# Patient Record
Sex: Male | Born: 1960 | Race: White | Hispanic: No | Marital: Single | Smoking: Former smoker
Health system: Southern US, Community
[De-identification: ages and names within clinical notes are randomized; demographics above are authoritative.]

## PROBLEM LIST (undated history)

## (undated) DIAGNOSIS — G629 Polyneuropathy, unspecified: Secondary | ICD-10-CM

## (undated) DIAGNOSIS — L219 Seborrheic dermatitis, unspecified: Secondary | ICD-10-CM

## (undated) DIAGNOSIS — L409 Psoriasis, unspecified: Secondary | ICD-10-CM

## (undated) DIAGNOSIS — I639 Cerebral infarction, unspecified: Secondary | ICD-10-CM

## (undated) DIAGNOSIS — F29 Unspecified psychosis not due to a substance or known physiological condition: Secondary | ICD-10-CM

## (undated) DIAGNOSIS — F329 Major depressive disorder, single episode, unspecified: Secondary | ICD-10-CM

## (undated) DIAGNOSIS — J45909 Unspecified asthma, uncomplicated: Secondary | ICD-10-CM

## (undated) DIAGNOSIS — F32A Depression, unspecified: Secondary | ICD-10-CM

## (undated) DIAGNOSIS — E11610 Type 2 diabetes mellitus with diabetic neuropathic arthropathy: Secondary | ICD-10-CM

## (undated) DIAGNOSIS — E785 Hyperlipidemia, unspecified: Secondary | ICD-10-CM

## (undated) DIAGNOSIS — E119 Type 2 diabetes mellitus without complications: Secondary | ICD-10-CM

## (undated) DIAGNOSIS — L309 Dermatitis, unspecified: Secondary | ICD-10-CM

## (undated) DIAGNOSIS — D649 Anemia, unspecified: Secondary | ICD-10-CM

## (undated) DIAGNOSIS — F319 Bipolar disorder, unspecified: Secondary | ICD-10-CM

## (undated) DIAGNOSIS — J9690 Respiratory failure, unspecified, unspecified whether with hypoxia or hypercapnia: Secondary | ICD-10-CM

## (undated) DIAGNOSIS — E039 Hypothyroidism, unspecified: Secondary | ICD-10-CM

## (undated) DIAGNOSIS — I10 Essential (primary) hypertension: Secondary | ICD-10-CM

## (undated) DIAGNOSIS — G4733 Obstructive sleep apnea (adult) (pediatric): Secondary | ICD-10-CM

## (undated) HISTORY — PX: TONSILLECTOMY: SUR1361

## (undated) HISTORY — PX: OTHER SURGICAL HISTORY: SHX169

---

## 2003-11-04 ENCOUNTER — Other Ambulatory Visit: Payer: Self-pay

## 2004-08-18 ENCOUNTER — Ambulatory Visit: Payer: Self-pay

## 2004-10-26 ENCOUNTER — Ambulatory Visit: Payer: Self-pay

## 2004-10-26 ENCOUNTER — Ambulatory Visit: Payer: Self-pay | Admitting: Psychiatry

## 2004-11-01 ENCOUNTER — Emergency Department: Payer: Self-pay | Admitting: Emergency Medicine

## 2004-11-12 ENCOUNTER — Ambulatory Visit: Payer: Self-pay

## 2004-11-16 ENCOUNTER — Emergency Department: Payer: Self-pay | Admitting: Emergency Medicine

## 2004-12-07 ENCOUNTER — Emergency Department: Payer: Self-pay | Admitting: General Practice

## 2005-01-26 ENCOUNTER — Ambulatory Visit: Payer: Self-pay | Admitting: Psychiatry

## 2005-02-03 ENCOUNTER — Emergency Department: Payer: Self-pay | Admitting: Unknown Physician Specialty

## 2005-09-13 ENCOUNTER — Ambulatory Visit: Payer: Self-pay | Admitting: Psychiatry

## 2006-02-21 ENCOUNTER — Ambulatory Visit: Payer: Self-pay | Admitting: Family Medicine

## 2006-03-07 ENCOUNTER — Ambulatory Visit: Payer: Self-pay | Admitting: Family Medicine

## 2006-03-10 ENCOUNTER — Other Ambulatory Visit: Payer: Self-pay

## 2006-03-10 ENCOUNTER — Inpatient Hospital Stay: Payer: Self-pay | Admitting: Internal Medicine

## 2006-04-18 ENCOUNTER — Emergency Department: Payer: Self-pay | Admitting: Emergency Medicine

## 2006-09-11 ENCOUNTER — Ambulatory Visit: Payer: Self-pay | Admitting: Internal Medicine

## 2006-09-16 ENCOUNTER — Ambulatory Visit: Payer: Self-pay | Admitting: Internal Medicine

## 2006-12-12 ENCOUNTER — Ambulatory Visit: Payer: Self-pay | Admitting: Internal Medicine

## 2006-12-16 ENCOUNTER — Ambulatory Visit: Payer: Self-pay | Admitting: Internal Medicine

## 2007-02-06 ENCOUNTER — Ambulatory Visit: Payer: Self-pay | Admitting: Internal Medicine

## 2007-02-15 ENCOUNTER — Ambulatory Visit: Payer: Self-pay | Admitting: Internal Medicine

## 2007-04-17 ENCOUNTER — Ambulatory Visit: Payer: Self-pay | Admitting: Internal Medicine

## 2007-04-24 ENCOUNTER — Ambulatory Visit: Payer: Self-pay | Admitting: Internal Medicine

## 2007-05-18 ENCOUNTER — Ambulatory Visit: Payer: Self-pay | Admitting: Internal Medicine

## 2007-06-08 ENCOUNTER — Ambulatory Visit: Payer: Self-pay | Admitting: Family Medicine

## 2007-06-18 ENCOUNTER — Ambulatory Visit: Payer: Self-pay | Admitting: Internal Medicine

## 2007-06-20 ENCOUNTER — Ambulatory Visit: Payer: Self-pay | Admitting: Internal Medicine

## 2007-07-18 ENCOUNTER — Ambulatory Visit: Payer: Self-pay | Admitting: Internal Medicine

## 2007-08-18 ENCOUNTER — Ambulatory Visit: Payer: Self-pay | Admitting: Internal Medicine

## 2007-09-17 ENCOUNTER — Ambulatory Visit: Payer: Self-pay | Admitting: Internal Medicine

## 2007-09-26 ENCOUNTER — Ambulatory Visit: Payer: Self-pay | Admitting: Internal Medicine

## 2007-10-18 ENCOUNTER — Ambulatory Visit: Payer: Self-pay | Admitting: Internal Medicine

## 2007-12-16 ENCOUNTER — Ambulatory Visit: Payer: Self-pay | Admitting: Internal Medicine

## 2007-12-24 ENCOUNTER — Ambulatory Visit: Payer: Self-pay | Admitting: Internal Medicine

## 2008-01-16 ENCOUNTER — Ambulatory Visit: Payer: Self-pay | Admitting: Internal Medicine

## 2008-02-15 ENCOUNTER — Ambulatory Visit: Payer: Self-pay | Admitting: Internal Medicine

## 2008-02-29 ENCOUNTER — Inpatient Hospital Stay: Payer: Self-pay | Admitting: Psychiatry

## 2008-05-08 ENCOUNTER — Ambulatory Visit: Payer: Self-pay | Admitting: Psychiatry

## 2008-05-17 ENCOUNTER — Ambulatory Visit: Payer: Self-pay | Admitting: Internal Medicine

## 2008-05-29 ENCOUNTER — Ambulatory Visit: Payer: Self-pay | Admitting: Psychiatry

## 2008-09-03 ENCOUNTER — Ambulatory Visit: Payer: Self-pay | Admitting: Psychiatry

## 2008-12-17 ENCOUNTER — Ambulatory Visit: Payer: Self-pay | Admitting: Family Medicine

## 2009-01-15 ENCOUNTER — Ambulatory Visit: Payer: Self-pay | Admitting: Family Medicine

## 2009-02-14 ENCOUNTER — Ambulatory Visit: Payer: Self-pay | Admitting: Family Medicine

## 2009-03-17 ENCOUNTER — Ambulatory Visit: Payer: Self-pay | Admitting: Family Medicine

## 2009-09-14 ENCOUNTER — Ambulatory Visit: Payer: Self-pay | Admitting: Internal Medicine

## 2010-09-07 ENCOUNTER — Ambulatory Visit: Payer: Self-pay | Admitting: Internal Medicine

## 2011-02-07 ENCOUNTER — Inpatient Hospital Stay: Payer: Self-pay | Admitting: Internal Medicine

## 2011-10-13 ENCOUNTER — Encounter (HOSPITAL_BASED_OUTPATIENT_CLINIC_OR_DEPARTMENT_OTHER): Payer: Medicare Other | Attending: Internal Medicine

## 2011-10-13 DIAGNOSIS — Z79899 Other long term (current) drug therapy: Secondary | ICD-10-CM | POA: Insufficient documentation

## 2011-10-13 DIAGNOSIS — L408 Other psoriasis: Secondary | ICD-10-CM | POA: Insufficient documentation

## 2011-10-13 DIAGNOSIS — I10 Essential (primary) hypertension: Secondary | ICD-10-CM | POA: Insufficient documentation

## 2011-10-13 DIAGNOSIS — J45909 Unspecified asthma, uncomplicated: Secondary | ICD-10-CM | POA: Insufficient documentation

## 2011-10-13 DIAGNOSIS — E039 Hypothyroidism, unspecified: Secondary | ICD-10-CM | POA: Insufficient documentation

## 2011-10-13 DIAGNOSIS — E1169 Type 2 diabetes mellitus with other specified complication: Secondary | ICD-10-CM | POA: Insufficient documentation

## 2011-10-13 DIAGNOSIS — I872 Venous insufficiency (chronic) (peripheral): Secondary | ICD-10-CM | POA: Insufficient documentation

## 2011-10-13 DIAGNOSIS — L97509 Non-pressure chronic ulcer of other part of unspecified foot with unspecified severity: Secondary | ICD-10-CM | POA: Insufficient documentation

## 2011-10-14 NOTE — Progress Notes (Signed)
[facility]  NAME:  Adrian [name], Adrian [name] NO.:  [id number]  MEDICAL RECORD NO.:  [id number]      DATE OF BIRTH:  [birth year]  PHYSICIAN:  Maxwell Caul, M.D.      VISIT DATE:                                  OFFICE VISIT   CHIEF COMPLAINT:  Review of wound on the left lateral foot.  This is a 49 year old man who is seen for a wound on the left lateral foot.  He states that this came about 3 weeks ago.  In fact, he lives with his mother who happened to notice the area in question.  He apparently was seen by his podiatrist and was put on Keflex, and he is just finishing this up although I do not have a culture result.  He has no prior history of wound-related issues.  He is a type 2 diabetic with a history of diabetic neuropathy.  PAST MEDICAL HISTORY: 1. Type 2 diabetes diagnosed in 2005. 2. Morbid obesity. 3. Hypertension. 4. History of venous insufficiency with venous insufficiency     ulcerations. 5. Seizure disorder. 6. Hypothyroidism. 7. Asthma.  SURGICAL HISTORY:  Tonsillectomy and adenoid removal.  MEDICATION LIST:  Reviewed.  He is currently taking: 1. Keflex 500 three times a day, which he has been on for 4 days. 2. He takes metformin a 1000 b.i.d. 3. K-Dur 20 twice a day. 4. Divalproex 500 b.i.d. 5. Micardis/hydrochlorothiazide 80/25 a day. 6. Geodon 80 mg 2 times a day. 7. Zetia 10 mg daily. 8. Prilosec 20 daily. 9. Torsemide 20 b.i.d. 10.Ambien CR 12.5 at bedtime. 11.Singulair 10 daily. 12.Albuterol 2 puffs q.4. 13.Advair 100/50 one puff twice a day. 14.Spiriva 1 puff daily. 15.ProAir HFA 2 puffs q.6 p.r.n. 16.Verapamil 200 at bedtime. 17.Synthroid 50 q.a.m.  SOCIAL HISTORY:  The patient lives at home with his mother.  He is ambulatory.  REVIEW OF SYSTEMS:  SKIN:  He has apparently widespread psoriasis, for which he is followed by Dermatology and recently has been referred to [facility].  He also has  apparent areas in his pannus including a blister.  RESPIRATORY:  He does not describe excessive shortness of breath.  CARDIAC:  No chest pain.  EXTREMITIES:  He does not describe claudication per se; however, he does have numbness and burning in his feet, which is probably neuropathic pain.  PHYSICAL EXAMINATION:  VITAL SIGNS:  His temperature is 98.7, pulse 125, respirations 22, blood pressure 149/71.  His blood glucose is 115. RESPIRATORY:  Very shallow air entry bilaterally.  However, there are no crackles or wheezes. CARDIAC:  Heart sounds distant.  No increase in JVP.  No sacral edema. EXTREMITIES:  He has severe venous stasis, probable lymphedema.  The edema extends into his feet.  Peripheral pulses were impossible to feel, although his calculated ABI was 0.8 done in this clinic.  WOUND EXAMINATION:  The wound in question is over his left lateral foot roughly mid way.  The wound was debrided off surrounding callus, eschar, and necrotic tissue.  Measurements were 0.8 x 0.5 x 0.2.  A culture of the area was done; however, I did not alter his current antibiotics, which were Keflex as noted above.  IMPRESSIONS:  Wagner's grade 2 diabetic foot wound on the  lateral aspect of his foot.  The area was debrided as noted above.  We applied a silver collagen under a Kerlix Coban wrap.  We have ordered arterial Dopplers of his left leg.  The area in question is a punched-out wound and has some suggestion that this might be an arterial insufficiency ulcer.  We have also cultured the wound and we will await those results before altering antibiotics.  The patient already had a healing sandal to offload this.  I will continue with this.  We will review him again in a week's time.          ______________________________ Maxwell Caul, M.D.     MGR/MEDQ  D:  10/13/2011  T:  10/13/2011  Job:  [id number]

## 2011-10-20 ENCOUNTER — Encounter (HOSPITAL_BASED_OUTPATIENT_CLINIC_OR_DEPARTMENT_OTHER): Payer: Medicare Other | Attending: Internal Medicine

## 2011-10-20 DIAGNOSIS — I10 Essential (primary) hypertension: Secondary | ICD-10-CM | POA: Insufficient documentation

## 2011-10-20 DIAGNOSIS — Z79899 Other long term (current) drug therapy: Secondary | ICD-10-CM | POA: Insufficient documentation

## 2011-10-20 DIAGNOSIS — L97509 Non-pressure chronic ulcer of other part of unspecified foot with unspecified severity: Secondary | ICD-10-CM | POA: Insufficient documentation

## 2011-10-20 DIAGNOSIS — E039 Hypothyroidism, unspecified: Secondary | ICD-10-CM | POA: Insufficient documentation

## 2011-10-20 DIAGNOSIS — E1169 Type 2 diabetes mellitus with other specified complication: Secondary | ICD-10-CM | POA: Insufficient documentation

## 2011-11-10 ENCOUNTER — Encounter (HOSPITAL_BASED_OUTPATIENT_CLINIC_OR_DEPARTMENT_OTHER): Payer: Medicare Other

## 2012-01-11 ENCOUNTER — Encounter: Payer: Self-pay | Admitting: Cardiothoracic Surgery

## 2012-01-11 ENCOUNTER — Encounter: Payer: Self-pay | Admitting: Nurse Practitioner

## 2012-01-16 ENCOUNTER — Encounter: Payer: Self-pay | Admitting: Cardiothoracic Surgery

## 2012-01-16 ENCOUNTER — Encounter: Payer: Self-pay | Admitting: Nurse Practitioner

## 2012-02-15 ENCOUNTER — Encounter: Payer: Self-pay | Admitting: Nurse Practitioner

## 2012-02-15 ENCOUNTER — Encounter: Payer: Self-pay | Admitting: Cardiothoracic Surgery

## 2012-03-17 ENCOUNTER — Encounter: Payer: Self-pay | Admitting: Cardiothoracic Surgery

## 2012-03-17 ENCOUNTER — Encounter: Payer: Self-pay | Admitting: Nurse Practitioner

## 2012-04-16 ENCOUNTER — Encounter: Payer: Self-pay | Admitting: Cardiothoracic Surgery

## 2012-04-16 ENCOUNTER — Encounter: Payer: Self-pay | Admitting: Nurse Practitioner

## 2012-05-12 ENCOUNTER — Emergency Department: Payer: Self-pay | Admitting: *Deleted

## 2012-05-17 ENCOUNTER — Encounter: Payer: Self-pay | Admitting: Nurse Practitioner

## 2012-05-17 ENCOUNTER — Encounter: Payer: Self-pay | Admitting: Cardiothoracic Surgery

## 2012-10-22 ENCOUNTER — Inpatient Hospital Stay: Payer: Self-pay | Admitting: Surgery

## 2012-10-22 LAB — COMPREHENSIVE METABOLIC PANEL
BUN: 13 mg/dL (ref 7–18)
Co2: 29 mmol/L (ref 21–32)
Creatinine: 0.9 mg/dL (ref 0.60–1.30)
Glucose: 100 mg/dL — ABNORMAL HIGH (ref 65–99)
SGOT(AST): 27 U/L (ref 15–37)
Total Protein: 7.1 g/dL (ref 6.4–8.2)

## 2012-10-22 LAB — CBC
HCT: 28.6 % — ABNORMAL LOW (ref 40.0–52.0)
HGB: 9.7 g/dL — ABNORMAL LOW (ref 13.0–18.0)
MCH: 29.8 pg (ref 26.0–34.0)
MCHC: 33.9 g/dL (ref 32.0–36.0)
MCV: 88 fL (ref 80–100)
WBC: 15.3 10*3/uL — ABNORMAL HIGH (ref 3.8–10.6)

## 2012-10-23 LAB — CBC WITH DIFFERENTIAL/PLATELET
Basophil #: 0 10*3/uL (ref 0.0–0.1)
Basophil %: 0.4 %
HCT: 26.7 % — ABNORMAL LOW (ref 40.0–52.0)
Lymphocyte #: 1.5 10*3/uL (ref 1.0–3.6)
Lymphocyte %: 12.7 %
MCH: 29.6 pg (ref 26.0–34.0)
MCHC: 34 g/dL (ref 32.0–36.0)
MCV: 87 fL (ref 80–100)
Monocyte %: 7.6 %
Neutrophil %: 78.4 %
Platelet: 472 10*3/uL — ABNORMAL HIGH (ref 150–440)
RBC: 3.07 10*6/uL — ABNORMAL LOW (ref 4.40–5.90)
RDW: 14.8 % — ABNORMAL HIGH (ref 11.5–14.5)
WBC: 12.2 10*3/uL — ABNORMAL HIGH (ref 3.8–10.6)

## 2012-10-23 LAB — BASIC METABOLIC PANEL
Anion Gap: 9 (ref 7–16)
BUN: 11 mg/dL (ref 7–18)
Calcium, Total: 8.7 mg/dL (ref 8.5–10.1)
Chloride: 104 mmol/L (ref 98–107)
EGFR (African American): >60
Glucose: 110 mg/dL — ABNORMAL HIGH (ref 65–99)
Osmolality: 283 (ref 275–301)
Potassium: 3.5 mmol/L (ref 3.5–5.1)
Sodium: 142 mmol/L (ref 136–145)

## 2012-10-27 LAB — CULTURE, BLOOD (SINGLE)

## 2012-10-27 LAB — WOUND CULTURE

## 2012-11-15 ENCOUNTER — Inpatient Hospital Stay: Payer: Self-pay | Admitting: Internal Medicine

## 2012-11-15 LAB — COMPREHENSIVE METABOLIC PANEL
Albumin: 2.7 g/dL — ABNORMAL LOW (ref 3.4–5.0)
Anion Gap: 19 — ABNORMAL HIGH (ref 7–16)
Bilirubin,Total: 0.7 mg/dL (ref 0.2–1.0)
Co2: 22 mmol/L (ref 21–32)
Creatinine: 1.84 mg/dL — ABNORMAL HIGH (ref 0.60–1.30)
EGFR (African American): 48 — ABNORMAL LOW
Glucose: 101 mg/dL — ABNORMAL HIGH (ref 65–99)
Osmolality: 256 (ref 275–301)
Potassium: 3.2 mmol/L — ABNORMAL LOW (ref 3.5–5.1)
SGPT (ALT): 72 U/L (ref 12–78)
Sodium: 124 mmol/L — ABNORMAL LOW (ref 136–145)

## 2012-11-15 LAB — MAGNESIUM: Magnesium: 1.1 mg/dL — ABNORMAL LOW

## 2012-11-15 LAB — CBC WITH DIFFERENTIAL/PLATELET
Basophil %: 0.1 %
Eosinophil #: 0 10*3/uL (ref 0.0–0.7)
HGB: 9.1 g/dL — ABNORMAL LOW (ref 13.0–18.0)
Lymphocyte %: 3.7 %
MCH: 27.6 pg (ref 26.0–34.0)
Monocyte %: 4 %
Neutrophil #: 23.8 10*3/uL — ABNORMAL HIGH (ref 1.4–6.5)
RBC: 3.31 10*6/uL — ABNORMAL LOW (ref 4.40–5.90)
RDW: 15.3 % — ABNORMAL HIGH (ref 11.5–14.5)
WBC: 25.8 10*3/uL — ABNORMAL HIGH (ref 3.8–10.6)

## 2012-11-15 LAB — CK: CK, Total: 4298 U/L — ABNORMAL HIGH (ref 35–232)

## 2012-11-16 LAB — CBC WITH DIFFERENTIAL/PLATELET
Basophil #: 0 10*3/uL (ref 0.0–0.1)
HCT: 26.7 % — ABNORMAL LOW (ref 40.0–52.0)
HGB: 9.1 g/dL — ABNORMAL LOW (ref 13.0–18.0)
MCH: 28.9 pg (ref 26.0–34.0)
MCHC: 33.9 g/dL (ref 32.0–36.0)
Monocyte %: 6.1 %
RBC: 3.14 10*6/uL — ABNORMAL LOW (ref 4.40–5.90)
RDW: 15 % — ABNORMAL HIGH (ref 11.5–14.5)
WBC: 12.3 10*3/uL — ABNORMAL HIGH (ref 3.8–10.6)

## 2012-11-16 LAB — URINALYSIS, COMPLETE
Bacteria: NONE SEEN
Leukocyte Esterase: NEGATIVE
RBC,UR: 6 /HPF (ref 0–5)
Specific Gravity: 1.005 (ref 1.003–1.030)
Squamous Epithelial: NONE SEEN

## 2012-11-16 LAB — HEPATIC FUNCTION PANEL A (ARMC)
Albumin: 2.3 g/dL — ABNORMAL LOW (ref 3.4–5.0)
Alkaline Phosphatase: 97 U/L (ref 50–136)
Bilirubin, Direct: 0.2 mg/dL (ref 0.00–0.20)
Bilirubin,Total: 0.4 mg/dL (ref 0.2–1.0)
SGOT(AST): 187 U/L — ABNORMAL HIGH (ref 15–37)

## 2012-11-16 LAB — MAGNESIUM: Magnesium: 2.1 mg/dL

## 2012-11-16 LAB — BASIC METABOLIC PANEL
Calcium, Total: 8.1 mg/dL — ABNORMAL LOW (ref 8.5–10.1)
Chloride: 94 mmol/L — ABNORMAL LOW (ref 98–107)
Creatinine: 0.98 mg/dL (ref 0.60–1.30)
EGFR (African American): >60
EGFR (Non-African Amer.): >60
Osmolality: 275 (ref 275–301)

## 2012-11-17 LAB — CBC WITH DIFFERENTIAL/PLATELET
Basophil %: 0.3 %
Eosinophil #: 0.1 10*3/uL (ref 0.0–0.7)
Eosinophil %: 0.5 %
Lymphocyte %: 10.6 %
MCHC: 32.2 g/dL (ref 32.0–36.0)
MCV: 87 fL (ref 80–100)
Monocyte #: 0.7 x10 3/mm (ref 0.2–1.0)
Monocyte %: 5.9 %
Neutrophil #: 9.6 10*3/uL — ABNORMAL HIGH (ref 1.4–6.5)
RDW: 15 % — ABNORMAL HIGH (ref 11.5–14.5)
WBC: 11.6 10*3/uL — ABNORMAL HIGH (ref 3.8–10.6)

## 2012-11-17 LAB — HEPATIC FUNCTION PANEL A (ARMC)
Albumin: 2 g/dL — ABNORMAL LOW (ref 3.4–5.0)
Bilirubin, Direct: <0.1 mg/dL (ref 0.00–0.20)
Bilirubin,Total: 0.3 mg/dL (ref 0.2–1.0)
SGOT(AST): 110 U/L — ABNORMAL HIGH (ref 15–37)
SGPT (ALT): 83 U/L — ABNORMAL HIGH (ref 12–78)

## 2012-11-17 LAB — CK: CK, Total: 824 U/L — ABNORMAL HIGH (ref 35–232)

## 2012-11-17 LAB — CREATININE, SERUM: Creatinine: 0.75 mg/dL (ref 0.60–1.30)

## 2012-11-17 LAB — POTASSIUM: Potassium: 3.5 mmol/L (ref 3.5–5.1)

## 2012-11-18 LAB — CBC WITH DIFFERENTIAL/PLATELET
Basophil #: 0 10*3/uL (ref 0.0–0.1)
Basophil %: 0.4 %
Eosinophil #: 0 10*3/uL (ref 0.0–0.7)
Eosinophil %: 0.3 %
HGB: 9.8 g/dL — ABNORMAL LOW (ref 13.0–18.0)
Lymphocyte #: 0.4 10*3/uL — ABNORMAL LOW (ref 1.0–3.6)
Lymphocyte %: 4 %
MCHC: 33.5 g/dL (ref 32.0–36.0)
MCV: 87 fL (ref 80–100)
Monocyte #: 0.3 x10 3/mm (ref 0.2–1.0)
Monocyte %: 3.4 %
Neutrophil %: 91.9 %
Platelet: 553 10*3/uL — ABNORMAL HIGH (ref 150–440)
RBC: 3.39 10*6/uL — ABNORMAL LOW (ref 4.40–5.90)
RDW: 15.8 % — ABNORMAL HIGH (ref 11.5–14.5)
WBC: 10.1 10*3/uL (ref 3.8–10.6)

## 2012-11-18 LAB — CREATININE, SERUM
EGFR (African American): >60
EGFR (Non-African Amer.): >60

## 2012-11-18 LAB — WOUND AEROBIC CULTURE

## 2012-11-19 LAB — CBC WITH DIFFERENTIAL/PLATELET
Basophil #: 0 10*3/uL (ref 0.0–0.1)
Basophil %: 0.5 %
Eosinophil %: 2.2 %
Lymphocyte #: 1.3 10*3/uL (ref 1.0–3.6)
Lymphocyte %: 17 %
MCH: 28.7 pg (ref 26.0–34.0)
Monocyte %: 9.6 %
Neutrophil #: 5.5 10*3/uL (ref 1.4–6.5)
Neutrophil %: 70.7 %
Platelet: 520 10*3/uL — ABNORMAL HIGH (ref 150–440)
RDW: 15.7 % — ABNORMAL HIGH (ref 11.5–14.5)

## 2012-11-20 LAB — CULTURE, BLOOD (SINGLE)

## 2013-03-14 ENCOUNTER — Inpatient Hospital Stay: Payer: Self-pay | Admitting: Internal Medicine

## 2013-03-14 LAB — URINALYSIS, COMPLETE
Bacteria: NONE SEEN
Glucose,UR: NEGATIVE mg/dL (ref 0–75)
Nitrite: NEGATIVE
Ph: 7 (ref 4.5–8.0)
Protein: NEGATIVE
Squamous Epithelial: NONE SEEN
WBC UR: 2 /HPF (ref 0–5)

## 2013-03-14 LAB — CBC
MCH: 29.6 pg (ref 26.0–34.0)
MCHC: 34.2 g/dL (ref 32.0–36.0)
Platelet: 596 10*3/uL — ABNORMAL HIGH (ref 150–440)
RBC: 3.35 10*6/uL — ABNORMAL LOW (ref 4.40–5.90)
RDW: 14.9 % — ABNORMAL HIGH (ref 11.5–14.5)

## 2013-03-14 LAB — COMPREHENSIVE METABOLIC PANEL
Albumin: 2.8 g/dL — ABNORMAL LOW (ref 3.4–5.0)
Alkaline Phosphatase: 89 U/L (ref 50–136)
Anion Gap: 12 (ref 7–16)
Chloride: 92 mmol/L — ABNORMAL LOW (ref 98–107)
Co2: 26 mmol/L (ref 21–32)
Creatinine: 1.01 mg/dL (ref 0.60–1.30)
EGFR (African American): >60
Potassium: 3.5 mmol/L (ref 3.5–5.1)
SGOT(AST): 25 U/L (ref 15–37)
SGPT (ALT): 38 U/L (ref 12–78)
Total Protein: 7.5 g/dL (ref 6.4–8.2)

## 2013-03-15 LAB — HEMOGLOBIN A1C: Hemoglobin A1C: <3.5 % — ABNORMAL LOW (ref 4.2–6.3)

## 2013-03-15 LAB — BASIC METABOLIC PANEL
Anion Gap: 6 — ABNORMAL LOW (ref 7–16)
Calcium, Total: 8.6 mg/dL (ref 8.5–10.1)
Chloride: 100 mmol/L (ref 98–107)
EGFR (African American): >60
Glucose: 100 mg/dL — ABNORMAL HIGH (ref 65–99)
Osmolality: 277 (ref 275–301)
Potassium: 3.4 mmol/L — ABNORMAL LOW (ref 3.5–5.1)

## 2013-03-15 LAB — CBC WITH DIFFERENTIAL/PLATELET
Basophil #: 0 10*3/uL (ref 0.0–0.1)
Eosinophil #: 0.3 10*3/uL (ref 0.0–0.7)
HCT: 25.7 % — ABNORMAL LOW (ref 40.0–52.0)
Lymphocyte #: 1.4 10*3/uL (ref 1.0–3.6)
MCH: 29.2 pg (ref 26.0–34.0)
MCHC: 34 g/dL (ref 32.0–36.0)
MCV: 86 fL (ref 80–100)
Monocyte %: 7.3 %
Neutrophil #: 10.5 10*3/uL — ABNORMAL HIGH (ref 1.4–6.5)
Neutrophil %: 79.4 %
RBC: 2.99 10*6/uL — ABNORMAL LOW (ref 4.40–5.90)
RDW: 15.1 % — ABNORMAL HIGH (ref 11.5–14.5)

## 2013-03-15 LAB — TSH: Thyroid Stimulating Horm: 3.83 u[IU]/mL

## 2013-03-15 LAB — VANCOMYCIN, TROUGH: Vancomycin, Trough: 12 ug/mL (ref 10–20)

## 2013-03-17 LAB — CBC WITH DIFFERENTIAL/PLATELET
Basophil #: 0 10*3/uL (ref 0.0–0.1)
Basophil %: 0.3 %
Eosinophil #: 0.4 10*3/uL (ref 0.0–0.7)
Eosinophil %: 2.8 %
Lymphocyte %: 12 %
MCH: 29.1 pg (ref 26.0–34.0)
Monocyte #: 0.7 x10 3/mm (ref 0.2–1.0)
Neutrophil #: 10.3 10*3/uL — ABNORMAL HIGH (ref 1.4–6.5)
Platelet: 526 10*3/uL — ABNORMAL HIGH (ref 150–440)
RBC: 3.19 10*6/uL — ABNORMAL LOW (ref 4.40–5.90)
RDW: 14.9 % — ABNORMAL HIGH (ref 11.5–14.5)

## 2013-06-12 ENCOUNTER — Inpatient Hospital Stay: Payer: Self-pay | Admitting: Internal Medicine

## 2013-06-12 LAB — COMPREHENSIVE METABOLIC PANEL
Albumin: 2.1 g/dL — ABNORMAL LOW (ref 3.4–5.0)
Alkaline Phosphatase: 146 U/L — ABNORMAL HIGH (ref 50–136)
Anion Gap: 12 (ref 7–16)
Chloride: 82 mmol/L — ABNORMAL LOW (ref 98–107)
Co2: 27 mmol/L (ref 21–32)
Creatinine: 1.33 mg/dL — ABNORMAL HIGH (ref 0.60–1.30)
EGFR (African American): >60
EGFR (Non-African Amer.): >60
Potassium: 2.7 mmol/L — ABNORMAL LOW (ref 3.5–5.1)
SGOT(AST): 281 U/L — ABNORMAL HIGH (ref 15–37)
SGPT (ALT): 355 U/L — ABNORMAL HIGH (ref 12–78)
Total Protein: 6.9 g/dL (ref 6.4–8.2)

## 2013-06-12 LAB — URINALYSIS, COMPLETE
Bilirubin,UR: NEGATIVE
Nitrite: NEGATIVE
Ph: 5 (ref 4.5–8.0)
Protein: 30
RBC,UR: <1 /HPF (ref 0–5)
WBC UR: 2 /HPF (ref 0–5)

## 2013-06-12 LAB — CBC
HGB: 9.7 g/dL — ABNORMAL LOW (ref 13.0–18.0)
MCH: 28.8 pg (ref 26.0–34.0)
MCHC: 34.9 g/dL (ref 32.0–36.0)
RBC: 3.36 10*6/uL — ABNORMAL LOW (ref 4.40–5.90)
WBC: 18.6 10*3/uL — ABNORMAL HIGH (ref 3.8–10.6)

## 2013-06-13 LAB — CBC WITH DIFFERENTIAL/PLATELET
HCT: 24.9 % — ABNORMAL LOW (ref 40.0–52.0)
HGB: 8.8 g/dL — ABNORMAL LOW (ref 13.0–18.0)
Lymphocyte %: 6.6 %
MCH: 28.7 pg (ref 26.0–34.0)
MCHC: 35.4 g/dL (ref 32.0–36.0)
MCV: 81 fL (ref 80–100)
Platelet: 507 10*3/uL — ABNORMAL HIGH (ref 150–440)
RDW: 14.4 % (ref 11.5–14.5)

## 2013-06-13 LAB — URINE CULTURE

## 2013-06-13 LAB — BASIC METABOLIC PANEL
Anion Gap: 12 (ref 7–16)
Chloride: 89 mmol/L — ABNORMAL LOW (ref 98–107)
Creatinine: 1.13 mg/dL (ref 0.60–1.30)
EGFR (African American): >60
Osmolality: 261 (ref 275–301)
Potassium: 3.1 mmol/L — ABNORMAL LOW (ref 3.5–5.1)

## 2013-06-13 LAB — MAGNESIUM: Magnesium: 1.2 mg/dL — ABNORMAL LOW

## 2013-06-13 LAB — HEPATIC FUNCTION PANEL A (ARMC)
Albumin: 2 g/dL — ABNORMAL LOW (ref 3.4–5.0)
Alkaline Phosphatase: 154 U/L — ABNORMAL HIGH (ref 50–136)
Bilirubin, Direct: 0.2 mg/dL (ref 0.00–0.20)
Bilirubin,Total: 0.5 mg/dL (ref 0.2–1.0)
SGPT (ALT): 305 U/L — ABNORMAL HIGH (ref 12–78)

## 2013-06-14 LAB — BASIC METABOLIC PANEL
Anion Gap: 7 (ref 7–16)
BUN: 16 mg/dL (ref 7–18)
Chloride: 98 mmol/L (ref 98–107)
Co2: 29 mmol/L (ref 21–32)
Creatinine: 0.92 mg/dL (ref 0.60–1.30)
Glucose: 111 mg/dL — ABNORMAL HIGH (ref 65–99)
Osmolality: 270 (ref 275–301)

## 2013-06-14 LAB — CBC WITH DIFFERENTIAL/PLATELET
Basophil %: 0.2 %
Eosinophil %: 0.7 %
HGB: 8.6 g/dL — ABNORMAL LOW (ref 13.0–18.0)
MCH: 28.1 pg (ref 26.0–34.0)
Monocyte #: 0.9 x10 3/mm (ref 0.2–1.0)
Neutrophil #: 12.2 10*3/uL — ABNORMAL HIGH (ref 1.4–6.5)
WBC: 14.5 10*3/uL — ABNORMAL HIGH (ref 3.8–10.6)

## 2013-06-14 LAB — VANCOMYCIN, TROUGH: Vancomycin, Trough: 13 ug/mL (ref 10–20)

## 2013-06-14 LAB — MAGNESIUM: Magnesium: 2 mg/dL

## 2013-06-15 LAB — CBC WITH DIFFERENTIAL/PLATELET
Basophil #: 0.2 10*3/uL — ABNORMAL HIGH (ref 0.0–0.1)
Basophil %: 1.2 %
Eosinophil %: 1.4 %
HCT: 27.5 % — ABNORMAL LOW (ref 40.0–52.0)
HGB: 9.2 g/dL — ABNORMAL LOW (ref 13.0–18.0)
Lymphocyte #: 1.9 10*3/uL (ref 1.0–3.6)
MCH: 28.5 pg (ref 26.0–34.0)
MCHC: 33.5 g/dL (ref 32.0–36.0)
MCV: 85 fL (ref 80–100)
Monocyte #: 0.9 x10 3/mm (ref 0.2–1.0)
Monocyte %: 5.5 %
Neutrophil #: 12.3 10*3/uL — ABNORMAL HIGH (ref 1.4–6.5)
RDW: 14.6 % — ABNORMAL HIGH (ref 11.5–14.5)

## 2013-06-15 LAB — COMPREHENSIVE METABOLIC PANEL
Anion Gap: 6 — ABNORMAL LOW (ref 7–16)
Chloride: 99 mmol/L (ref 98–107)
Creatinine: 0.9 mg/dL (ref 0.60–1.30)
EGFR (African American): >60
EGFR (Non-African Amer.): >60
Potassium: 3.8 mmol/L (ref 3.5–5.1)
SGOT(AST): 70 U/L — ABNORMAL HIGH (ref 15–37)
SGPT (ALT): 174 U/L — ABNORMAL HIGH (ref 12–78)
Total Protein: 7 g/dL (ref 6.4–8.2)

## 2013-06-16 LAB — CBC WITH DIFFERENTIAL/PLATELET
Basophil #: 0.1 10*3/uL (ref 0.0–0.1)
Basophil %: 0.5 %
Eosinophil %: 2.5 %
HGB: 8.5 g/dL — ABNORMAL LOW (ref 13.0–18.0)
Lymphocyte #: 1.7 10*3/uL (ref 1.0–3.6)
MCHC: 33.6 g/dL (ref 32.0–36.0)
MCV: 85 fL (ref 80–100)
Monocyte #: 0.7 x10 3/mm (ref 0.2–1.0)
Monocyte %: 5.1 %
Platelet: 583 10*3/uL — ABNORMAL HIGH (ref 150–440)
RBC: 2.97 10*6/uL — ABNORMAL LOW (ref 4.40–5.90)
RDW: 14.8 % — ABNORMAL HIGH (ref 11.5–14.5)
WBC: 14.4 10*3/uL — ABNORMAL HIGH (ref 3.8–10.6)

## 2013-06-17 LAB — CULTURE, BLOOD (SINGLE)

## 2013-10-04 ENCOUNTER — Inpatient Hospital Stay: Payer: Self-pay | Admitting: Internal Medicine

## 2013-10-04 LAB — CBC
HCT: 33.3 % — ABNORMAL LOW (ref 40.0–52.0)
HGB: 10.6 g/dL — ABNORMAL LOW (ref 13.0–18.0)
MCH: 26.1 pg (ref 26.0–34.0)
MCHC: 31.8 g/dL — ABNORMAL LOW (ref 32.0–36.0)
MCV: 82 fL (ref 80–100)
Platelet: 533 x10 3/mm 3 — ABNORMAL HIGH (ref 150–440)
RBC: 4.05 x10 6/mm 3 — ABNORMAL LOW (ref 4.40–5.90)
RDW: 14.9 % — ABNORMAL HIGH (ref 11.5–14.5)
WBC: 27.6 x10 3/mm 3 — ABNORMAL HIGH (ref 3.8–10.6)

## 2013-10-04 LAB — BASIC METABOLIC PANEL WITH GFR
Anion Gap: 13 (ref 7–16)
BUN: 28 mg/dL — ABNORMAL HIGH (ref 7–18)
Calcium, Total: 9.1 mg/dL (ref 8.5–10.1)
Chloride: 95 mmol/L — ABNORMAL LOW (ref 98–107)
Co2: 19 mmol/L — ABNORMAL LOW (ref 21–32)
Creatinine: 2.15 mg/dL — ABNORMAL HIGH (ref 0.60–1.30)
EGFR (African American): 40 — ABNORMAL LOW
EGFR (Non-African Amer.): 34 — ABNORMAL LOW
Glucose: 120 mg/dL — ABNORMAL HIGH (ref 65–99)
Osmolality: 262 (ref 275–301)
Potassium: 5 mmol/L (ref 3.5–5.1)
Sodium: 127 mmol/L — ABNORMAL LOW (ref 136–145)

## 2013-10-04 LAB — HEMOGLOBIN A1C: Hemoglobin A1C: 5.7 % (ref 4.2–6.3)

## 2013-10-04 LAB — HEPATIC FUNCTION PANEL A (ARMC)
Albumin: 3 g/dL — ABNORMAL LOW (ref 3.4–5.0)
Alkaline Phosphatase: 131 U/L — ABNORMAL HIGH
Bilirubin,Total: 0.4 mg/dL (ref 0.2–1.0)
SGOT(AST): 154 U/L — ABNORMAL HIGH (ref 15–37)
SGPT (ALT): 50 U/L (ref 12–78)
Total Protein: 8.2 g/dL (ref 6.4–8.2)

## 2013-10-04 LAB — URINALYSIS, COMPLETE
Bilirubin,UR: NEGATIVE
Glucose,UR: NEGATIVE mg/dL (ref 0–75)
Ketone: NEGATIVE
Leukocyte Esterase: NEGATIVE
Nitrite: NEGATIVE
Ph: 5 (ref 4.5–8.0)
Protein: 100
RBC,UR: 2 /HPF (ref 0–5)
Specific Gravity: 1.011 (ref 1.003–1.030)
Squamous Epithelial: NONE SEEN
WBC UR: 2 /HPF (ref 0–5)

## 2013-10-04 LAB — AMMONIA: Ammonia, Plasma: 29 umol/L (ref 11–32)

## 2013-10-05 DIAGNOSIS — I10 Essential (primary) hypertension: Secondary | ICD-10-CM | POA: Insufficient documentation

## 2013-10-05 DIAGNOSIS — M869 Osteomyelitis, unspecified: Secondary | ICD-10-CM | POA: Insufficient documentation

## 2013-10-05 DIAGNOSIS — E785 Hyperlipidemia, unspecified: Secondary | ICD-10-CM | POA: Insufficient documentation

## 2013-10-06 LAB — URINE CULTURE

## 2013-10-09 LAB — WOUND CULTURE

## 2013-10-09 LAB — CULTURE, BLOOD (SINGLE)

## 2013-10-10 LAB — WOUND AEROBIC CULTURE

## 2013-10-10 LAB — CULTURE, BLOOD (SINGLE)

## 2014-01-03 ENCOUNTER — Non-Acute Institutional Stay (SKILLED_NURSING_FACILITY): Payer: Medicare Other | Admitting: Internal Medicine

## 2014-01-03 ENCOUNTER — Other Ambulatory Visit: Payer: Self-pay | Admitting: *Deleted

## 2014-01-03 DIAGNOSIS — G4733 Obstructive sleep apnea (adult) (pediatric): Secondary | ICD-10-CM

## 2014-01-03 DIAGNOSIS — E1169 Type 2 diabetes mellitus with other specified complication: Secondary | ICD-10-CM

## 2014-01-03 DIAGNOSIS — L218 Other seborrheic dermatitis: Secondary | ICD-10-CM

## 2014-01-03 DIAGNOSIS — L219 Seborrheic dermatitis, unspecified: Secondary | ICD-10-CM

## 2014-01-03 DIAGNOSIS — M9080 Osteopathy in diseases classified elsewhere, unspecified site: Secondary | ICD-10-CM

## 2014-01-03 DIAGNOSIS — J45909 Unspecified asthma, uncomplicated: Secondary | ICD-10-CM

## 2014-01-03 DIAGNOSIS — M869 Osteomyelitis, unspecified: Secondary | ICD-10-CM

## 2014-01-03 DIAGNOSIS — E1140 Type 2 diabetes mellitus with diabetic neuropathy, unspecified: Secondary | ICD-10-CM | POA: Insufficient documentation

## 2014-01-03 DIAGNOSIS — E1149 Type 2 diabetes mellitus with other diabetic neurological complication: Secondary | ICD-10-CM

## 2014-01-03 DIAGNOSIS — F319 Bipolar disorder, unspecified: Secondary | ICD-10-CM

## 2014-01-03 DIAGNOSIS — E1142 Type 2 diabetes mellitus with diabetic polyneuropathy: Secondary | ICD-10-CM

## 2014-01-03 DIAGNOSIS — E039 Hypothyroidism, unspecified: Secondary | ICD-10-CM

## 2014-01-03 MED ORDER — CLONAZEPAM 0.5 MG PO TABS: ORAL_TABLET | ORAL | Status: DC

## 2014-01-03 MED ORDER — ZOLPIDEM TARTRATE 10 MG PO TABS: ORAL_TABLET | ORAL | Status: DC

## 2014-01-03 NOTE — Telephone Encounter (Signed)
[facility] 

## 2014-01-09 NOTE — Progress Notes (Signed)
Patient ID: Adrian [name], male   DOB: May 15, 1961, 53 y.o.   MRN: [id number]                   HISTORY & PHYSICAL  DATE:  01/03/2014     FACILITY: [facility]    LEVEL OF CARE:   SNF   CHIEF COMPLAINT:  Admission to SNF, post stay at [facility], October 09, 2013 through January 02, 2014.   Previous to this, according to the patient, he was at [facility] and [facility] for an overall set of hospitalizations starting in December 2014.    HISTORY OF PRESENT ILLNESS:  This man tells me that his problem was a left foot ulceration on the lateral aspect of his left foot, which he had been dealing with for several months prior to his admission to the hospital in December.  He was admitted to [facility] with what sounds like a complicated diabetic food wound with underlying osteomyelitis.  He was transferred to [facility] where, according to the patient, physicians recommended a below-knee amputation, which he refused.  He was then sent to [facility] in [place] for completion of antibiotics (exact antibiotics not specifically stated).  His foot wound  apparently completely closed over after six weeks of therapy, only to reopen again, requiring re-initiation of wound care.  He is starting to work with Physical Therapy but has done very little mobilization over the last several months.    Other medical issues include:  Supraventricular tachycardia, requiring initiation of metoprolol.    The patient is a diabetic, treated with metformin.    He also has sleep apnea and he had nocturnal CPAP at [facility].  His status here is uncertain.    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  See discussion above.    Type 2 diabetes.  On metformin.    Morbid obesity.    Asthma.    Hypertension.    Hyperlipidemia.    Supraventricular tachycardia.    Diabetic neuropathy.    Hypothyroidism.    Bipolar disorder.    Obstructive  sleep apnea.    CURRENT MEDICATIONS:  Medication list is reviewed.    SOCIAL HISTORY:   HOUSING:  The patient tells me he was living in [place] in his family home.   FUNCTIONAL STATUS:  He has a walker which he used to ambulate.  He does have a wheelchair, although the wheelchair was too big for the house, according to him.   TOBACCO USE:  He is a nonsmoker.    FAMILY HISTORY:  No history of diabetic foot wounds.    REVIEW OF SYSTEMS:   CHEST/RESPIRATORY:  He is not complaining of wheezing or shortness of breath.   CARDIAC:   No complaints of chest pain.   GI:  No bowel issues.  No abdominal pain.   GU:  Says he has urinary frequency, but no dysuria.   SKIN:  Extremities:  He does not have a history of diabetic foot wounds although he clarified that, but it is clear that this is the worst of his episodes.    PHYSICAL EXAMINATION:   VITAL SIGNS:   PULSE:  72.   RESPIRATIONS:  18.   GENERAL APPEARANCE:  Morbidly obese man in no distress.   HEENT:   MOUTH/THROAT:   Very poor dentition.  He will need orthodontic work here, but nothing seems acute.  CHEST/RESPIRATORY:  Clear air entry bilaterally.  CARDIOVASCULAR:  CARDIAC:   Heart sounds are distant.  There are no murmurs.  No evidence of heart failure.   GASTROINTESTINAL:  ABDOMEN:   Morbidly obese.  No masses or tenderness noted.   GENITOURINARY:  BLADDER:   No suprapubic fullness or tenderness, although this would be difficult to tell with his body habitus.     CIRCULATION:  EDEMA/VARICOSITIES:  Extremities:  He has some degree of venous stasis.   SKIN:  INSPECTION:  LEFT FOOT:  The area over his left lateral foot would have been at the base of his fifth metatarsal.  This has closed over.  However, the area does not look particularly vibrant to me.  The possibility here of continued osteomyelitis is there.  I have discussed this with him.  SCALP:  On his scalp, he has clear seborrheic dermatitis, maybe some evidence of scalp  psoriasis.   NEUROLOGICAL:    SENSATION/STRENGTH:  He does have antigravity strength.   DEEP TENDON REFLEXES:  He is diffusely hyporeflexic.   PSYCHIATRIC:   MENTAL STATUS:   I see no evidence of a particular problem here.  No bipolar issues at the moment.    ASSESSMENT/PLAN:  Diabetic osteomyelitis.  The wound has closed over with epithelialization complete.  However, this area will need watching.  He has already had one reoccurrence in [facility].  The possibility of continued osteomyelitis here is certainly there, even given the antibiotics he has received and the resultant closure of the original wound.    Diabetic neuropathy.    History of asthma.  I do not see any evidence of this at the bedside.    Supraventricular tachycardia.  Controlled on metoprolol.  His pulse rate seems regular and in sinus rhythm.    Hypertension.  We will need to monitor this while he is here.    Sleep apnea.  They gave him CPAP at [facility].  I will need to check if we have that here.    Bipolar disorder.  i see very little evidence of this.  He remains on Geodon and clonazepam.  I see no particular evidence that this is active at the moment.    We will continue to monitor his CBGs.    CPT CODE: 21308

## 2014-01-15 ENCOUNTER — Emergency Department (HOSPITAL_COMMUNITY): Payer: Medicare Other

## 2014-01-15 ENCOUNTER — Encounter (HOSPITAL_COMMUNITY): Payer: Self-pay | Admitting: Emergency Medicine

## 2014-01-15 ENCOUNTER — Inpatient Hospital Stay (HOSPITAL_COMMUNITY)
Admission: EM | Admit: 2014-01-15 | Discharge: 2014-01-17 | DRG: 690 | Disposition: A | Discharge status: Skilled Nursing Facility | Payer: Medicare Other | Attending: Internal Medicine | Admitting: Internal Medicine

## 2014-01-15 DIAGNOSIS — J45909 Unspecified asthma, uncomplicated: Secondary | ICD-10-CM | POA: Diagnosis present

## 2014-01-15 DIAGNOSIS — G988 Other disorders of nervous system: Secondary | ICD-10-CM | POA: Diagnosis present

## 2014-01-15 DIAGNOSIS — G4733 Obstructive sleep apnea (adult) (pediatric): Secondary | ICD-10-CM | POA: Diagnosis present

## 2014-01-15 DIAGNOSIS — L97509 Non-pressure chronic ulcer of other part of unspecified foot with unspecified severity: Secondary | ICD-10-CM | POA: Diagnosis present

## 2014-01-15 DIAGNOSIS — E785 Hyperlipidemia, unspecified: Secondary | ICD-10-CM | POA: Diagnosis present

## 2014-01-15 DIAGNOSIS — Z6841 Body Mass Index (BMI) 40.0 and over, adult: Secondary | ICD-10-CM

## 2014-01-15 DIAGNOSIS — Z79899 Other long term (current) drug therapy: Secondary | ICD-10-CM

## 2014-01-15 DIAGNOSIS — E669 Obesity, unspecified: Secondary | ICD-10-CM | POA: Diagnosis present

## 2014-01-15 DIAGNOSIS — R4182 Altered mental status, unspecified: Secondary | ICD-10-CM | POA: Diagnosis present

## 2014-01-15 DIAGNOSIS — E119 Type 2 diabetes mellitus without complications: Secondary | ICD-10-CM | POA: Diagnosis present

## 2014-01-15 DIAGNOSIS — N390 Urinary tract infection, site not specified: Principal | ICD-10-CM | POA: Diagnosis present

## 2014-01-15 DIAGNOSIS — F319 Bipolar disorder, unspecified: Secondary | ICD-10-CM | POA: Diagnosis present

## 2014-01-15 DIAGNOSIS — M869 Osteomyelitis, unspecified: Secondary | ICD-10-CM | POA: Diagnosis present

## 2014-01-15 DIAGNOSIS — I10 Essential (primary) hypertension: Secondary | ICD-10-CM | POA: Diagnosis present

## 2014-01-15 DIAGNOSIS — E039 Hypothyroidism, unspecified: Secondary | ICD-10-CM | POA: Diagnosis present

## 2014-01-15 HISTORY — DX: Essential (primary) hypertension: I10

## 2014-01-15 HISTORY — DX: Unspecified asthma, uncomplicated: J45.909

## 2014-01-15 HISTORY — DX: Type 2 diabetes mellitus without complications: E11.9

## 2014-01-15 HISTORY — DX: Major depressive disorder, single episode, unspecified: F32.9

## 2014-01-15 HISTORY — DX: Hyperlipidemia, unspecified: E78.5

## 2014-01-15 HISTORY — DX: Depression, unspecified: F32.A

## 2014-01-15 HISTORY — DX: Hypothyroidism, unspecified: E03.9

## 2014-01-15 HISTORY — DX: Obstructive sleep apnea (adult) (pediatric): G47.33

## 2014-01-15 HISTORY — DX: Bipolar disorder, unspecified: F31.9

## 2014-01-15 LAB — CBC WITH DIFFERENTIAL/PLATELET
BASOS ABS: 0 10*3/uL (ref 0.0–0.1)
Basophils Relative: 0 % (ref 0–1)
EOS ABS: 0 10*3/uL (ref 0.0–0.7)
Eosinophils Relative: 0 % (ref 0–5)
HEMATOCRIT: 30.3 % — AB (ref 39.0–52.0)
HEMOGLOBIN: 10.3 g/dL — AB (ref 13.0–17.0)
Lymphocytes Relative: 18 % (ref 12–46)
Lymphs Abs: 2.5 10*3/uL (ref 0.7–4.0)
MCH: 29.7 pg (ref 26.0–34.0)
MCHC: 34 g/dL (ref 30.0–36.0)
MCV: 87.3 fL (ref 78.0–100.0)
MONO ABS: 1.8 10*3/uL — AB (ref 0.1–1.0)
MONOS PCT: 13 % — AB (ref 3–12)
Neutro Abs: 9.7 10*3/uL — ABNORMAL HIGH (ref 1.7–7.7)
Neutrophils Relative %: 69 % (ref 43–77)
Platelets: 249 10*3/uL (ref 150–400)
RBC: 3.47 MIL/uL — ABNORMAL LOW (ref 4.22–5.81)
RDW: 15.8 % — ABNORMAL HIGH (ref 11.5–15.5)
WBC: 14 10*3/uL — ABNORMAL HIGH (ref 4.0–10.5)

## 2014-01-15 NOTE — ED Provider Notes (Signed)
CSN: [id number]     Arrival date & time 01/15/14  2241 History   First MD Initiated Contact with Patient 01/15/14 2305     Chief Complaint  Patient presents with  . Altered Mental Status     (Consider location/radiation/quality/duration/timing/severity/associated sxs/prior Treatment) HPI Comments: Patient is a 53 year old male with history of diabetes, obesity, bipolar disorder. He was sent here from [facility] for evaluation of fever. He has been there for several weeks for treatment of a foot ulcer. This appears to be improving. Today staff noted him to be less responsive than normal and apparently running a fever of 102. He was given Tylenol and sent here for further evaluation. He denies to me he is having any symptoms.  Patient is a 53 y.o. male presenting with altered mental status. The history is provided by the patient.  Altered Mental Status Presenting symptoms: lethargy and partial responsiveness   Severity:  Moderate Most recent episode:  Today Episode history:  Single Timing:  Constant Progression:  Unchanged Chronicity:  New Associated symptoms: fever     Past Medical History  Diagnosis Date  . Asthma   . Hypertension   . Hyperlipemia   . Diabetes mellitus without complication   . Hypothyroidism   . Bipolar disorder   . Sleep apnea, obstructive    Past Surgical History  Procedure Laterality Date  . Tonsillectomy     No family history on file. History  Substance Use Topics  . Smoking status: Never Smoker   . Smokeless tobacco: Not on file  . Alcohol Use: No    Review of Systems  Constitutional: Positive for fever and fatigue.  All other systems reviewed and are negative.      Allergies  Review of patient's allergies indicates not on file.  Home Medications   Current Outpatient Rx  Name  Route  Sig  Dispense  Refill  . clonazePAM (KLONOPIN) 0.5 MG tablet      Take one tablet by mouth twice daily   60 tablet   5   . zolpidem (AMBIEN)  10 MG tablet      Take one tablet by mouth every night at bedtime as needed for rest   30 tablet   5    BP 140/67  Pulse 85  Temp(Src) 98.6 F (37 C) (Oral)  Resp 18  Ht 5\' 11"  (1.803 m)  Wt 341 lb (154.677 kg)  BMI 47.58 kg/m2  SpO2 96% Physical Exam  Nursing note and vitals reviewed. Constitutional: He is oriented to person, place, and time.  Patient is an obese, chronically ill-appearing 53 year old male. He is somnolent, however is arousable and appropriate.  HENT:  Head: Normocephalic and atraumatic.  Mouth/Throat: Oropharynx is clear and moist.  Eyes: EOM are normal. Pupils are equal, round, and reactive to light.  Neck: Normal range of motion. Neck supple.  Cardiovascular: Normal rate, regular rhythm and normal heart sounds.   No murmur heard. Pulmonary/Chest: Effort normal and breath sounds normal. No respiratory distress. He has no wheezes.  Abdominal: Soft. Bowel sounds are normal. He exhibits no distension and no mass. There is no tenderness.  Abdomen is obese.  Musculoskeletal: Normal range of motion. He exhibits no edema.  The left foot is noted to have a healing ulcer to the lateral aspect. There is no surrounding erythema or warmth.  Neurological: He is alert and oriented to person, place, and time. No cranial nerve deficit. Coordination normal.  Skin: Skin is warm and dry.  ED Course  Procedures (including critical care time) Labs Review Labs Reviewed  CULTURE, BLOOD (ROUTINE X 2)  CULTURE, BLOOD (ROUTINE X 2)  CBC WITH DIFFERENTIAL  COMPREHENSIVE METABOLIC PANEL  URINALYSIS, ROUTINE W REFLEX MICROSCOPIC   Imaging Review No results found.   EKG Interpretation None      MDM   Final diagnoses:  None    Patient is a 53 year old male with history of obesity, diabetes. He was sent here for evaluation of altered mental status and fever. He's been quite somnolent today and was found to have a fever of 102. Workup today reveals an elevated white  count and urinary tract infection. I suspect this is the etiology behind his symptoms. Blood cultures were obtained and are pending. Chest x-ray was performed and did not show any acute process. He was given ceftriaxone and will be admitted to the internal medicine service. Dr. Julian Reil agrees to admit.    Geoffery Lyons, MD 01/16/14 [phone]

## 2014-01-15 NOTE — ED Notes (Signed)
Phlebotomy at bedside.

## 2014-01-15 NOTE — ED Notes (Signed)
Patient presents to ED via [facility]. Patient is from [facility] where staff called c/o patient having "altered mental status and fever." Upon EMS arrival patient was c/o of being "hot." Staff at [facility] had given patient "tylenol at 8pm." Pt is currently A&Ox4. No complaints at this time.

## 2014-01-15 NOTE — ED Notes (Signed)
Dr. Delo at bedside. 

## 2014-01-16 ENCOUNTER — Encounter (HOSPITAL_COMMUNITY): Payer: Self-pay | Admitting: General Practice

## 2014-01-16 DIAGNOSIS — N390 Urinary tract infection, site not specified: Principal | ICD-10-CM | POA: Diagnosis present

## 2014-01-16 DIAGNOSIS — R4182 Altered mental status, unspecified: Secondary | ICD-10-CM

## 2014-01-16 LAB — URINALYSIS, ROUTINE W REFLEX MICROSCOPIC
Bilirubin Urine: NEGATIVE
GLUCOSE, UA: NEGATIVE mg/dL
Ketones, ur: NEGATIVE mg/dL
Nitrite: POSITIVE — AB
PH: 5.5 (ref 5.0–8.0)
PROTEIN: 30 mg/dL — AB
Specific Gravity, Urine: 1.013 (ref 1.005–1.030)
Urobilinogen, UA: 0.2 mg/dL (ref 0.0–1.0)

## 2014-01-16 LAB — COMPREHENSIVE METABOLIC PANEL
ALT: 21 U/L (ref 0–53)
AST: 19 U/L (ref 0–37)
Albumin: 2.9 g/dL — ABNORMAL LOW (ref 3.5–5.2)
Alkaline Phosphatase: 111 U/L (ref 39–117)
BUN: 14 mg/dL (ref 6–23)
CALCIUM: 8.2 mg/dL — AB (ref 8.4–10.5)
CO2: 22 mEq/L (ref 19–32)
Chloride: 99 mEq/L (ref 96–112)
Creatinine, Ser: 1.26 mg/dL (ref 0.50–1.35)
GFR calc Af Amer: 74 mL/min — ABNORMAL LOW (ref >90)
GFR calc non Af Amer: 64 mL/min — ABNORMAL LOW (ref >90)
Glucose, Bld: 115 mg/dL — ABNORMAL HIGH (ref 70–99)
Potassium: 2.9 mEq/L — CL (ref 3.7–5.3)
Sodium: 140 mEq/L (ref 137–147)
TOTAL PROTEIN: 6.1 g/dL (ref 6.0–8.3)
Total Bilirubin: 0.2 mg/dL — ABNORMAL LOW (ref 0.3–1.2)

## 2014-01-16 LAB — MRSA PCR SCREENING: MRSA BY PCR: POSITIVE — AB

## 2014-01-16 LAB — URINE MICROSCOPIC-ADD ON

## 2014-01-16 LAB — GLUCOSE, CAPILLARY
GLUCOSE-CAPILLARY: 79 mg/dL (ref 70–99)
GLUCOSE-CAPILLARY: 110 mg/dL — AB (ref 70–99)
GLUCOSE-CAPILLARY: 107 mg/dL — AB (ref 70–99)
GLUCOSE-CAPILLARY: 99 mg/dL (ref 70–99)
Glucose-Capillary: 105 mg/dL — ABNORMAL HIGH (ref 70–99)

## 2014-01-16 MED ORDER — VERAPAMIL HCL ER 120 MG PO TBCR
120.0000 mg | EXTENDED_RELEASE_TABLET | Freq: Every day | ORAL | Status: DC
  Administered 2014-01-16: 120 mg via ORAL
  Filled 2014-01-16 (×2): qty 1

## 2014-01-16 MED ORDER — POTASSIUM CHLORIDE CRYS ER 20 MEQ PO TBCR
40.0000 meq | EXTENDED_RELEASE_TABLET | Freq: Once | ORAL | Status: AC
  Administered 2014-01-16: 40 meq via ORAL
  Filled 2014-01-16: qty 2

## 2014-01-16 MED ORDER — OXCARBAZEPINE 300 MG PO TABS
600.0000 mg | ORAL_TABLET | Freq: Two times a day (BID) | ORAL | Status: DC
  Administered 2014-01-16 – 2014-01-17 (×3): 600 mg via ORAL
  Filled 2014-01-16 (×4): qty 2

## 2014-01-16 MED ORDER — GABAPENTIN 300 MG PO CAPS
300.0000 mg | ORAL_CAPSULE | Freq: Four times a day (QID) | ORAL | Status: DC
  Administered 2014-01-16 – 2014-01-17 (×6): 300 mg via ORAL
  Filled 2014-01-16 (×8): qty 1

## 2014-01-16 MED ORDER — MOMETASONE FURO-FORMOTEROL FUM 100-5 MCG/ACT IN AERO
2.0000 | INHALATION_SPRAY | Freq: Two times a day (BID) | RESPIRATORY_TRACT | Status: DC
  Administered 2014-01-16 – 2014-01-17 (×3): 2 via RESPIRATORY_TRACT
  Filled 2014-01-16: qty 8.8

## 2014-01-16 MED ORDER — BIOTENE DRY MOUTH MT LIQD
15.0000 mL | Freq: Two times a day (BID) | OROMUCOSAL | Status: DC
  Administered 2014-01-16 – 2014-01-17 (×3): 15 mL via OROMUCOSAL

## 2014-01-16 MED ORDER — ENOXAPARIN SODIUM 40 MG/0.4ML ~~LOC~~ SOLN
40.0000 mg | SUBCUTANEOUS | Status: DC
  Administered 2014-01-16 – 2014-01-17 (×2): 40 mg via SUBCUTANEOUS
  Filled 2014-01-16 (×2): qty 0.4

## 2014-01-16 MED ORDER — MUPIROCIN 2 % EX OINT
TOPICAL_OINTMENT | Freq: Two times a day (BID) | CUTANEOUS | Status: DC
  Administered 2014-01-16: 1 via NASAL
  Administered 2014-01-16 – 2014-01-17 (×2): via NASAL
  Filled 2014-01-16: qty 22

## 2014-01-16 MED ORDER — MAGNESIUM HYDROXIDE 400 MG/5ML PO SUSP
30.0000 mL | Freq: Every day | ORAL | Status: DC
  Administered 2014-01-16 – 2014-01-17 (×2): 30 mL via ORAL
  Filled 2014-01-16 (×2): qty 30

## 2014-01-16 MED ORDER — MONTELUKAST SODIUM 10 MG PO TABS
10.0000 mg | ORAL_TABLET | Freq: Every day | ORAL | Status: DC
  Administered 2014-01-16 – 2014-01-17 (×2): 10 mg via ORAL
  Filled 2014-01-16 (×2): qty 1

## 2014-01-16 MED ORDER — ZOLPIDEM TARTRATE 5 MG PO TABS
5.0000 mg | ORAL_TABLET | Freq: Once | ORAL | Status: AC
  Administered 2014-01-16: 5 mg via ORAL
  Filled 2014-01-16: qty 1

## 2014-01-16 MED ORDER — DEXTROSE 5 % IV SOLN
1.0000 g | INTRAVENOUS | Status: DC
  Administered 2014-01-17: 1 g via INTRAVENOUS
  Filled 2014-01-16 (×2): qty 10

## 2014-01-16 MED ORDER — ZIPRASIDONE HCL 60 MG PO CAPS
60.0000 mg | ORAL_CAPSULE | Freq: Two times a day (BID) | ORAL | Status: DC
  Administered 2014-01-16 – 2014-01-17 (×3): 60 mg via ORAL
  Filled 2014-01-16 (×5): qty 1

## 2014-01-16 MED ORDER — LEVOTHYROXINE SODIUM 75 MCG PO TABS
75.0000 ug | ORAL_TABLET | Freq: Every day | ORAL | Status: DC
  Administered 2014-01-16 – 2014-01-17 (×2): 75 ug via ORAL
  Filled 2014-01-16 (×3): qty 1

## 2014-01-16 MED ORDER — TORSEMIDE 20 MG PO TABS
20.0000 mg | ORAL_TABLET | Freq: Every day | ORAL | Status: DC
  Administered 2014-01-16 – 2014-01-17 (×2): 20 mg via ORAL
  Filled 2014-01-16 (×2): qty 1

## 2014-01-16 MED ORDER — ACETAMINOPHEN 325 MG PO TABS: 650.0000 mg | ORAL_TABLET | Freq: Four times a day (QID) | ORAL | Status: DC | PRN

## 2014-01-16 MED ORDER — INSULIN ASPART 100 UNIT/ML ~~LOC~~ SOLN: 0.0000 [IU] | Freq: Three times a day (TID) | SUBCUTANEOUS | Status: DC

## 2014-01-16 MED ORDER — TIOTROPIUM BROMIDE MONOHYDRATE 18 MCG IN CAPS
18.0000 ug | ORAL_CAPSULE | Freq: Every day | RESPIRATORY_TRACT | Status: DC
  Administered 2014-01-16 – 2014-01-17 (×2): 18 ug via RESPIRATORY_TRACT
  Filled 2014-01-16: qty 5

## 2014-01-16 MED ORDER — DEXTROSE 5 % IV SOLN
1.0000 g | Freq: Once | INTRAVENOUS | Status: AC
  Administered 2014-01-16: 1 g via INTRAVENOUS
  Filled 2014-01-16: qty 10

## 2014-01-16 MED ORDER — CLONAZEPAM 0.5 MG PO TABS
0.5000 mg | ORAL_TABLET | Freq: Two times a day (BID) | ORAL | Status: DC
  Administered 2014-01-16 – 2014-01-17 (×3): 0.5 mg via ORAL
  Filled 2014-01-16 (×3): qty 1

## 2014-01-16 MED ORDER — METOPROLOL TARTRATE 25 MG PO TABS
25.0000 mg | ORAL_TABLET | Freq: Two times a day (BID) | ORAL | Status: DC
  Administered 2014-01-16 – 2014-01-17 (×3): 25 mg via ORAL
  Filled 2014-01-16 (×5): qty 1

## 2014-01-16 MED ORDER — CHLORHEXIDINE GLUCONATE CLOTH 2 % EX PADS
6.0000 | MEDICATED_PAD | Freq: Every day | CUTANEOUS | Status: DC
  Administered 2014-01-16 – 2014-01-17 (×2): 6 via TOPICAL

## 2014-01-16 MED ORDER — ALUM & MAG HYDROXIDE-SIMETH 200-200-20 MG/5ML PO SUSP: 30.0000 mL | ORAL | Status: DC | PRN

## 2014-01-16 MED ORDER — SENNA-DOCUSATE SODIUM 8.6-50 MG PO TABS
1.0000 | ORAL_TABLET | Freq: Two times a day (BID) | ORAL | Status: DC
  Administered 2014-01-16 – 2014-01-17 (×3): 1 via ORAL
  Filled 2014-01-16 (×5): qty 1

## 2014-01-16 MED ORDER — METFORMIN HCL 500 MG PO TABS
1000.0000 mg | ORAL_TABLET | Freq: Two times a day (BID) | ORAL | Status: DC
  Administered 2014-01-16 – 2014-01-17 (×3): 1000 mg via ORAL
  Filled 2014-01-16 (×5): qty 2

## 2014-01-16 MED ORDER — POTASSIUM CHLORIDE 10 MEQ/100ML IV SOLN
10.0000 meq | Freq: Once | INTRAVENOUS | Status: AC
  Administered 2014-01-16: 10 meq via INTRAVENOUS
  Filled 2014-01-16: qty 100

## 2014-01-16 NOTE — ED Notes (Signed)
Admitting physician at bedside

## 2014-01-16 NOTE — Progress Notes (Signed)
Patient seen earlier today by my colleague Dr. Julian ReilGardner.  Patient seen examined by me, notes and data base reviewed. 52 years staying at nursing home resident because of recent lower extremity osteomyelitis Presented with altered mental status likely secondary to UTI. Patient is on Rocephin, also needs potassium replacement.  Adrian [name] Pager: [phone]: [phone] 01/16/2014, 11:51 AM

## 2014-01-16 NOTE — Progress Notes (Signed)
Received report from Holly, RN.

## 2014-01-16 NOTE — ED Notes (Signed)
[facility] is requesting a bariatric bed at this time. Leonard Schwartzramaine, RN on [facility] will call this RN when able to transport patient upstairs.

## 2014-01-16 NOTE — ED Notes (Signed)
Dr. Delo at bedside. 

## 2014-01-16 NOTE — ED Notes (Signed)
[facility] called stating that the bed was ready. Transporting patient to [facility] at this time.

## 2014-01-16 NOTE — Progress Notes (Signed)
UR completed. Patient changed to inpatient- requiring IV antibiotics.  

## 2014-01-16 NOTE — H&P (Signed)
[facility] History and Physical  Jacki ConesCarlton Kesselman WUJ:[id number] DOB: 1961/04/23 DOA: 01/15/2014  Referring physician: EDP PCP: Dennison MascotMORRISEY,LEMONT, MD   Chief Complaint: AMS, fever   HPI: Jacki ConesCarlton Buttacavoli is a 53 y.o. male who is sent in from his NH at [facility] for new onset fever today as well as altered mental status.  Fever of 102 noted per NH report, patient was noted to be "less responsive" per staff.  On arrival to the ED, patient is sleepy, but with enough stimulation, wakes up, is AAOx4 (although somewhat angry about being woken up), and states that he has no symptoms, before promptly going back to sleep (or just ignoring me).  Review of Systems: Systems reviewed.  As above, otherwise negative  Past Medical History  Diagnosis Date  . Asthma   . Hypertension   . Hyperlipemia   . Diabetes mellitus without complication   . Hypothyroidism   . Bipolar disorder   . Sleep apnea, obstructive    Past Surgical History  Procedure Laterality Date  . Tonsillectomy     Social History:  reports that he has never smoked. He does not have any smokeless tobacco history on file. He reports that he does not drink alcohol or use illicit drugs.  No Known Allergies  No family history on file.   Prior to Admission medications   Medication Sig Start Date End Date Taking? Authorizing Provider  acetaminophen (TYLENOL) 325 MG tablet Take 650 mg by mouth every 6 (six) hours as needed for mild pain.   Yes Historical Provider, MD  alum & mag hydroxide-simeth (MAALOX/MYLANTA) 200-200-20 MG/5ML suspension Take 30 mLs by mouth every 4 (four) hours as needed for indigestion or heartburn.   Yes Historical Provider, MD  clonazePAM (KLONOPIN) 0.5 MG tablet Take one tablet by mouth twice daily 01/03/14  Yes Tiffany L Reed, DO  enoxaparin (LOVENOX) 40 MG/0.4ML injection Inject 40 mg into the skin daily.   Yes Historical Provider, MD  Fluticasone-Salmeterol (ADVAIR) 250-50 MCG/DOSE AEPB Inhale 1 puff into  the lungs 2 (two) times daily.   Yes Historical Provider, MD  gabapentin (NEURONTIN) 300 MG capsule Take 300 mg by mouth 4 (four) times daily.   Yes Historical Provider, MD  guaifenesin (ROBITUSSIN) 100 MG/5ML syrup Take 200 mg by mouth 3 (three) times daily as needed for cough.   Yes Historical Provider, MD  levothyroxine (SYNTHROID, LEVOTHROID) 75 MCG tablet Take 75 mcg by mouth daily before breakfast.   Yes Historical Provider, MD  loratadine (CLARITIN) 10 MG tablet Take 10 mg by mouth daily.   Yes Historical Provider, MD  magnesium hydroxide (MILK OF MAGNESIA) 400 MG/5ML suspension Take 30 mLs by mouth daily.   Yes Historical Provider, MD  metFORMIN (GLUCOPHAGE) 1000 MG tablet Take 1,000 mg by mouth 2 (two) times daily with a meal.   Yes Historical Provider, MD  metoprolol tartrate (LOPRESSOR) 25 MG tablet Take 25 mg by mouth 2 (two) times daily.   Yes Historical Provider, MD  montelukast (SINGULAIR) 10 MG tablet Take 10 mg by mouth daily.   Yes Historical Provider, MD  oxcarbazepine (TRILEPTAL) 600 MG tablet Take 600 mg by mouth 2 (two) times daily.   Yes Historical Provider, MD  sennosides-docusate sodium (SENOKOT-S) 8.6-50 MG tablet Take 1 tablet by mouth 2 (two) times daily.   Yes Historical Provider, MD  simethicone (MYLICON) 125 MG chewable tablet Chew 125 mg by mouth every 6 (six) hours as needed for flatulence.   Yes Historical Provider, MD  tiotropium (  SPIRIVA) 18 MCG inhalation capsule Place 18 mcg into inhaler and inhale daily.   Yes Historical Provider, MD  torsemide (DEMADEX) 20 MG tablet Take 20 mg by mouth daily.   Yes Historical Provider, MD  verapamil (CALAN-SR) 120 MG CR tablet Take 120 mg by mouth at bedtime.   Yes Historical Provider, MD  ziprasidone (GEODON) 60 MG capsule Take 60 mg by mouth 2 (two) times daily with a meal.   Yes Historical Provider, MD  zolpidem (AMBIEN) 10 MG tablet Take one tablet by mouth every night at bedtime as needed for rest 01/03/14  Yes Kermit Balo, DO   Physical Exam: Filed Vitals:   01/16/14 0300  BP: 107/58  Pulse: 81  Temp:   Resp: 16    BP 107/58  Pulse 81  Temp(Src) 98.6 F (37 C) (Oral)  Resp 16  Ht 5\' 11"  (1.803 m)  Wt 154.677 kg (341 lb)  BMI 47.58 kg/m2  SpO2 98%  General Appearance:    Alert, oriented, no distress, appears stated age, wakes up with enough stimulation, able to correctly answer all questions,  Head:    Normocephalic, atraumatic  Eyes:    PERRL, EOMI, sclera non-icteric        Nose:   Nares without drainage or epistaxis. Mucosa, turbinates normal  Throat:   Moist mucous membranes. Oropharynx without erythema or exudate.  Neck:   Supple. No carotid bruits.  No thyromegaly.  No lymphadenopathy.   Back:     No CVA tenderness, no spinal tenderness  Lungs:     Clear to auscultation bilaterally, without wheezes, rhonchi or rales  Chest wall:    No tenderness to palpitation  Heart:    Regular rate and rhythm without murmurs, gallops, rubs  Abdomen:     Soft, non-tender, nondistended, normal bowel sounds, no organomegaly  Genitalia:    deferred  Rectal:    deferred  Extremities:   No clubbing, cyanosis or edema.  Pulses:   2+ and symmetric all extremities  Skin:   Skin color, texture, turgor normal, no rashes or lesions  Lymph nodes:   Cervical, supraclavicular, and axillary nodes normal  Neurologic:   CNII-XII intact. Normal strength, sensation and reflexes      throughout    Labs on Admission:  Basic Metabolic Panel:  Recent Labs Lab 01/15/14 2330  NA 140  K 2.9*  CL 99  CO2 22  GLUCOSE 115*  BUN 14  CREATININE 1.26  CALCIUM 8.2*   Liver Function Tests:  Recent Labs Lab 01/15/14 2330  AST 19  ALT 21  ALKPHOS 111  BILITOT 0.2*  PROT 6.1  ALBUMIN 2.9*   No results found for this basename: LIPASE, AMYLASE,  in the last 168 hours No results found for this basename: AMMONIA,  in the last 168 hours CBC:  Recent Labs Lab 01/15/14 2330  WBC 14.0*  NEUTROABS 9.7*  HGB  10.3*  HCT 30.3*  MCV 87.3  PLT 249   Cardiac Enzymes: No results found for this basename: CKTOTAL, CKMB, CKMBINDEX, TROPONINI,  in the last 168 hours  BNP (last 3 results) No results found for this basename: PROBNP,  in the last 8760 hours CBG: No results found for this basename: GLUCAP,  in the last 168 hours  Radiological Exams on Admission: Dg Chest 1 View  01/15/2014   CLINICAL DATA:  Altered mental, unresponsive  EXAM: CHEST - 1 VIEW  COMPARISON:  Prior chest x-ray 10/04/2013  FINDINGS: Cardiomegaly with left  heart enlargement. Vascular congestion without overt edema. Low lung volumes with bibasilar atelectasis. No pneumothorax. No definite effusion. No acute osseous abnormality.  IMPRESSION: 1. Cardiomegaly and pulmonary vascular congestion without overt edema. 2. Low inspiratory volumes with bibasilar subsegmental atelectasis.   Electronically Signed   By: Malachy Moan M.D.   On: 01/15/2014 23:52   Ct Head Wo Contrast  01/16/2014   CLINICAL DATA:  Altered mental status, fever  EXAM: CT HEAD WITHOUT CONTRAST  TECHNIQUE: Contiguous axial images were obtained from the base of the skull through the vertex without intravenous contrast.  COMPARISON:  None.  FINDINGS: Negative for acute intracranial hemorrhage, acute infarction, mass, mass effect, hydrocephalus or midline shift. Gray-white differentiation is preserved throughout. No focal soft tissue or calvarial abnormality. Unremarkable globes and orbits. Normal aeration of the mastoid air cells and visualized paranasal sinuses.  IMPRESSION: Negative head CT.   Electronically Signed   By: Malachy Moan M.D.   On: 01/16/2014 00:03    EKG: Independently reviewed.  Assessment/Plan Principal Problem:   UTI (lower urinary tract infection) Active Problems:   Altered mental status   1. UTI - will treat patients UTI with rocephin, no SIRS criteria other than his leukocytosis at this point. 2. AMS - while initially I was expecting to  find the patient to have delirium from his UTI, surprisingly he does not seem to have this.  Instead I think psych may be playing a role, he is certainly wake able, and when he wakes up can correctly answer all orientation questions before promptly going back to sleep.    Code Status: Full Code  Family Communication: No family in room Disposition Plan: Admit to obs   Time spent: 50 min  GARDNER, JARED M. [facility] Pager [phone]  If 7AM-7PM, please contact the day team taking care of the patient [email or web address] Password [email or web address] 01/16/2014, 3:35 AM

## 2014-01-16 NOTE — Progress Notes (Signed)
Called for report. RN will call back in a couple of minutes.

## 2014-01-16 NOTE — Clinical Social Work Psychosocial (Signed)
Clinical Social Work Department BRIEF PSYCHOSOCIAL ASSESSMENT 01/16/2014  Patient:  Adrian [name],Adrian [name]     Account Number:  [id number]     Admit date:  01/15/2014  Clinical Social Worker:  , BRYANT, LCSWA  Date/Time:  01/16/2014 01:00 PM  Referred by:  Physician  Date Referred:  01/16/2014 Referred for  SNF Placement   Other Referral:   Interview type:  Patient Other interview type:   Patient alert and oriented at time of assessment.    PSYCHOSOCIAL DATA Living Status:  ALONE Admitted from facility:  [facility] Level of care:  Skilled Nursing Facility Primary support name:  Media Primary support relationship to patient:  PARENT Degree of support available:   Support is good.    CURRENT CONCERNS Current Concerns  Post-Acute Placement   Other Concerns:    SOCIAL WORK ASSESSMENT / PLAN CSW met with patient at bedside to complete assessment. Patient confirms that he is from [facility] where he has been for about 2 weeks. Patient states that prior to [facility] he was at [facility]. Patient states that he plans to return to [facility] at discharge. Patient was calm and was engaged in assessment.   Assessment/plan status:  Psychosocial Support/Ongoing Assessment of Needs Other assessment/ plan:   Complete FL2, Fax   Information/referral to community resources:   CSW contact information given to patient.    PATIENT'S/FAMILY'S RESPONSE TO PLAN OF CARE: Patient states that he plans to return to [facility] at discharge. Patient was pleasant, appropriate, and appreciative of CSW contact. CSW will assist with DC back to [facility] when appropriate.     Bryant  MSW, LCSWA, LCASA, [phone] 

## 2014-01-17 DIAGNOSIS — R4182 Altered mental status, unspecified: Secondary | ICD-10-CM

## 2014-01-17 DIAGNOSIS — N390 Urinary tract infection, site not specified: Secondary | ICD-10-CM

## 2014-01-17 LAB — CBC
HCT: 32.1 % — ABNORMAL LOW (ref 39.0–52.0)
Hemoglobin: 10.7 g/dL — ABNORMAL LOW (ref 13.0–17.0)
MCH: 29.6 pg (ref 26.0–34.0)
MCHC: 33.3 g/dL (ref 30.0–36.0)
MCV: 88.9 fL (ref 78.0–100.0)
PLATELETS: 246 10*3/uL (ref 150–400)
RBC: 3.61 MIL/uL — AB (ref 4.22–5.81)
RDW: 15.9 % — ABNORMAL HIGH (ref 11.5–15.5)
WBC: 9.5 10*3/uL (ref 4.0–10.5)

## 2014-01-17 LAB — BASIC METABOLIC PANEL
BUN: 16 mg/dL (ref 6–23)
CO2: 23 mEq/L (ref 19–32)
Calcium: 8.6 mg/dL (ref 8.4–10.5)
Chloride: 99 mEq/L (ref 96–112)
Creatinine, Ser: 1.12 mg/dL (ref 0.50–1.35)
GFR calc Af Amer: 86 mL/min — ABNORMAL LOW (ref >90)
GFR, EST NON AFRICAN AMERICAN: 74 mL/min — AB (ref >90)
Glucose, Bld: 99 mg/dL (ref 70–99)
POTASSIUM: 3.9 meq/L (ref 3.7–5.3)
SODIUM: 140 meq/L (ref 137–147)

## 2014-01-17 LAB — MAGNESIUM: MAGNESIUM: 1.6 mg/dL (ref 1.5–2.5)

## 2014-01-17 LAB — HEMOGLOBIN A1C
Hgb A1c MFr Bld: 5.2 % (ref <5.7)
Mean Plasma Glucose: 103 mg/dL (ref <117)

## 2014-01-17 LAB — GLUCOSE, CAPILLARY
Glucose-Capillary: 115 mg/dL — ABNORMAL HIGH (ref 70–99)
Glucose-Capillary: 110 mg/dL — ABNORMAL HIGH (ref 70–99)

## 2014-01-17 MED ORDER — ZOLPIDEM TARTRATE 10 MG PO TABS: ORAL_TABLET | ORAL | Status: DC

## 2014-01-17 MED ORDER — CLONAZEPAM 0.5 MG PO TABS: ORAL_TABLET | ORAL | Status: DC

## 2014-01-17 MED ORDER — CEFUROXIME AXETIL 500 MG PO TABS: 500.0000 mg | ORAL_TABLET | Freq: Two times a day (BID) | ORAL | Status: DC

## 2014-01-17 NOTE — Progress Notes (Signed)
Patient was discharged to nursing home  by MD order; discharged instructions  review and sent to nursing home [facility] with care notes and prescriptions; IV DIC; skin - escoariation on abdominal folds; oxygen via NS 3L/min; patient will be transported to the facility via EMS.

## 2014-01-17 NOTE — Discharge Summary (Signed)
Physician Discharge Summary  Adrian [name] JYN:[id number] DOB: 02/09/1961 DOA: 01/15/2014  PCP: Dennison Mascot, MD  Admit date: 01/15/2014 Discharge date: 01/17/2014  Time spent: 40 minutes  Recommendations for Outpatient Follow-up:  1. Followup with the nursing facility in the.  Discharge Diagnoses:  Principal Problem:   UTI (lower urinary tract infection) Active Problems:   Altered mental status   Discharge Condition: Stable  Diet recommendation: Heart healthy diet  Filed Weights   01/15/14 2252 01/16/14 0500  Weight: 154.677 kg (341 lb) 153.769 kg (339 lb)    History of present illness:  Adrian [name] is a 53 y.o. male who is sent in from [facility] for new onset fever today as well as altered mental status. Fever of 102 noted per NH report, patient was noted to be "less responsive" per staff.  On arrival to the ED, patient is sleepy, but with enough stimulation, wakes up, is AAOx4 (although somewhat angry about being woken up), and states that he has no symptoms, before promptly going back to sleep (or just ignoring me).  Hospital Course:   1. UTI: Patient presented to the hospital with urinalysis consistent with UTI no SI or has criteria of admission other than leukocytosis. Patient started on Rocephin, for some reason the urine culture was not sent. Blood culture was no growth to date, patient is afebrile her leukocytosis resolved. So he was discharged back to the nursing home on Ceftin for 7 days.  2. Altered mental status: Patient reported to be "less responsive" per staff, while he was in the hospital he was alert and oriented x3 but had a short attention span and sleeps right away but wakes up with minimal verbal stimulation. This is resolved prior to discharge him I think this is might be secondary to the UTI.  3. Diabetes mellitus type 2: Home medications continued.  4. History of osteomyelitis and Charcot foot: Patient is a nursing home, following up with  physical therapy. Needs podiatrist and shoes fitting for his Charcot feet especially the left foot.  Procedures:  None  Consultations:  None  Discharge Exam: Filed Vitals:   01/17/14 0907  BP:   Pulse:   Temp:   Resp: 20   General: Alert and awake, oriented x3, not in any acute distress. HEENT: anicteric sclera, pupils reactive to light and accommodation, EOMI CVS: S1-S2 clear, no murmur rubs or gallops Chest: clear to auscultation bilaterally, no wheezing, rales or rhonchi Abdomen: soft nontender, nondistended, normal bowel sounds, no organomegaly Extremities: no cyanosis, clubbing or edema noted bilaterally Neuro: Cranial nerves II-XII intact, no focal neurological deficits   Discharge Instructions You were cared for by a hospitalist during your hospital stay. If you have any questions about your discharge medications or the care you received while you were in the hospital after you are discharged, you can call the unit and asked to speak with the hospitalist on call if the hospitalist that took care of you is not available. Once you are discharged, your primary care physician will handle any further medical issues. Please note that NO REFILLS for any discharge medications will be authorized once you are discharged, as it is imperative that you return to your primary care physician (or establish a relationship with a primary care physician if you do not have one) for your aftercare needs so that they can reassess your need for medications and monitor your lab values.  Discharge Orders   Future Orders Complete By Expires   Diet -  low sodium heart healthy  As directed    Increase activity slowly  As directed        Medication List         acetaminophen 325 MG tablet  Commonly known as:  TYLENOL  Take 650 mg by mouth every 6 (six) hours as needed for mild pain.     alum & mag hydroxide-simeth 200-200-20 MG/5ML suspension  Commonly known as:  MAALOX/MYLANTA  Take 30 mLs by  mouth every 4 (four) hours as needed for indigestion or heartburn.     cefUROXime 500 MG tablet  Commonly known as:  CEFTIN  Take 1 tablet (500 mg total) by mouth 2 (two) times daily with a meal.     clonazePAM 0.5 MG tablet  Commonly known as:  KLONOPIN  Take one tablet by mouth twice daily     enoxaparin 40 MG/0.4ML injection  Commonly known as:  LOVENOX  Inject 40 mg into the skin daily.     Fluticasone-Salmeterol 250-50 MCG/DOSE Aepb  Commonly known as:  ADVAIR  Inhale 1 puff into the lungs 2 (two) times daily.     gabapentin 300 MG capsule  Commonly known as:  NEURONTIN  Take 300 mg by mouth 4 (four) times daily.     guaifenesin 100 MG/5ML syrup  Commonly known as:  ROBITUSSIN  Take 200 mg by mouth 3 (three) times daily as needed for cough.     levothyroxine 75 MCG tablet  Commonly known as:  SYNTHROID, LEVOTHROID  Take 75 mcg by mouth daily before breakfast.     loratadine 10 MG tablet  Commonly known as:  CLARITIN  Take 10 mg by mouth daily.     magnesium hydroxide 400 MG/5ML suspension  Commonly known as:  MILK OF MAGNESIA  Take 30 mLs by mouth daily.     metFORMIN 1000 MG tablet  Commonly known as:  GLUCOPHAGE  Take 1,000 mg by mouth 2 (two) times daily with a meal.     metoprolol tartrate 25 MG tablet  Commonly known as:  LOPRESSOR  Take 25 mg by mouth 2 (two) times daily.     montelukast 10 MG tablet  Commonly known as:  SINGULAIR  Take 10 mg by mouth daily.     oxcarbazepine 600 MG tablet  Commonly known as:  TRILEPTAL  Take 600 mg by mouth 2 (two) times daily.     sennosides-docusate sodium 8.6-50 MG tablet  Commonly known as:  SENOKOT-S  Take 1 tablet by mouth 2 (two) times daily.     simethicone 125 MG chewable tablet  Commonly known as:  MYLICON  Chew 125 mg by mouth every 6 (six) hours as needed for flatulence.     tiotropium 18 MCG inhalation capsule  Commonly known as:  SPIRIVA  Place 18 mcg into inhaler and inhale daily.      torsemide 20 MG tablet  Commonly known as:  DEMADEX  Take 20 mg by mouth daily.     verapamil 120 MG CR tablet  Commonly known as:  CALAN-SR  Take 120 mg by mouth at bedtime.     ziprasidone 60 MG capsule  Commonly known as:  GEODON  Take 60 mg by mouth 2 (two) times daily with a meal.     zolpidem 10 MG tablet  Commonly known as:  AMBIEN  Take one tablet by mouth every night at bedtime as needed for rest       No Known Allergies    The  results of significant diagnostics from this hospitalization (including imaging, microbiology, ancillary and laboratory) are listed below for reference.    Significant Diagnostic Studies: Dg Chest 1 View  01/15/2014   CLINICAL DATA:  Altered mental, unresponsive  EXAM: CHEST - 1 VIEW  COMPARISON:  Prior chest x-ray 10/04/2013  FINDINGS: Cardiomegaly with left heart enlargement. Vascular congestion without overt edema. Low lung volumes with bibasilar atelectasis. No pneumothorax. No definite effusion. No acute osseous abnormality.  IMPRESSION: 1. Cardiomegaly and pulmonary vascular congestion without overt edema. 2. Low inspiratory volumes with bibasilar subsegmental atelectasis.   Electronically Signed   By: Malachy MoanHeath  McCullough M.D.   On: 01/15/2014 23:52   Ct Head Wo Contrast  01/16/2014   CLINICAL DATA:  Altered mental status, fever  EXAM: CT HEAD WITHOUT CONTRAST  TECHNIQUE: Contiguous axial images were obtained from the base of the skull through the vertex without intravenous contrast.  COMPARISON:  None.  FINDINGS: Negative for acute intracranial hemorrhage, acute infarction, mass, mass effect, hydrocephalus or midline shift. Gray-white differentiation is preserved throughout. No focal soft tissue or calvarial abnormality. Unremarkable globes and orbits. Normal aeration of the mastoid air cells and visualized paranasal sinuses.  IMPRESSION: Negative head CT.   Electronically Signed   By: Malachy MoanHeath  McCullough M.D.   On: 01/16/2014 00:03     Microbiology: Recent Results (from the past 240 hour(s))  CULTURE, BLOOD (ROUTINE X 2)     Status: None   Collection Time    01/15/14 11:25 PM      Result Value Ref Range Status   Specimen Description BLOOD ARM RIGHT   Final   Special Requests BOTTLES DRAWN AEROBIC AND ANAEROBIC 10CC   Final   Culture  Setup Time     Final   Value: 01/16/2014 03:34     Performed at [facility]   Culture     Final   Value:        BLOOD CULTURE RECEIVED NO GROWTH TO DATE CULTURE WILL BE HELD FOR 5 DAYS BEFORE ISSUING A FINAL NEGATIVE REPORT     Performed at [facility]   Report Status PENDING   Incomplete  CULTURE, BLOOD (ROUTINE X 2)     Status: None   Collection Time    01/15/14 11:25 PM      Result Value Ref Range Status   Specimen Description BLOOD HAND RIGHT   Final   Special Requests BOTTLES DRAWN AEROBIC ONLY 10CC   Final   Culture  Setup Time     Final   Value: 01/16/2014 03:34     Performed at [facility]   Culture     Final   Value:        BLOOD CULTURE RECEIVED NO GROWTH TO DATE CULTURE WILL BE HELD FOR 5 DAYS BEFORE ISSUING A FINAL NEGATIVE REPORT     Performed at [facility]   Report Status PENDING   Incomplete  MRSA PCR SCREENING     Status: Abnormal   Collection Time    01/16/14  6:18 AM      Result Value Ref Range Status   MRSA by PCR POSITIVE (*) NEGATIVE Final   Comment:            The GeneXpert MRSA Assay (FDA     approved for NASAL specimens     only), is one component of a     comprehensive MRSA colonization     surveillance program. It is not  intended to diagnose MRSA     infection nor to guide or     monitor treatment for     MRSA infections.     CRITICAL RESULT CALLED TO, READ BACK BY AND VERIFIED WITH:     [facility] G RN 01/16/14 0746 COSTELLO B     Labs: Basic Metabolic Panel:  Recent Labs Lab 01/15/14 2330 01/17/14 0508  NA 140 140  K 2.9* 3.9  CL 99 99  CO2 22 23  GLUCOSE 115* 99  BUN 14 16  CREATININE 1.26  1.12  CALCIUM 8.2* 8.6  MG  --  1.6   Liver Function Tests:  Recent Labs Lab 01/15/14 2330  AST 19  ALT 21  ALKPHOS 111  BILITOT 0.2*  PROT 6.1  ALBUMIN 2.9*   No results found for this basename: LIPASE, AMYLASE,  in the last 168 hours No results found for this basename: AMMONIA,  in the last 168 hours CBC:  Recent Labs Lab 01/15/14 2330 01/17/14 0508  WBC 14.0* 9.5  NEUTROABS 9.7*  --   HGB 10.3* 10.7*  HCT 30.3* 32.1*  MCV 87.3 88.9  PLT 249 246   Cardiac Enzymes: No results found for this basename: CKTOTAL, CKMB, CKMBINDEX, TROPONINI,  in the last 168 hours BNP: BNP (last 3 results) No results found for this basename: PROBNP,  in the last 8760 hours CBG:  Recent Labs Lab 01/16/14 1217 01/16/14 1648 01/16/14 2026 01/16/14 2258 01/17/14 0750  GLUCAP 105* 110* 107* 99 115*       Signed:  Broderick Fonseca A  [facility] 01/17/2014, 10:58 AM

## 2014-01-17 NOTE — Clinical Social Work Note (Signed)
Per MD [facility]. RN, patient, and facility notified of DC. RN given number for report. DC packet on chart. Ambulance transport requested for patient. CSW signing off at this time.   Roddie McBryant  Kray MSW, [place]CSWA, [place], [phone]

## 2014-01-22 LAB — CULTURE, BLOOD (ROUTINE X 2)
CULTURE: NO GROWTH
Culture: NO GROWTH

## 2014-01-24 ENCOUNTER — Non-Acute Institutional Stay (SKILLED_NURSING_FACILITY): Payer: Medicare Other | Admitting: Internal Medicine

## 2014-01-24 DIAGNOSIS — E1140 Type 2 diabetes mellitus with diabetic neuropathy, unspecified: Secondary | ICD-10-CM

## 2014-01-24 DIAGNOSIS — L219 Seborrheic dermatitis, unspecified: Secondary | ICD-10-CM

## 2014-01-24 DIAGNOSIS — E1142 Type 2 diabetes mellitus with diabetic polyneuropathy: Secondary | ICD-10-CM

## 2014-01-24 DIAGNOSIS — L218 Other seborrheic dermatitis: Secondary | ICD-10-CM

## 2014-01-24 DIAGNOSIS — N10 Acute tubulo-interstitial nephritis: Secondary | ICD-10-CM

## 2014-01-24 DIAGNOSIS — E1149 Type 2 diabetes mellitus with other diabetic neurological complication: Secondary | ICD-10-CM

## 2014-01-24 DIAGNOSIS — J45909 Unspecified asthma, uncomplicated: Secondary | ICD-10-CM

## 2014-01-27 ENCOUNTER — Other Ambulatory Visit: Payer: Self-pay | Admitting: *Deleted

## 2014-01-27 MED ORDER — CLONAZEPAM 0.5 MG PO TABS: ORAL_TABLET | ORAL | Status: DC

## 2014-01-27 NOTE — Telephone Encounter (Signed)
[facility] 

## 2014-01-29 NOTE — Progress Notes (Addendum)
Patient ID: Adrian [name], male   DOB: 10/14/1961, 53 y.o.   MRN: [id number]                   HISTORY & PHYSICAL  DATE:  01/24/2014    FACILITY: [facility]    LEVEL OF CARJacki Cones:   SNF   CHIEF COMPLAINT:  Readmission to long-term care, [facility], 01/15/2014 through 01/17/2014.    HISTORY OF PRESENT ILLNESS:  Mr. Adrian [name] is a gentleman whom I admitted to the building last month.  [facility] us [facility].  [facility] to this, at [facility].  The original hospitalization was secondary to a left foot ulceration and underlying osteomyelitis.    On the day of admission, he  apparently became acutely febrile.  In the emergency room, he was noted to have a large number of leukocytes and many bacteria on the urinalysis, although he did not seem to have a urine culture.  Blood cultures were negative.   He was given Rocephin and then Ceftin for seven days, and this seemed to resolve the issue.    He also was reported to be less responsive by the nursing home staff, or acutely unresponsive even according to one nurse with whom I spoke.  This did not seem to be observed in the hospital.    LABORATORY DATA:   As noted, blood cultures were negative.    Urine culture was not done.    A basic metabolic panel showed a sodium of 140, potassium of 3.9, BUN of 16, creatinine of 1.12.   White count was 9.5, his hemoglobin was 10.7, and platelet count 246.    Hemoglobin A1c was 5.2.    A CT scan of the head was negative.    PAST MEDICAL HISTORY/PROBLEM LIST:    Osteomyelitis of the left foot.    Type 2 diabetes.  On metformin.    Morbid obesity with obstructive sleep apnea.  He has a CPAP, although I am not sure he is using it.   History of asthma and/or COPD.  I am not exactly sure which.  The patient states asthma.    Hypertension.    Hyperlipidemia.    Supraventricular tachycardia.    Diabetic neuropathy.    Hypothyroidism.    History of  bipolar disorder.    CURRENT MEDICATIONS:  Medication list is reviewed.    Ceftin 1 tablet, 500 b.i.d., for seven days.     Klonopin 0.5 b.i.d.    Lovenox 40 mg daily.    Advair Diskus 250/50 b.i.d.    Neurontin 300 four times a day.    Synthroid 75 q.d.    Claritin 10 q.d.    Metformin 1000 b.i.d.    Metoprolol 25 b.i.d.    Singulair 10 q.d.    Trileptal 600 b.i.d.    Spiriva 18 daily.    Demadex 20 q.d.    Verapamil CR 120 daily.    Geodon 60 b.i.d.    Ambien 10 mg q.h.s.    SOCIAL HISTORY:   HOUSING:  The patient tells me that prior to all of this, he was in [place] in his own home.   FUNCTIONAL STATUS:   He was using a walker.  There is a wheelchair.  His exact functional status is unclear.    REVIEW OF SYSTEMS:   CHEST/RESPIRATORY:  He has wheezing somewhat and states he has a cold.   CARDIAC:   No chest pain.  GI:  No nausea, vomiting, abdominal pain, or diarrhea.   GU:  No current dysuria.    PHYSICAL EXAMINATION:   VITAL SIGNS:   RESPIRATIONS:  18.   GENERAL APPEARANCE:  The patient looks much the same as I remember him last time.   CHEST/RESPIRATORY:  There is diffuse wheezing.  However, his work of breathing is normal.   CARDIOVASCULAR:  CARDIAC:   Heart sounds are normal.  There are no murmurs.   GASTROINTESTINAL:  ABDOMEN:   Soft.   LIVER/SPLEEN/KIDNEYS:  No liver, no spleen.  No tenderness.   GENITOURINARY:  BLADDER:   No suprapubic tenderness.   CIRCULATION:   ARTERIAL:  Extremities:  Peripheral pulses are palpable.   NAILS:  He has unkempt nails.   NEUROLOGICAL:   He has diabetic neuropathy and Charcot deformities.      ASSESSMENT/PLAN:  Status post admission with a UTI.  He has been successfully treated.  His postvoid residual issue might need to be clarified if he turns out to be somebody with recurrent UTIs.     History of asthma.  I am going to leave him a p.r.n. rescue inhaler.    Type 2 diabetes with diabetic neuropathy.   Hemoglobin A1c at 5.2 indicates fairly wonderful control on metformin.     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  He is certainly going to need something here.    CPT CODE: [id number]

## 2014-02-24 ENCOUNTER — Non-Acute Institutional Stay (SKILLED_NURSING_FACILITY): Payer: Medicare Other | Admitting: Internal Medicine

## 2014-02-24 DIAGNOSIS — I10 Essential (primary) hypertension: Secondary | ICD-10-CM | POA: Insufficient documentation

## 2014-02-24 DIAGNOSIS — E039 Hypothyroidism, unspecified: Secondary | ICD-10-CM

## 2014-02-24 DIAGNOSIS — J309 Allergic rhinitis, unspecified: Secondary | ICD-10-CM | POA: Insufficient documentation

## 2014-02-24 DIAGNOSIS — K5900 Constipation, unspecified: Secondary | ICD-10-CM

## 2014-02-24 NOTE — Progress Notes (Signed)
         PROGRESS NOTE  DATE: 02/24/2014  FACILITY: Nursing Home Location: [facility]  LEVEL OF CARE: SNF (31)  Routine Visit  CHIEF COMPLAINT:  Manage allergic rhinitis, constipation and hypothyroidism  HISTORY OF PRESENT ILLNESS:  REASSESSMENT OF ONGOING PROBLEM(S):  ALLERGIC RHINITIS: Allergic rhinitis remains stable.  Patient denies ongoing symptoms such as runny nose sneezing or tearing. No complications reported from the current medication(s) being used.  CONSTIPATION: The constipation remains stable. No complications from the medications presently being used. Patient denies ongoing constipation, abdominal pain, nausea or vomiting.  HYPOTHYROIDISM: The hypothyroidism remains stable. No complications noted from the medications presently being used.  The patient denies fatigue or constipation.  Last TSH 1.81 in 3-15  PAST MEDICAL HISTORY : Reviewed.  No changes/see problem list  CURRENT MEDICATIONS: Reviewed per MAR/see medication list  REVIEW OF SYSTEMS:  GENERAL: no change in appetite, no fatigue, no weight changes, no fever, chills or weakness RESPIRATORY: no cough, SOB, DOE, wheezing, hemoptysis CARDIAC: no chest pain, or palpitations, complains of chronic lower extremity swelling GI: no abdominal pain, diarrhea, constipation, heart burn, nausea or vomiting  PHYSICAL EXAMINATION  VS:  See VS section  GENERAL: no acute distress, morbidly obese body habitus EYES: conjunctivae normal, sclerae normal, normal eye lids NECK: supple, trachea midline, no neck masses, no thyroid tenderness, no thyromegaly LYMPHATICS: no LAN in the neck, no supraclavicular LAN RESPIRATORY: breathing is even & unlabored, BS CTAB CARDIAC: RRR, no murmur,no extra heart sounds, +4 left lower extremity edema and +3 right lower extremity edema GI: abdomen soft, normal BS, no masses, no tenderness, no hepatomegaly, no splenomegaly PSYCHIATRIC: the patient is alert &  oriented to person, affect & behavior appropriate  LABS/RADIOLOGY: 3-15 hemoglobin 10.9, MCV 86 otherwise CBC normal, total protein 5.7, alkaline phosphatase 189 otherwise CMP normal, hemoglobin A1c 5.5  ASSESSMENT/PLAN:  Hypothyroidism-well-controlled Allergic rhinitis- well controlled Constipation-continue laxatives Hypertension-blood pressure elevated. Will review a log. Diabetes mellitus-well controlled Peripheral neuropathy-denies ongoing symptoms Bipolar disorder-stable  CPT CODE: [id number]  Angela CoxGayani Y Dasanayaka, MD [facility] [phone]

## 2014-04-25 ENCOUNTER — Observation Stay: Payer: Self-pay | Admitting: Internal Medicine

## 2014-04-25 LAB — URINALYSIS, COMPLETE
BILIRUBIN, UR: NEGATIVE
Blood: NEGATIVE
GLUCOSE, UR: NEGATIVE mg/dL (ref 0–75)
KETONE: NEGATIVE
Nitrite: NEGATIVE
PROTEIN: NEGATIVE
Ph: 7 (ref 4.5–8.0)
RBC,UR: 1 /HPF (ref 0–5)
Specific Gravity: 1.004 (ref 1.003–1.030)
Squamous Epithelial: NONE SEEN
WBC UR: 20 /HPF (ref 0–5)

## 2014-04-25 LAB — CBC WITH DIFFERENTIAL/PLATELET
Basophil #: 0.1 10*3/uL (ref 0.0–0.1)
Basophil %: 1.3 %
EOS PCT: 4.8 %
Eosinophil #: 0.5 10*3/uL (ref 0.0–0.7)
HCT: 30.5 % — ABNORMAL LOW (ref 40.0–52.0)
HGB: 10.2 g/dL — AB (ref 13.0–18.0)
LYMPHS PCT: 17.6 %
Lymphocyte #: 1.8 10*3/uL (ref 1.0–3.6)
MCH: 29.1 pg (ref 26.0–34.0)
MCHC: 33.3 g/dL (ref 32.0–36.0)
MCV: 87 fL (ref 80–100)
MONO ABS: 0.8 x10 3/mm (ref 0.2–1.0)
MONOS PCT: 7.5 %
Neutrophil #: 7.1 10*3/uL — ABNORMAL HIGH (ref 1.4–6.5)
Neutrophil %: 68.8 %
Platelet: 352 10*3/uL (ref 150–440)
RBC: 3.5 10*6/uL — ABNORMAL LOW (ref 4.40–5.90)
RDW: 14.7 % — ABNORMAL HIGH (ref 11.5–14.5)
WBC: 10.3 10*3/uL (ref 3.8–10.6)

## 2014-04-25 LAB — COMPREHENSIVE METABOLIC PANEL
ALT: 15 U/L (ref 12–78)
ANION GAP: 10 (ref 7–16)
Albumin: 3 g/dL — ABNORMAL LOW (ref 3.4–5.0)
Alkaline Phosphatase: 127 U/L — ABNORMAL HIGH
BUN: 13 mg/dL (ref 7–18)
Bilirubin,Total: 0.2 mg/dL (ref 0.2–1.0)
Calcium, Total: 8.8 mg/dL (ref 8.5–10.1)
Chloride: 102 mmol/L (ref 98–107)
Co2: 29 mmol/L (ref 21–32)
Creatinine: 1.01 mg/dL (ref 0.60–1.30)
EGFR (Non-African Amer.): >60
Glucose: 97 mg/dL (ref 65–99)
OSMOLALITY: 281 (ref 275–301)
Potassium: 3.7 mmol/L (ref 3.5–5.1)
SGOT(AST): 12 U/L — ABNORMAL LOW (ref 15–37)
Sodium: 141 mmol/L (ref 136–145)
TOTAL PROTEIN: 6.9 g/dL (ref 6.4–8.2)

## 2014-04-26 LAB — BASIC METABOLIC PANEL
ANION GAP: 10 (ref 7–16)
BUN: 11 mg/dL (ref 7–18)
CALCIUM: 8.5 mg/dL (ref 8.5–10.1)
CHLORIDE: 106 mmol/L (ref 98–107)
CREATININE: 1.08 mg/dL (ref 0.60–1.30)
Co2: 27 mmol/L (ref 21–32)
Glucose: 100 mg/dL — ABNORMAL HIGH (ref 65–99)
OSMOLALITY: 284 (ref 275–301)
Potassium: 4.1 mmol/L (ref 3.5–5.1)
SODIUM: 143 mmol/L (ref 136–145)

## 2014-04-26 LAB — CBC WITH DIFFERENTIAL/PLATELET
BASOS PCT: 0.9 %
Basophil #: 0.1 10*3/uL (ref 0.0–0.1)
EOS PCT: 4.8 %
Eosinophil #: 0.5 10*3/uL (ref 0.0–0.7)
HCT: 29 % — AB (ref 40.0–52.0)
HGB: 9.6 g/dL — ABNORMAL LOW (ref 13.0–18.0)
LYMPHS PCT: 14.8 %
Lymphocyte #: 1.5 10*3/uL (ref 1.0–3.6)
MCH: 29.1 pg (ref 26.0–34.0)
MCHC: 33.3 g/dL (ref 32.0–36.0)
MCV: 87 fL (ref 80–100)
Monocyte #: 0.8 x10 3/mm (ref 0.2–1.0)
Monocyte %: 8.2 %
NEUTROS PCT: 71.3 %
Neutrophil #: 7.3 10*3/uL — ABNORMAL HIGH (ref 1.4–6.5)
Platelet: 331 10*3/uL (ref 150–440)
RBC: 3.32 10*6/uL — ABNORMAL LOW (ref 4.40–5.90)
RDW: 14.7 % — ABNORMAL HIGH (ref 11.5–14.5)
WBC: 10.2 10*3/uL (ref 3.8–10.6)

## 2014-04-27 LAB — URINE CULTURE

## 2014-04-29 LAB — WOUND CULTURE

## 2014-04-30 LAB — CULTURE, BLOOD (SINGLE)

## 2014-08-11 ENCOUNTER — Ambulatory Visit: Payer: Self-pay

## 2014-09-23 DIAGNOSIS — N179 Acute kidney failure, unspecified: Secondary | ICD-10-CM | POA: Insufficient documentation

## 2014-10-02 ENCOUNTER — Ambulatory Visit: Payer: Self-pay | Admitting: Internal Medicine

## 2014-10-02 LAB — VANCOMYCIN, TROUGH: Vancomycin, Trough: 10 ug/mL (ref 10–20)

## 2015-02-06 NOTE — Op Note (Signed)
PATIENT NAME:  Adrian [name], Adrian [name] MR#:  [id number] DATE OF BIRTH:  [birth year]  DATE OF PROCEDURE:  10/23/2012  PREOPERATIVE DIAGNOSIS:  Facial abscess.  POSTOPERATIVE DIAGNOSIS:  Facial abscess.  PROCEDURE PERFORMED: Incision and drainage, facial abscess.  ANESTHESIA:   MAC.  SURGEON:   Quentin Orealph L. Ely III, MD.  PROCEDURE IN DETAIL: With the patient in the supine position after induction of appropriate intravenous sedation, the patient's neck and face were prepped with Betadine and draped with sterile towels. The area was infiltrated with 1% Xylocaine buffered with sodium bicarbonate. The area was incised and a large amount of foul-smelling purulent material was removed. The abscess appeared to be quite deep. It did not appear to involve the floor of the mouth or touch the bone. A counter incision was made and a Penrose drain placed. The drain was secured with 3-0 nylon. Sterile dressings were applied. The patient was returned to the recovery room having tolerated the procedure well. Sponge, instrument and needle counts were correct x 2 in the operating room.   ____________________________ Quentin Orealph L. Ely III, MD rle:eg D: 10/23/2012 14:18:41 ET T: 10/23/2012 20:39:54 ET JOB#: [id number]  cc: Quentin Orealph L. Ely III, MD, <Dictator> Quentin OreALPH L ELY MD ELECTRONICALLY SIGNED 10/25/2012 18:50

## 2015-02-06 NOTE — Consult Note (Signed)
Brief Consult Note: Diagnosis: 1. Chin cellulitis/abscess 2. Morbid Obesity 3. OSA 4. HTN 5. DM 6. Bipolar Disorder 7. Neuropathy 8. Hypothyroidism 9. GERD.   Patient was seen by consultant.   Consult note dictated.   Recommend to proceed with surgery or procedure.   Orders entered.   Comments: 54 yo male w/ hx of HTN, DM, Hypothyroidism, GERD, Morbid Obesity, OSA, Bipolar disorder came into hospital due to bleeding/pus drainange from his Chin and noted to have Chin abscess/cellulitis.   1. Chin Cellulitis/abscess - cont. IV ancef.  - no contraindications to surgery and likely moderate risk for non-cardiac surgery.  - going to OR today for I & D.   2. DM - cont. Metformin, SSI and follow sugars.  - check A1c  3. HTN - cont. HCTZ/Losartan, Verapamil  4. GERD - cont. omeprazole.   5. Neuropathy - cont. Neurontin 6. OSA - cont. Cpap at bedtime 7. Hypothyroidism - cont. synthroid 8. Bipolar disorder - cont. Geodon, Trileptal.   Thanks for the consult and will follow with you.  JOb # [id number].  Electronic Signatures: Houston SirenSainani, Nerissa Constantin J (MD)  (Signed 07-Jan-14 11:47)  Authored: Brief Consult Note   Last Updated: 07-Jan-14 11:47 by Houston SirenSainani, Kerrie Timm J (MD)

## 2015-02-06 NOTE — H&P (Signed)
PATIENT NAME:  Adrian [name], Adrian [name] MR#:  [id number] DATE OF BIRTH:  07/26/1961  DATE OF ADMISSION:  10/22/2012  [facility], Dr. Charlette CaffeyMorrissey   ADMITTING PHYSICIAN: Dr. Michela PitcherEly  CHIEF COMPLAINT: Abscess, chin.   BRIEF HISTORY: Mr. Adrian [name] is a 54 year old gentleman seen in the Emergency Room with a several-week history of cellulitis and development of an abscess on his chin. He began to drain spontaneously this morning. He has had problems opening his mouth over the last several weeks which has increased in erythema and tenderness until he began to drain spontaneously today. He presented to the Emergency Room where ER evaluation felt that the abscess was too significant to be drained without anesthetic assistance. Surgery was consulted.   PAST MEDICAL HISTORY: The patient has multiple medical problems including severe morbid obesity, diabetes, multiple electrolyte abnormalities, hypertension, cellulitis, psoriasis, lower extremity ulcers, venostasis disease, chronic lower extremity edema, hypercholesterolemia, bipolar disorder. Currently, he is a non-insulin-dependent diabetic.   MEDICATIONS: As of his last admission include:  Neurontin 300 mg p.o. t.i.d., lorazepam 1 mg p.o. q. day, Singulair 10 mg p.o. q. day, aspirin 81 mg p.o. q. day, Claritin 10 mg p.o. q. day, Abilify 50 mg p.o. q. day, Ambien 12.5 mg p.o. q. day, omeprazole 20 mg p.o. q. day, Trileptal 600 mg twice a day,  Spiriva 18 mcg a day, Synthroid 50 mcg a day, metformin 1000 mg [name].i.d., Micardis 80/25, hydrochlorothiazide once a day, loratidine  10 mg once a day, and Advair 250/50 [name].i.d.   ALLERGIES: HE IS ALLERGIC TO LIPITOR, BY HISTORY.   PAST SURGICAL HISTORY: His only previous surgery was a tonsillectomy and adenoidectomy.   SOCIAL HISTORY: He lives with his mother and currently does not use alcohol or tobacco. He is disabled from his previously noted bipolar disorder.  REVIEW OF SYSTEMS: Otherwise  unremarkable other than symptoms noted above. He does not have any known shortness of breath but does have history of sleep apnea.   PHYSICAL EXAMINATION: GENERAL: He is a massively obese gentleman lying in bed, unable really to move by himself.  VITALS: Blood pressure is 148/74. Oxygen saturation is 94% on room air. Heart rate is 82 and regular.  HEENT: No scleral icterus. No facial deformities. He has a large soft tissue abscess on his right chin extending below his mandible. There is some moderate cellulitis. There does appear to be an open draining area.  NECK: Supple but moderately tender. No organomegaly. No adenopathy. No palpable thyroid noted.  CHEST: Clear, but he has very distant breath sounds, appears to have normal pulmonary excursion without pain.  CARDIAC: No murmurs or gallops. He seems to be in normal sinus rhythm.  ABDOMEN: His abdomen is massively obese with cellulitic changes in his pannus. There does not appear to be an abscess. He does not have any other abdominal findings.  EXTREMITIES: Marked cellulitis, psoriasis and venostasis disease. His feet are wrapped, but he does allegedly have an ulcer on his right foot which we will evaluate when he is hospitalized.  PSYCHIATRIC: Otherwise unremarkable, though he does appear a little inappropriate during questioning.   IMPRESSION/PLAN: This gentleman appears to have a large facial abscess which will likely benefit from surgical drainage. With his morbid obesity and multiple medical problems, I am  admitting him to the hospital, placing him on antibiotics. I will get a medical risk assessment to assist with his  medications and management and plan to take him to surgery in the next 24  hours. This plan has been discussed with the patient in detail, and he is in agreement.  ____________________________ Carmie End, MD rle:cb D: 10/22/2012 15:48:28 ET T: 10/22/2012 17:31:59 ET JOB#: [id number]  cc: Quentin Ore III, MD,  <Dictator> Dennison Mascot, MD Quentin Ore MD ELECTRONICALLY SIGNED 10/25/2012 18:49

## 2015-02-06 NOTE — H&P (Signed)
PATIENT NAME:  Adrian [name], Adrian [name] MR#:  [id number] DATE OF BIRTH:  10-05-1961  DATE OF ADMISSION:  03/14/2013  PRIMARY CARE PHYSICIAN: Dennison MascotLemont Morrisey, MD  REFERRING PHYSICIAN: Bayard Malesandolph Brown, MD   CHIEF COMPLAINT: Not feeling well.   HISTORY OF PRESENT ILLNESS: Adrian [name] is a 54 year old super morbidly obese white male with BMI of 61. Has history of bipolar disorder, diabetes mellitus, hypertension. Had a recent admission in January 2014 with panniculitis.  The patient was treated with antibiotics and was discharged to the skilled nursing facility. The patient has not been feeling well for the last 2 days. Concerning this came to the Emergency Department. The patient was found to have extensive redness and swelling under the left lower aspect of the pannus with worsening of the redness spreading more to upper part of the abdomen.  Did not check his temperature. Denies having any cough and shortness of breath. The patient was found to have elevated white blood cell count of 22,000. The patient received 1 dose of vancomycin and Zosyn in the Emergency Department, after obtaining blood cultures. No other obvious signs of infections were noted.  At baseline the patient walks very limited. Has a bedside commode in his room which he uses. The patient's 54 year old mother takes care of this patient predominantly.   PAST MEDICAL HISTORY: 1.  Hypertension.  2.  Hyperlipidemia.  3.  Diabetes mellitus.  4.  Morbid obesity.  5.  Obstructive sleep apnea.  6.  Hypothyroidism.  7.  Bipolar disorder.  8.  Diabetic nephropathy.  9.  Tonsillectomy.  ALLERGIES: No known drug allergies.   HOME MEDICATIONS: 1.  Ziprasidone 80 mg 2 times a day. 2.  Zetia 10 mg once a day.  3.  Verapamil 200 mg 2 tablets once a day.  4.  Tylenol 2 tablets once a day.  5.  Torsemide 20 mg once a day.  6.  Spiriva 18 mcg once a day.  7.  Singulair 10 mg once a day.  8.  Phendimetrazine 105 mg 1 capsule once a day.  9.   Oxcarbazepine 300 mg 2 times a day.  10.  Omeprazole 20 mg once a day.  11.  Nystatin 100,000 units to the affected area.  12.  Metformin 1000 mg 2 times a day.  13.  Metolazone 5 mg 2 times a day.  14.  Melatonin 3 mg 2 times a day.  15.  Loratadine 10 mg once a day.  16.  Levothyroxine 50 mcg once a day.  17.  Hydrochlorothiazide/losartan 25/100 mg tablet daily.  18.  Gabapentin 300 mg 1 tablet 4 times a day.  19.  Dulcolax suppository as needed.  20.  Diflucan 200 mg once a day.  21.  Centrum 1 tablet once a day.  22.  Bactrim DS 2 tablets every 12 hours.  23.  Aspirin 325 mg once a day.  24.  Ambien CR 12.5 mg once a day.  25.  Albuterol 2 puffs inhalation every 4 hours.  26.  Advair Diskus 1 puff 2 times a day.  27.  Acetaminophen 1 tablet 2 times a day.   SOCIAL HISTORY: No history of smoking, drinking, alcohol or using illicit drugs.  Lives at home with his mother.   FAMILY HISTORY: Significant for congestive heart failure on mother's side.  Father with history of CVA.   REVIEW OF SYSTEMS:  CONSTITUTIONAL:  Has fever, generalized weakness and fatigue.  EYES: No change in vision. No discharge.  EARS: No  change in hearing. No tinnitus.  No sore throat.  RESPIRATORY:  No cough, shortness of breath.  CARDIOVASCULAR: No chest pain, palpitations.  GASTROINTESTINAL: No nausea, vomiting, abdominal pain.  GENITOURINARY: No dysuria or hematochezia.  ENDOCRINE: No polyuria or polydipsia. The patient has history of hypothyroidism and diabetes mellitus.  HEME:  No easy bruising or bleeding.  SKIN: Extensive rash under the pannus as well as erythematous pressure ulcer under the left popliteal area.  MUSCULOSKELETAL: The patient has severe limited range of motion from bedbound status.  PSYCH:  The patient has history of depression and bipolar disorder.   PHYSICAL EXAMINATION: GENERAL: This is a morbidly obese male lying down in the bed, not in distress.  VITAL SIGNS: Temperature 98.5,  pulse 114, blood pressure 143/72, respiratory rate of 16, oxygen saturation is 97% on room air.  HEENT: Head normocephalic, atraumatic. No scleral icterus. Conjunctivae normal. Pupils equal and react to light. Mucous membranes moist.  NECK: Supple. No lymphadenopathy. No JVD. No carotid bruit. No thyromegaly.  CHEST: Has no focal tenderness. Lungs bilateral clear to auscultation.  HEART: S1 and S2 regular. No murmurs are heard.  ABDOMEN: Obese. Bowel sounds present.  Has a large pannus with extensive redness and discoloration of the skin under the pannus, the redness extending to the upper part of the abdomen.  EXTREMITIES: Contracted from not moving.  SKIN: Has extensive rash secondary to yeast infection as well as in addition to that. NEUROLOGIC:  The patient is alert, oriented to place, person and time. Cranial nerves: No apparent cranial nerve abnormalities.  I could not examine the motor secondary to severe restriction from severe debility.   LABS: CBC: WBC of 22.1, hemoglobin 9.9, platelet count of 596. CMP is completely within normal limits.   ASSESSMENT AND PLAN: Adrian [name] is a 54 year old morbidly obese male who comes to the Emergency Department for recurrent admissions for panniculitis. 1.  Panniculitis. The patient has been using nystatin powder, however, there is concern about the patient's hygiene. The patient would benefit the most from aeration of the area, however, it could be an extremely difficult task. Keep the area clean.  Continue the nystatin powder. Start the patient on vancomycin and Zosyn. Follow up with the blood cultures.  2.  Diabetes mellitus. The patient will hold his metformin for now.  3.  Continue sliding scale insulin.  4.  Hypertension. Currently well controlled.  5.  Bipolar disorder. Continue the home medications.  6.  Keep the patient on deep vein thrombosis prophylaxis with Lovenox. 7. Morbid obesity- pt hs poor functional status. Councelled with the  patient. Poor insight.   TIME SPENT: 55 minutes. ____________________________ Susa Griffins, MD pv:sb D: 03/14/2013 05:09:03 ET T: 03/14/2013 07:26:15 ET JOB#: [id number]  cc: Susa Griffins, MD, <Dictator> Dennison Mascot, MD Susa Griffins MD ELECTRONICALLY SIGNED 03/15/2013 7:34

## 2015-02-06 NOTE — Discharge Summary (Signed)
PATIENT NAME:  Adrian [name], Adrian [name] MR#:  [id number] DATE OF BIRTH:  [birth year]  DATE OF ADMISSION:  06/12/2013 DATE OF DISCHARGE:  06/17/2013  PRIMARY CARE PHYSICIAN:  Dennison MascotLemont Morrisey, MD  DISCHARGE DIAGNOSES:  Left leg cellulitis, leukocytosis, acute renal failure, cholelithiasis, diabetes, morbid obesity, sleep apnea.   CONDITION: Stable.   CODE STATUS: Full code.   MEDICATIONS: [facility] medication reconciliation [facility]. The patient we will continue his home medication. New medication is  Augmentin 875 mg/125 mg p.o. q.12 hours for 7 days.   The patient needs home health with home physical therapy.   DIET: Low sodium, low fat, low cholesterol, ADA diet.   ACTIVITY: As tolerated.   FOLLOWUP CARE: Follow up with PCP within 1 week. The patient needs followup CBC with PCP.   REASON FOR ADMISSION: Left leg swelling, erythema.   HOSPITAL COURSE: The patient is a 54 year old morbidly obese Caucasian male with a history of hypertension, diabetes, who presented to the ED with left leg swelling, erythema and tenderness. The patient has had chronic left leg swelling.  In the ED, he was noted to have a left leg cellulitis and increased creatinine so the patient was admitted for cellulitis. For detailed history and physical examination, please refer to the admission note dictated by Dr. Auburn BilberryShreyang Patel. On admission date, the patient's laboratory data is as follows: BUN 31, creatinine 1.33, sodium 122, potassium 2.7, chloride 82, AST 281, ALT 355. Left leg duplex did not show any DVT.  1.  Left leg cellulitis.  After admission, the patient has been treated with vancomycin and Zosyn. Blood culture so far is negative. In addition, the patient got wound care. The patient's left leg cellulitis has much improved. No erythema, tenderness. Swelling is much better and white count decreased from 18.6 to 14.4.  2.  For acute renal failure and hyponatremia, the patient's  HCTZ/losartan was on hold due to renal failure. The patient has been treated with IV fluid support. BUN decreased to 11,  creatinine decreased to 0.9, sodium increased to 137.  3.  Hypokalemia. The patient was treated with potassium supplement and potassium increased from 2.7 to 3.8.  4.  Diabetes. The patient has been treated with sliding scale. Sugar has been stable.  5.  Hypertension. The patient has been treated with hypertension medication.  6.  Elevated liver function tests. Since the patient has elevated liver function tests, the patient got a hepatitis panel test which is normal. In addition, the patient had ultrasound of the abdomen which showed cholelithiasis but no evidence of cholecystitis. In addition, the patient underwent physical therapy.   The patient has no complaints. His vital signs are stable. He will be discharged to home with home health and PT today. I discussed the patient's discharge plan with the patient, case manager and nurse.   TIME SPENT: About 36 minutes.   ____________________________ Shaune PollackQing Madylin Fairbank, MD qc:cs D: 06/17/2013 15:16:00 ET T: 06/17/2013 15:43:59 ET JOB#: [id number]  cc: Shaune PollackQing Kayleena Eke, MD, <Dictator> Shaune PollackQING Martine Trageser MD ELECTRONICALLY SIGNED 06/17/2013 16:09

## 2015-02-06 NOTE — Consult Note (Signed)
PATIENT NAME:  Adrian [name], Adrian [name] MR#:  [id number] DATE OF BIRTH:  16-Mar-1961  DATE OF CONSULTATION:  10/23/2012  REFERRING PHYSICIAN:  Dr. Marshia Ly CONSULTING PHYSICIAN:  Dr. Rolly Pancake. Ophie Burrowes PRIMARY CARE PHYSICIAN: Dr. Dennison Mascot   REASON FOR CONSULTATION: Preoperative evaluation and medical management.   HISTORY OF PRESENT ILLNESS: This is a 54 year old male who presents to the Emergency Room due to some bleeding and pus drainage from his right side of his chin. The patient was noted to have a right-sided chin abscess and cellulitis and was admitted to the hospital with IV antibiotics and possible incision and drainage. The patient is to go to the OR today for incision and drainage of the right chin abscess. Hospitalist services were contacted for preoperative medical evaluation and medical management. The patient presently denies any chest pain, any shortness of breath, any nausea, vomiting, abdominal pain, fevers, chills, cough or any other associated symptoms. The patient has never had a previous MI or CVA in the past.   REVIEW OF SYSTEMS:  CONSTITUTIONAL: No documented fever. No weight gain, no weight loss.  EYES: No blurry or double vision.  ENT: No tinnitus. No postnasal drip. No redness of the oropharynx.  RESPIRATORY: No cough, no wheeze, no hemoptysis.  CARDIOVASCULAR: No chest pain, no orthopnea, no palpitations, no syncope.  GASTROINTESTINAL: No nausea, no vomiting, no diarrhea, no abdominal pain, no melena, no hematochezia.  GENITOURINARY: No dysuria, no hematuria.  ENDOCRINE: No polyuria or nocturia, heat or cold intolerance  HEMATOLOGIC: No anemia, no bruising, or bleeding.  INTEGUMENTARY: No rashes. No lesions.  MUSCULOSKELETAL: No arthritis, no swelling, and no gout.  NEUROLOGIC: No numbness, no tingling, no ataxia, no seizure-type activity.  PSYCHIATRIC: No anxiety, no insomnia, no ADD. Positive bipolar disorder.   PAST MEDICAL HISTORY: Morbid obesity, obstructive  sleep apnea, hypertension, hypothyroidism, bipolar disorder, diabetes, diabetic neuropathy.   ALLERGIES: No known drug allergies.  SOCIAL HISTORY: No smoking. No alcohol abuse. No illicit drug abuse. He lives at home with his mom.   FAMILY HISTORY: The patient's mother has congestive heart failure and is alive and healthy. Father died from a stroke.   CURRENT MEDICATIONS: Advair 250/50, 1 puff b.i.d., Ambien 12.5 mg at bedtime, Astepro 1 spray nasally b.i.d., torsemide 20 mg b.i.d., Geodon 80 mg b.i.d., metformin 1000 mg b.i.d., potassium 20 mEq t.i.d., Singulair 10 mg daily, omeprazole 20 mg daily, Trileptal 300 mg b.i.d., Synthroid 50 mcg daily, aspirin 325 mg daily, Tylenol Extra Strength 1000 mg at bedtime, hydrochlorothiazide/losartan 25/100, 2 tabs daily, melatonin 3 mg tablet 2 tabs at bedtime, loratadine 10 mg daily, verapamil 200 mg 2 caps daily, Centrum Multivitamin daily, Dulcolax suppository daily as needed, Neurontin 300 mg q.i.d., phendimetrazine 105 mg oral capsule 1 cap daily 30 minutes before breakfast.   PHYSICAL EXAMINATION: VITAL SIGNS: Temperature 99.2, pulse 100, respirations 18, blood pressure 150/75, sats 93% on room air.  GENERAL: The patient is a morbidly obese-appearing male but in no apparent distress.  HEENT: He is atraumatic, normocephalic. Extraocular muscles are intact. Pupils are equal and reactive to light. Sclerae are anicteric. No conjunctival injection. No pharyngeal erythema.  NECK: Supple. There is no jugular venous distention, no bruits, no lymphadenopathy or thyromegaly.  HEART: Regular rate and rhythm. No murmurs, no rubs, or clicks.  LUNGS: Clear to auscultation bilaterally. No rales, no rhonchi, no wheezes.  ABDOMEN: Soft, nontender, nondistended. Difficult to do abdominal exam due to his morbid obesity. Good bowel sounds. No hepatosplenomegaly appreciated.  EXTREMITIES: No  evidence of any cyanosis or clubbing. He does have significant peripheral edema  about +2 to 3 bilaterally. His legs or wrapped in a bandage.  SKIN: Moist and warm with no rashes appreciated. He has +2 pedal and radial pulses bilaterally. LYMPHATIC: There is no cervical or axillary lymphadenopathy.  NEUROLOGICAL: He is alert, awake, and oriented x3 with no focal motor or sensory deficits appreciated bilaterally.   LABORATORY, DIAGNOSTIC AND RADIOLOGICAL DATA:  Serum glucose 100, BUN 11, creatinine 0.8, sodium 142, potassium 3.5, chloride 104, bicarbonate 29. LFTs were within normal limits. White cell count 12.2, hemoglobin 9.1, hematocrit 26.7, platelet count 472.   ASSESSMENT AND PLAN: This is a 54 year old male with a history of hypertension, diabetes, hypothyroidism, GERD, morbid obesity, obstructive sleep apnea, bipolar disorder, who presents to the hospital due to bleeding pus drainage from the right side of his chin, noted to have chin abscess and cellulitis.   1. Right-sided chin cellulitis and abscess: There is no contraindication to surgery at this time. The patient is likely moderate risk for noncardiac surgery. He is to go for incision and drainage later today. Continue with IV Ancef. Continue pain control. Care as per Surgery.  2. Diabetes: Continue with metformin, continue sliding scale insulin, follow sugars. I will check a hemoglobin A1c.  3. Hypertension, presently hemodynamically stable: Continue with HCTZ, losartan and verapamil.  4. Gastroesophageal reflux disease: Continue omeprazole.  5. Diabetic neuropathy: Continue Neurontin.  6. Obstructive sleep apnea: I will order a CPAP for him for bedtime.  7. Hypothyroidism: Continue Synthroid.  8. Bipolar disorder: Continue Geodon and Trileptal. The patient follows up with Dr. Imogene Burnhen as an outpatient.   Thank you so much for the consultation. I will follow along with you.    TIME SPENT: 50 minutes.  ____________________________ Rolly PancakeVivek J. Cherlynn KaiserSainani, MD vjs:cb D: 10/23/2012 11:48:30 ET T: 10/23/2012 12:29:26  ET JOB#: [id number]  cc: Rolly PancakeVivek J. Cherlynn KaiserSainani, MD, <Dictator> Houston SirenVIVEK J Mariyam Remington MD ELECTRONICALLY SIGNED 10/24/2012 14:08

## 2015-02-06 NOTE — Discharge Summary (Signed)
PATIENT NAME:  Adrian [name], Adrian [name] MR#:  [id number] DATE OF BIRTH:  05-23-1961  DATE OF ADMISSION:  11/15/2012 DATE OF DISCHARGE:    PRIMARY CARE PHYSICIAN: Dennison Mascot, MD   DISCHARGE DIAGNOSES:  1. Sepsis.  2. Abdominal wall cellulitis with methicillin-resistant Staphylococcus aureus.  3. Rhabdomyolysis.  4. Acute renal failure.  5. Hyponatremia.  6. Hypokalemia.  7. Morbid obesity.  8. Obstructive sleep apnea.  9. Hypertension.   CONSULTANTS: Dr. Juliann Pulse of surgery, Dr. Leavy Cella of ID, and physical therapy.   ADMITTING HISTORY AND PHYSICAL: Please see detailed H and P dictated by Dr. Randol Kern on 11/15/2012. In brief, a 54 year old male patient with significant past medical history of obstructive sleep apnea, hypertension, hypothyroidism, who presented to the hospital after he fell from a recliner. The patient had some mild right lower extremity pain. Was seen in the emergency room, was found to have hypokalemia of 3.2 with possible abdominal cellulitis and elevated leukocytosis of 25,000, acute renal failure, admitted to the hospitalist service.   HOSPITAL COURSE:  1. Abdominal cellulitis. The patient was started on broad-spectrum antibiotics of IV Zosyn and vancomycin. Dr. Leavy Cella with surgery saw the patient, who suggested changing his antibiotics to MRSA after his wound cultures grew Staph aureus. This turned out to be MRSA. The patient was seen by Dr. Juliann Pulse, who suggested starting nystatin, later switched to oral fluconazole, discussing with Dr. Leavy Cella. The patient's cellulitis is much improved, although healing in his abdomen is of concern as the patient is morbidly obese, weighing 490 pounds. He does have significant amount of pannus and skin folds which are difficult to keep dry, and healing will be an issue, and the patient is a high risk for readmission and future skin infections. For this, the patient is being set up for home health with nursing for wound care. The patient  will also follow up with the wound clinic.  2. Acute renal failure. The patient did have elevated creatinine of 1.8 secondary to sepsis, which has resolved with IV fluid resuscitation.  3. Hypertension. The patient's blood pressure medications were initially held secondary to acute renal failure, which are being restarted, and blood pressure is slowly trending down to a normal range.   On the day of discharge, the patient's temperature is 98.3. Recommended Bactrim for a total of 10 days and fluconazole for 5 more days, and is being discharged home in a fair condition with temperature 98.3.   DISCHARGE MEDICATIONS:  1. Omeprazole 20 mg oral once a day.  2. Levothyroxine 50 mcg oral once a day.  3. Hydrochlorothiazide/losartan 25/100 one tablet oral once a day.  4. Loratadine 10 mg oral once a day.  5. Gabapentin 300 mg oral 4 times a day.  6. Oxcarbazepine 300 mg oral 2 times a day.  7. Metformin 1000 mg oral 2 times a day.  8. Aspirin 325 mg oral once a day.  9. Ziprasidone 80 mg oral 2 times a day.  10. Torsemide 20 mg oral once a day.  11. Advair Diskus 250/50 one puff inhaled 2 times a day.  12. Ambien CR 12.5 mg 1 tablet oral once a day at bedtime as needed.  13. Singulair 10 mg oral once a day.  14. Tylenol 1000 mg oral once a day at bedtime.  15. Melatonin 3 mg 2 tablets orally once a day at bedtime.  16. Verapamil 200 mg 2 capsules oral once a day.  17. Centrum 1 tablet oral once a day.  18. Dulcolax  1 suppository rectally once a day as needed for constipation.  19. Metolazone 5 mg oral 2 times a day.  20. Spiriva 18 mcg inhaled once a day.  21. Albuterol 2 puffs inhaled every 4 hours.  22. Zetia 10 mg oral once a day.  23. Acetaminophen/oxycodone 325/5 one tablet oral every 6 hours as needed for pain.  24. Potassium chloride 20 mEq oral 2 tablets 2 times a day.  25. Nystatin 100,000 U apply topically to affected area 2 times a day.  26. Bactrim DS 800/160 two tablets orally  every 12 hours for 7 days.  27. Diflucan 200 mg oral once a day for 5 days.   DISCHARGE INSTRUCTIONS: The patient will follow up with his primary care physician within a week. Follow up with at the wound clinic and also follow with nursing for home health with wound care. The patient was seen by physical therapy, who recommended home with 24-hour supervision, and the patient will be with his family under supervision as discussed with the patient. He will be on a 1200 calorie, low salt diet. Activity as tolerated using a walker at all times.   TIME SPENT: Time spent on day of discharge in discharge activity was 40 minutes.   ____________________________ Molinda BailiffSrikar R. Frankee Gritz, MD srs:OSi D: 11/19/2012 14:38:07 ET T: 11/20/2012 09:24:32 ET JOB#: [id number]  cc: Wardell HeathSrikar R. Elpidio AnisSudini, MD, <Dictator> Dennison MascotLemont Morrisey, MD Wardell HeathSRIKAR [address] MD ELECTRONICALLY SIGNED 11/22/2012 13:25

## 2015-02-06 NOTE — Discharge Summary (Signed)
PATIENT NAME:  Adrian [name], Adrian [name] MR#:  [id number] DATE OF BIRTH:  August 29, 1961  DATE OF ADMISSION:  03/14/2013 DATE OF DISCHARGE:  03/18/2013  The patient is waiting for PASRR approval, so if that is approved he will go to [facility].   DISCHARGE DIAGNOSES: 1.  Cellulitis of the left leg.  2.  Pannus with fungal growth in the abdominal folds and around the groin area.  3.  Morbid obesity.  4.  Hypertension.  5.  Diabetes.  6.  Chronic obstructive pulmonary disease.   DISCHARGE MEDICATIONS: 1.  Omeprazole 20 mg p.o. daily. 2.  Levothyroxine 50 mcg p.o. daily. 3.  HCTZ/losartan 25/100 mg 1 tablet p.o. daily.  4.  Loratadine 10 mg p.o. daily.  5.  Neurontin 300 mg p.o. 4 times daily.  6.  Advair Diskus 250/50 one 1 puff b.i.d.  7.  Spiriva 18 mcg inhalation daily.  8.  Ambien 12.5 mg p.o. daily.  9.  Furosemide 20 mg p.o. b.i.d.  10.  Metformin 1 gram p.o. b.i.d.  11.  Verapamil 200 mg 2 capsules once a day.  12.  Astepro 2 sprays once a day.  13.  Oxcarbazepine 600 mg p.o. b.i.d.  14.  KCl 20 mEq p.o. t.i.d.  15.  Montelukast 10 mg p.o. daily.  16.  Nystatin application to the groin area twice daily for 10 days.  17.  Bactrim 2 tablets p.o. every 12 hours until June 14th.  18.  CPAP at bedtime for sleep apnea.  DIET: Low sodium, low fat ADA.  CONSULTATIONS: ID with Dr. Leavy [name] and physical therapy.  HOSPITAL COURSE: This is a 54 year old male with history of morbid obesity, hypertension, diabetes and bipolar disorder admitted because of generalized weakness. The patient's BMI is 61. The patient was found to have swelling of the left lower aspect of the pannus with worsening redness and spreading over upper aspect of the abdomen. The patient also had left leg cellulitis. The patient's white count was 22,000 on admission. He was admitted to the hospitalist service, started on Vanc and Zosyn.  The patient's temperature was 98.5 in the Emergency Room, heart rate 114. The  patient was also started on nystatin for the groin area and monitored on the floor. His blood cultures did not show any growth. Initial white count 22.1 and the patient's white count gradually came down.  Repeat white count is 13.2 on May 30th, hemoglobin 8.7 and hematocrit 25.7. The patient feels much better. Seen by Dr. Leavy [name] who recommends Bactrim for 2 weeks. The patient also needs to continue nystatin for the folds around the groin area.  The patient had previous MRSA infection.  The patient has pannus with what appears to be candida with severe ulcers. So the patient is going to have nystatin for infections and also continue Bactrim for 10 to 14 days. The patient understands that.  The patient is requesting placement as he lives with his elderly mother and sister. The patient is not able to walk recently because of his cellulitis.  The patient will be going to [facility] once PASRR is back.   Regarding his other conditions, the patient is on medication for blood pressure which can be continued. His blood pressure has been stable. The patient has been afebrile this morning. Other things, he has diabetes. He is on metformin. Hemoglobin A1c is less than 3.5 in the hospital.   For hypothyroidism, continue Synthroid.   For mood disorder, continue ziprasidone 80 mg p.o. b.i.d. with  food.  Condition is stable.   He will be going to [facility] when the arrangements are made.  TIME SPENT ON DISCHARGE PREPARATION: More than 30 minutes. ____________________________ Katha HammingSnehalatha Alexxa Sabet, MD sk:sb D: 03/18/2013 11:32:55 ET T: 03/18/2013 12:28:43 ET JOB#: [id number]  cc: Katha HammingSnehalatha Sache Sane, MD, <Dictator> Dennison MascotLemont Morrisey, MD Katha HammingSNEHALATHA Basim Bartnik MD ELECTRONICALLY SIGNED 03/27/2013 13:18

## 2015-02-06 NOTE — Consult Note (Signed)
PATIENT NAME:  Adrian [name], Adrian [name] MR#:  [id number] DATE OF BIRTH:  13-Jun-1961  DATE OF CONSULTATION:  11/15/2012  CONSULTING PHYSICIAN:  Cristal Deer A. Deante Blough, MD  CHIEF COMPLAINT:  Purulence from face wound, as well as abdominal wall cellulitis.   HISTORY OF PRESENT ILLNESS: Adrian [name] is a pleasant 54 year old male with a past medical history of super morbid obesity, obstructive sleep apnea, hypertension, hypothyroidism, bipolar disorder, diabetes, diabetic neuropathy and previous chin abscess who presents to the ED after a fall from a recliner. According to him, he fell on his right side and was noted to have a right abdominal wall, was believed to be cellulitis in the groin area upper thigh. He was noted to have a white cell count of 25,000. He was also noted to have still some purulent drainage from his chin abscess which had been previously drained by our group, [facility], otherwise he has had no headache, chest pain, fevers, chills, night sweats, shortness of breath, chin pain, chest pain, nausea, vomiting, diarrhea, constipation, drainage from wounds, dysuria or hematuria.   PAST MEDICAL HISTORY: 1.  Morbid obesity. 2.  Obstructive sleep apnea.  3.  Hypertension.  4.  Hypothyroidism.  5.  Bipolar disorder.  6.  Diabetes mellitus. 7.  Diabetic neuropathy.   ALLERGIES:  No known drug allergies.   SOCIAL HISTORY:  Denies smoking, alcohol or illicit drug use.  Lives at home with his mother.   FAMILY HISTORY: CHF in his mother. Father with CVA.   HOME MEDICINES:  Based on his recent hospitalization: 1.  Advair.  2.  Ambien.  3.  Astepro. 4.  Torsemide.  5.  Geodon.  6.  Metformin.  7.  Potassium.  8.  Singulair.  9.  Omeprazole.  10.  Trileptal.  11.  Synthroid.  12.  Aspirin.  13.  Tylenol.  14.  Hydrochlorothiazide/losartan. 15.  Melatonin.  16.  Loratadine. 17.  Verapamil.  18.  Centrum.  19.  Dulcolax.  20.  Neurontin.  21.  Phenmetrazine.   REVIEW OF  SYSTEMS:  Total 12-point review of systems positive and negatives as above.  PHYSICAL EXAMINATION: VITAL SIGNS: Temperature 98.1, pulse 97, blood pressure 139/77, respirations 20, 97% on room air.  GENERAL:  No acute distress, alert and oriented x 3.  EYES:  No scleral icterus. No conjunctivitis.  FACE:  Does have a small punctate area below his chin which is draining spontaneously. No erythema. No obvious fluid collection.  CHEST:  Lungs clear to auscultation, moving air well.  ABDOMEN:  Soft, nontender, nondistended. Does have what looks like cellulitis with punctate area in his groin crease on his right side. Infrapannicular crease is quite moist and there are some areas of superficial breakdown.  EXTREMITIES:  Does have leg swelling and erythema, but no obvious tenderness. No obvious fluctuance. No obvious drainage.  Strength 5/5. SENSATION:  Cranial nerves II through XII grossly intact.   LABORATORIES:  As follows:  I have personally reviewed all of Adrian [name] labs significant for a white cell count of 25.8 with a 92% neutrophil, hemoglobin 9.1, hematocrit 28, platelets 501. Renal panel significant for a creatinine of 1.84. Bicarb is 22.  IMAGING STUDIES:  Did have a right upper quadrant ultrasound due to increased AST to show 2 stones in the gallbladder. No evidence of acute cholecystitis. Normal common bile duct.   ASSESSMENT AND PLAN:  Adrian [name] is a pleasant 54 year old male with previous chin abscess. It appears to be draining spontaneously and not  in need of new skin incision and drainage. In addition, abdomen appears to me to be superficial fungal infection with moisture. Would recommend a drying agent with antifungal such as nystatin powder, would also recommend wound care consult. No need for surgical intervention.      ____________________________ Si Raiderhristopher A. Phyllis Abelson, MD cal:ce D: 11/15/2012 12:04:46 ET T: 11/15/2012 12:19:29 ET JOB#: [id number]  cc: Cristal Deerhristopher A.  Eldene Plocher, MD, <Dictator> Jarvis NewcomerHRISTOPHER A Brallan Denio MD ELECTRONICALLY SIGNED 11/17/2012 13:22

## 2015-02-06 NOTE — H&P (Signed)
PATIENT NAME:  Adrian [name], Adrian [name] MR#:  [id number] DATE OF BIRTH:  05/22/1961  DATE OF ADMISSION:  06/12/2013  ADMITTING DIAGNOSIS: Left leg swelling, erythema.   HISTORY OF PRESENT ILLNESS: The patient is a 54 year old morbidly obese white male with a BMI of 61, has history of bipolar disorder, diabetes, hypertension who was recently hospitalized with history of recent admission on 03/14/2013. At that time, he was diagnosed with left-sided lower extremity cellulitis. The patient also has chronic left leg swelling, who was in the skilled nursing facility for 2 months and was discharged in mid July, who has a home health nurse checking on him and following him.  She noticed his left leg was more red and warm to touch. Therefore, he was sent to the ED. The patient is noted to have cellulitis of his leg. He also, along with the cellulitis, has a creatinine that is elevated as well as his sodium is low. The patient states that he does not have a thermometer at home and is not sure if he had fevers. He does not have much pain in the left leg. He denies any chest pains. He has chronic shortness of breath. He denies any abdominal pain, nausea, vomiting or diarrhea. Denies any urinary symptoms. He is able to ambulate with a walker.   PAST MEDICAL HISTORY: Significant for: 1.  Hypertension.  2.  Hyperlipidemia.  3.  Diabetes.  4.  Morbid obesity.  5.  Obstructive sleep apnea, on CPAP.  6.  Hypothyroidism.  7.  Bipolar disorder.  8.  Diabetic neuropathy.  9.  Status post tonsillectomy.   ALLERGIES: None.   CURRENT MEDICATIONS: Acetaminophen/hydrocodone 325/5, 1 tab p.o. q. 8 p.r.n., gabapentin 300, 1 tab 4 times a day, hydrochlorothiazide/losartan 25/100 mg 1 tab p.o. daily, levothyroxine 50 mcg daily, loratadine 10 daily, metformin 1000, 1 tab p.o. b.i.d., montelukast 10 daily, oxcarbazepine 600, 1 tab p.o. b.i.d., KCl 20 mEq 1 tab p.o. t.i.d., Lasix 20, 1 tab p.o. b.i.d., verapamil 200, 2 tabs daily,  ziprasidone 80 mg, 1 tab p.o. b.i.d., Ambien 10 at bedtime p.r.n. sleep.   SOCIAL HISTORY: Does not smoke. Does not drink. No drugs. Lives with his mother   FAMILY HISTORY: Significant for congestive heart failure in his mother, father with history of CVA. He is deceased.   REVIEW OF SYSTEMS:   CONSTITUTIONAL: Denies any fevers. Complains of chronic fatigue, weakness. No pain. No weight loss. No weight gain.  EYES: No blurred or double vision. No pain. No redness. No inflammation. No glaucoma. No cataracts.  ENT: No tinnitus. No hearing loss. No sore throat, no difficulty swallowing.  RESPIRATORY: Denies any cough, wheezing, hemoptysis. Has chronic dyspnea, has history of sleep apnea.  CARDIOVASCULAR: Denies any chest pain or orthopnea. Has chronic edema involving his lower extremity, left greater than right.  GASTROINTESTINAL: Denies any nausea, vomiting, diarrhea. No abdominal pain. No hematemesis. No melena. No changes in bowel habits.  GENITOURINARY: Denies any dysuria, hematuria, renal calc or frequency.  ENDOCRINE: Denies any polyuria, nocturia. No increase in sweating, heat or cold intolerance. HEMATOLOGIC/LYMPHATIC: Denies anemia, easy bruisability or bleeding.  SKIN: No acne. Has chronic erythema involving the left leg, according to him. He also has an ulcer on the left leg.  MUSCULOSKELETAL: Denies any pain in the neck, back or shoulder.  NEUROLOGIC: No numbness. No CVA. No TIA.  PSYCHIATRIC: Has a history of anxiety and depression.   PHYSICAL EXAMINATION: VITAL SIGNS: Temperature 98.5, pulse 93, respirations 20, blood pressure 135/58, O2  99% on room air.  GENERAL: The patient is a morbidly obese male in no acute distress.  HEENT: Head atraumatic, normocephalic. Pupils equally round, reactive to light and accommodation. There is no conjunctival pallor. No scleral icterus. Nasal exam shows no drainage or ulceration. Oropharynx is clear without any exudate. External ear exam shows no  drainage or lesions.  Nose:  There are no lesions or drainage. NECK: Supple. No lymphadenopathy. No JVD. No carotid bruits.  CARDIOVASCULAR: Regular rate and rhythm. No murmurs, rubs, clicks or gallops.  LUNGS: Clear to auscultation bilaterally without any rales, rhonchi, wheezing.  ABDOMEN: Obese, soft, nontender, positive bowel sounds x 4. No guarding. No rebound.  EXTREMITY: He has got swelling involving his left leg with erythema and warmth.  NEUROLOGICAL: The patient is awake, alert, oriented x 3. No focal deficits.  PSYCHIATRIC: Not anxious or depressed.   PERTINENT LABS AND EVALUATIONS:  Admitting glucose 102, BUN 31, creatinine 1.33, sodium 121, potassium 2.7, chloride 82, CO2 is 27, calcium 8.2. His LFTs: Total protein 6.9, albumin 2.1, bili total 0.4, alk phos 146, AST is 281, ALT is 355. Doppler of his left leg shows left superficial femoral vein is incompletely visualized. Small (Dictation Anomaly)  DVT cannot be excluded, otherwise, no evidence of DVT in the left lower extremity. A Baker cyst is noted.   ASSESSMENT AND PLAN: The patient is a 54 year old morbidly obese male with history of hypertension, diabetes, COPD, chronic left leg swelling, erythema, has an ulcer on the left leg, presents with left-sided leg swelling, erythema and redness.   1.  Left leg cellulitis, recurrent, with an ulcer:  At this time, will treat him with IV Vanc, Zosyn. Blood cultures. We will also ask Wound Care nurse to see in terms of the ulcer.  2.  Diabetes:  Sliding-scale insulin.  Creatinine is up, so we will hold metformin.  3.  Acute renal failure and hyponatremia: We will hold his HCTZ/losartan therapy, follow his renal function and follow his sodium as well.  4.  Sleep apnea:  Will do CPAP at bedtime as is doing at home.  5.  Hypertension: We will continue verapamil.  6.  Hypothyroidism: Continue levothyroxine.  7.  Elevated liver function tests: We will check a hepatitis panel, repeat LFTs in the  morning. May need right upper quadrant ultrasound. Also, his medications will need to be evaluated for hepatic toxicity.  8.  Miscellaneous:  I will start him on Lovenox for DVT.   TIME SPENT: 50 minutes spent on this H and P.  ____________________________ Lafonda Mosses. Posey Pronto, MD shp:cb D: 06/12/2013 21:22:40 ET T: 06/12/2013 22:10:53 ET JOB#: [id number]  cc: Marshae Azam H. Posey Pronto, MD, <Dictator> Alric Seton MD ELECTRONICALLY SIGNED 06/18/2013 8:36

## 2015-02-06 NOTE — Consult Note (Signed)
Impression: 54yo male w/ h/o diabetes, Methacillin Resistant Staph aureus skin infection admitted with cellulitis of LLE and pannus.  He was previously admitted for Methacillin Resistant Staph aureus in a chin lesion.  That lesion has healed. The area under his pannus appears to be Candidal with fairly severe ulceration.  This is not much changed from several months ago.  This area will be difficult to heal given the extensive pannus and diabetes making a perfect habitat for yeast. Will continue vanco for another day.  If his BCx remain negative, will change to TMP/SMX. Would treat for 10 days. Use nystatin for the yeast infection. Would encourage good glycemic control. d/c zosyn.  Electronic Signatures: Carver Murakami MPH, Rosalyn GessMichael E (MD)  (Signed on 30-May-14 17:28)  Authored  Last Updated: 30-May-14 17:28 by Susana Duell MPH, Rosalyn GessMichael E (MD)

## 2015-02-06 NOTE — Discharge Summary (Signed)
ADDENDUM  PATIENT NAME:  Adrian [name], Adrian [name] MR#:  [id number] DATE OF BIRTH:  Apr 21, 1961  DATE OF ADMISSION:  11/15/2012 DATE OF DISCHARGE:    The patient was seen by physical therapy prior to discharge and the patient needed significant help in controlling his obesity and requirement for further physical therapy and help with activities of daily living. The patient is being discharged to a skilled nursing facility. The patient, over the last 2 days, has done well without any fever, good appetite, no shortness of breath and using the CPAP at night.    DISCHARGE MEDICATIONS: Remain the same   DISCHARGE INSTRUCTIONS: Remain the same.  Foley to be kept till his wound heals as he has incontinence. Conitnue CPAP at night  TIME SPENT: Time spent on day of discharge in coordination of care and discharge activity was 35 minutes.     ____________________________ Molinda BailiffSrikar R. Shaneese Tait, MD srs:aw D: 11/22/2012 12:34:25 ET T: 11/22/2012 12:40:03 ET JOB#: [id number]  cc: Wardell HeathSrikar R. Shanaia Sievers, MD, <Dictator> Orie FishermanSRIKAR R Kyannah Climer MD ELECTRONICALLY SIGNED 11/22/2012 13:24

## 2015-02-06 NOTE — Consult Note (Signed)
Details:   - full note to be dictated. 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 Xray shows prominent bone lateral with chronic charcot changes. Ulcer with necrotic tissue, mild purulence with bone protruding. WBC 27K.  Deep culture performed. Pt will need MRI to assess extent of infection.  Highly suspect osteomyelitis.   High risk of BKA due to extent of bone prominence, necrosis, neuropathy, cellulitis.   Electronic Signatures: [name], Beuford Garcilazo (MD)  (Signed 19-Dec-14 17:15)  Authored: Details   Last Updated: 19-Dec-14 17:15 by [name], Madoline Bhatt (MD)

## 2015-02-06 NOTE — H&P (Signed)
PATIENT NAME:  Adrian [name], Adrian [name] MR#:  [id number] DATE OF BIRTH:  19-Sep-1961  DATE OF ADMISSION:  11/15/2012  REFERRING PHYSICIAN: Bayard Males, MD  PRIMARY CARE PHYSICIAN: Dennison Mascot, MD  CHIEF COMPLAINT: Status post fall from recliner, found to have abdominal wall cellulitis.   HISTORY OF PRESENT ILLNESS: This is a 54 year old male with significant past medical history of obstructive sleep apnea, super morbid obesity, obstructive sleep apnea on CPAP, hypertension, hypothyroidism, bipolar disorder, diabetes and diabetic neuropathy who presents to the ED after fall from a recliner. The patient reports that his family lifted the patient where he fell on his right side, complaining of some mild right lower extremity pain. The patient did not have any deformity or any abnormalities on the physical examof the rifgt leg, but the patient was noticed to be in acute renal failure with creatinine of 1.84 with his last creatinine before 3 weeks which was 0.88. As well, he was found to have hypokalemia, with potassium at 3.2, and was found to have significant leukocytosis of 25,000. As well he had low hemoglobin at 9.1, which appears to be his baseline, without significant change from previous. As well he was found to have elevated AST. As mentioned earlier, the patient was noticed to have elevated leukocytosis at 25,000. On physical exam, he was noticed to have significant cellulitis in the groin area and in the upper thigh and in the abdominal wall as well. So the hospitalist service was requested to admit the patient for IV antibiotic administration for his abdominal wall cellulitis. As well the patient was noticed to have purulent discharge from the area of a chin abscess which was recently incised and drained as well. The patient denies any chest pain, any shortness of breath, any fever, chills or coffee-ground emesis.  PAST MEDICAL HISTORY: 1. Morbid obesity.  2. Obstructive sleep apnea.   3. Hypertension.  4. Hypothyroidism.  5. Bipolar disorder.  6. Diabetes.  7. Diabetic neuropathy.   ALLERGIES: No known drug allergies.  SOCIAL HISTORY: No smoking, alcohol use. No illicit drug use. Lives at home with his mother.   FAMILY HISTORY: Significant for congestive heart failure in his mother's side and father had history of CVA.   CURRENT MEDICATIONS: Currently the patient does not recall his medication, he did not bring it with him, so had to rely on his recent medication during recent hospitalization where he was taking the following. 1. Advair 250/50 one puff p.o. 2 times a day. 2. Ambien 12.5 mg at bedtime. 3. Astepro one spray nasally 2 times a day. 4. Torsemide 20 mg 2 times a day. 5. Geodon 80 mg 2 times a day. 6. Metformin 1000 mg p.o. 2 times a day. 7. Potassium 20 milliequivalents 3 times daily. 8. Singulair 10 mg daily. 9. Omeprazole 20 mg daily. 10. Trileptal 300 mg 2 times a day. 11. Synthroid 50 mcg daily. 12. Aspirin 325 mg daily. 13. Tylenol extra-strength at bedtime. 14. Hydrochlorothiazide/losartan 25/100 mg 2 tablets daily. 15. Melatonin 3 mg 2 tablets at bedtime. 16. Loratadine 10 mg daily. 17. Verapamil 200 mg 2 capsules daily. 18. Centrum multivitamin daily. 19. Dulcolax suppository daily as needed. 20. Neurontin 300 mg p.o. 4 times daily. 21. Phenmetrazine 105 mg oral 30 minutes before breakfast.   REVIEW OF SYSTEMS:  CONSTITUTIONAL: The patient denies any fever, chills. Complains of generalized weakness and fatigue.  EYES: Denies blurry vision, double vision, pain, inflammation or glaucoma.  ENT: Denies tinnitus, ear pain, hearing loss, discharge or  epistaxis.  RESPIRATORY: Denies cough, wheezing, hemoptysis, dyspnea or COPD. CARDIOVASCULAR: Denies chest pain, arrhythmia, palpitations or syncope. Reports chronic lower extremity edema.  GASTROINTESTINAL: Denies nausea, vomiting, diarrhea, abdominal pain, hematemesis, melena, jaundice or  hemorrhoids.  GENITOURINARY: Denies dysuria, hematuria or renal colic. ENDOCRINE: Denies polyuria, polydipsia, heat or cold intolerance.  HEMATOLOGY: Denies anemia, easy bruising or bleeding diathesis.  INTEGUMENTARY: Denies any acne. Has rash and erythema on lower abdominal wall and thigh and groin area.  MUSCULOSKELETAL: Denies any history of arthritis, cramps or gout. Has limited activity due to morbid obesity. Ambulates with a walker in the house.  NEUROLOGY: Denies any dysarthria, epilepsy, tremors, vertigo, ataxia, dementia, CVA or TIA.  PSYCHIATRIC: Denies anxiety, schizophrenia, substance or alcohol abuse. Reports history of bipolar disorder.   PHYSICAL EXAMINATION: VITAL SIGNS: Temperature 97.5, pulse 96, respiratory rate 20, blood pressure 137/62 and saturating 92% on room air.  GENERAL: Morbidly obese male who lies comfortably in the bed, in no apparent distress, sleeping soundly.  HEENT: Head is atraumatic, normocephalic. Extraocular muscles are intact. Pink conjunctivae. Anicteric sclerae. Moist oral mucosa.  NECK: Supple. No thyromegaly. No JVD. Has in the submental/inferior chin area some purulent discharge from previous abscess sight.  CHEST: Clear to auscultation bilaterally from anterior surface. No rales, rhonchi or wheezing.  CARDIOVASCULAR: S1 and S2 heard. No rubs, murmurs or gallops.  ABDOMEN: Obese, soft, nontender and nondistended.  EXTREMITIES: No clubbing. No cyanosis. Does have significant peripheral edema, +2 to +3 bilaterally, with some lower extremity ulcerative wounds.  SKIN: The patient had an area of erythema, swelling and tenderness in the groin, upper thigh and lower abdominal area with some superficial ulceration and minimal bleed  LYMPHATIC: There is no cervical or axillary lymphadenopathy.  NEUROLOGIC: Cranial nerves grossly intact. Motor  5 out of 5. No focal deficit appreciated.  PSYCHIATRIC: Appropriate affect. Awake and alert x 3. Intact judgment and  insight.   PERTINENT LABS: Glucose 101, BUN 31, creatinine 1.84, sodium 124, potassium 3.2, chloride 83, CO2 22, AST 178, ALT 72 and alkaline phosphatase 105. White blood cell count 25.8, hemoglobin 9.1, hematocrit 28 and platelets 501.   ASSESSMENT AND PLAN: 1. Abdominal wall and upper thigh cellulitis. We will start the patient on broad-spectrum IV antibiotics, vancomycin and Zosyn. We will consult pharmacy to dose with his renal failure. As well appears to be fungal component to it so we will have him on nystatin powder. We will follow on blood cultures.  2. Recent submental chin abscess with recent incision and drainage. The patient has some purulent discharge. He is already covered on broad-spectrum IV antibiotics. We will consult surgical service to see if there is any intervention needed.  3. Acute renal failure. We will hold metformin, we will hold hydrochlorothiazide, we will hold losartan and we will hold torsemide. We will continue with gentle hydration.  4. Hypokalemia. We will replace.  5. Hyponatremia and hypokalemia. This is most likely related to dehydration and diuresis. We will continue with normal saline and we will recheck level in a.m.  6. Obstructive sleep apnea. Continue with CPAP.  7. Hypertension. Acceptable. Continue home medication.  8. Hypothyroidism. Continue with Synthroid.  9. Bipolar disorder. Continue home medication.  10. Diabetes. Hold all oral hyperglycemic agents secondary to his acute renal failure. We will start on insulin sliding scale.  11. Diabetic neuropathy. Continue with Neurontin.  12. Deep vein thrombosis prophylaxis. Subcutaneous heparin. 13. Gastrointestinal prophylaxis. PPI.   CODE STATUS: FULL CODE.   TOTAL TIME SPENT  ON ADMISSION AND PATIENT CARE: 60 minutes.  ____________________________ Starleen Arms, MD dse:sb D: 11/15/2012 05:10:16 ET T: 11/15/2012 08:31:11 ET JOB#: [id number]  cc: Starleen Arms, MD, <Dictator> Denise Bramblett Teena Irani MD ELECTRONICALLY SIGNED 11/19/2012 2:13

## 2015-02-06 NOTE — Consult Note (Signed)
Impression: 54yo male w/ h/o diabetes admitted after a fall who has Staph abscess in chin and Candidal cellulitis under pannus.  He initially underwent I&D on 1/7.  Cultures from that procedure grew only skin flora.  His most recent culture grew Staph aureus.  Sensitivities are pending. The area under his pannus appears to be Candidal with fairly severe ulceration.  This area will be difficult to heal given the extensive pannus and diabetes making a perfect habitat for yeast. Will change his antibiotics to TMP/SMX.  If the Staph aureus is Methacillin Sensitive Staph aureus, then would change to keflex (would ask pharmacy to dose for his weight). Would treat for 10 days. Will add fluconazole to the nystatin for 7 days.   Focus on good glycemic control. His creatinine is back to normal and his LFTs are mildly elevated.  No need to adjust TMP/SMX for the kidney function or fluconazole dose for the hepatic function.   Electronic Signatures: Stepehn Eckard, Rosalyn GessMichael E (MD) (Signed on 31-Jan-14 15:55)  Authored   Last Updated: 31-Jan-14 16:07 by Waunita Sandstrom, Rosalyn GessMichael E (MD)

## 2015-02-06 NOTE — Consult Note (Signed)
PATIENT NAME:  Adrian [name], Ismaeel B MR#:  [id number] DATE OF BIRTH:  [birth year]  DATE OF CONSULTATION:  03/15/2013  REFERRING PHYSICIAN:  Katha HammingSnehalatha Konidena, MD CONSULTING PHYSICIAN:  Rosalyn GessMichael E. Ival Pacer, MD  REASON FOR CONSULTATION: Cellulitis.   HISTORY OF PRESENT ILLNESS: The patient is a 54 year old male with a past history significant for diabetes and methicillin Staphylococcus aureus skin infection, who was admitted on 03/14/2013 with generalized malaise and erythema under the pannus. The patient is a poor historian and could not give much more history. He denies having any significant fevers, chills, or sweats. He is morbidly obese and is unable to clean the area under the pannus. The patient was started on vancomycin and Zosyn.   ALLERGIES: None.   PAST MEDICAL HISTORY:  1.  Hypertension.  2.  Hypercholesterolemia.  3.  Diabetes.  4.  Morbid obesity.  5.  Sleep apnea.  6.  Hypothyroidism.  7.  Bipolar disorder.  8.  Diabetic nephropathy.  9.  Staphylococcus aureus, cutaneous infection of the chin in February 2014.   SOCIAL HISTORY: The patient lives with his mother and his sister. He does not smoke. He does not drink. No injecting drug use history.   FAMILY HISTORY: Positive for CHF and stroke.   REVIEW OF SYSTEMS:  GENERAL: The patient denies any fevers, chills, or sweats.  HEENT: The chin lesion has improved and is no longer draining.  RESPIRATORY: No cough. No shortness of breath. No sputum production.  CARDIAC: No chest pains or palpitations.  GASTROINTESTINAL: No nausea, no vomiting, no change in his bowels.  GENITOURINARY: No change in his urine.  SKIN: He has had chronic redness and discomfort under the pannus area. He has not noted any other erythema, although his left lower extremity is erythematous.  NEUROLOGIC: He has had some pain in his feet as well as some ulcers that have been present.   PHYSICAL EXAMINATION:  VITAL SIGNS: T-max 100.9, T-current 99.1, pulse  102, blood pressure 126/75, 96% on room air.  GENERAL: A 54 year old morbidly obese white male in no acute distress.  HEENT: Normocephalic, atraumatic.  LUNGS: Clear to auscultation bilaterally with good air movement. No focal consolidation.  HEART: Regular rate and rhythm without murmur, rub or gallop. Distant S1 and S2.  ABDOMEN: Soft, nontender, and nondistended. No hepatosplenomegaly. No hernia is noted. Morbidly obese.  MUSCULOSKELETAL: He has an ulcer on the lateral aspect of his left foot, which is in a dependent position given the way he lies.  This is covered with DuoDERM and was not directly observed.  The left lower extremity was significantly erythematous. It was not particularly swollen. Erythema extended all the way up through the thigh. It was slightly warm to touch.  SKIN: Other than the rash on the left lower extremity, he has extensive erythema and thickening in the pannus. There are areas of ulceration and moist macerated areas underneath the pannus.  NEUROLOGIC: The patient was awake and interactive, moving his upper extremities.  PSYCHIATRIC: Mood and affect appeared normal.   LABORATORY DATA:  BUN of 12, creatinine 0.97, bicarbonate of 33, anion gap of 6. LFTs from admission are unremarkable. White count of 13.2, hemoglobin 8.7, platelet count of 528, ANC of 10.5. White count on admission was 22.1. Blood cultures show no growth to date. Urinalysis was unremarkable. No radiographic imaging is available for review.   IMPRESSION: A 54 year old male with a history of diabetes, Methicillin-resistant Staphylococcus aureus skin infection who was admitted with cellulitis of the left lower  extremity and the pannus.   RECOMMENDATIONS:  1.  He was previously admitted for MRSA of a chin lesion. That lesion has healed.  2.  The area under his pannus appears to be candidal with fairly severe ulceration. This is not much changed from several months ago. This area will be difficult to heal  given that he has extensive pannus and diabetes make a perfect habitat for yeast.  3.  We will continue vancomycin for another day. If his blood cultures remain negative, we will change to trimethoprim and sulfamethoxazole.  4.  Would plan on treating for 10 days.  5.  Would use nystatin for the yeast infection.  6.  Would encourage good glycemic control.  7.  We will discontinue Zosyn.    This is a low level infectious disease consult. Thank you very much for involving me in Mr. Jaquith care.  ____________________________ Rosalyn Gess. Leevon Upperman, MD meb:ap D: 03/15/2013 17:34:28 ET T: 03/15/2013 21:48:54 ET JOB#: [id number]  cc: Rosalyn Gess. Shaylea Ucci, MD, <Dictator> Claudis Giovanelli E Tatelyn Vanhecke MD ELECTRONICALLY SIGNED 03/19/2013 11:43

## 2015-02-06 NOTE — Consult Note (Signed)
PATIENT NAME:  Adrian [name], Everton B MR#:  [id number] DATE OF BIRTH:  June 09, 1961  DATE OF CONSULTATION:  11/16/2012  REFERRING PHYSICIAN:  Katharina Caperima Vaickute, MD CONSULTING PHYSICIAN:  Rosalyn GessMichael E. Kissie Ziolkowski, MD  REASON FOR CONSULTATION: Staph abscess on the chin and abdominal cellulitis.   HISTORY OF PRESENT ILLNESS: The patient is a 54 year old male with a past history significant for diabetes and bipolar disorder who was admitted on January 30 after having a fall from a recliner and noticing inflammation in the pannus area. The patient apparently had had a chin abscess that was drained on January 7 during a hospitalization. At that time, cultures grew skin flora. He was readmitted after he fell from his recliner chair. He was having some pain and he was eventually brought to the Emergency Room. His creatinine on admission was elevated and he was found to have a white count of 25.8. On the day of his prior surgery, his white count was down to 12.2. He is a fairly poor historian and was not able to give much history. He denies any fevers, chills or sweats. He has had no nausea or vomiting. He denies any confusion. He was unable to say how long he had had the lesion on his chin or the redness around his abdomen. Most of the history is obtained from the chart.   ALLERGIES: None.   PAST MEDICAL HISTORY:  1.  Morbid obesity.  2.  Obstructive sleep apnea.  3.  Hypertension.  4.  Hypothyroidism.  5.  Bipolar disorder.  6.  Diabetes with neuropathy.   SOCIAL HISTORY: The patient lives with his mother and his sister. He does not smoke. He does not drink. No injecting drug use history.   FAMILY HISTORY: Positive for CHF and stroke.   REVIEW OF SYSTEMS:  GENERAL: The patient denies any fevers, chills or sweats.  HEENT: He has had the draining lesion from his chin.  GASTROINTESTINAL: He has not had any nausea, vomiting or change in his bowels.  SKIN: He is unable to tell me how long he has had his skin rash on  his abdomen. It is moderately uncomfortable, however.  RESPIRATORY: He denies any cough or shortness of breath.  CARDIAC: No chest discomfort. Further history was difficult to obtain.   PHYSICAL EXAMINATION:  VITAL SIGNS: T-max 99.6, T-current 99.4, pulse 95, blood pressure 118/74, 97% on room air.  GENERAL: A morbidly obese 54 year old white man in no acute distress.  HEENT: Normocephalic, atraumatic. Oropharynx shows extremely poor dentition with several teeth missing.  NECK: Demonstrates a lesion on the chin with honeycombing. There was no obvious fluctuance, but there was some thickening of the skin. There was no obvious purulent drainage at this time. There was some surrounding erythema. The area was not tender to palpation.  LUNGS: Clear to auscultation bilaterally with good air movement. No focal consolidation.  HEART: Regular rate and rhythm without murmur, rub or gallop.  ABDOMEN: Obese, soft, nondistended. No hepatosplenomegaly. No hernias noted.  EXTREMITIES: No evidence for tenosynovitis.  SKIN: In addition to the lesion on the chin, he had A significant moist erythematous rash underneath the pannus bilaterally. On the left side of the abdomen, there was an ulcerative area that was linear along the fold in the skin. The ulceration was fairly superficial. There was no purulent drainage from it. The area had a yeasty odor and had significant erythema surrounding it. There was an area superior to this that looked like a prior surgical wound, but the  patient had denied any prior surgeries. There were no other rashes appreciated.  NEUROLOGIC: The patient was awake and interactive, but an extremely poor historian. He appeared to be mentating well, however. He was able to move his upper extremities easily. He had more difficulty moving his lower extremities, which appeared to be more related to his obesity than specific weakness.  PSYCHIATRIC: Mood appeared pleasant.   LABORATORY, DIAGNOSTIC,  AND RADIOLOGICAL DATA: BUN 18, creatinine 0.98, AST 187, ALT 89, alkaline phosphatase 97, total bilirubin 0.4. CPK 2139. LFTs on January 6 were normal. White count today was 12.3 with hemoglobin 9.1, platelet count 447, ANC 10.5. White count on admission was 25.8. White count after surgery on January 7 was 12.2, prior to surgery was 15.3. Right eye culture is pending. A wound culture from the chin is growing Staph aureus, sensitivities of which are pending. Blood cultures from admission show no growth. Blood cultures from January 6 show no growth, and a wound culture from January 6 shows normal flora. Urinalysis from this hospitalization shows no growth. Abdominal ultrasound shows stones within the gallbladder with no evidence of acute cholecystitis. The common bile duct was normal in size. The liver was enlarged.   IMPRESSION: A 54 year old male with a past history significant for diabetes who was admitted after a fall, who has a staph abscess on the chin and candidal cellulitis under the pannus.   RECOMMENDATIONS:  1.  He initially underwent I and D on January 7. Cultures from that procedure grew only skin flora. His most recent culture grew Staph aureus. Sensitivities are pending.  2.  The area under his pannus appears to be candidal with fairly severe ulceration. The area will be difficult to heal given the extensive pannus and diabetes making a perfect habitat for yeast.  3.  Will change his antibiotics to trimethoprim and sulfamethoxazole. If the Staph aureus is methicillin sensitive staph aureus, then would change to Keflex (would ask pharmacy to dose for his weight). Will treat for 10 days.  4.  Will add fluconazole to the nystatin that he is receiving for an additional 7 days.  5.  Would focus on good glycemic control.  6.  His creatinine is back to normal and his LFTs are mildly elevated. There will not be any need to adjust Bactrim dose for the kidney function or fluconazole dose for the hepatic  function.   This is a low level infectious disease case. Thank you very much for involving me in this patient's care.    ____________________________ Rosalyn Gess. Haskel Dewalt, MD meb:jm D: 11/16/2012 16:07:16 ET T: 11/16/2012 20:04:15 ET JOB#: [id number]  cc: Rosalyn Gess. Chamar Broughton, MD, <Dictator> Pavel Gadd E Porche Steinberger MD ELECTRONICALLY SIGNED 11/19/2012 9:15

## 2015-02-07 NOTE — Discharge Summary (Signed)
PATIENT NAME:  Adrian [name], Adrian [name] MR#:  [id number] DATE OF BIRTH:  01-14-1961  DATE OF ADMISSION:  10/04/2013 DATE OF EXPECTED DISCHARGE:  10/04/2013   ADMITTING DIAGNOSIS: Left foot cellulitis.  DISCHARGE DIAGNOSES:  1.  Left foot osteomyelitis, related to left foot osteomyelitis.  2.  Acute renal failure.  3.  Leukocytosis.  4.  Diabetes mellitus.  5.  Hypertension.  6.  Hypothyroidism.  7.  Constipation.  8.  Elevated transaminases, likely fatty liver.  9.   Morbid obesity. 10.  History of obstructive sleep apnea.   11.  History of methicillin-resistant Staphylococcus aureus abdominal wall cellulitis in February 2014.  12.  History of frequent left leg cellulitis, and admission for the same in August, September, as well as January and February 2014.  13.  History of chronic obstructive pulmonary disease.  14.  History of bipolar affective disorder.  15.  Cholelithiasis.  16.  Obstructive sleep apnea on CPAP at home.   DISCHARGE CONDITION: Stable.   DISCHARGE MEDICATIONS: The patient is to resume Acetaminophen/hydrocodone 325/5 mg 1 p.o. every eight hours as needed, gabapentin 300 mg 4 times daily, levothyroxine 75 mcg p.o. daily, Loratadine 10 mg p.o. daily, montelukast 10 mg p.o. daily, verapamil 200 extended release 2 capsules once daily, ziprasidone, which is Geodon, 80 mg twice daily, zolpidem 10 mg p.o. at bedtime. The patient is not to take furosemide, potassium, HCTZ, losartan or metformin unless recommended by primary care physician.   CONSULTANTS: Dr. Ether GriffinsFowler, radiologic service.  DIAGNOSTIC STUDIES: Pelvis AP x-ray, 04 October 2013, showed a very limited exam due to inability to move patient. Degenerative changes present in lumbar spine as well as both hips. Large amount of stool in the colon, suggesting constipation.   Chest, portable single view, 04 October 2013, revealed mild stable hyperinflation suggesting chronic obstructive pulmonary disease, mild enlargement of  cardiac silhouette, with prominence of the pulmonary vascularity suggestive of low-grade congestive heart failure. This appears to be compensated. There is no evidence of pneumonia or pleural effusion.   Left foot AP and lateral, 04 October 2013, revealed a left Charcot's foot, primarily involving the mid foot, with cortical irregularity at the base of the fifth metatarsal, which may be secondary to bone remodeling, given the fracture at the base of the fifth metatarsal and a Charcot joint versus osteomyelitis, given the overlying soft tissue wound. There is soft tissue swelling surrounding the left foot, which may reflect cellulitis.   HOSPITAL COURSE: The patient is a 54 year old Caucasian male with history of morbid obesity, who presents to the hospital with complaints of feeling weak. Please refer to Dr. Arlys JohnVaickute's admission note on 04 October 2013. Apparently, up until approximately a week ago, he was able to walk around. However, over the past one week, he has been so weak that he has been bedbound. On arrival to the Emergency Room, he was noted to have left foot ulcer with foul drainage. He was afebrile, however noted to be tachycardiac in the hospital. Also found to be in acute renal failure and had white blood cell count of more than 27,000.   Hospitalist services were contacted for admission. Physical exam revealed left lateral aspect of her foot, big wound of approximately 1.5 inches in diameter, deep, approximately 5 to 7 mm by visual estimation, draining foul-smelling drainage with purulent yellow slaugh on the base. Unable to palpate pulses due to significant edema.   The patient's lab data done in the Emergency Room revealed a BUN and creatinine of  28 and 2.15, sodium 127, glucose 120, bicarbonate level was 19. Estimated GFR for non-African American would be 34. The patient's hemoglobin A1c was found to be 5.7, ammonia level was 29. Liver enzymes revealed total bilirubin of 3.0, alkaline  phosphatase 131, AST 154, otherwise liver enzymes were normal. Troponin was 0.04. TSH was elevated at 5.95. White blood cell count was 27.6, hemoglobin was 10.6 and platelet count was 533. Blood cultures taken on the same day, on 04 October 2013, at around 12:30 p.m., showed no growth so far. Urinalysis done 04 October 2013, revealed yellow hazy urine, negative for glucose, bilirubin or ketones. Specific gravity was 1.011, pH was 5.0, 3+ blood, 100 dL protein, negative for nitrites or leukocyte esterase, 2 red blood cells, 2 white blood cells, trace bacteria, no epithelial cells were present and mucus was present. Chest x-ray was unremarkable. The patient's foot x-ray revealed bone changes.   Consultation with Dr. Ether Griffins, a podiatrist, was obtained. Dr. Ether Griffins saw the patient in consultation the same day, felt that the patient has diabetic neuropathic ulcer of left foot with Charcot changes of the foot. Ulcer showed necrotic tissue, mild purulence, with bone protruding. We did cultures, also recommended to get MRI to assess the extent of the infection. He highly suspected osteomyelitis. He felt that the patient was at high risk of BKA, with the extent of bone prominence, necrosis as well as neuropathy and cellulitis.   For this reason, we were trying to reach other facilities to get patient to transfer, but because of his size, he was not easy to transfer, since facilities such as [facility] and even [facility] did not have MRI to fit his needs. We contacted [facility] hospitalist program, who graciously accepted this patient for transfer for management of his left foot osteomyelitis. Patient's vital signs upon transfer: Temperature was 100.1 at 2125, pulse 106, respiratory rate was 22, blood pressure 148/72, saturation was 96% on 1-1/2 liters of oxygen with CPAP.   TIME SPENT: 40 minutes.      ____________________________ Katharina Caper, MD rv:cg D: 10/04/2013 23:18:50 ET T: 10/04/2013  23:48:02 ET JOB#: [id number]  cc: Katharina Caper, MD, <Dictator> Kalief Kattner MD ELECTRONICALLY SIGNED 10/22/2013 14:29

## 2015-02-07 NOTE — Consult Note (Signed)
PATIENT NAME:  Adrian [name], Adrian [name] MR#:  [id number] DATE OF BIRTH:  [birth year]  DATE OF CONSULTATION:  10/04/2013  REFERRING PHYSICIAN:   CONSULTING PHYSICIAN:  Clydine Parkison A. Ether Griffins, DPM  REASON FOR CONSULTATION:  Left foot ulcer.   HISTORY OF PRESENT ILLNESS:  This is a 54 year old gentleman who was admitted with generalized weakness, inability to ambulate with worsening of symptoms.  He is morbidly obese and minimally walks around, that he is virtually bedbound at this point.  Was brought into the ER with a complaint of left foot and leg pain.  Was noted to have a large ulcer on the plantar aspect of his left foot.  He was admitted with leukocytosis by the hospitalist and we were consulted for further evaluation.   PAST MEDICAL HISTORY: 1.  Bipolar disorder.  2.  Hypothyroid.  3.  Diabetes with neuropathy.  4.  COPD.  5.  History of lower extremity cellulitis.  6.  History of acute renal failure.  7.  Morbid obesity.  8.  Sleep apnea on CPAP.  9.  Hypertension.   MEDICATIONS: 1.  Acetaminophen.  2.  Gabapentin.  3.  HCTZ.  4.  Levothyroxine.  5.  Loratadine. 6.  Metformin.  7.  Montelukast.  8.  Potassium chloride.  9.  Torsemide.  10.  Verapamil.  11.  Ziprasidone.  12.  Zolpidem.   SOCIAL HISTORY:  He lives at home with his mother and sister.  He denies smoking or alcohol.   REVIEW OF SYSTEMS:  He denies any fever or chills.  He is virtually bedbound, does complain of lower extremity pain.    PHYSICAL EXAMINATION: GENERAL:  He is alert and oriented.  VASCULAR:  He has nonpalpable pulses secondary to edema to the lower extremities.  Bilateral feet are warm with brisk cap fill time.  NEUROLOGIC:  He is completely neuropathic from the ankle down with gross light touch and protective sensation completely absent.  DERMATOLOGIC:  On his right foot there is no ulcerations, just mild edema with mild stasis cellulitis.  His left lower extremity has cellulitis to the lower extremity.   He has a noted large full-thickness ulceration on the lateral aspect of the fifth metatarsal region.  There is noted bony prominence protruding through the ulceration.  There was an area in the central aspect that probed through the necrotic tissue, just a scant amount of purulent drainage was noted in the region.  A wound culture was taken of this deeper probing area.  The bone seemed soft and brittle protruding in the area.  Mild foul odor from the region was noted.  The surrounding dorsal and plantar foot were severely erythematous.  No bullae or infected blisters were noted today.  MUSCULOSKELETAL:  Right foot has mild to moderate edema to the foot, severe edema to the lower extremities consistent with his morbid obesity.  His left lower extremity is even more edematous secondary to the cellulitis and infection.  He has noted Charcot changes of the foot with a very prominent lateral column at the cuboid region.  I detected a little bit of crepitus with midfoot range of motion.   LABORATORY DATA:  White blood cell count of 27,000, hemoglobin 10.6, hematocrit 33.3, his platelet count was 533, BUN 28, creatinine 2.15.  Hemoglobin A1c was 5.7.   ASSESSMENT: 1.  Diabetic neuropathic foot ulceration, left foot.   2.  History of chronic Charcot changes.  3.  Likely osteomyelitis with cellulitis.   PLAN:  A deep  wound culture was performed today.  I am going to order an MRI of this left foot.  We will try to get this performed tomorrow.  Continue with current antibiotic therapy.  Depending on the extent of osteomyelitis and infection, he may undergo debridement with wound VAC placement versus possible amputation if there is severe destructive changes throughout the mid foot.  I will follow up on him tomorrow.     ____________________________ Argentina DonovanJustin A. Ether GriffinsFowler, DPM jaf:ea D: 10/04/2013 17:22:29 ET T: 10/04/2013 20:56:48 ET JOB#: [id number]  cc: Jill AlexandersJustin A. Ether GriffinsFowler, DPM, <Dictator> Carlesha Seiple  DPM ELECTRONICALLY SIGNED 11/07/2013 10:12

## 2015-02-07 NOTE — H&P (Signed)
PATIENT NAME:  Joelyn OmsBOLAND, Kacin B MR#:  [id number] DATE OF BIRTH:  [birth year]  DATE OF ADMISSION:  04/25/2014  PRIMARY DOCTOR:   Dennison MascotLemont Morrisey, MD.  EMERGENCY ROOM PHYSICIAN:  Lowella FairyJohn Woodruff, MD.   CHIEF COMPLAINT:  Foot ulcer.   HISTORY OF PRESENT ILLNESS: The patient is a 54 year old obese male with history of hypertension, diabetes, brought in from home because the patient is having sore on the bottom of the left foot. The patient says that it has been going on for a year, but recently got worse, associated with ambulatory difficulties. The patient was seen by a doctor making a house call yesterday and was sent to the Emergency Room today because they thought it is an infection up to the bone and he probably needed surgery. The patient has been having the drainage and pain in the left foot. He denies nausea, vomiting.  No fever, no cough. No other symptoms.  Noticed a small ulcer on bottom of the left foot for a year, but recently got worse.  The patient is at home cared for by the sister and mom.  Before that, he was at a rest home. He was discharged on May 26th.  PAST MEDICAL HISTORY: Significant for hypertension, diabetes mellitus type 2, history of hypothyroidism, obstructive sleep apnea, history of left foot osteomyelitis in December of last year and the patient was seen by Podiatry at that time, and was [facility] of left foot osteomyelitis. The patient says that he did fine for some time and he could not tell me what all he had there.  Apparently, he did not have a BKA or amputation there and the patient also denies any recent antibiotic use. He also has bipolar disorder.   ALLERGIES: No known allergies.   SOCIAL HISTORY: No smoking. No drinking. No drugs. Lives with mom and sister and has limited mobility and has bedside commode.   FAMILY HISTORY: Significant for mother had CHF. Father had a CVA and he also had a prostate cancer.   MEDICATIONS:   1.   Percocet 5/325  every 8 hours as needed. 2.  Neurontin 300 mg 4 times daily.  3.  Hydrochlorothiazide with losartan 25/100, one tablet daily. 4.  Levothyroxine 50 mcg p.o. daily. 5.  Loratadine 10 mg p.o. daily. 6.  Metformin 1 gram p.o. b.i.d.  7.  Montelukast 10 mg p.o. daily. 8.  (Dictation Anomaly)Potassium 20 mEq p.o. t.i.d.  9.  Torsemide 20 mg p.o. b.i.d.  10.  Geodon 80 mg p.o. b.i.d.  11.  Verapamil 200 mg extended release 2 capsules once a day.  12.  Ambien 10 mg p.o. daily.   REVIEW OF SYSTEMS:  CONSTITUTIONAL: Has no fever. No fatigue. No loss of appetite.  EYES: No blurred vision.  ENT: No tinnitus. No epistaxis. No difficulty swallowing.  RESPIRATORY: Denies any cough or wheezing.  CARDIOVASCULAR: No chest pain. No palpitations.  GASTROINTESTINAL: No nausea. No vomiting. No abdominal pain.  GENITOURINARY: No dysuria.  ENDOCRINE: No polyuria or nocturia. Has history of diabetes.  MUSCULOSKELETAL: Redness is present in the right foot and also there is an ulcer in the bottom of the left foot.  SKIN: The patient has no ulcers on the right foot, has some edema and erythema of the right foot. On the  left lower leg, the patient has an ulcer on the bottom of the left foot and there is no purulent drainage at this place and also no foul odor at that  place. NEUROLOGIC: Alert, awake and oriented.  Cranial nerves II-XII intact.  Power 5/5 in upper and lower extremities.  Sensory  intact. DTR  2+ bilaterally.   LABORATORY DATA: WBC 10.3, hemoglobin 10.2, hematocrit 30.5, platelets 352,000.  Electrolytes: Sodium 141, potassium 3.7, chloride 102, bicarbonate 29, BUN 13, creatinine 1, glucose 97. LFTs: Alkaline phosphatase 127.  Others are normal.   RADIOLOGY DATA:Xray ankle   chronic changes consistent with Charcot joints primarily involving the midfoot. There has been decreased soft tissue swelling. The hypertrophic bony spurring and remodeling involving the midfoot appear to be is  slightly more corticated. There is no evidence of or appreciable subcutaneous emphysema.The patient's  lower extremity DVT study on the left leg showed no DVT.   ASSESSMENT AND PLAN:  The patient is a 54 year old male with diabetes mellitus type 2, moderate obesity, hypothyroidism, hyperlipidemia, brought in because of left foot infection.  1.  The patient likely has diabetic foot ulcer on the left foot. The patient will get wound cultures.  Admit him to Medical Surgery Unit; start him on vancomycin and Zosyn. Obtain podiatric consult. The  patient will probably get a debridement and wound care referral and see how he does. 2.  Diabetes mellitus type 2. Continue sliding scale with coverage and also metformin.  3.  Hypertension. Continue hydrochlorothiazide with losartan and also torsemide.  4.  Obstructive sleep apnea with obesity. Continue CPAP machine at night.  5.  Hypothyroidism. Continue Synthroid.  6.  History of neuropathy.  Continue Neurontin 300 mg p.o. q.i.d.  7.  History of bipolar disorder. The patient is on Geodon 80 mg p.o. b.i.d.   TIME SPENT: 60 minutes on history and physical.   ____________________________ Katha Hamming, MD sk:ds D: 04/25/2014 15:12:32 ET T: 04/25/2014 16:17:45 ET JOB#: [id number]  cc: Katha Hamming, MD, <Dictator> Katha Hamming MD ELECTRONICALLY SIGNED 05/01/2014 21:58

## 2015-02-07 NOTE — H&P (Signed)
PATIENT NAME:  Adrian [name], Adrian [name] MR#:  [id number] DATE OF BIRTH:  [birth year]  DATE OF ADMISSION:  10/04/2013  PRIMARY CARE PHYSICIAN:  [facility] [employer].  HISTORY OF PRESENT ILLNESS:  The patient is a 54 year old Caucasian male with past medical history significant for multiple medical problems including diabetes, left leg cellulitis with numerous admissions for the same presents back to the hospital with complaints of weakness. Apparently the patient was able to walk with a walker up until approximately one week ago and then he became so weak that he is bedbound now. On arrival to the Emergency Room, he was noted to have a left foot ulcer with foul drainage. Today, he said that he was not able to get up off the couch and he has been noticing increasing swelling in the leg and the foot. He is afebrile; however, he was noted to be tachycardic here in the hospital, also in acute renal failure and he had a high leukocytosis. The hospitalist services were contacted for admission.   PAST MEDICAL HISTORY:  Significant for:  1.  Bipolar affective disorder. 2.  A history of hypothyroidism.  3.  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 4.  A history of COPD. 5.  A history of admission for left leg cellulitis numerous times including August and September in 2014, June 2014, February 2014. He also had abdominal wall cellulitis with MRSA in February of 2014 and he had facial abscess in the submandibular area in January 2014.   6.  A history of acute renal failure. 7.  Cholelithiasis. 8.  Morbid obesity.  9.  Obstructive sleep apnea on CPAP at home, but not on oxygen. 10.  Hypertension.   MEDICATIONS:  According to medical records, the patient is on: 1.  Acetaminophen and hydrocodone 325/5 mg one pill every 8 hours as needed.  2.  Gabapentin 300 mg 4 times daily. 3.  Hydrochlorothiazide/losartan 35/100 one tablet once daily.  4.  Levothyroxine 50 mcg p.o. daily.  5.   Loratadine 10 mg p.o. daily.  6.  Metformin 1 gram twice daily.  7.  Montelukast 10 mg p.o. daily.  8.  Potassium chloride 20 mEq 3 times daily. 9.  Torsemide 10 mg p.o. twice daily.  9.  Verapamil 200 mg extended release 2 capsules once a day. 10.  Ziprasidone 80 mg twice daily.  11.  Zolpidem 10 mg p.o. once daily at bedtime.   SOCIAL HISTORY:  The patient does not smoke, does not drink and no drugs. Lives with his mother as well as his sister who is a highly functioning schizophrenic.   FAMILY HISTORY:  Significant for congestive heart failure in mother. Father had history of CVA. He is deceased.   REVIEW OF SYSTEMS:  CONSTITUTIONAL:  Positive for weight gain, approximately 80 pounds according to his estimation and sinus congestion also constipation. He feels that his legs were further apart and he has right leg pain of unclear etiology, also left foot pain with pus for a long period of time now. His blood glucose levels are good. He tells me that they have been ranging from 96 to 130s. Otherwise he denies any fevers, chills. Admits of fatigue and weakness and admits of some pain, admits of weight gain but no weight loss.  EYES:  No blurred vision, double vision, glaucoma or cataracts.  ENT:  Denies any tinnitus, allergies, epistaxis, sinus pain, dentures, difficulty swallowing. RESPIRATORY:  Denies any wheezes, asthma, COPD or cough.  CARDIOVASCULAR:  Denies  any chest pain,  orthopnea, edema, arrhythmias, palpitations. GASTROINTESTINAL:  Denies any nausea, vomiting, diarrhea, abdominal pain.  GENITOURINARY:  Denies dysuria, hematuria, frequency, incontinence. ENDOCRINE:  Denies polydipsia, nocturia or thyroid problems, heat or cold intolerance or thirst.  HEMATOLOGIC:  Denies anemia, easy bruising, bleeding or swollen glands.  SKIN:  Denies any acne, rashes, change in moles except for that mentioned above.  MUSCULOSKELETAL:  Denies arthritis, cramps, swelling. NEUROLOGICAL:  No numbness,  epilepsy or tremor.  PSYCHIATRIC:  Denies anxiety, insomnia.   PHYSICAL EXAMINATION: VITAL SIGNS:  On arrival to the hospital, temperature was 98.9, pulse was 115, respirations were 20, blood pressure 140/87. Saturation was 93% on room air.  GENERAL:  This is morbidly obese, Caucasian male in no significant distress, sitting on the stretcher.  HEENT:  Pupils equal and reactive to light. Extraocular muscles intact. No icterus or conjunctivitis. He has normal hearing. No pharyngeal erythema. Mucosa is moist.  NECK:  No masses. Supple, nontender. Thyroid is not enlarged. No adenopathy. No JVD or carotid bruits bilaterally. Full range of motion.   LUNGS:  Clear to auscultation in all fields. Symmetrical rise and fall. Symmetrical expansion. No egophany or tactile fremitus was noted. CARDIOVASCULAR:  Regular rhythm and rate. No murmurs, gallops, thrills, heaves or rubs. Cardiac palpation was within normal limits. Pulses were equal at carotids. Not able to assess for femoral or pedal pulses since significant peripheral edema was noted as well as abdominal wall edema.  GENITOURINARY:  I am not able to find his penis. It is buried in his pannus.  MUSCULOSKELETAL:  Not able to assess his gait or sensation. He is bedbound at present. He is severely obese. He has some onychomycosis. EXTREMITIES:  Moves; however, the patient does have significant weakness in his lower extremities as well as lower extremities are really apart on the bed due to significant pannus between his legs. The patient does have left plantar lateral surface wound, which is approximately 1.5 inches in diameter, very deep of approximately 5 to 7 mm by visual estimation, draining foul-smelling drainage. Otherwise the skin is scaly with redness and kind of the infection was noted in the folds between the abdominal wall as well as thigh of the patient. I am not able to palpate well his peripheral pulses though. LYMPHATICS:  No cervical lymph  enlargement.   NEUROLOGICAL:  Cranial nerves grossly intact. Sensory is intact. No dysarthria or aphasia. The patient is alert, oriented to person and place, cooperative. Memory is good. PSYCHIATRIC:  No significant confusion, agitation or depression was noted  LABORATORIES:  BMP showed glucose 120. The patient's natriuretic peptide was 964, BUN and creatinine were 28 and 2.15. Sodium 127, potassium 5.0, bicarbonate level low at 19. Liver enzymes revealed albumin level of 3.0, alkaline phosphatase was 131, and AST was elevated to 154. Troponin is 0.04. The patient's white blood cell count is elevated to 27.6. Hemoglobin was 10.6 and platelet count was 533. Urinalysis is not obtained yet since they were not able to find his penis.   Chest x-ray, portable single view on 10/04/2013, revealed mild, stable hyperinflation suggesting COPD, mild enlargement of cardiac silhouette with prominence of pulmonary vascularity suggesting low-grade CHF which appeared to be compensated. No evidence of pneumonia or pleural effusion. A pelvis AP only x-ray 10/04/2013, due to bilateral hip pain, revealed limited exam due to inability to move the patient. Degenerative changes present in lumbar spine as well as both hips, a large amount of stool in the colon suggesting constipation.  According to nursing staff here in the Emergency Room, the patient does have some small buttock area bed sore.   ASSESSMENT AND PLAN:  1.  Leukocytosis, tachycardia and renal failure. We will admit the patient to the medical floor, get blood as well as wound cultures as well as urine cultures. We will start vancomycin and Zosyn and adjust antibiotics according to culture results.  2.  Left foot infected ulcer. We will get podiatry evaluation, as well as wound care.  3.  Acute renal failure, questionable obstruction as the patient's penis was not seen, buried underneath the pannus. We will get a Foley catheter placed and we will get urinalysis as  well as cultures. 4.  Leukocytosis. We will follow with therapy.  5.  Diabetes mellitus. We will hold metformin. We will continue the patient on sliding scale insulin. 6.  Hypertension. We will have to hold the hydrochlorothiazide/losartan as well as torsemide due to concerns of dehydration and renal failure related to those medications.  7.  Hypothyroidism. Continue Synthroid and check TSH.  8.  Constipation. Give enema and continue patient on stool softeners.  9.  Elevated transaminases seem to be improving since the prior studies, possibly fat liver infiltration.  10.  Morbid obesity. This was discussed with patient, however, apparently he did not consider weight loss surgery in the past.  11.  Obstructive sleep apnea. We will continue CPAP. The patient is not on oxygen at home. The patient would benefit to have nocturnal oximetry study and on CPAP.  12.  Hypertension. The patient is not hypertensive at this time. We will continue him on his usual doses of verapamil. However, we will not initiate ACE inhibitor or hydrochlorothiazide or torsemide at this point.   TIME SPENT:  One hour.  ____________________________ Katharina Caper, MD rv:jm D: 10/04/2013 12:59:00 ET T: 10/04/2013 13:43:12 ET JOB#: [id number]  cc: [facility] Katharina Caper, MD, <Dictator>   Tamim Skog MD ELECTRONICALLY SIGNED 10/25/2013 16:38

## 2015-02-07 NOTE — Consult Note (Signed)
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   Patient was seen by consultant.   Consult note dictated.   Comments: No overt signs of infection.  Pt has very pronounced bone prominece under lateral left foot due to his charcot.  Might get better resolutionm of ulcer if somke of thid bone was resected.  Electronic Signatures: Perry Mount (MD)  (Signed 10-Jul-15 18:05)  Authored: Brief Consult Note   Last Updated: 10-Jul-15 18:05 by Perry Mount (MD)

## 2015-02-07 NOTE — Consult Note (Signed)
PATIENT NAME:  Adrian [name], Adrian [name] MR#:  [id number] DATE OF BIRTH:  June 13, 1961  DATE OF CONSULTATION:  04/25/2014  CONSULTING PHYSICIAN: Rhona Raider Kierrah Kilbride, DPM.   REASON FOR CONSULTATION: Diabetic ulcer, left foot.   HISTORY OF PRESENT ILLNESS: The patient apparently had an ulcer here for a long time. He has had the diagnoses of Charcot foot on his left foot. He has been hospitalized here before, had an ulceration, was seen at [facility]. He states that all they told him at [facility] was he needed to have leg cut off.  He went to [facility] for 2-1/2 months where they treated his wound and it got better, but it has continued to redevelop and break open again.   PAST MEDICAL HISTORY: Diabetes, morbid obesity, sleep apnea, bipolar disease, high cholesterol, asthma, hypertension.  CURRENT MEDICATIONS AT HOME:  Levothyroxine 50 mcg daily, hydrochlorothiazide/losartan 25/100 daily, loratadine 10 mg daily, gabapentin, 300 mg 4 times a day, torsemide 20 mg twice a day, metformin 1000 mg b.i.d., verapamil 200 mg 2 capsules once  a day, ziprasidone 80 mg 1 tablet twice a day, montelukast 10 mg daily, potassium chloride 20 mEq 3 times a day, Zolpidem 10 mg orally once a day,  hydrocodone/acetaminophen every 8 hours as needed for pain.   ALLERGIES: NKDA.  PHYSICAL EXAMINATION: VITAL SIGNS: Current temperature is 98.1, pulse 94, respirations 19, blood pressure 144/82, pulse oximetry 96%. LOWER EXTREMITY EXAM VASCULATURE:  The pulses are palpable. He does have severe lymphedema and venous stasis edema bilaterally with venous stasis dermatitis to both lower extremities.  DERMATOLOGICALLY:  The patient has an ulceration on the submetatarsal 5th tuberosity area of the left foot that is about 1.8 cm in diameter. It looks fairly superficial at this point. There was not too much depth and no surrounding cellulitis or infection. No evidence of drainage from the wound. No progressing redness or erythema.  ORTHOPEDICS:  The  patient has, what appears to be, Charcot-type foot. The lateral column was extremely prominent with palpation right in association with the ulceration.   RADIOLOGY DATA:  X-rays reviewed were taken earlier today here at the hospital show a Charcot arthropathy along the lateral column, 4th  and 5th  metatarsal cuboid areas with a severe plantar presentation of the 5th metatarsal tuberosity and the cuboid. There are degenerative changes and evidence of old injury to the region.   CLINICAL IMPRESSION: Charcot arthropathy with plantar hyperostosis and bony prominences resulting in a neuropathic ulceration underneath the 5th  metatarsal tuberosity.   TREATMENT PLAN: Micah Flesher over some of the findings with the patient. I am going to pad and dress the area today, put calcium alginate and wound dressing on it. At some point, I think it would be worthwhile to resect the bony proliferation on the plantar lateral aspect and see if that would eliminate the ulceration on the area. He will, of course, need supportive diabetic shoes and prescription insoles to help accommodate this appropriately to maintain better support and reduce the likelihood of recurrence in the future. Right now, we will kind of manage the problem, but we may have to consider some type of surgery at some point in the future. I do not know if it needs to be done during this hospital stay or on an outpatient basis, but will need to consider of morbid obesity of the patient, the inability for him to really get around or  move very much. I will follow him over the weekend and see how well he  does with this.   ____________________________ Rhona RaiderMatthew G. Madyson Lukach, DPM mgt:ds D: 04/25/2014 18:02:26 ET T: 04/25/2014 22:13:04 ET JOB#: [id number]  cc: Rhona RaiderMatthew G. Hawken Bielby, DPM, <Dictator> Epimenio SarinMATTHEW G Marleny Faller MD ELECTRONICALLY SIGNED 05/13/2014 13:55

## 2015-02-07 NOTE — Discharge Summary (Signed)
PATIENT NAME:  Adrian [name], Ragnar B MR#:  [id number] DATE OF BIRTH:  1961-08-11  DATE OF ADMISSION:  04/26/2014 DATE OF DISCHARGE:  04/26/2014  PRIMARY CARE PHYSICIAN:  Dennison MascotLemont Morrisey, MD.  DISCHARGE DIAGNOSES:   1.  Left diabetic foot ulcer.  2.  Hypertension, diabetes, obstructive sleep apnea, obesity.   CONDITION: Stable.   CODE STATUS: Full code.   HOME MEDICATIONS: Please refer to the medication reconciliation list.   CONSULTATIONS:  Rhona RaiderMatthew G. Troxler, DPM, podiatrist.  DIET: Low-sodium, low-fat, low-cholesterol, ADA diet.   ACTIVITY: As tolerated.   FOLLOWUP CARE: Follow with PCP within 1-2 weeks. [facility] scheduled for left foot ulcer.   REASON FOR ADMISSION: Foot ulcer.   HOSPITAL COURSE: The patient is a 54 year old Caucasian male with a history of hypertension, diabetes, obesity, OSA,  presented to the ED for foot ulcer which recently got worse,  associated with ambulatory difficulties.  For detailed history and physical examination, please refer to the admission note dictated by Dr. Luberta MutterKonidena.  Laboratory data on admission date was unremarkable except hemoglobin 10.2.  Foot x-ray on the left side showed Charcot changes consistent with Charcot joint.   After admission, the patient has been treated with vancomycin and Zosyn. Dr. Orland Jarredroxler, podiatrist, evaluated the patient, has suggested no evidence of infection. He gave the patient a PO shoe and accommodate the area to try to afford pressure relief.  He suggested the patient to follow up [facility] physician for surgery. The patient has no complaints except left foot pain. The patient is clinically stable.  He will be discharged to home today. I discussed the patient's discharge plan with the patient, nurse, case manager.   TIME SPENT: About 36 minutes.    ____________________________ Shaune PollackQing Jace Fermin, MD qc:ds D: 04/26/2014 15:43:33 ET T: 04/26/2014 22:04:41 ET JOB#: [id number]  cc: Shaune PollackQing Kennie Snedden, MD,  <Dictator> Shaune PollackQING Adal Sereno MD ELECTRONICALLY SIGNED 04/27/2014 14:38

## 2015-06-10 ENCOUNTER — Encounter: Payer: Self-pay | Admitting: Emergency Medicine

## 2015-06-10 ENCOUNTER — Emergency Department
Admission: EM | Admit: 2015-06-10 | Discharge: 2015-06-10 | Disposition: A | Discharge status: Home / Self Care | Payer: Medicare Other | Attending: Emergency Medicine | Admitting: Emergency Medicine

## 2015-06-10 DIAGNOSIS — B372 Candidiasis of skin and nail: Secondary | ICD-10-CM | POA: Diagnosis not present

## 2015-06-10 DIAGNOSIS — I10 Essential (primary) hypertension: Secondary | ICD-10-CM | POA: Insufficient documentation

## 2015-06-10 DIAGNOSIS — X58XXXA Exposure to other specified factors, initial encounter: Secondary | ICD-10-CM | POA: Diagnosis not present

## 2015-06-10 DIAGNOSIS — Y9289 Other specified places as the place of occurrence of the external cause: Secondary | ICD-10-CM | POA: Insufficient documentation

## 2015-06-10 DIAGNOSIS — Z79899 Other long term (current) drug therapy: Secondary | ICD-10-CM | POA: Diagnosis not present

## 2015-06-10 DIAGNOSIS — Z7951 Long term (current) use of inhaled steroids: Secondary | ICD-10-CM | POA: Insufficient documentation

## 2015-06-10 DIAGNOSIS — S91302A Unspecified open wound, left foot, initial encounter: Secondary | ICD-10-CM | POA: Diagnosis present

## 2015-06-10 DIAGNOSIS — Y9389 Activity, other specified: Secondary | ICD-10-CM | POA: Diagnosis not present

## 2015-06-10 DIAGNOSIS — S31119A Laceration without foreign body of abdominal wall, unspecified quadrant without penetration into peritoneal cavity, initial encounter: Secondary | ICD-10-CM | POA: Diagnosis not present

## 2015-06-10 DIAGNOSIS — Y998 Other external cause status: Secondary | ICD-10-CM | POA: Diagnosis not present

## 2015-06-10 DIAGNOSIS — Z792 Long term (current) use of antibiotics: Secondary | ICD-10-CM | POA: Insufficient documentation

## 2015-06-10 DIAGNOSIS — Z9889 Other specified postprocedural states: Secondary | ICD-10-CM | POA: Diagnosis not present

## 2015-06-10 DIAGNOSIS — E1140 Type 2 diabetes mellitus with diabetic neuropathy, unspecified: Secondary | ICD-10-CM | POA: Insufficient documentation

## 2015-06-10 MED ORDER — NYSTATIN 100000 UNIT/GM EX POWD: CUTANEOUS | Status: DC

## 2015-06-10 MED ORDER — NYSTATIN 100000 UNIT/GM EX POWD
Freq: Once | CUTANEOUS | Status: AC
  Administered 2015-06-10: 17:00:00 via TOPICAL
  Filled 2015-06-10: qty 15

## 2015-06-10 MED ORDER — CEPHALEXIN 500 MG PO CAPS
1000.0000 mg | ORAL_CAPSULE | Freq: Once | ORAL | Status: AC
  Administered 2015-06-10: 1000 mg via ORAL
  Filled 2015-06-10: qty 2

## 2015-06-10 MED ORDER — CEPHALEXIN 500 MG PO CAPS: 500.0000 mg | ORAL_CAPSULE | Freq: Three times a day (TID) | ORAL | Status: DC

## 2015-06-10 NOTE — Discharge Instructions (Signed)
The wound on the abdomen appears subacute. There is no significant redness around the wound. There is granulation tissue present. This wound should heal with basic wound care. The wound on the left foot is chronic. There is no significant redness around this wound area there is no significant drainage or discharge from it. Follow-up with her regular doctor for ongoing basic wound care. The acute issue noted today is the yeast infection under the abdominal pannus. Wash this area 2-3 times a day, let it dry, and then applying nystatin. Follow with her regular doctor ([facility]).  Consider follow-up with the wound care clinic. Return to the emergency department if there are urgent concerns.   Cutaneous Candidiasis Cutaneous candidiasis is a condition in which there is an overgrowth of yeast (candida) on the skin. Yeast normally live on the skin, but in small enough numbers not to cause any symptoms. In certain cases, increased growth of the yeast may cause an actual yeast infection. This kind of infection usually occurs in areas of the skin that are constantly warm and moist, such as the armpits or the groin. Yeast is the most common cause of diaper rash in babies and in people who cannot control their bowel movements (incontinence). CAUSES  The fungus that most often causes cutaneous candidiasis is Candida albicans. Conditions that can increase the risk of getting a yeast infection of the skin include:  Obesity.  Pregnancy.  Diabetes.  Taking antibiotic medicine.  Taking birth control pills.  Taking steroid medicines.  Thyroid disease.  An iron or zinc deficiency.  Problems with the immune system. SYMPTOMS   Red, swollen area of the skin.  Bumps on the skin.  Itchiness. DIAGNOSIS  The diagnosis of cutaneous candidiasis is usually based on its appearance. Light scrapings of the skin may also be taken and viewed under a microscope to identify the presence of  yeast. TREATMENT  Antifungal creams may be applied to the infected skin. In severe cases, oral medicines may be needed.  HOME CARE INSTRUCTIONS   Keep your skin clean and dry.  Maintain a healthy weight.  If you have diabetes, keep your blood sugar under control. SEEK IMMEDIATE MEDICAL CARE IF:  Your rash continues to spread despite treatment.  You have a fever, chills, or abdominal pain. Document Released: 06/21/2011 Document Revised: 12/26/2011 Document Reviewed: 06/21/2011 [facility] Patient Information 2015 [place], [place]. This information is not intended to replace advice given to you by your health care provider. Make sure you discuss any questions you have with your health care provider.

## 2015-06-10 NOTE — ED Provider Notes (Signed)
[facility] Emergency Department Provider Note  ____________________________________________  Time seen:  2:40 PM  I have reviewed the triage vital signs and the nursing notes.   HISTORY  Chief Complaint Skin Ulcer  abdomen Nonhealing wound, left foot    HPI Adrian [name]. is a 54 y.o. male with a history of diabetes as well as obesity and bipolar disorder. He lives at home with his 34 year old mother, but his 66 year old mother is now in the hospital. He appears to have some limited function.  The patient has come to the hospital by ambulance because of a wound on his lower abdomen. He tells me that he gets these sort of "blood blisters" on occasion. He showed them to his former primary care doctor, who never did much about them, according to the patient's report.  In addition to the wound on his belly, there are concerns about a nonhealing wound on his left foot. The patient had surgery on the foot in December 2015. The wound overall was doing well and got smaller but then enlarged.  The patient is accompanied by a person, Adrian [name], who is married to the orthopedist here at [facility]. She is from the patient's church who is helping the family.  She has expressed the concerns that the patient may need additional wound care for these 2 wounds. Apparently, the abdominal wound has had some drainage.  The patient has a fair amount of support for medical concerns at his home. Doctors making house calls services primary physicians. He has a wound care nurse coming out 3 times a week. They're focused on the wound on the left foot and they did a dressing change today.   Now, with the patient's mother in the hospital, there is concerned that he will not be of the care for himself at home. Social services has been involved. Apparently they have arranged to have someone come and, at the patient's expense, and clean and do some simple cooking for him. The  patient is also learning to cut for himself also.   Past Medical History  Diagnosis Date  . Asthma   . Hypertension   . Hyperlipemia   . Diabetes mellitus without complication   . Hypothyroidism   . Bipolar disorder   . Sleep apnea, obstructive   . Depression     Patient Active Problem List   Diagnosis Date Noted  . Allergic rhinitis, cause unspecified 02/24/2014  . Unspecified constipation 02/24/2014  . Essential hypertension, benign 02/24/2014  . UTI (lower urinary tract infection) 01/16/2014  . Altered mental status 01/16/2014  . Diabetic osteomyelitis 01/03/2014  . Diabetic neuropathy 01/03/2014  . Bipolar disorder, unspecified 01/03/2014  . Unspecified hypothyroidism 01/03/2014  . Obstructive sleep apnea 01/03/2014  . Asthma, chronic 01/03/2014  . Seborrheic dermatitis of scalp 01/03/2014  . Morbid obesity 01/03/2014    Past Surgical History  Procedure Laterality Date  . Tonsillectomy    . Bone removed      bone removed in foot due to infection     Current Outpatient Rx  Name  Route  Sig  Dispense  Refill  . acetaminophen (TYLENOL) 325 MG tablet   Oral   Take 650 mg by mouth every 6 (six) hours as needed for mild pain.         Marland Kitchen alum & mag hydroxide-simeth (MAALOX/MYLANTA) 200-200-20 MG/5ML suspension   Oral   Take 30 mLs by mouth every 4 (four) hours as needed for indigestion or heartburn.         Marland Kitchen  cefUROXime (CEFTIN) 500 MG tablet   Oral   Take 1 tablet (500 mg total) by mouth 2 (two) times daily with a meal.   14 tablet   0   . cephALEXin (KEFLEX) 500 MG capsule   Oral   Take 1 capsule (500 mg total) by mouth 3 (three) times daily.   21 capsule   0   . clonazePAM (KLONOPIN) 0.5 MG tablet      Take one tablet by mouth twice daily   60 tablet   5   . enoxaparin (LOVENOX) 40 MG/0.4ML injection   Subcutaneous   Inject 40 mg into the skin daily.         . Fluticasone-Salmeterol (ADVAIR) 250-50 MCG/DOSE AEPB   Inhalation   Inhale 1  puff into the lungs 2 (two) times daily.         Marland Kitchen gabapentin (NEURONTIN) 300 MG capsule   Oral   Take 300 mg by mouth 4 (four) times daily.         Marland Kitchen guaifenesin (ROBITUSSIN) 100 MG/5ML syrup   Oral   Take 200 mg by mouth 3 (three) times daily as needed for cough.         . levothyroxine (SYNTHROID, LEVOTHROID) 75 MCG tablet   Oral   Take 75 mcg by mouth daily before breakfast.         . loratadine (CLARITIN) 10 MG tablet   Oral   Take 10 mg by mouth daily.         . magnesium hydroxide (MILK OF MAGNESIA) 400 MG/5ML suspension   Oral   Take 30 mLs by mouth daily.         . metFORMIN (GLUCOPHAGE) 1000 MG tablet   Oral   Take 1,000 mg by mouth 2 (two) times daily with a meal.         . metoprolol tartrate (LOPRESSOR) 25 MG tablet   Oral   Take 25 mg by mouth 2 (two) times daily.         . montelukast (SINGULAIR) 10 MG tablet   Oral   Take 10 mg by mouth daily.         Marland Kitchen nystatin (MYCOSTATIN/NYSTOP) 100000 UNIT/GM POWD      3 times a day - Apply to the area under the left pannus after washing with soap and water and allowing the area to dry.   1 Bottle   0   . oxcarbazepine (TRILEPTAL) 600 MG tablet   Oral   Take 600 mg by mouth 2 (two) times daily.         . sennosides-docusate sodium (SENOKOT-S) 8.6-50 MG tablet   Oral   Take 1 tablet by mouth 2 (two) times daily.         . simethicone (MYLICON) 125 MG chewable tablet   Oral   Chew 125 mg by mouth every 6 (six) hours as needed for flatulence.         . tiotropium (SPIRIVA) 18 MCG inhalation capsule   Inhalation   Place 18 mcg into inhaler and inhale daily.         Marland Kitchen torsemide (DEMADEX) 20 MG tablet   Oral   Take 20 mg by mouth daily.         . verapamil (CALAN-SR) 120 MG CR tablet   Oral   Take 120 mg by mouth at bedtime.         . ziprasidone (GEODON) 60 MG capsule   Oral  Take 60 mg by mouth 2 (two) times daily with a meal.         . zolpidem (AMBIEN) 10 MG  tablet      Take one tablet by mouth every night at bedtime as needed for rest   10 tablet   0     Allergies Review of patient's allergies indicates no known allergies.  Family History  Problem Relation Age of Onset  . Heart failure Mother   . Stroke Father     Social History Social History  Substance Use Topics  . Smoking status: Never Smoker   . Smokeless tobacco: Not on file  . Alcohol Use: No    Review of Systems  Constitutional: Negative for fever. ENT: Negative for sore throat. Cardiovascular: Negative for chest pain. Respiratory: Negative for shortness of breath. Gastrointestinal: Negative for abdominal pain, vomiting and diarrhea. Genitourinary: Negative for dysuria. Musculoskeletal: No myalgias or injuries. Skin: Nonhealing wound on the left foot, new wound on the lower abdomen. Neurological: Negative for headaches Psychological: Patient is alert and communicative. He speaks in a very loud voice with a slightly odd affect. He has an intact thought process and seems to be making reasonable decisions.  10-point ROS otherwise negative.  ____________________________________________   PHYSICAL EXAM:  VITAL SIGNS: ED Triage Vitals  Enc Vitals Group     BP --      Pulse --      Resp --      Temp --      Temp src --      SpO2 --      Weight --      Height --      Head Cir --      Peak Flow --      Pain Score --      Pain Loc --      Pain Edu? --      Excl. in GC? --     Constitutional: Alert, communicative. Large body habitus. No acute distress. ENT   Head: Normocephalic and atraumatic.   Nose: No congestion/rhinnorhea. Cardiovascular: Normal rate at 96, regular rhythm, no murmur noted Respiratory:  Normal respiratory effort, no tachypnea.    Breath sounds are clear and equal bilaterally.  Gastrointestinal: Large abdomen with notable pannus. Soft and nontender. No distention.  Underneath the pannus, the patient has a large amount of  moisture buildup with erythema and some whitish discharge. This is consistent with a yeast infection. On the abdominal wall, just under the large pannus, there is a wound that is oriented vertically and is approximately 4 inches long. It appears subacute. There is no bleeding. There is some granulation and there is no significant erythema around this wound. Musculoskeletal: Limited function due to obesity. His beats are somewhat outwardly turned. He has no gross deformity. Neurologic:  Normal speech and language. No gross focal neurologic deficits are appreciated.  Skin:  3 significant findings -  1) as noted above under the GI evaluation, the patient has a cutaneous use infection underneath his pannus. 2) patient has a subacute laceration on his abdominal wall. This is healing adequately without significant erythema. There is granulation present. 3) the patient's left foot has a nonhealing ulceration in the lateral midfoot edge. There is a slight foul smell coming from the wound as I undress it, but there is no discharge and there is no surrounding erythema. Psychiatric: Alert and communicative periods in a loud voice. Odd affect..  ____________________________________________  INITIAL IMPRESSION / ASSESSMENT AND PLAN / ED COURSE  Pertinent labs & imaging results that were available during my care of the patient were reviewed by me and considered in my medical decision making (see chart for details).   While there are social concerns for the ability of this patient to function at home, he is alert and communicative and there is no indication for hospitalization for wound care. The 3 skin issues are all either chronic or subacute.  I will speak with care management to see if we can increase the frequency of his wound care nurse from 3 times a week to 5 times a week. In addition of this, social services is apparently arranging for additional support in the house as well and he has the support  from members of his church.  The patient does have limited mobility due to his obesity. He needs support and assistance to go home area and he is requesting ambulance transport, and I think, given his limited function, this is reasonable.  ----------------------------------------- 5:04 PM on 06/10/2015 -----------------------------------------  I spoke with case management. They were able to contact the home health group that helps care for Mr. Dollar. I spoke with one of their representatives to establish more frequent visits for wound care. I found out that the patient has 3 visits a week for wound care but also 2 other visits a week for general home health. The representative was also helpful with additional history and the concerns of the primary physician   In addition to making arrangements for additional home health, I spoke with a social services representative who came to the emergency department. I was impressed with their involvement and the extent of support this patient is receiving. In addition to their support she also reassures me that members in the church are frequently at the house.  I did find out that the patient's sister, who also lives in the house, is currently in physical rehabilitation due to a fracture of month or 2 ago.  At this time, I will discharge him home with a prescription for nystatin for the yeast infection under the pannus and for Keflex just due to general concerns for wound infection and subacute abdominal wound and the chronic left foot wound.  ____________________________________________   FINAL CLINICAL IMPRESSION(S) / ED DIAGNOSES  Final diagnoses:  Wound, open, foot, left, initial encounter  Laceration of abdominal wall, initial encounter  Candidiasis of skin      Darien Ramus, MD 06/10/15 1725

## 2015-06-10 NOTE — ED Notes (Signed)
Pt states he has had these ulcers on his abd for about 9 years and no one has "done anything about it" got a call today telling him he needs to go to the ED and get it checked out.

## 2015-10-02 ENCOUNTER — Inpatient Hospital Stay
Admission: EM | Admit: 2015-10-02 | Discharge: 2015-10-04 | DRG: 871 | Disposition: A | Discharge status: Other Acute Inpatient Hospital | Payer: Medicare Other | Attending: Specialist | Admitting: Specialist

## 2015-10-02 ENCOUNTER — Emergency Department: Payer: Medicare Other

## 2015-10-02 DIAGNOSIS — Z823 Family history of stroke: Secondary | ICD-10-CM | POA: Diagnosis not present

## 2015-10-02 DIAGNOSIS — E039 Hypothyroidism, unspecified: Secondary | ICD-10-CM | POA: Diagnosis present

## 2015-10-02 DIAGNOSIS — J9690 Respiratory failure, unspecified, unspecified whether with hypoxia or hypercapnia: Secondary | ICD-10-CM | POA: Diagnosis present

## 2015-10-02 DIAGNOSIS — J449 Chronic obstructive pulmonary disease, unspecified: Secondary | ICD-10-CM | POA: Diagnosis present

## 2015-10-02 DIAGNOSIS — Z6841 Body Mass Index (BMI) 40.0 and over, adult: Secondary | ICD-10-CM

## 2015-10-02 DIAGNOSIS — A408 Other streptococcal sepsis: Secondary | ICD-10-CM | POA: Diagnosis not present

## 2015-10-02 DIAGNOSIS — J9621 Acute and chronic respiratory failure with hypoxia: Secondary | ICD-10-CM | POA: Diagnosis present

## 2015-10-02 DIAGNOSIS — Z79899 Other long term (current) drug therapy: Secondary | ICD-10-CM

## 2015-10-02 DIAGNOSIS — J9622 Acute and chronic respiratory failure with hypercapnia: Secondary | ICD-10-CM | POA: Diagnosis present

## 2015-10-02 DIAGNOSIS — G4733 Obstructive sleep apnea (adult) (pediatric): Secondary | ICD-10-CM | POA: Diagnosis present

## 2015-10-02 DIAGNOSIS — E782 Mixed hyperlipidemia: Secondary | ICD-10-CM | POA: Diagnosis present

## 2015-10-02 DIAGNOSIS — L03116 Cellulitis of left lower limb: Secondary | ICD-10-CM | POA: Diagnosis present

## 2015-10-02 DIAGNOSIS — F3160 Bipolar disorder, current episode mixed, unspecified: Secondary | ICD-10-CM | POA: Diagnosis present

## 2015-10-02 DIAGNOSIS — L03311 Cellulitis of abdominal wall: Secondary | ICD-10-CM | POA: Diagnosis present

## 2015-10-02 DIAGNOSIS — N289 Disorder of kidney and ureter, unspecified: Secondary | ICD-10-CM | POA: Diagnosis present

## 2015-10-02 DIAGNOSIS — Z7951 Long term (current) use of inhaled steroids: Secondary | ICD-10-CM

## 2015-10-02 DIAGNOSIS — J45909 Unspecified asthma, uncomplicated: Secondary | ICD-10-CM | POA: Diagnosis present

## 2015-10-02 DIAGNOSIS — I248 Other forms of acute ischemic heart disease: Secondary | ICD-10-CM | POA: Diagnosis present

## 2015-10-02 DIAGNOSIS — A419 Sepsis, unspecified organism: Secondary | ICD-10-CM | POA: Diagnosis present

## 2015-10-02 DIAGNOSIS — I10 Essential (primary) hypertension: Secondary | ICD-10-CM | POA: Diagnosis present

## 2015-10-02 DIAGNOSIS — I5042 Chronic combined systolic (congestive) and diastolic (congestive) heart failure: Secondary | ICD-10-CM | POA: Diagnosis present

## 2015-10-02 DIAGNOSIS — E662 Morbid (severe) obesity with alveolar hypoventilation: Secondary | ICD-10-CM | POA: Diagnosis present

## 2015-10-02 DIAGNOSIS — D649 Anemia, unspecified: Secondary | ICD-10-CM | POA: Diagnosis present

## 2015-10-02 DIAGNOSIS — R509 Fever, unspecified: Secondary | ICD-10-CM

## 2015-10-02 DIAGNOSIS — R0602 Shortness of breath: Secondary | ICD-10-CM

## 2015-10-02 DIAGNOSIS — L03115 Cellulitis of right lower limb: Secondary | ICD-10-CM | POA: Diagnosis present

## 2015-10-02 DIAGNOSIS — Z9889 Other specified postprocedural states: Secondary | ICD-10-CM

## 2015-10-02 DIAGNOSIS — R0902 Hypoxemia: Secondary | ICD-10-CM

## 2015-10-02 DIAGNOSIS — E119 Type 2 diabetes mellitus without complications: Secondary | ICD-10-CM | POA: Diagnosis present

## 2015-10-02 DIAGNOSIS — Z8249 Family history of ischemic heart disease and other diseases of the circulatory system: Secondary | ICD-10-CM

## 2015-10-02 LAB — CREATININE, SERUM
Creatinine, Ser: 1.61 mg/dL — ABNORMAL HIGH (ref 0.61–1.24)
GFR calc Af Amer: 54 mL/min — ABNORMAL LOW (ref >60)
GFR calc non Af Amer: 47 mL/min — ABNORMAL LOW (ref >60)

## 2015-10-02 LAB — CBC
HCT: 30.7 % — ABNORMAL LOW (ref 40.0–52.0)
HEMATOCRIT: 25 % — AB (ref 40.0–52.0)
HEMOGLOBIN: 9.6 g/dL — AB (ref 13.0–18.0)
Hemoglobin: 8.2 g/dL — ABNORMAL LOW (ref 13.0–18.0)
MCH: 27.1 pg (ref 26.0–34.0)
MCH: 27.7 pg (ref 26.0–34.0)
MCHC: 31.4 g/dL — ABNORMAL LOW (ref 32.0–36.0)
MCHC: 32.8 g/dL (ref 32.0–36.0)
MCV: 86.4 fL (ref 80.0–100.0)
MCV: 84.6 fL (ref 80.0–100.0)
PLATELETS: 373 10*3/uL (ref 150–440)
Platelets: 266 10*3/uL (ref 150–440)
RBC: 3.56 MIL/uL — AB (ref 4.40–5.90)
RBC: 2.95 MIL/uL — ABNORMAL LOW (ref 4.40–5.90)
RDW: 17.5 % — ABNORMAL HIGH (ref 11.5–14.5)
RDW: 16.9 % — AB (ref 11.5–14.5)
WBC: 30.9 10*3/uL — AB (ref 3.8–10.6)
WBC: 23.9 10*3/uL — ABNORMAL HIGH (ref 3.8–10.6)

## 2015-10-02 LAB — COMPREHENSIVE METABOLIC PANEL
ALK PHOS: 83 U/L (ref 38–126)
ALT: 26 U/L (ref 17–63)
AST: 28 U/L (ref 15–41)
Albumin: 3.5 g/dL (ref 3.5–5.0)
Anion gap: 13 (ref 5–15)
BUN: 29 mg/dL — AB (ref 6–20)
CALCIUM: 8.8 mg/dL — AB (ref 8.9–10.3)
CHLORIDE: 97 mmol/L — AB (ref 101–111)
CO2: 26 mmol/L (ref 22–32)
CREATININE: 1.49 mg/dL — AB (ref 0.61–1.24)
GFR calc Af Amer: 60 mL/min — ABNORMAL LOW (ref >60)
GFR calc non Af Amer: 52 mL/min — ABNORMAL LOW (ref >60)
Glucose, Bld: 131 mg/dL — ABNORMAL HIGH (ref 65–99)
Potassium: 4.7 mmol/L (ref 3.5–5.1)
SODIUM: 136 mmol/L (ref 135–145)
Total Bilirubin: 0.7 mg/dL (ref 0.3–1.2)
Total Protein: 7.9 g/dL (ref 6.5–8.1)

## 2015-10-02 LAB — URINALYSIS COMPLETE WITH MICROSCOPIC (ARMC ONLY)
BILIRUBIN URINE: NEGATIVE
Glucose, UA: NEGATIVE mg/dL
HGB URINE DIPSTICK: NEGATIVE
KETONES UR: NEGATIVE mg/dL
LEUKOCYTES UA: NEGATIVE
NITRITE: NEGATIVE
PH: 8 (ref 5.0–8.0)
PROTEIN: 100 mg/dL — AB
SPECIFIC GRAVITY, URINE: 1.006 (ref 1.005–1.030)

## 2015-10-02 LAB — TROPONIN I
Troponin I: 0.16 ng/mL — ABNORMAL HIGH (ref <0.031)
Troponin I: 0.23 ng/mL — ABNORMAL HIGH (ref <0.031)

## 2015-10-02 LAB — GLUCOSE, CAPILLARY: GLUCOSE-CAPILLARY: 111 mg/dL — AB (ref 65–99)

## 2015-10-02 LAB — LACTIC ACID, PLASMA
LACTIC ACID, VENOUS: 3.4 mmol/L — AB (ref 0.5–2.0)
LACTIC ACID, VENOUS: 2.9 mmol/L — AB (ref 0.5–2.0)

## 2015-10-02 LAB — TSH: TSH: 4.484 u[IU]/mL (ref 0.350–4.500)

## 2015-10-02 MED ORDER — MAGNESIUM HYDROXIDE 400 MG/5ML PO SUSP
30.0000 mL | Freq: Every day | ORAL | Status: DC
  Administered 2015-10-03: 30 mL via ORAL
  Filled 2015-10-02: qty 30

## 2015-10-02 MED ORDER — DOCUSATE SODIUM 100 MG PO CAPS
100.0000 mg | ORAL_CAPSULE | Freq: Two times a day (BID) | ORAL | Status: DC
  Administered 2015-10-02 – 2015-10-04 (×4): 100 mg via ORAL
  Filled 2015-10-02 (×4): qty 1

## 2015-10-02 MED ORDER — ENOXAPARIN SODIUM 40 MG/0.4ML ~~LOC~~ SOLN
40.0000 mg | Freq: Two times a day (BID) | SUBCUTANEOUS | Status: DC
  Administered 2015-10-02: 40 mg via SUBCUTANEOUS
  Filled 2015-10-02: qty 0.4

## 2015-10-02 MED ORDER — OXCARBAZEPINE 300 MG PO TABS
600.0000 mg | ORAL_TABLET | Freq: Two times a day (BID) | ORAL | Status: DC
  Administered 2015-10-02 – 2015-10-04 (×4): 600 mg via ORAL
  Filled 2015-10-02 (×6): qty 2

## 2015-10-02 MED ORDER — LORATADINE 10 MG PO TABS
10.0000 mg | ORAL_TABLET | Freq: Every day | ORAL | Status: DC
  Administered 2015-10-03 – 2015-10-04 (×2): 10 mg via ORAL
  Filled 2015-10-02 (×2): qty 1

## 2015-10-02 MED ORDER — ENOXAPARIN SODIUM 150 MG/ML ~~LOC~~ SOLN
1.0000 mg/kg | Freq: Two times a day (BID) | SUBCUTANEOUS | Status: DC
  Administered 2015-10-03: 240 mg via SUBCUTANEOUS
  Filled 2015-10-02 (×2): qty 1.59

## 2015-10-02 MED ORDER — CLINDAMYCIN PHOSPHATE 900 MG/50ML IV SOLN
900.0000 mg | Freq: Once | INTRAVENOUS | Status: AC
  Administered 2015-10-02: 900 mg via INTRAVENOUS
  Filled 2015-10-02: qty 50

## 2015-10-02 MED ORDER — ACETAMINOPHEN 500 MG PO TABS
1000.0000 mg | ORAL_TABLET | Freq: Once | ORAL | Status: AC
  Administered 2015-10-02: 1000 mg via ORAL
  Filled 2015-10-02: qty 2

## 2015-10-02 MED ORDER — CLONAZEPAM 0.5 MG PO TABS
0.5000 mg | ORAL_TABLET | Freq: Two times a day (BID) | ORAL | Status: DC
  Administered 2015-10-02 – 2015-10-04 (×4): 0.5 mg via ORAL
  Filled 2015-10-02 (×4): qty 1

## 2015-10-02 MED ORDER — GABAPENTIN 300 MG PO CAPS
300.0000 mg | ORAL_CAPSULE | Freq: Four times a day (QID) | ORAL | Status: DC
  Administered 2015-10-02 – 2015-10-04 (×8): 300 mg via ORAL
  Filled 2015-10-02 (×8): qty 1

## 2015-10-02 MED ORDER — SODIUM CHLORIDE 0.9 % IJ SOLN
3.0000 mL | Freq: Two times a day (BID) | INTRAMUSCULAR | Status: DC
  Administered 2015-10-02 – 2015-10-03 (×4): 3 mL via INTRAVENOUS

## 2015-10-02 MED ORDER — VERAPAMIL HCL ER 120 MG PO TBCR
120.0000 mg | EXTENDED_RELEASE_TABLET | Freq: Every day | ORAL | Status: DC
  Administered 2015-10-02 – 2015-10-03 (×2): 120 mg via ORAL
  Filled 2015-10-02 (×3): qty 1

## 2015-10-02 MED ORDER — NITROGLYCERIN 2 % TD OINT
0.5000 [in_us] | TOPICAL_OINTMENT | Freq: Three times a day (TID) | TRANSDERMAL | Status: DC
  Administered 2015-10-02 – 2015-10-03 (×4): 0.5 [in_us] via TOPICAL
  Filled 2015-10-02 (×5): qty 1

## 2015-10-02 MED ORDER — MOMETASONE FURO-FORMOTEROL FUM 100-5 MCG/ACT IN AERO
2.0000 | INHALATION_SPRAY | Freq: Two times a day (BID) | RESPIRATORY_TRACT | Status: DC
  Administered 2015-10-02 – 2015-10-04 (×4): 2 via RESPIRATORY_TRACT
  Filled 2015-10-02: qty 8.8

## 2015-10-02 MED ORDER — ZIPRASIDONE HCL 60 MG PO CAPS
60.0000 mg | ORAL_CAPSULE | Freq: Two times a day (BID) | ORAL | Status: DC
  Administered 2015-10-02 – 2015-10-04 (×4): 60 mg via ORAL
  Filled 2015-10-02 (×6): qty 1

## 2015-10-02 MED ORDER — ENOXAPARIN SODIUM 40 MG/0.4ML ~~LOC~~ SOLN
40.0000 mg | SUBCUTANEOUS | Status: DC
Start: 2015-10-02 — End: 2015-10-02

## 2015-10-02 MED ORDER — ACETAMINOPHEN 325 MG PO TABS: 650.0000 mg | ORAL_TABLET | Freq: Four times a day (QID) | ORAL | Status: DC | PRN

## 2015-10-02 MED ORDER — HEPARIN SODIUM (PORCINE) 5000 UNIT/ML IJ SOLN: 5000.0000 [IU] | Freq: Three times a day (TID) | INTRAMUSCULAR | Status: DC

## 2015-10-02 MED ORDER — ACETAMINOPHEN 650 MG RE SUPP: 650.0000 mg | Freq: Four times a day (QID) | RECTAL | Status: DC | PRN

## 2015-10-02 MED ORDER — ZOLPIDEM TARTRATE 5 MG PO TABS
5.0000 mg | ORAL_TABLET | Freq: Every evening | ORAL | Status: DC | PRN
  Administered 2015-10-02 – 2015-10-03 (×2): 5 mg via ORAL
  Filled 2015-10-02 (×2): qty 1

## 2015-10-02 MED ORDER — CLINDAMYCIN HCL 150 MG PO CAPS
300.0000 mg | ORAL_CAPSULE | Freq: Four times a day (QID) | ORAL | Status: DC
  Administered 2015-10-03: 300 mg via ORAL
  Filled 2015-10-02 (×2): qty 2

## 2015-10-02 MED ORDER — LEVOTHYROXINE SODIUM 75 MCG PO TABS
75.0000 ug | ORAL_TABLET | Freq: Every day | ORAL | Status: DC
Start: 2015-10-03 — End: 2015-10-04
  Administered 2015-10-03 – 2015-10-04 (×2): 75 ug via ORAL
  Filled 2015-10-02 (×2): qty 1

## 2015-10-02 MED ORDER — SIMETHICONE 80 MG PO CHEW
80.0000 mg | CHEWABLE_TABLET | Freq: Four times a day (QID) | ORAL | Status: DC | PRN
  Filled 2015-10-02: qty 1

## 2015-10-02 MED ORDER — ACETAMINOPHEN 325 MG PO TABS
650.0000 mg | ORAL_TABLET | Freq: Four times a day (QID) | ORAL | Status: DC | PRN
  Administered 2015-10-02 – 2015-10-04 (×5): 650 mg via ORAL
  Filled 2015-10-02 (×6): qty 2

## 2015-10-02 MED ORDER — ONDANSETRON HCL 4 MG/2ML IJ SOLN: 4.0000 mg | Freq: Four times a day (QID) | INTRAMUSCULAR | Status: DC | PRN

## 2015-10-02 MED ORDER — ALUM & MAG HYDROXIDE-SIMETH 200-200-20 MG/5ML PO SUSP: 30.0000 mL | ORAL | Status: DC | PRN

## 2015-10-02 MED ORDER — GUAIFENESIN 100 MG/5ML PO SOLN
200.0000 mg | Freq: Three times a day (TID) | ORAL | Status: DC | PRN
  Filled 2015-10-02: qty 10

## 2015-10-02 MED ORDER — CLINDAMYCIN HCL 150 MG PO CAPS: 300.0000 mg | ORAL_CAPSULE | Freq: Three times a day (TID) | ORAL | Status: DC

## 2015-10-02 MED ORDER — SODIUM CHLORIDE 0.9 % IV BOLUS (SEPSIS)
1000.0000 mL | Freq: Once | INTRAVENOUS | Status: AC
Start: 2015-10-02 — End: 2015-10-02
  Administered 2015-10-02: 1000 mL via INTRAVENOUS

## 2015-10-02 MED ORDER — ENOXAPARIN SODIUM 40 MG/0.4ML ~~LOC~~ SOLN: 40.0000 mg | SUBCUTANEOUS | Status: DC

## 2015-10-02 MED ORDER — MONTELUKAST SODIUM 10 MG PO TABS
10.0000 mg | ORAL_TABLET | Freq: Every day | ORAL | Status: DC
  Administered 2015-10-03 – 2015-10-04 (×2): 10 mg via ORAL
  Filled 2015-10-02 (×2): qty 1

## 2015-10-02 MED ORDER — METOPROLOL TARTRATE 25 MG PO TABS
25.0000 mg | ORAL_TABLET | Freq: Two times a day (BID) | ORAL | Status: DC
  Administered 2015-10-02: 25 mg via ORAL
  Filled 2015-10-02: qty 1

## 2015-10-02 MED ORDER — ONDANSETRON HCL 4 MG PO TABS: 4.0000 mg | ORAL_TABLET | Freq: Four times a day (QID) | ORAL | Status: DC | PRN

## 2015-10-02 MED ORDER — TIOTROPIUM BROMIDE MONOHYDRATE 18 MCG IN CAPS
18.0000 ug | ORAL_CAPSULE | Freq: Every day | RESPIRATORY_TRACT | Status: DC
  Administered 2015-10-03 – 2015-10-04 (×2): 18 ug via RESPIRATORY_TRACT
  Filled 2015-10-02: qty 5

## 2015-10-02 MED ORDER — SENNOSIDES-DOCUSATE SODIUM 8.6-50 MG PO TABS
1.0000 | ORAL_TABLET | Freq: Two times a day (BID) | ORAL | Status: DC
  Administered 2015-10-02 – 2015-10-04 (×4): 1 via ORAL
  Filled 2015-10-02 (×5): qty 1

## 2015-10-02 MED ORDER — METOPROLOL TARTRATE 25 MG PO TABS
25.0000 mg | ORAL_TABLET | Freq: Four times a day (QID) | ORAL | Status: DC
  Administered 2015-10-03 – 2015-10-04 (×6): 25 mg via ORAL
  Filled 2015-10-02 (×7): qty 1

## 2015-10-02 NOTE — ED Provider Notes (Signed)
[facility] Emergency Department Provider Note  ____________________________________________  Time seen: Approximately 11:35 AM  I have reviewed the triage vital signs and the nursing notes.   HISTORY  Chief Complaint Shortness of Breath    HPI Adrian DiegoCarlton B Cargle Jr. is a 54 y.o. male the history of HTN, HL, DM, asthma, seizures, bipolar disorder presenting "because I didn't feel good." Patient does not endorse any specific medical symptoms, but states she has felt sad recently without SI, HI, or hallucinations. He denies any cough or cold symptoms, runny nose, sore throat, ear pain, nausea or vomiting, diarrhea, abdominal pain, chest pain, palpitations, or shortness of breath. At baseline, he is nonambulatory and lives with his mother and sister. EMS noted him to have shortness of breath and weakness. On arrival to the emergency department the patient was found to be febrile, tachycardic, tachypneic without hypoxia, and hypertensive.   Past Medical History  Diagnosis Date  . Asthma   . Hypertension   . Hyperlipemia   . Diabetes mellitus without complication (HCC)   . Hypothyroidism   . Bipolar disorder (HCC)   . Sleep apnea, obstructive   . Depression     Patient Active Problem List   Diagnosis Date Noted  . Allergic rhinitis, cause unspecified 02/24/2014  . Unspecified constipation 02/24/2014  . Essential hypertension, benign 02/24/2014  . UTI (lower urinary tract infection) 01/16/2014  . Altered mental status 01/16/2014  . Diabetic osteomyelitis (HCC) 01/03/2014  . Diabetic neuropathy (HCC) 01/03/2014  . Bipolar disorder, unspecified (HCC) 01/03/2014  . Unspecified hypothyroidism 01/03/2014  . Obstructive sleep apnea 01/03/2014  . Asthma, chronic 01/03/2014  . Seborrheic dermatitis of scalp 01/03/2014  . Morbid obesity (HCC) 01/03/2014    Past Surgical History  Procedure Laterality Date  . Tonsillectomy    . Bone removed      bone removed  in foot due to infection     Current Outpatient Rx  Name  Route  Sig  Dispense  Refill  . acetaminophen (TYLENOL) 325 MG tablet   Oral   Take 650 mg by mouth every 6 (six) hours as needed for mild pain.         Marland Kitchen. alum & mag hydroxide-simeth (MAALOX/MYLANTA) 200-200-20 MG/5ML suspension   Oral   Take 30 mLs by mouth every 4 (four) hours as needed for indigestion or heartburn.         . cefUROXime (CEFTIN) 500 MG tablet   Oral   Take 1 tablet (500 mg total) by mouth 2 (two) times daily with a meal.   14 tablet   0   . cephALEXin (KEFLEX) 500 MG capsule   Oral   Take 1 capsule (500 mg total) by mouth 3 (three) times daily.   21 capsule   0   . clonazePAM (KLONOPIN) 0.5 MG tablet      Take one tablet by mouth twice daily   60 tablet   5   . enoxaparin (LOVENOX) 40 MG/0.4ML injection   Subcutaneous   Inject 40 mg into the skin daily.         . Fluticasone-Salmeterol (ADVAIR) 250-50 MCG/DOSE AEPB   Inhalation   Inhale 1 puff into the lungs 2 (two) times daily.         Marland Kitchen. gabapentin (NEURONTIN) 300 MG capsule   Oral   Take 300 mg by mouth 4 (four) times daily.         Marland Kitchen. guaifenesin (ROBITUSSIN) 100 MG/5ML syrup   Oral  Take 200 mg by mouth 3 (three) times daily as needed for cough.         . levothyroxine (SYNTHROID, LEVOTHROID) 75 MCG tablet   Oral   Take 75 mcg by mouth daily before breakfast.         . loratadine (CLARITIN) 10 MG tablet   Oral   Take 10 mg by mouth daily.         . magnesium hydroxide (MILK OF MAGNESIA) 400 MG/5ML suspension   Oral   Take 30 mLs by mouth daily.         . metFORMIN (GLUCOPHAGE) 1000 MG tablet   Oral   Take 1,000 mg by mouth 2 (two) times daily with a meal.         . metoprolol tartrate (LOPRESSOR) 25 MG tablet   Oral   Take 25 mg by mouth 2 (two) times daily.         . montelukast (SINGULAIR) 10 MG tablet   Oral   Take 10 mg by mouth daily.         Marland Kitchen nystatin (MYCOSTATIN/NYSTOP) 100000 UNIT/GM  POWD      3 times a day - Apply to the area under the left pannus after washing with soap and water and allowing the area to dry.   1 Bottle   0   . oxcarbazepine (TRILEPTAL) 600 MG tablet   Oral   Take 600 mg by mouth 2 (two) times daily.         . sennosides-docusate sodium (SENOKOT-S) 8.6-50 MG tablet   Oral   Take 1 tablet by mouth 2 (two) times daily.         . simethicone (MYLICON) 125 MG chewable tablet   Oral   Chew 125 mg by mouth every 6 (six) hours as needed for flatulence.         . tiotropium (SPIRIVA) 18 MCG inhalation capsule   Inhalation   Place 18 mcg into inhaler and inhale daily.         Marland Kitchen torsemide (DEMADEX) 20 MG tablet   Oral   Take 20 mg by mouth daily.         . verapamil (CALAN-SR) 120 MG CR tablet   Oral   Take 120 mg by mouth at bedtime.         . ziprasidone (GEODON) 60 MG capsule   Oral   Take 60 mg by mouth 2 (two) times daily with a meal.         . zolpidem (AMBIEN) 10 MG tablet      Take one tablet by mouth every night at bedtime as needed for rest   10 tablet   0     Allergies Review of patient's allergies indicates no known allergies.  Family History  Problem Relation Age of Onset  . Heart failure Mother   . Stroke Father     Social History Social History  Substance Use Topics  . Smoking status: Never Smoker   . Smokeless tobacco: None  . Alcohol Use: No    Review of Systems Constitutional: he reports no known fever/chills. No lightheadedness or syncope. Eyes: No visual changes. ENT: No sore throat. Cardiovascular: Denies chest pain, palpitations. Respiratory: Denies shortness of breath.  No cough. Gastrointestinal: No abdominal pain.  No nausea, no vomiting.  No diarrhea.  No constipation. Genitourinary: Negative for dysuria. Musculoskeletal: Negative for back pain. Skin: Negative for rash. Positive "crack" in the skin in his lower pannus.  Neurological: Negative for headaches, focal weakness or  numbness. Psychiatric:Depressed, no SI, HI or hallucinations 10-point ROS otherwise negative.  ____________________________________________   PHYSICAL EXAM:  VITAL SIGNS: ED Triage Vitals  Enc Vitals Group     BP 10/02/15 1114 197/107 mmHg     Pulse Rate 10/02/15 1114 132     Resp 10/02/15 1114 30     Temp 10/02/15 1114 102.7 F (39.3 C)     Temp Source 10/02/15 1114 Oral     SpO2 10/02/15 1114 100 %     Weight --      Height --      Head Cir --      Peak Flow --      Pain Score 10/02/15 1118 2     Pain Loc --      Pain Edu? --      Excl. in GC? --     Constitutional: Alert and oriented and answering questions properly. Medically ill appearing and mildly disheveled. Eyes: Conjunctivae are normal.  EOMI. no scleral icterus. No discharge. Head: Atraumatic. Nose: No congestion/rhinnorhea. Mouth/Throat: Mucous membranes are moist.  Neck: No stridor.  Supple.  No meningismus. Cardiovascular: Normal rate, regular rhythm. No murmurs, rubs or gallops.  Respiratory: Tachypnea and able to speak in full sentences. Positive accessory muscle use but no retractions. No wheezes rales or rhonchi. Gastrointestinal: Abdomen is morbidly obese, soft, nondistended, nontender. In the lower pannus the patient has induration diffusely of the skin that is consistent with chronic changes. He also has erythema in the lower pannus and radiating over the left lower quadrant with associated warmth. He has skin breakdown with some crusted yellow discharge in the central part of the lower pannus. Given his overweight size, I am unable to lift up his pannus by myself to evaluate the line with the perineum. Musculoskeletal: Symmetric bilateral lower extremity edema with a 1 x 3 cm area of skin breakdown without significant swelling or erythema over the distal right tibial shaft, and a 4 x 10 cm area of skin breakdown with underlying erythema over the distal left tibia. Refill is less than 2 seconds in the  feet. Neurologic:  Normal speech and language. No gross focal neurologic deficits are appreciated.  Skin:  Skin is warm, dry and intact. No rash noted. Psychiatric: Pressed mood and flat affect. Speech and behavior are normal.  Normal judgement.  ____________________________________________   LABS (all labs ordered are listed, but only abnormal results are displayed)  Labs Reviewed  CBC - Abnormal; Notable for the following:    WBC 30.9 (*)    RBC 3.56 (*)    Hemoglobin 9.6 (*)    HCT 30.7 (*)    MCHC 31.4 (*)    RDW 17.5 (*)    All other components within normal limits  COMPREHENSIVE METABOLIC PANEL - Abnormal; Notable for the following:    Chloride 97 (*)    Glucose, Bld 131 (*)    BUN 29 (*)    Creatinine, Ser 1.49 (*)    Calcium 8.8 (*)    GFR calc non Af Amer 52 (*)    GFR calc Af Amer 60 (*)    All other components within normal limits  BLOOD GAS, VENOUS - Abnormal; Notable for the following:    pH, Ven 7.46 (*)    pCO2, Ven 40 (*)    Bicarbonate 28.4 (*)    Acid-Base Excess 4.2 (*)    All other components within normal limits  LACTIC ACID,  PLASMA - Abnormal; Notable for the following:    Lactic Acid, Venous 3.4 (*)    All other components within normal limits  URINALYSIS COMPLETEWITH MICROSCOPIC ([facility] ONLY) - Abnormal; Notable for the following:    Color, Urine YELLOW (*)    APPearance CLEAR (*)    Protein, ur 100 (*)    Bacteria, UA RARE (*)    Squamous Epithelial / LPF 0-5 (*)    All other components within normal limits  URINE CULTURE  CULTURE, BLOOD (ROUTINE X 2)  CULTURE, BLOOD (ROUTINE X 2)  LACTIC ACID, PLASMA   ____________________________________________  EKG  ED ECG REPORT I, Rockne Menghini, the attending physician, personally viewed and interpreted this ECG.   Date: 10/02/2015  EKG Time: 1135  Rate: 134  Rhythm: sinus tachycardia  Axis: normal  Intervals:none  ST&T Change: No ST  elevation  ____________________________________________  RADIOLOGY  Dg Chest Port 1 View  10/02/2015  CLINICAL DATA:  Fever x 2 days, h/o htn, non smoker EXAM: PORTABLE CHEST 1 VIEW COMPARISON:  01/15/2014 FINDINGS: Mildly degraded exam due to AP portable technique and patient body habitus. Both views are also minimally motion degraded. Midline trachea. Moderate cardiomegaly. No right-sided and no definite left pleural effusion. No pneumothorax. Pulmonary interstitial prominence is favored to be technique related; due to low lung volumes. Cannot exclude bibasilar airspace disease. IMPRESSION: Decreased sensitivity and specificity exam due to technique related factors, as described above. Cardiomegaly, without overt congestive failure. Possible bibasilar airspace disease. Electronically Signed   By: Jeronimo Greaves M.D.   On: 10/02/2015 12:30    ____________________________________________   PROCEDURES  Procedure(s) performed: None  Critical Care performed: Yes ____________________________________________   INITIAL IMPRESSION / ASSESSMENT AND PLAN / ED COURSE  Pertinent labs & imaging results that were available during my care of the patient were reviewed by me and considered in my medical decision making (see chart for details).  54 y.o. male with multiple chronic medical illnesses as well as psychiatric bipolar disorder resenting with generally feeling bad but objective signs of fever, tachycardia, tachypnea without hypoxia, and clinical symptoms of at least some cellulitis in the lower extremities and the lower abdomen. I will also evaluate him for urinary tract infection, pneumonia, and bacteremia. The patient will need admission to the hospital.  ----------------------------------------- 12:55 PM on 10/02/2015 -----------------------------------------  The patient has signs and symptoms of sepsis in the setting of an abdominal and left lower extremity cellulitis. His chest x-ray is  limited, but given that he has had no cough and is not hypoxic, it is unlikely that he has pneumonia. He does have an elevated white blood cell count of 30.9, and in acute renal insufficiency, as well as a lactic acid level of 3.4. I've given him clindamycin for his cellulitis, and IV fluids. Plan admission. CRITICAL CARE Performed by: Rockne Menghini   Total critical care time: 35 minutes  Critical care time was exclusive of separately billable procedures and treating other patients.  Critical care was necessary to treat or prevent imminent or life-threatening deterioration.  Critical care was time spent personally by me on the following activities: development of treatment plan with patient and/or surrogate as well as nursing, discussions with consultants, evaluation of patient's response to treatment, examination of patient, obtaining history from patient or surrogate, ordering and performing treatments and interventions, ordering and review of laboratory studies, ordering and review of radiographic studies, pulse oximetry and re-evaluation of patient's condition.   ____________________________________________  FINAL CLINICAL IMPRESSION(S) / ED  DIAGNOSES  Final diagnoses:  Sepsis, due to unspecified organism (HCC)  Cellulitis of abdominal wall  Cellulitis of left lower extremity      NEW MEDICATIONS STARTED DURING THIS VISIT:  New Prescriptions   No medications on file     Rockne Menghini, MD 10/02/15 1259

## 2015-10-02 NOTE — Progress Notes (Signed)
CRITICAL VALUE ALERT  Critical value received:  Lactic acid 2.9   Date of notification:  10/02/2015  Time of notification:  1945 Critical value read back: yes   Nurse who received alert:  Owens SharkJasmine Mulki Roesler  MD notified (1st page):  Notification giving to RN caring for patient Adrian BeechJennifer  [name]   Time of first page:   MD notified (2nd page):  Time of second page:  Responding MD:   Time MD responded:

## 2015-10-02 NOTE — ED Notes (Signed)
Notified of critical lab value, lactic acid 3.4. EDP made aware

## 2015-10-02 NOTE — H&P (Addendum)
[facility] [place] [facility]   PATIENT NAME: Adrian ConesCarlton [name]    MR#:  [id number]  DATE OF BIRTH:  Jan 12, 1961  DATE OF ADMISSION:  10/02/2015  PRIMARY CARE PHYSICIAN: No PCP Per Patient   REQUESTING/REFERRING PHYSICIAN:   CHIEF COMPLAINT:   Chief Complaint  Patient presents with  . Shortness of Breath    HISTORY OF PRESENT ILLNESS: Adrian [name]  is a 54 y.o. male with a known history of multiple medical problems including morbid obesity, CHF, diabetes mellitus, hypertension, hyperlipidemia, asthma, bipolar disorder who presents to the hospital just not feeling well. In emergency room, he was noted to be febrile with temperature of about 102, he was also noted to have severe leukocytosis with white blood cell count of about 30,000. Labs also revealed renal insufficiency, elevated lactic acid level, mild anemia, urinalysis was unremarkable. Chest x-ray was difficult to interpret due to morbid obesity. Patient denied any cough or phlegm production, although he denied having fevers as well. Patient's physical exam revealed purulence in the creases in his pannus as well as some weeping in the and redness in his lower extremities, concerning for cellulitis. Hospitalist services were contacted for admission  PAST MEDICAL HISTORY:   Past Medical History  Diagnosis Date  . Asthma   . Hypertension   . Hyperlipemia   . Diabetes mellitus without complication (HCC)   . Hypothyroidism   . Bipolar disorder (HCC)   . Sleep apnea, obstructive   . Depression     PAST SURGICAL HISTORY:  Past Surgical History  Procedure Laterality Date  . Tonsillectomy    . Bone removed      bone removed in foot due to infection     SOCIAL HISTORY:  Social History  Substance Use Topics  . Smoking status: Never Smoker   . Smokeless tobacco: Not on file  . Alcohol Use: No    FAMILY HISTORY:  Family History  Problem Relation Age of Onset  . Heart failure Mother   .  Stroke Father     DRUG ALLERGIES: No Known Allergies  Review of Systems  Constitutional: Positive for malaise/fatigue. Negative for fever, chills and weight loss.  HENT: Negative for congestion.   Eyes: Negative for blurred vision and double vision.  Respiratory: Negative for cough, sputum production, shortness of breath and wheezing.   Cardiovascular: Negative for chest pain, palpitations, orthopnea, leg swelling and PND.  Gastrointestinal: Negative for nausea, vomiting, abdominal pain, diarrhea, constipation, blood in stool and melena.  Genitourinary: Negative for dysuria, urgency, frequency and hematuria.  Musculoskeletal: Negative for falls.  Skin: Negative for rash.  Neurological: Negative for dizziness and weakness.  Psychiatric/Behavioral: Negative for depression and memory loss. The patient is not nervous/anxious.     MEDICATIONS AT HOME:  Prior to Admission medications   Medication Sig Start Date End Date Taking? Authorizing Provider  acetaminophen (TYLENOL) 325 MG tablet Take 650 mg by mouth every 6 (six) hours as needed for mild pain.    Historical Provider, MD  alum & mag hydroxide-simeth (MAALOX/MYLANTA) 200-200-20 MG/5ML suspension Take 30 mLs by mouth every 4 (four) hours as needed for indigestion or heartburn.    Historical Provider, MD  cefUROXime (CEFTIN) 500 MG tablet Take 1 tablet (500 mg total) by mouth 2 (two) times daily with a meal. 01/17/14   Clydia LlanoMutaz Elmahi, MD  cephALEXin (KEFLEX) 500 MG capsule Take 1 capsule (500 mg total) by mouth 3 (three) times daily. 06/10/15   Darien Ramusavid W Kaminski, MD  clonazePAM (KLONOPIN) 0.5 MG tablet Take one tablet by mouth twice daily 01/27/14   Tiffany L Reed, DO  enoxaparin (LOVENOX) 40 MG/0.4ML injection Inject 40 mg into the skin daily.    Historical Provider, MD  Fluticasone-Salmeterol (ADVAIR) 250-50 MCG/DOSE AEPB Inhale 1 puff into the lungs 2 (two) times daily.    Historical Provider, MD  gabapentin (NEURONTIN) 300 MG capsule Take 300  mg by mouth 4 (four) times daily.    Historical Provider, MD  guaifenesin (ROBITUSSIN) 100 MG/5ML syrup Take 200 mg by mouth 3 (three) times daily as needed for cough.    Historical Provider, MD  levothyroxine (SYNTHROID, LEVOTHROID) 75 MCG tablet Take 75 mcg by mouth daily before breakfast.    Historical Provider, MD  loratadine (CLARITIN) 10 MG tablet Take 10 mg by mouth daily.    Historical Provider, MD  magnesium hydroxide (MILK OF MAGNESIA) 400 MG/5ML suspension Take 30 mLs by mouth daily.    Historical Provider, MD  metFORMIN (GLUCOPHAGE) 1000 MG tablet Take 1,000 mg by mouth 2 (two) times daily with a meal.    Historical Provider, MD  metoprolol tartrate (LOPRESSOR) 25 MG tablet Take 25 mg by mouth 2 (two) times daily.    Historical Provider, MD  montelukast (SINGULAIR) 10 MG tablet Take 10 mg by mouth daily.    Historical Provider, MD  nystatin (MYCOSTATIN/NYSTOP) 100000 UNIT/GM POWD 3 times a day - Apply to the area under the left pannus after washing with soap and water and allowing the area to dry. 06/10/15   Darien Ramus, MD  oxcarbazepine (TRILEPTAL) 600 MG tablet Take 600 mg by mouth 2 (two) times daily.    Historical Provider, MD  sennosides-docusate sodium (SENOKOT-S) 8.6-50 MG tablet Take 1 tablet by mouth 2 (two) times daily.    Historical Provider, MD  simethicone (MYLICON) 125 MG chewable tablet Chew 125 mg by mouth every 6 (six) hours as needed for flatulence.    Historical Provider, MD  tiotropium (SPIRIVA) 18 MCG inhalation capsule Place 18 mcg into inhaler and inhale daily.    Historical Provider, MD  torsemide (DEMADEX) 20 MG tablet Take 20 mg by mouth daily.    Historical Provider, MD  verapamil (CALAN-SR) 120 MG CR tablet Take 120 mg by mouth at bedtime.    Historical Provider, MD  ziprasidone (GEODON) 60 MG capsule Take 60 mg by mouth 2 (two) times daily with a meal.    Historical Provider, MD  zolpidem (AMBIEN) 10 MG tablet Take one tablet by mouth every night at  bedtime as needed for rest 01/17/14   Clydia Llano, MD      PHYSICAL EXAMINATION:   VITAL SIGNS: Blood pressure 126/61, pulse 131, temperature 99.7 F (37.6 C), temperature source Oral, resp. rate 30, SpO2 94 %.  GENERAL:  54 y.o.-year-old patient lying in the bed with no acute distress. Somewhat flushed and face, restless, otherwise, overall comfortable EYES: Pupils equal, round, reactive to light and accommodation. No scleral icterus. Extraocular muscles intact.  HEENT: Head atraumatic, normocephalic. Oropharynx and nasopharynx clear.  NECK:  Supple, no jugular venous distention. No thyroid enlargement, no tenderness.  LUNGS: Normal breath sounds bilaterally, no wheezing, rales,rhonchi or crepitation. No use of accessory muscles of respiration, unless with exertion.  CARDIOVASCULAR: S1, S2 , acute cardiac. Distant sounds due to severe obesity. Unable to hear murmurs, rubs, or gallops.  ABDOMEN: Soft, nontender, nondistended. Bowel sounds present. No organomegaly or mass.  EXTREMITIES: Severe lower extremity and pedal edema, no cyanosis, or  clubbing. Erythema of the left lower extremity, increased warmth ulcerations weeping bilateral lower extremities,  NEUROLOGIC: Cranial nerves II through XII are intact. Muscle strength 5/5 in all extremities. Sensation intact. Gait not checked.  PSYCHIATRIC: The patient is alert and oriented x 2, disoriented in situation.  SKIN:  bilateral lower extremity rashes, lesion, ulcerations, erythema of left lower extremity, scabbing, fissure in the skin and purulence under the pannus.   LABORATORY PANEL:   CBC  Recent Labs Lab 10/02/15 1139  WBC 30.9*  HGB 9.6*  HCT 30.7*  PLT 373  MCV 86.4  MCH 27.1  MCHC 31.4*  RDW 17.5*   ------------------------------------------------------------------------------------------------------------------  Chemistries   Recent Labs Lab 10/02/15 1139  NA 136  K 4.7  CL 97*  CO2 26  GLUCOSE 131*  BUN 29*   CREATININE 1.49*  CALCIUM 8.8*  AST 28  ALT 26  ALKPHOS 83  BILITOT 0.7   ------------------------------------------------------------------------------------------------------------------  Cardiac Enzymes No results for input(s): TROPONINI in the last 168 hours. ------------------------------------------------------------------------------------------------------------------  RADIOLOGY: Dg Chest Port 1 View  10/02/2015  CLINICAL DATA:  Fever x 2 days, h/o htn, non smoker EXAM: PORTABLE CHEST 1 VIEW COMPARISON:  01/15/2014 FINDINGS: Mildly degraded exam due to AP portable technique and patient body habitus. Both views are also minimally motion degraded. Midline trachea. Moderate cardiomegaly. No right-sided and no definite left pleural effusion. No pneumothorax. Pulmonary interstitial prominence is favored to be technique related; due to low lung volumes. Cannot exclude bibasilar airspace disease. IMPRESSION: Decreased sensitivity and specificity exam due to technique related factors, as described above. Cardiomegaly, without overt congestive failure. Possible bibasilar airspace disease. Electronically Signed   By: Jeronimo Greaves M.D.   On: 10/02/2015 12:30    EKG: Orders placed or performed in visit on 10/04/13  . EKG 12-Lead   EKG in the emergency room reveals sinus tachycardia at 134 bpm, normal axis, low voltage QRS in precordial leads. No acute ST-T changes IMPRESSION AND PLAN:  Active Problems:   Sepsis (HCC) 1. Sepsis due to cellulitis, admit patient, medical floor, get blood cultures as well as wound cultures. Initiate patient on clindamycin intravenously, following culture results 2. Bilateral lower extremity and the under pannus cellulitis, left lower extremity more than the right Lower extremity , continue antibiotic therapy following clinically. Get wound cultures, and the wound care follow-up  3. Renal insufficiency, continue patient on low rate IV fluids. Hold Glucophage ,  following kidney function in the morning , unremarkable urinalysis  4. Leukocytosis. Follow with therapy    All the records are reviewed and case discussed with ED provider. Management plans discussed with the patient, family and they are in agreement.  CODE STATUS:    TOTAL TIME TAKING CARE OF THIS PATIENT: 55 minutes.    Katharina Caper M.D on 10/02/2015 at 1:58 PM  Between 7am to 6pm - Pager - [phone] After 6pm go to [email or web address] - password [email or web address] [facility]  [place] [facility]  Office  [phone]  CC: Primary care physician; No PCP Per Patient

## 2015-10-02 NOTE — Progress Notes (Signed)
ANTIBIOTIC CONSULT NOTE - INITIAL  Pharmacy Consult for Clindamycin dosing Indication: cellulitis  No Known Allergies  Patient Measurements:     Vital Signs: Temp: 99.7 F (37.6 C) (12/16 1349) Temp Source: Oral (12/16 1114) BP: 126/61 mmHg (12/16 1339) Pulse Rate: 131 (12/16 1339) Intake/Output from previous day:   Intake/Output from this shift:    Labs:  Recent Labs  10/02/15 1139  WBC 30.9*  HGB 9.6*  PLT 373  CREATININE 1.49*   CrCl cannot be calculated (Unknown ideal weight.). No results for input(s): VANCOTROUGH, VANCOPEAK, VANCORANDOM, GENTTROUGH, GENTPEAK, GENTRANDOM, TOBRATROUGH, TOBRAPEAK, TOBRARND, AMIKACINPEAK, AMIKACINTROU, AMIKACIN in the last 72 hours.   Microbiology: Recent Results (from the past 720 hour(s))  Blood culture (routine x 2)     Status: None (Preliminary result)   Collection Time: 10/02/15 11:36 AM  Result Value Ref Range Status   Specimen Description BLOOD RIGHT CHEST  Final   Special Requests BOTTLES DRAWN AEROBIC AND ANAEROBIC  1CC  Final   Culture NO GROWTH <12 HOURS  Final   Report Status PENDING  Incomplete  Blood culture (routine x 2)     Status: None (Preliminary result)   Collection Time: 10/02/15 11:40 AM  Result Value Ref Range Status   Specimen Description BLOOD RIGHT AC  Final   Special Requests BOTTLES DRAWN AEROBIC AND ANAEROBIC  1CC  Final   Culture NO GROWTH <12 HOURS  Final   Report Status PENDING  Incomplete    Medical History: Past Medical History  Diagnosis Date  . Asthma   . Hypertension   . Hyperlipemia   . Diabetes mellitus without complication (HCC)   . Hypothyroidism   . Bipolar disorder (HCC)   . Sleep apnea, obstructive   . Depression     Medications:  Scheduled:  . clindamycin  300 mg Oral 4 times per day  . heparin subcutaneous  5,000 Units Subcutaneous 3 times per day   Assessment: Patient is a 54 yo male admitted with cellulitis of the abdomen and lower extremities.  MD consulted to  pharmacy to dose oral clindamycin for cellulitis.   Goal of Therapy:  Resolution of infection  Plan:  Patient currently ordered clindamycin 300 mg po q8h.  Will transition patient to clindamycin 300 mg po q6h.  Clindamycin does not require renal adjustments.  Wound cultures pending.  Pharmacy will continue to follow. Follow up culture results  Breeana Sawtelle G 10/02/2015,2:05 PM

## 2015-10-02 NOTE — Progress Notes (Addendum)
Patient not accurate reporter of what meds he has taken prior to EMS bringing him to the hospital. Once daily meds were not given because patient did not remember what they had already taken.

## 2015-10-02 NOTE — ED Notes (Signed)
Pt arrived via EMS with reports of SOB and weakness. Pt has hx of psychiatric diease and states that this time of year he usually goes downhill

## 2015-10-02 NOTE — Care Management Note (Signed)
Case Management Note  Patient Details  Name: Adrian DiegoCarlton B Swingle Jr. MRN: [id number] Date of Birth: 05-31-1961  Subjective/Objective:   54yo Mr Adrian [name] was admitted 10/02/15 per temp=102 and WBC= 30,000. Dx: Cellulitis of the lower extremities and lower abdomen. Hx: bipolar DO, HTN, DM, sleep apnea, CHF, Asthma. Resides with Mother and sister. [facility]  Pharmacy= [facility] [address].  Home equipment includes a bariatric rolling walker, BSC, and a CPAP machine. No home oxygen. No home health. Has a Engineer, maintenanceprivate nurse aid [facility]. Per nurse Reeves DamJadie RN, a bariatric bed has been ordered and should arrive later today. Case management will follow for discharge planning.               Action/Plan:   Expected Discharge Date:                  Expected Discharge Plan:     In-House Referral:     Discharge planning Services     Post Acute Care Choice:    Choice offered to:     DME Arranged:    DME Agency:     HH Arranged:    HH Agency:     Status of Service:     Medicare Important Message Given:    Date Medicare IM Given:    Medicare IM give by:    Date Additional Medicare IM Given:    Additional Medicare Important Message give by:     If discussed at Long Length of Stay Meetings, dates discussed:    Additional Comments:  Nayvie Lips A, RN 10/02/2015, 3:01 PM

## 2015-10-02 NOTE — Progress Notes (Signed)
ANTICOAGULATION CONSULT NOTE - Initial Consult  Pharmacy Consult for Lovenox Indication: VTE prophylaxis  No Known Allergies  Patient Measurements: Height: 5\' 11"  (180.3 cm) Weight: (!) 526 lb (238.592 kg) IBW/kg (Calculated) : 75.3 Heparin Dosing Weight:   Vital Signs: Temp: 98.5 F (36.9 C) (12/16 1436) Temp Source: Oral (12/16 1436) BP: 109/39 mmHg (12/16 1436) Pulse Rate: 117 (12/16 1436)  Labs:  Recent Labs  10/02/15 1139  HGB 9.6*  HCT 30.7*  PLT 373  CREATININE 1.49*    Estimated Creatinine Clearance: 112.7 mL/min (by C-G formula based on Cr of 1.49).   Medical History: Past Medical History  Diagnosis Date  . Asthma   . Hypertension   . Hyperlipemia   . Diabetes mellitus without complication (HCC)   . Hypothyroidism   . Bipolar disorder (HCC)   . Sleep apnea, obstructive   . Depression     Medications:  Scheduled:  . clindamycin  300 mg Oral 4 times per day  . clonazePAM  0.5 mg Oral BID  . docusate sodium  100 mg Oral BID  . enoxaparin (LOVENOX) injection  40 mg Subcutaneous Q12H  . gabapentin  300 mg Oral QID  . heparin subcutaneous  5,000 Units Subcutaneous 3 times per day  . [START ON 10/03/2015] levothyroxine  75 mcg Oral QAC breakfast  . loratadine  10 mg Oral Daily  . magnesium hydroxide  30 mL Oral Daily  . metoprolol tartrate  25 mg Oral BID  . mometasone-formoterol  2 puff Inhalation BID  . montelukast  10 mg Oral Daily  . Oxcarbazepine  600 mg Oral BID  . senna-docusate  1 tablet Oral BID  . sodium chloride  3 mL Intravenous Q12H  . tiotropium  18 mcg Inhalation Daily  . verapamil  120 mg Oral QHS  . ziprasidone  60 mg Oral BID WC    Assessment: CrCl = 112.7 ml/min TBW = 238.6 kg  Goal of Therapy:  DVT prophylaxis   Plan:  Lovenox 40 mg SQ Q24H originally ordered.  Will adjust dose to lovenox 40 mg SQ Q12H based on BMI > 40.   Shimika Ames D 10/02/2015,6:41 PM

## 2015-10-02 NOTE — Consult Note (Signed)
WOC wound consult note Reason for Consult: Bilateral venous insufficiency.  Has HH and wears compression garment to left leg.  Ulcer to left plantar foot, near heel.   Wound type:Neuropathic/pressure/venous etiology Pressure Ulcer POA: Yes Measurement:Left plantar foot 7 cm x 6 cm x 1 cm ulceration.  Dirty in wound bed. Cleansed and remains ruddy red.  Left anterior calf 8 cm x 4.5 cm x 0.1 cm   Drainage (amount, consistency, odor) Moderate serosanguinous.  Musty odor.  Periwound:Erythema and edema to bilateral lower extremities Dressing procedure/placement/frequency:Cleanse bilateral feet and legs with soap and water.  Algisite to wound bed of left foot.  Unnas boot to bilateral lower legs.  Will change Monday and Thursday.  Interdry Ag to left abdominal pannus.  May also use Dermatherapy pillow cases to wick moisture to right pannus, or other areas that aren't acutely inflamed.  Measure and cut length of InterDry Ag+ to fit in skin folds that have skin breakdown Tuck InterDry  Ag+ fabric into skin folds in a single layer, allow for 2 inches of overhang from skin edges to allow for wicking to occur May remove to bathe; dry area thoroughly and then tuck into affected areas again Do not apply any creams or ointments when using InterDry Ag+ DO NOT THROW AWAY FOR 5 DAYS unless soiled with stool DO NOT [facility] product, this will inactivate the silver in the material  New sheet of Interdry Ag+ should be applied after 5 days of use if patient continues to have skin breakdown  Will not follow at this time.  Please re-consult if needed.  Maple HudsonKaren Osiah Haring RN BSN CWON Pager [phone]

## 2015-10-03 LAB — CBC
HEMATOCRIT: 26.4 % — AB (ref 40.0–52.0)
HEMOGLOBIN: 8.3 g/dL — AB (ref 13.0–18.0)
MCH: 27.6 pg (ref 26.0–34.0)
MCHC: 31.6 g/dL — ABNORMAL LOW (ref 32.0–36.0)
MCV: 87.4 fL (ref 80.0–100.0)
Platelets: 240 10*3/uL (ref 150–440)
RBC: 3.02 MIL/uL — AB (ref 4.40–5.90)
RDW: 17.8 % — ABNORMAL HIGH (ref 11.5–14.5)
WBC: 16.6 10*3/uL — ABNORMAL HIGH (ref 3.8–10.6)

## 2015-10-03 LAB — BASIC METABOLIC PANEL
ANION GAP: 12 (ref 5–15)
BUN: 27 mg/dL — ABNORMAL HIGH (ref 6–20)
CALCIUM: 8 mg/dL — AB (ref 8.9–10.3)
CO2: 26 mmol/L (ref 22–32)
Chloride: 102 mmol/L (ref 101–111)
Creatinine, Ser: 1.57 mg/dL — ABNORMAL HIGH (ref 0.61–1.24)
GFR calc non Af Amer: 48 mL/min — ABNORMAL LOW (ref >60)
GFR, EST AFRICAN AMERICAN: 56 mL/min — AB (ref >60)
Glucose, Bld: 113 mg/dL — ABNORMAL HIGH (ref 65–99)
POTASSIUM: 4.2 mmol/L (ref 3.5–5.1)
Sodium: 140 mmol/L (ref 135–145)

## 2015-10-03 LAB — BLOOD CULTURE ID PANEL (REFLEXED)
Acinetobacter baumannii: NOT DETECTED
CANDIDA ALBICANS: NOT DETECTED
CANDIDA GLABRATA: NOT DETECTED
Candida krusei: NOT DETECTED
Candida parapsilosis: NOT DETECTED
Candida tropicalis: NOT DETECTED
Carbapenem resistance: NOT DETECTED
ENTEROBACTER CLOACAE COMPLEX: NOT DETECTED
ENTEROBACTERIACEAE SPECIES: NOT DETECTED
ESCHERICHIA COLI: NOT DETECTED
Enterococcus species: NOT DETECTED
Haemophilus influenzae: NOT DETECTED
Klebsiella oxytoca: NOT DETECTED
Klebsiella pneumoniae: NOT DETECTED
Listeria monocytogenes: NOT DETECTED
Methicillin resistance: NOT DETECTED
NEISSERIA MENINGITIDIS: NOT DETECTED
PSEUDOMONAS AERUGINOSA: NOT DETECTED
Proteus species: NOT DETECTED
STAPHYLOCOCCUS AUREUS BCID: NOT DETECTED
STAPHYLOCOCCUS SPECIES: NOT DETECTED
STREPTOCOCCUS AGALACTIAE: NOT DETECTED
STREPTOCOCCUS PNEUMONIAE: NOT DETECTED
STREPTOCOCCUS SPECIES: DETECTED — AB
Serratia marcescens: NOT DETECTED
Streptococcus pyogenes: NOT DETECTED
Vancomycin resistance: NOT DETECTED

## 2015-10-03 LAB — HEMOGLOBIN A1C: Hgb A1c MFr Bld: 5.9 % (ref 4.0–6.0)

## 2015-10-03 LAB — URINE CULTURE

## 2015-10-03 LAB — TROPONIN I
TROPONIN I: 0.25 ng/mL — AB (ref <0.031)
TROPONIN I: 0.28 ng/mL — AB (ref <0.031)

## 2015-10-03 MED ORDER — VANCOMYCIN HCL 10 G IV SOLR
2000.0000 mg | INTRAVENOUS | Status: DC
  Administered 2015-10-04: 2000 mg via INTRAVENOUS
  Filled 2015-10-03 (×2): qty 2000

## 2015-10-03 MED ORDER — VANCOMYCIN HCL 10 G IV SOLR
2000.0000 mg | Freq: Once | INTRAVENOUS | Status: AC
  Administered 2015-10-03: 2000 mg via INTRAVENOUS
  Filled 2015-10-03: qty 2000

## 2015-10-03 MED ORDER — ENOXAPARIN SODIUM 40 MG/0.4ML ~~LOC~~ SOLN: 40.0000 mg | SUBCUTANEOUS | Status: DC

## 2015-10-03 MED ORDER — IBUPROFEN 400 MG PO TABS
800.0000 mg | ORAL_TABLET | Freq: Once | ORAL | Status: AC
  Administered 2015-10-03: 800 mg via ORAL
  Filled 2015-10-03: qty 2

## 2015-10-03 MED ORDER — CEFTRIAXONE SODIUM 2 G IJ SOLR
2.0000 g | INTRAMUSCULAR | Status: DC
  Administered 2015-10-03 – 2015-10-04 (×2): 2 g via INTRAVENOUS
  Filled 2015-10-03 (×3): qty 2

## 2015-10-03 MED ORDER — ENOXAPARIN SODIUM 150 MG/ML ~~LOC~~ SOLN
240.0000 mg | Freq: Once | SUBCUTANEOUS | Status: AC
  Administered 2015-10-03: 240 mg via SUBCUTANEOUS
  Filled 2015-10-03: qty 1.6

## 2015-10-03 MED ORDER — ENOXAPARIN SODIUM 40 MG/0.4ML ~~LOC~~ SOLN
40.0000 mg | Freq: Two times a day (BID) | SUBCUTANEOUS | Status: DC
  Administered 2015-10-03 – 2015-10-04 (×2): 40 mg via SUBCUTANEOUS
  Filled 2015-10-03 (×2): qty 0.4

## 2015-10-03 NOTE — Care Management Note (Signed)
Case Management Note  Patient Details  Name: Adrian DiegoCarlton B Welke Jr. MRN: [id number] Date of Birth: 07/28/1961  Subjective/Objective:    Discussed possibility of LTAC referral (as suggested by Dr Cherlynn KaiserSainani) with Maple [facility]aren [facility] just to see if Adrian [name] would possibly qualify [facility]. Clydie BraunKaren reports that at this time Adrian [name] does not meed the Medicare criteria for LTAC which has changed within the last year. If Adrian [name] becomes more acute requiring ICU or telemetry monitoring then Clydie BraunKaren or Corine will be happy to reevaluate Adrian [name] for LTAC. Adrian [name] currently resides with his Mother and sister and has extensive wounds/cellulitis, along with numerous other medical issues.                  Action/Plan:   Expected Discharge Date:                  Expected Discharge Plan:     In-House Referral:     Discharge planning Services     Post Acute Care Choice:    Choice offered to:     DME Arranged:    DME Agency:     HH Arranged:    HH Agency:     Status of Service:     Medicare Important Message Given:    Date Medicare IM Given:    Medicare IM give by:    Date Additional Medicare IM Given:    Additional Medicare Important Message give by:     If discussed at Long Length of Stay Meetings, dates discussed:    Additional Comments:  Adrian Bady A, RN 10/03/2015, 12:22 PM

## 2015-10-03 NOTE — Progress Notes (Signed)
Notified Dr Clint GuyHower, MD on-call that heparin and lovenox are both ordered for patient, heparin discontinued. Also notified of new troponin result 0.23, trending up, lovenox dosage adjusted. CPAP also ordered for patient, patient requesting CPAP. Nursing continues to monitor and assist with ADLs.

## 2015-10-03 NOTE — Progress Notes (Signed)
Dr Winona LegatoVaickute notified of critical lab value lactic acid 2.9, this is trending down from previous result. Also notified of troponin 0.16, trending troponins Q4 hours, increased metoprolol to Q6 hours, ordered nitro. Nursing continues to monitor.

## 2015-10-03 NOTE — Progress Notes (Signed)
  Pharmacy Antibiotic Follow-up Note  Adrian DiegoCarlton B Hults Jr. is a 54 y.o. year-old male admitted on 10/02/2015.  The patient is currently on day 1 of Rocephin for Strep. spp..  Assessment/Plan: 12/17 0330 Patient was on clindamycin PO for cellulitis. Biofire returned Strep spp. Therapy changed to Rocephin IV per conversation with Dr. Sheryle Hailiamond.  Temp (24hrs), Avg:100.6 F (38.1 C), Min:97.9 F (36.6 C), Max:103.4 F (39.7 C)   Recent Labs Lab 10/02/15 1139 10/02/15 1841  WBC 30.9* 23.9*    Recent Labs Lab 10/02/15 1139 10/02/15 1841  CREATININE 1.49* 1.61*   Estimated Creatinine Clearance: 104.3 mL/min (by C-G formula based on Cr of 1.61).    No Known Allergies  Antimicrobials this admission: Clindamycin Ceftriaxone  Levels/dose changes this admission:   Microbiology results: Biofire: Strep spp.    Galaxy Borden S PharmD 10/03/2015 4:20 AM

## 2015-10-03 NOTE — Consult Note (Signed)
[facility] Consultation Note  Patient ID: Adrian DiegoCarlton B Sergent Jr., MRN: [id number], DOB/AGE: Aug 04, 1961 54 y.o. Admit date: 10/02/2015   Date of Consult: 10/03/2015 Primary Physician: No PCP Per Patient Primary Cardiologist: None  Chief Complaint:  Chief Complaint  Patient presents with  . Shortness of Breath   Reason for Consult: chronic systolic dysfunction congestive heart failure with acute sepsis and elevated troponin  HPI: 54 y.o. male with known chronic systolic dysfunction congestive heart failure with occasional exacerbation due to anemia diabetes with complications essential hypertension makes hyperlipidemia and sleep apnea with bipolar disorder with acute episode of sepsis. The patient has significant cellulitis of the skin in his upper leg area and has had significant leukocytosis. The patient has had no evidence of symptoms of significant chest discomfort or worsening heart failure although has significant morbidity. The patient has had an elevated troponin more consistent with demand ischemia at this point and current illness. He the patient has been treated for hypertension and heart rate control with metoprolol and verapamil and currently is stable at this time. There has been no evidence of rhythm disturbances requiring further intervention. The patient is currently without evidence of significant heart failure or anginal equivalent or acute coronary syndrome type symptoms  Past Medical History  Diagnosis Date  . Asthma   . Hypertension   . Hyperlipemia   . Diabetes mellitus without complication (HCC)   . Hypothyroidism   . Bipolar disorder (HCC)   . Sleep apnea, obstructive   . Depression       Surgical History:  Past Surgical History  Procedure Laterality Date  . Tonsillectomy    . Bone removed      bone removed in foot due to infection      Home Meds: Prior to Admission medications   Medication Sig Start Date End Date Taking? Authorizing  Provider  Fluticasone-Salmeterol (ADVAIR) 250-50 MCG/DOSE AEPB Inhale 1 puff into the lungs 2 (two) times daily.   Yes Historical Provider, MD  gabapentin (NEURONTIN) 300 MG capsule Take 300 mg by mouth 4 (four) times daily.   Yes Historical Provider, MD  levothyroxine (SYNTHROID, LEVOTHROID) 75 MCG tablet Take 75 mcg by mouth daily before breakfast.   Yes Historical Provider, MD  lisinopril (PRINIVIL,ZESTRIL) 5 MG tablet Take 5 mg by mouth daily.   Yes Historical Provider, MD  loratadine (CLARITIN) 10 MG tablet Take 10 mg by mouth daily.   Yes Historical Provider, MD  metFORMIN (GLUCOPHAGE) 1000 MG tablet Take 1,000 mg by mouth 2 (two) times daily with a meal.   Yes Historical Provider, MD  montelukast (SINGULAIR) 10 MG tablet Take 10 mg by mouth daily.   Yes Historical Provider, MD  nystatin (MYCOSTATIN/NYSTOP) 100000 UNIT/GM POWD Apply topically 3 (three) times daily.   Yes Historical Provider, MD  omeprazole (PRILOSEC) 20 MG capsule Take 20 mg by mouth daily.   Yes Historical Provider, MD  oxcarbazepine (TRILEPTAL) 600 MG tablet Take 600 mg by mouth 2 (two) times daily.   Yes Historical Provider, MD  potassium chloride SA (K-DUR,KLOR-CON) 20 MEQ tablet Take 20 mEq by mouth daily.   Yes Historical Provider, MD  tiotropium (SPIRIVA) 18 MCG inhalation capsule Place 18 mcg into inhaler and inhale daily.   Yes Historical Provider, MD  torsemide (DEMADEX) 20 MG tablet Take 20 mg by mouth daily.   Yes Historical Provider, MD  verapamil (CALAN-SR) 120 MG CR tablet Take 120 mg by mouth at bedtime.   Yes Historical Provider, MD  ziprasidone (GEODON) 60 MG capsule Take 60 mg by mouth 2 (two) times daily with a meal.   Yes Historical Provider, MD  zolpidem (AMBIEN) 10 MG tablet Take one tablet by mouth every night at bedtime as needed for rest 01/17/14  Yes Clydia Llano, MD    Inpatient Medications:  . cefTRIAXone (ROCEPHIN)  IV  2 g Intravenous Q24H  . clonazePAM  0.5 mg Oral BID  . docusate sodium  100  mg Oral BID  . enoxaparin (LOVENOX) injection  1 mg/kg Subcutaneous Q12H  . gabapentin  300 mg Oral QID  . levothyroxine  75 mcg Oral QAC breakfast  . loratadine  10 mg Oral Daily  . magnesium hydroxide  30 mL Oral Daily  . metoprolol tartrate  25 mg Oral Q6H  . mometasone-formoterol  2 puff Inhalation BID  . montelukast  10 mg Oral Daily  . nitroGLYCERIN  0.5 inch Topical 3 times per day  . Oxcarbazepine  600 mg Oral BID  . senna-docusate  1 tablet Oral BID  . sodium chloride  3 mL Intravenous Q12H  . tiotropium  18 mcg Inhalation Daily  . verapamil  120 mg Oral QHS  . ziprasidone  60 mg Oral BID WC      Allergies: No Known Allergies  Social History   Social History  . Marital Status: Single    Spouse Name: N/A  . Number of Children: N/A  . Years of Education: N/A   Occupational History  . Not on file.   Social History Main Topics  . Smoking status: Never Smoker   . Smokeless tobacco: Not on file  . Alcohol Use: No  . Drug Use: No  . Sexual Activity: Not on file   Other Topics Concern  . Not on file   Social History Narrative     Family History  Problem Relation Age of Onset  . Heart failure Mother   . Stroke Father      Review of Systems Positive for weakness fatigue extremity edema Negative for: General:  chills, fever, night sweats or weight changes.  Cardiovascular: PND orthopnea syncope dizziness  Dermatological skin lesions rashes Respiratory: Cough congestion Urologic: Frequent urination urination at night and hematuria Abdominal: negative for nausea, vomiting, diarrhea, bright red blood per rectum, melena, or hematemesis Neurologic: negative for visual changes, and/or hearing changes  All other systems reviewed and are otherwise negative except as noted above.  Labs:  Recent Labs  10/02/15 1841 10/02/15 2027 10/03/15 0014 10/03/15 0403  TROPONINI 0.16* 0.23* 0.25* 0.28*   Lab Results  Component Value Date   WBC 16.6* 10/03/2015    HGB 8.3* 10/03/2015   HCT 26.4* 10/03/2015   MCV 87.4 10/03/2015   PLT 240 10/03/2015    Recent Labs Lab 10/02/15 1139  10/03/15 0403  NA 136  --  140  K 4.7  --  4.2  CL 97*  --  102  CO2 26  --  26  BUN 29*  --  27*  CREATININE 1.49*  < > 1.57*  CALCIUM 8.8*  --  8.0*  PROT 7.9  --   --   BILITOT 0.7  --   --   ALKPHOS 83  --   --   ALT 26  --   --   AST 28  --   --   GLUCOSE 131*  --  113*  < > = values in this interval not displayed. No results found for: CHOL, HDL, LDLCALC, TRIG  No results found for: DDIMER  Radiology/Studies:  Dg Chest Port 1 View  10/02/2015  CLINICAL DATA:  Fever x 2 days, h/o htn, non smoker EXAM: PORTABLE CHEST 1 VIEW COMPARISON:  01/15/2014 FINDINGS: Mildly degraded exam due to AP portable technique and patient body habitus. Both views are also minimally motion degraded. Midline trachea. Moderate cardiomegaly. No right-sided and no definite left pleural effusion. No pneumothorax. Pulmonary interstitial prominence is favored to be technique related; due to low lung volumes. Cannot exclude bibasilar airspace disease. IMPRESSION: Decreased sensitivity and specificity exam due to technique related factors, as described above. Cardiomegaly, without overt congestive failure. Possible bibasilar airspace disease. Electronically Signed   By: Jeronimo Greaves M.D.   On: 10/02/2015 12:30    EKG: Normal sinus rhythm  Weights: Filed Weights   10/02/15 1747  Weight: 526 lb (238.592 kg)     Physical Exam: Blood pressure 136/53, pulse 98, temperature 101.1 F (38.4 C), temperature source Oral, resp. rate 18, height  (1.803 m), weight 526 lb (238.592 kg), SpO2 93 %. Body mass index is 73.39 kg/(m^2). General: Well developed, well nourished, in no acute distress. And morbidly obese Head eyes ears nose throat: Normocephalic, atraumatic, sclera non-icteric, no xanthomas, nares are without discharge. No apparent thyromegaly and/or mass  Lungs: Normal respiratory  effort.  Diffuse wheezes, no rales, some rhonchi.  Heart: RRR with normal S1 S2. no murmur gallop, no rub, PMI is diffuse placement, carotid upstroke normal without bruit, jugular venous pressure is normal Abdomen: Soft, non-tender,  distended with normoactive bowel sounds. Cannot assess due to increased abdominal girth  Extremities: 1+ edema. no cyanosis, no clubbing, positive ulcers  Peripheral : 2+ bilateral upper extremity pulses, 0 + bilateral femoral pulses, 0 + bilateral dorsal pedal pulse Neuro: Alert and oriented. No facial asymmetry. No focal deficit. Moves all extremities spontaneously. Musculoskeletal: Normal muscle tone without kyphosis Psych:  Responds to questions appropriately with a normal affect.    Assessment: 54 year old male with sleep apnea bipolar disorder mixed hyperlipidemia essential hypertension diabetes with complication anemia and chronic systolic dysfunction heart failure with acute sepsis and cellulitis with significant illness likely causing elevated troponin without evidence of acute coronary syndrome or heart failure exacerbation at this time  Plan: 1. Continue treatment of cellulitis and infection and sepsis 2. No further intervention of elevated troponin most consistent with demand ischemia rather than acute coronary syndrome 3. Continue treatment of hypertension and heart failure with possible LV systolic dysfunction with a beta blocker 4. Use of diuretics as necessary for lower extremity edema as needed 5. Ambulation if able and follow for any further significant symptoms requiring further intervention 6. Possible echocardiogram for further evaluation of LV systolic dysfunction contributing to above Signed, Lamar Blinks M.D. [facility] 10/03/2015, 8:58 AM

## 2015-10-03 NOTE — Progress Notes (Signed)
ANTIBIOTIC CONSULT NOTE - INITIAL  Pharmacy Consult for vancomycin Indication: cellulitis  No Known Allergies  Patient Measurements: Height: 5\' 11"  (180.3 cm) Weight: (!) 526 lb (238.592 kg) IBW/kg (Calculated) : 75.3 Adjusted Body Weight: 141 kg  Vital Signs: Temp: 100.9 F (38.3 C) (12/17 1124) Temp Source: Oral (12/17 1124) BP: 121/45 mmHg (12/17 1124) Pulse Rate: 102 (12/17 1124) Intake/Output from previous day: 12/16 0701 - 12/17 0700 In: 50 [IV Piggyback:50] Out: 2480 [Urine:2480] Intake/Output from this shift: Total I/O In: -  Out: 1525 [Urine:1525]  Labs:  Recent Labs  10/02/15 1139 10/02/15 1841 10/03/15 0403  WBC 30.9* 23.9* 16.6*  HGB 9.6* 8.2* 8.3*  PLT 373 266 240  CREATININE 1.49* 1.61* 1.57*   Estimated Creatinine Clearance: 107 mL/min (by C-G formula based on Cr of 1.57). No results for input(s): VANCOTROUGH, VANCOPEAK, VANCORANDOM, GENTTROUGH, GENTPEAK, GENTRANDOM, TOBRATROUGH, TOBRAPEAK, TOBRARND, AMIKACINPEAK, AMIKACINTROU, AMIKACIN in the last 72 hours.   Microbiology: Recent Results (from the past 720 hour(s))  Urine culture     Status: None   Collection Time: 10/02/15 11:36 AM  Result Value Ref Range Status   Specimen Description URINE, RANDOM  Final   Special Requests NONE  Final   Culture MULTIPLE SPECIES PRESENT, SUGGEST RECOLLECTION  Final   Report Status 10/03/2015 FINAL  Final  Blood culture (routine x 2)     Status: None (Preliminary result)   Collection Time: 10/02/15 11:36 AM  Result Value Ref Range Status   Specimen Description BLOOD RIGHT CHEST  Final   Special Requests BOTTLES DRAWN AEROBIC AND ANAEROBIC  1CC  Final   Culture  Setup Time   Final    GRAM POSITIVE COCCI IN BOTH AEROBIC AND ANAEROBIC BOTTLES Organism ID to follow CRITICAL RESULT CALLED TO, READ BACK BY AND VERIFIED WITH: MATT MCBANE AT 0315 ON 10/03/15 [facility] CONFIRMED BY [facility]    Culture   Final    GRAM POSITIVE COCCI IN BOTH AEROBIC AND ANAEROBIC BOTTLES     Report Status PENDING  Incomplete  Blood Culture ID Panel (Reflexed)     Status: Abnormal   Collection Time: 10/02/15 11:36 AM  Result Value Ref Range Status   Enterococcus species NOT DETECTED NOT DETECTED Final   Listeria monocytogenes NOT DETECTED NOT DETECTED Final   Staphylococcus species NOT DETECTED NOT DETECTED Final   Staphylococcus aureus NOT DETECTED NOT DETECTED Final   Streptococcus species DETECTED (A) NOT DETECTED Final    Comment: CRITICAL RESULT CALLED AND READ BACK BY MATT MCBANE AT 0315 ON 10/03/15 RWW    Streptococcus agalactiae NOT DETECTED NOT DETECTED Final   Streptococcus pneumoniae NOT DETECTED NOT DETECTED Final   Streptococcus pyogenes NOT DETECTED NOT DETECTED Final   Acinetobacter baumannii NOT DETECTED NOT DETECTED Final   Enterobacteriaceae species NOT DETECTED NOT DETECTED Final   Enterobacter cloacae complex NOT DETECTED NOT DETECTED Final   Escherichia coli NOT DETECTED NOT DETECTED Final   Klebsiella oxytoca NOT DETECTED NOT DETECTED Final   Klebsiella pneumoniae NOT DETECTED NOT DETECTED Final   Proteus species NOT DETECTED NOT DETECTED Final   Serratia marcescens NOT DETECTED NOT DETECTED Final   Haemophilus influenzae NOT DETECTED NOT DETECTED Final   Neisseria meningitidis NOT DETECTED NOT DETECTED Final   Pseudomonas aeruginosa NOT DETECTED NOT DETECTED Final   Candida albicans NOT DETECTED NOT DETECTED Final   Candida glabrata NOT DETECTED NOT DETECTED Final   Candida krusei NOT DETECTED NOT DETECTED Final   Candida parapsilosis NOT DETECTED NOT DETECTED Final  Candida tropicalis NOT DETECTED NOT DETECTED Final   Carbapenem resistance NOT DETECTED NOT DETECTED Final   Methicillin resistance NOT DETECTED NOT DETECTED Final   Vancomycin resistance NOT DETECTED NOT DETECTED Final  Blood culture (routine x 2)     Status: None (Preliminary result)   Collection Time: 10/02/15 11:40 AM  Result Value Ref Range Status   Specimen Description BLOOD  RIGHT AC  Final   Special Requests BOTTLES DRAWN AEROBIC AND ANAEROBIC  1CC  Final   Culture  Setup Time   Final    GRAM POSITIVE COCCI AEROBIC BOTTLE ONLY CRITICAL RESULT CALLED TO, READ BACK BY AND VERIFIED WITH: MATT MCBANE AT 0315 ON 10/03/15 RWW CONFIRMED BY [facility]    Culture GRAM POSITIVE COCCI AEROBIC BOTTLE ONLY   Final   Report Status PENDING  Incomplete    Assessment: 54 yo male with LE cellulitis on ceftriaxone adding vancomycin; pt febrile to 101 F. Wcx pending  Goal of Therapy:  Vancomycin trough 15-20 mcg/ml   Plan:  Will order vancomycin 2000 mg IV x1 then 2000 mg IV q18h 12 h later for stacked dosing  Will order trough for 12/20 at 1030 Will order SCr for AM to monitor renal function, pt at risk of accumulating doses Will need to continue for pt improvement and culture data to narrow abx  Crist Fat L 10/03/2015,3:54 PM

## 2015-10-03 NOTE — Progress Notes (Signed)
Called by nursing regarding elevated troponin. Likely secondary to demand ischemia as well as renal retention but upper trend and chest pain last night (12/15) obligates evaluation. Cardiology consult placed

## 2015-10-03 NOTE — Progress Notes (Signed)
[facility] [place] NAME: Adrian [name]    MR#:  [id number]  DATE OF BIRTH:  01/02/1961  SUBJECTIVE:  CHIEF COMPLAINT:   Chief Complaint  Patient presents with  . Shortness of Breath   Patient admitted to the hospital secondary to shortness of breath but also noted to be sepsis likely secondary to cellulitis. White cell count improved. Fever curve has improved.  REVIEW OF SYSTEMS:    Review of Systems  Constitutional: Negative for fever and chills.  HENT: Negative for congestion and tinnitus.   Eyes: Negative for blurred vision and double vision.  Respiratory: Negative for cough, shortness of breath and wheezing.   Cardiovascular: Negative for chest pain, orthopnea and PND.  Gastrointestinal: Negative for nausea, vomiting, abdominal pain and diarrhea.  Genitourinary: Negative for dysuria and hematuria.  Neurological: Positive for weakness (generalized). Negative for dizziness, sensory change and focal weakness.  All other systems reviewed and are negative.   Nutrition: Heart healthy Tolerating Diet: Yes Tolerating PT: Await Eval.    DRUG ALLERGIES:  No Known Allergies  VITALS:  Blood pressure 121/45, pulse 102, temperature 100.9 F (38.3 C), temperature source Oral, resp. rate 18, height '5\' 11"'  (1.803 m), weight 238.592 kg (526 lb), SpO2 95 %.  PHYSICAL EXAMINATION:   Physical Exam  GENERAL:  54 y.o.-year-old morbidly obese patient lying in the bed in no acute distress.  EYES: Pupils equal, round, reactive to light and accommodation. No scleral icterus. Extraocular muscles intact.  HEENT: Head atraumatic, normocephalic. Oropharynx and nasopharynx clear.  NECK:  Supple, no jugular venous distention. No thyroid enlargement, no tenderness.  LUNGS: Normal breath sounds bilaterally, no wheezing, rales, rhonchi. No use of accessory muscles of respiration. Difficult to assess due to morbid obesity.  CARDIOVASCULAR: S1, S2  normal. No murmurs, rubs, or gallops.  ABDOMEN: Soft, nontender, nondistended. Bowel sounds present. No organomegaly or mass.  EXTREMITIES: No cyanosis, clubbing, + 1-2 edema b/l.  + atrophy of muscles on LE b/l.   NEUROLOGIC: Cranial nerves II through XII are intact. No focal Motor or sensory deficits b/l.  Globally weak & bedbound PSYCHIATRIC: The patient is alert and oriented x 3. Good affect.  SKIN: No obvious rash, lesion, or ulcer.    LABORATORY PANEL:   CBC  Recent Labs Lab 10/03/15 0403  WBC 16.6*  HGB 8.3*  HCT 26.4*  PLT 240   ------------------------------------------------------------------------------------------------------------------  Chemistries   Recent Labs Lab 10/02/15 1139  10/03/15 0403  NA 136  --  140  K 4.7  --  4.2  CL 97*  --  102  CO2 26  --  26  GLUCOSE 131*  --  113*  BUN 29*  --  27*  CREATININE 1.49*  < > 1.57*  CALCIUM 8.8*  --  8.0*  AST 28  --   --   ALT 26  --   --   ALKPHOS 83  --   --   BILITOT 0.7  --   --   < > = values in this interval not displayed. ------------------------------------------------------------------------------------------------------------------  Cardiac Enzymes  Recent Labs Lab 10/03/15 0403  TROPONINI 0.28*   ------------------------------------------------------------------------------------------------------------------  RADIOLOGY:  Dg Chest Port 1 View  10/02/2015  CLINICAL DATA:  Fever x 2 days, h/o htn, non smoker EXAM: PORTABLE CHEST 1 VIEW COMPARISON:  01/15/2014 FINDINGS: Mildly degraded exam due to AP portable technique and patient body habitus. Both views are also minimally motion degraded. Midline trachea. Moderate cardiomegaly.  No right-sided and no definite left pleural effusion. No pneumothorax. Pulmonary interstitial prominence is favored to be technique related; due to low lung volumes. Cannot exclude bibasilar airspace disease. IMPRESSION: Decreased sensitivity and specificity exam due  to technique related factors, as described above. Cardiomegaly, without overt congestive failure. Possible bibasilar airspace disease. Electronically Signed   By: Abigail Miyamoto M.D.   On: 10/02/2015 12:30     ASSESSMENT AND PLAN:   54 year old male with past medical history of morbid obesity, started sleep apnea, diabetes, hypertension, hyperlipidemia, bipolar bipolar disorder, hypothyroidism, presented to the hospital due to shortness of breath but also noted to be septic.  #1 sepsis-patient met criteria given his leukocytosis tachycardia and lower extremity cellulitis. -This is is likely the lower extremity cellulitis. Continue IV ceftriaxone, BC + gram + cocci and will add IV Vancomycin and follow sensitivities.  - follow fever curve.  - cont. Local wound care to LE b/l.   #2 lower extremity cellulitis-the cause of patient's sepsis. -Continue ceftriaxone but will add IV vancomycin. Follow wound/blood cultures. -I will get a wound care consult for local wound care.  #3 elevated troponin-likely the setting of demand ischemia from sepsis and underlying diastolic CHF. -We'll DC final dose Lovenox. Continue beta blocker, verapamil -Appreciate cardiology input and no further acute intervention. Will check 2-D Echo.   #4 Hypothyroidism - cont. Synthroid.   #5 Hypertension - cont. Verapamil, Metoprolol.   #6 hx of Bipolar Disorder - cont. Trileptal, Geodon.   #7.  OSA - cont. CPAP.    #8 COPD - cont. Spiriva.   - no acute exacerbation.   Pt. Likely would benefit from a LTAC placement and have made Social Work aware.     All the records are reviewed and case discussed with Care Management/Social Workerr. Management plans discussed with the patient, family and they are in agreement.  CODE STATUS: Full Code  DVT Prophylaxis: Lovenox  TOTAL TIME TAKING CARE OF THIS PATIENT: 30 minutes.   POSSIBLE D/C IN 2-3 DAYS, DEPENDING ON CLINICAL CONDITION.   Henreitta Leber M.D on  10/03/2015 at 3:14 PM  Between 7am to 6pm - Pager - [phone]  After 6pm go to [email or web address] - password [email or web address] [facility]  [place] [facility]  Office  [phone]  CC: Primary care physician; No PCP Per Patient

## 2015-10-03 NOTE — Progress Notes (Signed)
Anticoagulation monitoring  54 yo male ordered enoxaparin 40 mg Sq q24h.  CrCl 107 ml/min BMI >40 Plt 240  Based on CrCl >30 ml/min and BMI >40, will transition to enoxaparin 40 mg SQ q12h.   Pharmacy will continue to monitor.   Crist FatHannah Sura Canul, PharmD, BCPS Clinical Pharmacist 10/03/2015 3:34 PM

## 2015-10-04 ENCOUNTER — Inpatient Hospital Stay (HOSPITAL_COMMUNITY): Payer: Medicare Other

## 2015-10-04 ENCOUNTER — Inpatient Hospital Stay: Payer: Medicare Other

## 2015-10-04 ENCOUNTER — Inpatient Hospital Stay
Admit: 2015-10-04 | Discharge: 2015-10-04 | Disposition: A | Discharge status: Home / Self Care | Payer: Medicare Other | Attending: Specialist | Admitting: Specialist

## 2015-10-04 ENCOUNTER — Inpatient Hospital Stay (HOSPITAL_COMMUNITY)
Admission: AD | Admit: 2015-10-04 | Discharge: 2015-10-20 | DRG: 004 | Disposition: A | Discharge status: Other Acute Inpatient Hospital | Payer: Medicare Other | Source: Other Acute Inpatient Hospital | Attending: Internal Medicine | Admitting: Internal Medicine

## 2015-10-04 DIAGNOSIS — J9600 Acute respiratory failure, unspecified whether with hypoxia or hypercapnia: Secondary | ICD-10-CM | POA: Diagnosis not present

## 2015-10-04 DIAGNOSIS — E662 Morbid (severe) obesity with alveolar hypoventilation: Secondary | ICD-10-CM | POA: Diagnosis present

## 2015-10-04 DIAGNOSIS — L03311 Cellulitis of abdominal wall: Secondary | ICD-10-CM | POA: Diagnosis present

## 2015-10-04 DIAGNOSIS — R6521 Severe sepsis with septic shock: Secondary | ICD-10-CM | POA: Diagnosis present

## 2015-10-04 DIAGNOSIS — F319 Bipolar disorder, unspecified: Secondary | ICD-10-CM | POA: Diagnosis present

## 2015-10-04 DIAGNOSIS — E876 Hypokalemia: Secondary | ICD-10-CM | POA: Diagnosis not present

## 2015-10-04 DIAGNOSIS — E119 Type 2 diabetes mellitus without complications: Secondary | ICD-10-CM | POA: Diagnosis present

## 2015-10-04 DIAGNOSIS — R1312 Dysphagia, oropharyngeal phase: Secondary | ICD-10-CM | POA: Diagnosis present

## 2015-10-04 DIAGNOSIS — I10 Essential (primary) hypertension: Secondary | ICD-10-CM | POA: Diagnosis present

## 2015-10-04 DIAGNOSIS — E870 Hyperosmolality and hypernatremia: Secondary | ICD-10-CM | POA: Diagnosis present

## 2015-10-04 DIAGNOSIS — B371 Pulmonary candidiasis: Secondary | ICD-10-CM | POA: Diagnosis present

## 2015-10-04 DIAGNOSIS — A419 Sepsis, unspecified organism: Secondary | ICD-10-CM | POA: Diagnosis present

## 2015-10-04 DIAGNOSIS — B954 Other streptococcus as the cause of diseases classified elsewhere: Secondary | ICD-10-CM | POA: Diagnosis present

## 2015-10-04 DIAGNOSIS — J9621 Acute and chronic respiratory failure with hypoxia: Secondary | ICD-10-CM | POA: Diagnosis not present

## 2015-10-04 DIAGNOSIS — R6520 Severe sepsis without septic shock: Secondary | ICD-10-CM | POA: Diagnosis not present

## 2015-10-04 DIAGNOSIS — Z22322 Carrier or suspected carrier of Methicillin resistant Staphylococcus aureus: Secondary | ICD-10-CM

## 2015-10-04 DIAGNOSIS — D638 Anemia in other chronic diseases classified elsewhere: Secondary | ICD-10-CM | POA: Diagnosis present

## 2015-10-04 DIAGNOSIS — Z6841 Body Mass Index (BMI) 40.0 and over, adult: Secondary | ICD-10-CM | POA: Diagnosis not present

## 2015-10-04 DIAGNOSIS — Z0189 Encounter for other specified special examinations: Secondary | ICD-10-CM | POA: Diagnosis not present

## 2015-10-04 DIAGNOSIS — R7881 Bacteremia: Secondary | ICD-10-CM | POA: Diagnosis not present

## 2015-10-04 DIAGNOSIS — N179 Acute kidney failure, unspecified: Secondary | ICD-10-CM | POA: Diagnosis present

## 2015-10-04 DIAGNOSIS — G4733 Obstructive sleep apnea (adult) (pediatric): Secondary | ICD-10-CM | POA: Diagnosis present

## 2015-10-04 DIAGNOSIS — I4891 Unspecified atrial fibrillation: Secondary | ICD-10-CM | POA: Diagnosis present

## 2015-10-04 DIAGNOSIS — J810 Acute pulmonary edema: Secondary | ICD-10-CM | POA: Diagnosis not present

## 2015-10-04 DIAGNOSIS — L97211 Non-pressure chronic ulcer of right calf limited to breakdown of skin: Secondary | ICD-10-CM | POA: Diagnosis present

## 2015-10-04 DIAGNOSIS — B955 Unspecified streptococcus as the cause of diseases classified elsewhere: Secondary | ICD-10-CM | POA: Diagnosis present

## 2015-10-04 DIAGNOSIS — Z9289 Personal history of other medical treatment: Secondary | ICD-10-CM | POA: Diagnosis not present

## 2015-10-04 DIAGNOSIS — N170 Acute kidney failure with tubular necrosis: Secondary | ICD-10-CM | POA: Diagnosis not present

## 2015-10-04 DIAGNOSIS — E039 Hypothyroidism, unspecified: Secondary | ICD-10-CM | POA: Diagnosis present

## 2015-10-04 DIAGNOSIS — J80 Acute respiratory distress syndrome: Secondary | ICD-10-CM | POA: Diagnosis not present

## 2015-10-04 DIAGNOSIS — J45909 Unspecified asthma, uncomplicated: Secondary | ICD-10-CM | POA: Diagnosis present

## 2015-10-04 DIAGNOSIS — L03818 Cellulitis of other sites: Secondary | ICD-10-CM | POA: Diagnosis not present

## 2015-10-04 DIAGNOSIS — J9601 Acute respiratory failure with hypoxia: Secondary | ICD-10-CM | POA: Diagnosis present

## 2015-10-04 DIAGNOSIS — J9690 Respiratory failure, unspecified, unspecified whether with hypoxia or hypercapnia: Secondary | ICD-10-CM | POA: Diagnosis present

## 2015-10-04 DIAGNOSIS — Z79899 Other long term (current) drug therapy: Secondary | ICD-10-CM | POA: Diagnosis not present

## 2015-10-04 DIAGNOSIS — R1310 Dysphagia, unspecified: Secondary | ICD-10-CM | POA: Diagnosis present

## 2015-10-04 DIAGNOSIS — E781 Pure hyperglyceridemia: Secondary | ICD-10-CM | POA: Diagnosis present

## 2015-10-04 DIAGNOSIS — Z430 Encounter for attention to tracheostomy: Secondary | ICD-10-CM | POA: Diagnosis not present

## 2015-10-04 DIAGNOSIS — A047 Enterocolitis due to Clostridium difficile: Secondary | ICD-10-CM | POA: Diagnosis not present

## 2015-10-04 DIAGNOSIS — L97421 Non-pressure chronic ulcer of left heel and midfoot limited to breakdown of skin: Secondary | ICD-10-CM | POA: Diagnosis present

## 2015-10-04 DIAGNOSIS — L0291 Cutaneous abscess, unspecified: Secondary | ICD-10-CM

## 2015-10-04 DIAGNOSIS — E038 Other specified hypothyroidism: Secondary | ICD-10-CM | POA: Diagnosis present

## 2015-10-04 DIAGNOSIS — L03116 Cellulitis of left lower limb: Secondary | ICD-10-CM | POA: Diagnosis present

## 2015-10-04 DIAGNOSIS — R0602 Shortness of breath: Secondary | ICD-10-CM | POA: Diagnosis present

## 2015-10-04 DIAGNOSIS — A0472 Enterocolitis due to Clostridium difficile, not specified as recurrent: Secondary | ICD-10-CM | POA: Diagnosis present

## 2015-10-04 DIAGNOSIS — I288 Other diseases of pulmonary vessels: Secondary | ICD-10-CM | POA: Diagnosis not present

## 2015-10-04 DIAGNOSIS — G9340 Encephalopathy, unspecified: Secondary | ICD-10-CM | POA: Diagnosis not present

## 2015-10-04 LAB — POCT I-STAT 3, ART BLOOD GAS (G3+)
ACID-BASE EXCESS: 4 mmol/L — AB (ref 0.0–2.0)
Bicarbonate: 29.3 mEq/L — ABNORMAL HIGH (ref 20.0–24.0)
O2 SAT: 99 %
PO2 ART: 136 mmHg — AB (ref 80.0–100.0)
Patient temperature: 100.2
TCO2: 31 mmol/L (ref 0–100)
pCO2 arterial: 47.9 mmHg — ABNORMAL HIGH (ref 35.0–45.0)
pH, Arterial: 7.399 (ref 7.350–7.450)

## 2015-10-04 LAB — BLOOD GAS, ARTERIAL
Acid-Base Excess: 6.3 mmol/L — ABNORMAL HIGH (ref 0.0–3.0)
Allens test (pass/fail): POSITIVE — AB
BICARBONATE: 30.6 meq/L — AB (ref 21.0–28.0)
FIO2: 0.44
O2 SAT: 90.2 %
PATIENT TEMPERATURE: 37
PCO2 ART: 42 mmHg (ref 32.0–48.0)
PO2 ART: 55 mmHg — AB (ref 83.0–108.0)
pH, Arterial: 7.47 — ABNORMAL HIGH (ref 7.350–7.450)

## 2015-10-04 LAB — CREATININE, SERUM
CREATININE: 1.88 mg/dL — AB (ref 0.61–1.24)
GFR calc non Af Amer: 39 mL/min — ABNORMAL LOW (ref >60)
GFR, EST AFRICAN AMERICAN: 45 mL/min — AB (ref >60)

## 2015-10-04 MED ORDER — IPRATROPIUM-ALBUTEROL 0.5-2.5 (3) MG/3ML IN SOLN: 3.0000 mL | Freq: Four times a day (QID) | RESPIRATORY_TRACT | Status: DC

## 2015-10-04 MED ORDER — ACETAMINOPHEN 325 MG PO TABS
650.0000 mg | ORAL_TABLET | ORAL | Status: DC | PRN
  Administered 2015-10-08 – 2015-10-14 (×2): 650 mg via ORAL
  Filled 2015-10-04 (×2): qty 2

## 2015-10-04 MED ORDER — FENTANYL CITRATE (PF) 100 MCG/2ML IJ SOLN
100.0000 ug | Freq: Once | INTRAMUSCULAR | Status: AC
  Administered 2015-10-04: 100 ug via INTRAVENOUS

## 2015-10-04 MED ORDER — VANCOMYCIN HCL 10 G IV SOLR: 2000.0000 mg | INTRAVENOUS | Status: DC

## 2015-10-04 MED ORDER — CLINDAMYCIN PHOSPHATE 600 MG/50ML IV SOLN
600.0000 mg | Freq: Four times a day (QID) | INTRAVENOUS | Status: DC
  Administered 2015-10-05 (×2): 600 mg via INTRAVENOUS
  Filled 2015-10-04 (×2): qty 50

## 2015-10-04 MED ORDER — VANCOMYCIN HCL 10 G IV SOLR
1500.0000 mg | Freq: Two times a day (BID) | INTRAVENOUS | Status: DC
  Administered 2015-10-05 – 2015-10-06 (×4): 1500 mg via INTRAVENOUS
  Filled 2015-10-04 (×5): qty 1500

## 2015-10-04 MED ORDER — FENTANYL CITRATE (PF) 100 MCG/2ML IJ SOLN
100.0000 ug | INTRAMUSCULAR | Status: AC | PRN
  Administered 2015-10-05 – 2015-10-06 (×2): 100 ug via INTRAVENOUS
  Administered 2015-10-11: 50 ug via INTRAVENOUS
  Filled 2015-10-04 (×3): qty 2

## 2015-10-04 MED ORDER — FUROSEMIDE 10 MG/ML IJ SOLN
INTRAMUSCULAR | Status: AC
  Filled 2015-10-04: qty 4

## 2015-10-04 MED ORDER — HEPARIN SODIUM (PORCINE) 5000 UNIT/ML IJ SOLN: 5000.0000 [IU] | Freq: Three times a day (TID) | INTRAMUSCULAR | Status: DC

## 2015-10-04 MED ORDER — ANTISEPTIC ORAL RINSE SOLUTION (CORINZ)
7.0000 mL | OROMUCOSAL | Status: DC
  Administered 2015-10-05 – 2015-10-17 (×116): 7 mL via OROMUCOSAL

## 2015-10-04 MED ORDER — MIDAZOLAM HCL 2 MG/2ML IJ SOLN
2.0000 mg | INTRAMUSCULAR | Status: DC | PRN
  Administered 2015-10-06: 2 mg via INTRAVENOUS
  Filled 2015-10-04: qty 2

## 2015-10-04 MED ORDER — ROCURONIUM BROMIDE 50 MG/5ML IV SOLN
0.6000 mg/kg | Freq: Once | INTRAVENOUS | Status: AC
  Administered 2015-10-04: 140 mg via INTRAVENOUS
  Filled 2015-10-04: qty 13.99

## 2015-10-04 MED ORDER — CEFTRIAXONE SODIUM 2 G IJ SOLR
2.0000 g | INTRAMUSCULAR | Status: DC
  Administered 2015-10-05: 2 g via INTRAVENOUS
  Filled 2015-10-04: qty 2

## 2015-10-04 MED ORDER — FUROSEMIDE 10 MG/ML IJ SOLN
40.0000 mg | Freq: Once | INTRAMUSCULAR | Status: AC
  Administered 2015-10-04: 40 mg via INTRAVENOUS

## 2015-10-04 MED ORDER — SODIUM CHLORIDE 0.9 % IV SOLN
INTRAVENOUS | Status: DC
  Administered 2015-10-04 – 2015-10-09 (×2): via INTRAVENOUS

## 2015-10-04 MED ORDER — DEXTROSE 5 % IV SOLN: 2.0000 g | INTRAVENOUS | Status: DC

## 2015-10-04 MED ORDER — ETOMIDATE 2 MG/ML IV SOLN
70.0000 mg | Freq: Once | INTRAVENOUS | Status: AC
  Administered 2015-10-04: 70 mg via INTRAVENOUS

## 2015-10-04 MED ORDER — METHYLPREDNISOLONE SODIUM SUCC 125 MG IJ SOLR
INTRAMUSCULAR | Status: AC
  Filled 2015-10-04: qty 2

## 2015-10-04 MED ORDER — MIDAZOLAM HCL 2 MG/2ML IJ SOLN
INTRAMUSCULAR | Status: AC
  Filled 2015-10-04: qty 4

## 2015-10-04 MED ORDER — METHYLPREDNISOLONE SODIUM SUCC 125 MG IJ SOLR
125.0000 mg | Freq: Once | INTRAMUSCULAR | Status: AC
  Administered 2015-10-04: 125 mg via INTRAVENOUS

## 2015-10-04 MED ORDER — PANTOPRAZOLE SODIUM 40 MG IV SOLR: 40.0000 mg | Freq: Every day | INTRAVENOUS | Status: DC

## 2015-10-04 MED ORDER — PROPOFOL BOLUS VIA INFUSION
30.0000 mg | Freq: Once | INTRAVENOUS | Status: AC
  Administered 2015-10-04: 30 mg via INTRAVENOUS

## 2015-10-04 MED ORDER — FENTANYL CITRATE (PF) 100 MCG/2ML IJ SOLN
INTRAMUSCULAR | Status: AC
  Filled 2015-10-04: qty 4

## 2015-10-04 MED ORDER — ENOXAPARIN SODIUM 40 MG/0.4ML ~~LOC~~ SOLN: 40.0000 mg | Freq: Two times a day (BID) | SUBCUTANEOUS | Status: DC

## 2015-10-04 MED ORDER — SODIUM CHLORIDE 0.9 % IV SOLN: INTRAVENOUS | Status: DC

## 2015-10-04 MED ORDER — PROPOFOL 1000 MG/100ML IV EMUL
0.0000 ug/kg/min | INTRAVENOUS | Status: DC
  Administered 2015-10-04: 20 ug/kg/min via INTRAVENOUS
  Administered 2015-10-05 (×2): 30 ug/kg/min via INTRAVENOUS
  Administered 2015-10-05: 25 ug/kg/min via INTRAVENOUS
  Administered 2015-10-05: 40 ug/kg/min via INTRAVENOUS
  Administered 2015-10-05 (×4): 30 ug/kg/min via INTRAVENOUS
  Administered 2015-10-05: 20 ug/kg/min via INTRAVENOUS
  Administered 2015-10-06: 40 ug/kg/min via INTRAVENOUS
  Administered 2015-10-06: 50 ug/kg/min via INTRAVENOUS
  Administered 2015-10-06: 40 ug/kg/min via INTRAVENOUS
  Administered 2015-10-06: 50 ug/kg/min via INTRAVENOUS
  Administered 2015-10-06 (×2): 40 ug/kg/min via INTRAVENOUS
  Filled 2015-10-04 (×12): qty 100
  Filled 2015-10-04 (×2): qty 200

## 2015-10-04 MED ORDER — FENTANYL CITRATE (PF) 100 MCG/2ML IJ SOLN
100.0000 ug | INTRAMUSCULAR | Status: DC | PRN
  Administered 2015-10-11 – 2015-10-12 (×3): 50 ug via INTRAVENOUS

## 2015-10-04 MED ORDER — CHLORHEXIDINE GLUCONATE 0.12% ORAL RINSE (MEDLINE KIT)
15.0000 mL | Freq: Two times a day (BID) | OROMUCOSAL | Status: DC
  Administered 2015-10-04 – 2015-10-20 (×30): 15 mL via OROMUCOSAL

## 2015-10-04 MED ORDER — SODIUM CHLORIDE 0.9 % IV SOLN: INTRAVENOUS | Status: DC | PRN

## 2015-10-04 MED ORDER — MIDAZOLAM HCL 2 MG/2ML IJ SOLN
4.0000 mg | Freq: Once | INTRAMUSCULAR | Status: AC
  Administered 2015-10-04: 4 mg via INTRAVENOUS

## 2015-10-04 MED ORDER — PROPOFOL 1000 MG/100ML IV EMUL
INTRAVENOUS | Status: AC
  Filled 2015-10-04: qty 100

## 2015-10-04 NOTE — Clinical Social Work Note (Signed)
Clinical Social Work Assessment  Patient Details  Name: Adrian [name]. MRN: [id number] Date of Birth: [birth year]  Date of referral:  10/04/15               Reason for consult:  Housing Concerns/Homelessness, Other (Comment Required)                Permission sought to share information with:  Case Manager Permission granted to share information::  Yes, Release of Information Signed  Name::     Care Manager  Agency::  [facility]  Relationship::  none  Contact Information:  Mom and sister  Housing/Transportation Living arrangements for the past 2 months:  [place] of Information:  Patient Patient Interpreter Needed:  None Criminal Activity/Legal Involvement Pertinent to Current Situation/Hospitalization:  No - Comment as needed Significant Relationships:  Parents, Other Family Members Lives with:  Relatives, Parents Do you feel safe going back to the place where you live?  Yes Need for family participation in patient care:  Yes (Comment)  Care giving concerns:  Mother and sister are aging and du e to his larger size his physical needs may not be met in addition to his chronic health care issues.  Social Worker assessment / plan:  LTAC, coordinate with care Freight forwarder and consult with team lead to ensure pt will have suitable care that will meet his physical and emotional needs  Employment status:  Disabled (Comment on whether or not currently receiving Disability) Insurance information:  Medicaid In [place] PT Recommendations:  LTAC, [place] / Referral to community resources:  Support Groups, Acute Rehab  Patient/Family's Response to care: Patient sister was nodding when LCSW asked if his family can take proper care of him at home- she was nodding no and patient was saying yes and that his mother wouldn't like him to go to LTCF. Patient did agree if a LTCF was needed he would go.  Patient/Family's Understanding of and Emotional  Response to Diagnosis, Current Treatment, and Prognosis:  Patient reported he is not an emotional eater that the psychiatric medications caused all this weight gain  Emotional Assessment Appearance:  Other (Comment Required, Appears stated age, Malodorous (Mobidely obese) Attitude/Demeanor/Rapport:    Affect (typically observed):  Adaptable, Frustrated, Accepting, Appropriate Orientation:  Oriented to Self, Oriented to Place, Oriented to  Time Alcohol / Substance use:  Never Used Psych involvement (Current and /or in the community):   (He was seen in the community for 35 years now the doctor who comes in home prescribes his medicine)  Discharge Needs  Concerns to be addressed:  Care Coordination, Home Safety Concerns, Other (Comment Required (He has an aging mother and sister his physical needs may not be met due to his size and imobility) Readmission within the last 30 days:  No Current discharge risk:  Dependent with Mobility, Other, Chronically ill Barriers to Discharge:   (LTAC monitering u nable to be accepted due to his size (526 lbs))   Joana Reamer, LCSW 10/04/2015, 3:36 PM

## 2015-10-04 NOTE — H&P (Signed)
PULMONARY / CRITICAL CARE MEDICINE   Name: Adrian [name]. MRN: [id number] DOB: [birth year]    ADMISSION DATE:  10/02/2015   REFERRING MD:  [facility]  CHIEF COMPLAINT: SOB  HISTORY OF PRESENT ILLNESS:   54 yo who presented  to [facility] 12/16 with SOB, he is 527 lbs and has OSA, also has drainage from left leg and panus consistent with cellulitis. He was treated with abx and BC revealed strep group h in 2/2. 12/18 he became hypoxic, ams and with general decline. [facility] did not have adequate nursing staff therefore he is being transported to [facility] ICU. Currently he is on NIMVS bu with bacteremia/sepsis and suspected aspiration pna he will mos tlikely need intubation. Note he bipolar and on sedatives which may be adding to his altered mental status. We will check 12 lead and CE for completeness along with pro calcitonin panel. He is on adequate abx.  PAST MEDICAL HISTORY :  He  has a past medical history of Asthma; Hypertension; Hyperlipemia; Diabetes mellitus without complication (HCC); Hypothyroidism; Bipolar disorder (HCC); Sleep apnea, obstructive; and Depression.  PAST SURGICAL HISTORY: He  has past surgical history that includes Tonsillectomy and bone removed.  No Known Allergies  No current facility-administered medications on file prior to encounter.   Current Outpatient Prescriptions on File Prior to Encounter  Medication Sig  . Fluticasone-Salmeterol (ADVAIR) 250-50 MCG/DOSE AEPB Inhale 1 puff into the lungs 2 (two) times daily.  Marland Kitchen gabapentin (NEURONTIN) 300 MG capsule Take 300 mg by mouth 4 (four) times daily.  Marland Kitchen levothyroxine (SYNTHROID, LEVOTHROID) 75 MCG tablet Take 75 mcg by mouth daily before breakfast.  . loratadine (CLARITIN) 10 MG tablet Take 10 mg by mouth daily.  . metFORMIN (GLUCOPHAGE) 1000 MG tablet Take 1,000 mg by mouth 2 (two) times daily with a meal.  . montelukast (SINGULAIR) 10 MG tablet Take 10 mg by mouth daily.  Marland Kitchen oxcarbazepine (TRILEPTAL) 600 MG tablet Take 600  mg by mouth 2 (two) times daily.  Marland Kitchen tiotropium (SPIRIVA) 18 MCG inhalation capsule Place 18 mcg into inhaler and inhale daily.  Marland Kitchen torsemide (DEMADEX) 20 MG tablet Take 20 mg by mouth daily.  . verapamil (CALAN-SR) 120 MG CR tablet Take 120 mg by mouth at bedtime.  . ziprasidone (GEODON) 60 MG capsule Take 60 mg by mouth 2 (two) times daily with a meal.  . zolpidem (AMBIEN) 10 MG tablet Take one tablet by mouth every night at bedtime as needed for rest    FAMILY HISTORY:  His has no family status information on file.   SOCIAL HISTORY: He  reports that he has never smoked. He does not have any smokeless tobacco history on file. He reports that he does not drink alcohol or use illicit drugs.  REVIEW OF SYSTEMS:   na  SUBJECTIVE:    VITAL SIGNS: BP 128/70 mmHg  Pulse 66  Temp(Src) 100.2 F (37.9 C) (Axillary)  Resp 22  Ht  (1.803 m)  Wt 526 lb (238.592 kg)  BMI 73.39 kg/m2  SpO2 97%  HEMODYNAMICS:    VENTILATOR SETTINGS:    INTAKE / OUTPUT: I/O last 3 completed shifts: In: 320 [P.O.:320] Out: 5075 [Urine:5075]  PHYSICAL EXAMINATION: General:  MO male on NIMVS. Increased wob Neuro:  Somewhat somulent HEENT:  FM in place , no neck Cardiovascular: HSD Lungs:  Decreased air movement. Abd chest wall paradoxus noted. Abdomen:  Obese panus dressing in place, panus wound is full thickness, 16x2x1.5 cm with yellow drainage. Musculoskeletal:  intact Skin:  Leg dressing in place  LABS:  BMET  Recent Labs Lab 10/02/15 1139 10/02/15 1841 10/03/15 0403 10/04/15 0509  NA 136  --  140  --   K 4.7  --  4.2  --   CL 97*  --  102  --   CO2 26  --  26  --   BUN 29*  --  27*  --   CREATININE 1.49* 1.61* 1.57* 1.88*  GLUCOSE 131*  --  113*  --     Electrolytes  Recent Labs Lab 10/02/15 1139 10/03/15 0403  CALCIUM 8.8* 8.0*    CBC  Recent Labs Lab 10/02/15 1139 10/02/15 1841 10/03/15 0403  WBC 30.9* 23.9* 16.6*  HGB 9.6* 8.2* 8.3*  HCT 30.7* 25.0*  26.4*  PLT 373 266 240    Coag's No results for input(s): APTT, INR in the last 168 hours.  Sepsis Markers  Recent Labs Lab 10/02/15 1139 10/02/15 1841  LATICACIDVEN 3.4* 2.9*    ABG  Recent Labs Lab 10/04/15 1200  PHART 7.47*  PCO2ART 42  PO2ART 55*    Liver Enzymes  Recent Labs Lab 10/02/15 1139  AST 28  ALT 26  ALKPHOS 83  BILITOT 0.7  ALBUMIN 3.5    Cardiac Enzymes  Recent Labs Lab 10/02/15 2027 10/03/15 0014 10/03/15 0403  TROPONINI 0.23* 0.25* 0.28*    Glucose  Recent Labs Lab 10/02/15 1559  GLUCAP 111*    Imaging Dg Chest 1 View  10/04/2015  CLINICAL DATA:  Shortness of breath, history hypertension, asthma, hyperlipidemia, diabetes mellitus EXAM: CHEST 1 VIEW COMPARISON:  10/02/2015 FINDINGS: Rotated to the LEFT. Enlargement of cardiac silhouette. Diffuse BILATERAL airspace infiltrates RIGHT greater than LEFT question asymmetric edema versus infection. No gross pleural effusion or pneumothorax. Osseous structures inadequately evaluated. IMPRESSION: Enlargement of cardiac silhouette. Asymmetric pulmonary infiltrates RIGHT greater than LEFT new since previous exam question asymmetric edema versus pneumonia. Electronically Signed   By: Ulyses Southward M.D.   On: 10/04/2015 17:34     STUDIES:    CULTURES: 12/16 bc>>GPCstrep group G>>2/2>> 12/16 UC>>MS  ANTIBIOTICS: 12/17 roc>> 12/18 vanc>>  SIGNIFICANT EVENTS: 12/16 admit to [facility] with panus infection 12/18 Transfer to [facility] due to no ICU nurses at [facility].  LINES/TUBES:   DISCUSSION: 527 lb male with panus and leg infectious, sepsis. Hypoxic resp failure , refractory to treatment at [facility]. Suspect he will need intubation.  ASSESSMENT / PLAN:  PULMONARY A: MO with hypoxia CxR cw with early ARDS Suspected aspiration Hx of asthma  P:   Bipap but suspect he will need intubation O2 as needed Diuresis as tolerated but elevated creatine noted  CARDIOVASCULAR A:  HTN Presumed  sepsis  P:  Hold antihypertensives Check pro calcitonin Check lactic acid  Check CE and 12 lead  RENAL Lab Results  Component Value Date   CREATININE 1.88* 10/04/2015   CREATININE 1.57* 10/03/2015   CREATININE 1.61* 10/02/2015   CREATININE 1.08 04/26/2014   CREATININE 1.01 04/25/2014   CREATININE 2.15* 10/04/2013    A:   Renal insuff P:   Follow creatine   GASTROINTESTINAL A:   GI protection P:   PPI  HEMATOLOGIC A:   No acute issue P:  DVT protection with heparin  INFECTIOUS A:   Presumed cellulitis of panus  Suspected Hcap from ? aspiration P:   See flow sheet WOC consult put in Wet to dry dressing.  ENDOCRINE CBG (last 3)   Recent Labs  10/02/15 1559  GLUCAP 111*     A:   DM P:   SSI  NEUROLOGIC A:   Somewhat somulent but wakes up Bipolar P:   RASS goal: 0 Hold sedation Check ABG for hypercarbic state Bipolar medications   FAMILY  - Updates:   - Inter-disciplinary family meet or Palliative Care meeting due by:   day 7     Steve Jacky Hartung ACNP [facility] PCCM Pager [phone] till 3 pm If no answer page [phone] 10/04/2015, 8:53 PM

## 2015-10-04 NOTE — Care Management Note (Addendum)
Case Management Note  Patient Details  Name: Adrian DiegoCarlton B Schlossberg Jr. MRN: [id number] Date of Birth: 08/10/1961  Subjective/Objective:       Call to Dr Cherlynn KaiserSainani. Dr Cherlynn KaiserSainani will evaluate Adrian [name] for a Pulmonary Consult and need for Bipap on Monday. Adrian [name] is currently on 6L N/C and CPAP at night. Temp of 103 this afternoon.             Action/Plan:   Expected Discharge Date:                  Expected Discharge Plan:     In-House Referral:     Discharge planning Services     Post Acute Care Choice:    Choice offered to:     DME Arranged:    DME Agency:     HH Arranged:    HH Agency:     Status of Service:     Medicare Important Message Given:    Date Medicare IM Given:    Medicare IM give by:    Date Additional Medicare IM Given:    Additional Medicare Important Message give by:     If discussed at Long Length of Stay Meetings, dates discussed:    Additional Comments:  Breianna Delfino A, RN 10/04/2015, 2:52 PM

## 2015-10-04 NOTE — Progress Notes (Signed)
Pt resting in sizewise bed with head elevated Bipap in place  Tylenol given for fever 103 auxillary

## 2015-10-04 NOTE — Progress Notes (Signed)
Upon Routine vitals patients temp was 103 and oxygen saturation was in the 70's.  Oxygen was applied at 6L and rapid called.  Respiratory came and stabilized patients O2 and is now in the low 90's.  MD came and rounded orders given and patient resting comfortably with no distress.

## 2015-10-04 NOTE — Progress Notes (Signed)
   10/04/15 1200  Clinical Encounter Type  Visited With Patient  Visit Type Code  Referral From Nurse  Consult/Referral To Chaplain  Responded to Rapid Response page. Pt was responsive when I arrived.  Nursing staff was attempting to stabilize pt.  No family present.  Asbury Automotive GroupChaplain Ramiyah Mcclenahan-pager [phone]

## 2015-10-04 NOTE — Progress Notes (Signed)
ANTIBIOTIC CONSULT NOTE -follow up  Pharmacy Consult for vancomycin Day 2 Indication: cellulitis  No Known Allergies  Patient Measurements: Height:  (180.3 cm) Weight: (!) 526 lb (238.592 kg) IBW/kg (Calculated) : 75.3 Adjusted Body Weight: 141 kg  Vital Signs: Temp: 103 F (39.4 C) (12/18 1202) Temp Source: Oral (12/18 1126) BP: 138/71 mmHg (12/18 1126) Pulse Rate: 107 (12/18 1202) Intake/Output from previous day: 12/17 0701 - 12/18 0700 In: 200 [P.O.:200] Out: 3275 [Urine:3275] Intake/Output from this shift: Total I/O In: -  Out: 600 [Urine:600]  Labs:  Recent Labs  10/02/15 1139 10/02/15 1841 10/03/15 0403 10/04/15 0509  WBC 30.9* 23.9* 16.6*  --   HGB 9.6* 8.2* 8.3*  --   PLT 373 266 240  --   CREATININE 1.49* 1.61* 1.57* 1.88*   Estimated Creatinine Clearance: 89.3 mL/min (by C-G formula based on Cr of 1.88).   Microbiology: Recent Results (from the past 720 hour(s))  Urine culture     Status: None   Collection Time: 10/02/15 11:36 AM  Result Value Ref Range Status   Specimen Description URINE, RANDOM  Final   Special Requests NONE  Final   Culture MULTIPLE SPECIES PRESENT, SUGGEST RECOLLECTION  Final   Report Status 10/03/2015 FINAL  Final  Blood culture (routine x 2)     Status: None (Preliminary result)   Collection Time: 10/02/15 11:36 AM  Result Value Ref Range Status   Specimen Description BLOOD RIGHT CHEST  Final   Special Requests BOTTLES DRAWN AEROBIC AND ANAEROBIC  1CC  Final   Culture  Setup Time   Final    GRAM POSITIVE COCCI IN BOTH AEROBIC AND ANAEROBIC BOTTLES CRITICAL RESULT CALLED TO, READ BACK BY AND VERIFIED WITH: MATT MCBANE AT 0315 ON 10/03/15 RWW CONFIRMED BY [facility]    Culture   Final    STREPTOCOCCUS GROUP G SUSCEPTIBILITIES TO FOLLOW    Report Status PENDING  Incomplete  Blood Culture ID Panel (Reflexed)     Status: Abnormal   Collection Time: 10/02/15 11:36 AM  Result Value Ref Range Status   Enterococcus species  NOT DETECTED NOT DETECTED Final   Listeria monocytogenes NOT DETECTED NOT DETECTED Final   Staphylococcus species NOT DETECTED NOT DETECTED Final   Staphylococcus aureus NOT DETECTED NOT DETECTED Final   Streptococcus species DETECTED (A) NOT DETECTED Final    Comment: CRITICAL RESULT CALLED AND READ BACK BY MATT MCBANE AT 0315 ON 10/03/15 RWW    Streptococcus agalactiae NOT DETECTED NOT DETECTED Final   Streptococcus pneumoniae NOT DETECTED NOT DETECTED Final   Streptococcus pyogenes NOT DETECTED NOT DETECTED Final   Acinetobacter baumannii NOT DETECTED NOT DETECTED Final   Enterobacteriaceae species NOT DETECTED NOT DETECTED Final   Enterobacter cloacae complex NOT DETECTED NOT DETECTED Final   Escherichia coli NOT DETECTED NOT DETECTED Final   Klebsiella oxytoca NOT DETECTED NOT DETECTED Final   Klebsiella pneumoniae NOT DETECTED NOT DETECTED Final   Proteus species NOT DETECTED NOT DETECTED Final   Serratia marcescens NOT DETECTED NOT DETECTED Final   Haemophilus influenzae NOT DETECTED NOT DETECTED Final   Neisseria meningitidis NOT DETECTED NOT DETECTED Final   Pseudomonas aeruginosa NOT DETECTED NOT DETECTED Final   Candida albicans NOT DETECTED NOT DETECTED Final   Candida glabrata NOT DETECTED NOT DETECTED Final   Candida krusei NOT DETECTED NOT DETECTED Final   Candida parapsilosis NOT DETECTED NOT DETECTED Final   Candida tropicalis NOT DETECTED NOT DETECTED Final   Carbapenem resistance NOT DETECTED NOT  DETECTED Final   Methicillin resistance NOT DETECTED NOT DETECTED Final   Vancomycin resistance NOT DETECTED NOT DETECTED Final  Blood culture (routine x 2)     Status: None (Preliminary result)   Collection Time: 10/02/15 11:40 AM  Result Value Ref Range Status   Specimen Description BLOOD RIGHT AC  Final   Special Requests BOTTLES DRAWN AEROBIC AND ANAEROBIC  1CC  Final   Culture  Setup Time   Final    GRAM POSITIVE COCCI AEROBIC BOTTLE ONLY CRITICAL RESULT CALLED TO,  READ BACK BY AND VERIFIED WITH: MATT MCBANE AT 0315 ON 10/03/15 RWW CONFIRMED BY [facility]    Culture   Final    STREPTOCOCCUS GROUP G AEROBIC BOTTLE ONLY SUSCEPTIBILITIES TO FOLLOW    Report Status PENDING  Incomplete    Assessment: 54 yo male with LE cellulitis on ceftriaxone adding vancomycin; pt febrile to 101 F. Sputum cx Wcx pending Bcx: + Strep per [employer]  Goal of Therapy:  Vancomycin trough 15-20 mcg/ml   Plan:  Will order vancomycin 2000 mg IV x1 then 2000 mg IV q18h 12 h later for stacked dosing  Will order trough for 12/20 at 1030 Will order SCr for AM to monitor renal function, pt at risk of accumulating doses Will need to continue for pt improvement and culture data to narrow abx  12/18 Temp 103. Scr increase to 1.88. Per pharmacokinetic calculations- continue with Vancomcyin 2000 mg IV Q18h.  Ke 0.044,  t1/2 15.7   Vd 98.7.  ID consult. Watch renal fxn.  Bari MantisKristin Lonya Johannesen PharmD Clinical Pharmacist 10/04/2015 ,2:27 PM

## 2015-10-04 NOTE — Discharge Summary (Signed)
[facility] [place] NAME: Adrian [name]    MR#:  [id number]  DATE OF BIRTH:  03/21/1961  DATE OF ADMISSION:  10/02/2015 ADMITTING PHYSICIAN: Theodoro Grist, MD  DATE OF DISCHARGE: 10/04/2015  PRIMARY CARE PHYSICIAN: No PCP Per Patient    ADMISSION DIAGNOSIS:  Cellulitis of abdominal wall [L03.311] Fever [R50.9] Cellulitis of left lower extremity [L03.116] Sepsis, due to unspecified organism ([place]) [A41.9]  DISCHARGE DIAGNOSIS:  Active Problems:   Sepsis ([place])  acute on chronic hypoxic/hypercarbic respiratory failure. Gram negative bacteremia.   SECONDARY DIAGNOSIS:   Past Medical History  Diagnosis Date  . Asthma   . Hypertension   . Hyperlipemia   . Diabetes mellitus without complication ([place])   . Hypothyroidism   . Bipolar disorder ([place])   . Sleep apnea, obstructive   . Depression     HOSPITAL COURSE:   54 year old male with past medical history of morbid obesity, started sleep apnea, diabetes, hypertension, hyperlipidemia, bipolar bipolar disorder, hypothyroidism, presented to the hospital due to shortness of breath but also noted to be septic.  #1 sepsis-patient met criteria given his leukocytosis tachycardia and lower extremity cellulitis. -Patient's blood cultures are positive for group G Streptococcus.  -Patient is currently on IV vancomycin, ceftriaxone. -Still continues to have fever spikes and will follow fever curve. - cont. Local wound care to LE b/l. Patient would benefit from infectious disease consult.  #2 acute on chronic hypoxic/hypercarbic respiratory failure-this is likely combination of underlying obesity pickwickian syndrome combined with CHF. -Continue BiPAP support, IV diuretics, I will also give the patient IV Solu-Medrol. -Check a chest x-ray. Patient likely will benefit from stepdown/ICU level of care but we will have nursing staff to accommodate this presently. Patient likely we need to  be transferred to a tertiary care center and I have placed a call out to [facility] transfer center and I'm awaiting a call back.  #3 bilateral lower extremity cellulitis-the cause of patient's sepsis. -Continue ceftriaxone, IV vancomycin. Blood cultures positive for group G Streptococcus. -Consider infectious disease consult. -cont. Local wound care as per wound team.   #4 elevated troponin-likely the setting of demand ischemia from sepsis and underlying diastolic CHF. -Continue beta blocker, verapamil -Appreciate cardiology input and no further acute intervention. Awaiting  Echo results  #5 Hypothyroidism - cont. Synthroid.   #6 Hypertension - cont. Verapamil, Metoprolol.   #7 hx of Bipolar Disorder - cont. Trileptal, Geodon.   #8. OSA - cont. CPAP.   #9 COPD - now with acute on chronic hypoxic/hypercarbic respiratory failure. -Continue treatment as mentioned above. Continue IV steroids, BiPAP support.  DISCHARGE CONDITIONS:   Guarded  CONSULTS OBTAINED:  Treatment Team:  Corey Skains, MD  DRUG ALLERGIES:  No Known Allergies  DISCHARGE MEDICATIONS:   Current Discharge Medication List    START taking these medications   Details  cefTRIAXone 2 g in dextrose 5 % 50 mL Inject 2 g into the vein daily.    enoxaparin (LOVENOX) 40 MG/0.4ML injection Inject 0.4 mLs (40 mg total) into the skin every 12 (twelve) hours. Qty: 0 Syringe    vancomycin 2,000 mg in sodium chloride 0.9 % 500 mL Inject 2,000 mg into the vein every 18 (eighteen) hours.      CONTINUE these medications which have NOT CHANGED   Details  Fluticasone-Salmeterol (ADVAIR) 250-50 MCG/DOSE AEPB Inhale 1 puff into the lungs 2 (two) times daily.    gabapentin (NEURONTIN) 300 MG capsule Take 300  mg by mouth 4 (four) times daily.    levothyroxine (SYNTHROID, LEVOTHROID) 75 MCG tablet Take 75 mcg by mouth daily before breakfast.    loratadine (CLARITIN) 10 MG tablet Take 10 mg by mouth daily.     montelukast (SINGULAIR) 10 MG tablet Take 10 mg by mouth daily.    nystatin (MYCOSTATIN/NYSTOP) 100000 UNIT/GM POWD Apply topically 3 (three) times daily.    omeprazole (PRILOSEC) 20 MG capsule Take 20 mg by mouth daily.    oxcarbazepine (TRILEPTAL) 600 MG tablet Take 600 mg by mouth 2 (two) times daily.    potassium chloride SA (K-DUR,KLOR-CON) 20 MEQ tablet Take 20 mEq by mouth daily.    tiotropium (SPIRIVA) 18 MCG inhalation capsule Place 18 mcg into inhaler and inhale daily.    torsemide (DEMADEX) 20 MG tablet Take 20 mg by mouth daily.    verapamil (CALAN-SR) 120 MG CR tablet Take 120 mg by mouth at bedtime.    ziprasidone (GEODON) 60 MG capsule Take 60 mg by mouth 2 (two) times daily with a meal.    zolpidem (AMBIEN) 10 MG tablet Take one tablet by mouth every night at bedtime as needed for rest Qty: 10 tablet, Refills: 0      STOP taking these medications     lisinopril (PRINIVIL,ZESTRIL) 5 MG tablet      metFORMIN (GLUCOPHAGE) 1000 MG tablet      alum & mag hydroxide-simeth (MAALOX/MYLANTA) 200-200-20 MG/5ML suspension      clonazePAM (KLONOPIN) 0.5 MG tablet      guaifenesin (ROBITUSSIN) 100 MG/5ML syrup      magnesium hydroxide (MILK OF MAGNESIA) 400 MG/5ML suspension      metoprolol tartrate (LOPRESSOR) 25 MG tablet      sennosides-docusate sodium (SENOKOT-S) 8.6-50 MG tablet      simethicone (MYLICON) 500 MG chewable tablet          DISCHARGE INSTRUCTIONS:   DIET:  Nothing by mouth due to respiratory failure  DISCHARGE CONDITION:  Fair  ACTIVITY:  Activity as tolerated  OXYGEN:  Home Oxygen: Yes.     Oxygen Delivery: BiPAP support with oxygen at 45%.  DISCHARGE LOCATION:  [facility].   If you experience worsening of your admission symptoms, develop shortness of breath, life threatening emergency, suicidal or homicidal thoughts you must seek medical attention immediately by calling 911 or calling your MD immediately  if symptoms  less severe.  You Must read complete instructions/literature along with all the possible adverse reactions/side effects for all the Medicines you take and that have been prescribed to you. Take any new Medicines after you have completely understood and accpet all the possible adverse reactions/side effects.   Please note  You were cared for by a hospitalist during your hospital stay. If you have any questions about your discharge medications or the care you received while you were in the hospital after you are discharged, you can call the unit and asked to speak with the hospitalist on call if the hospitalist that took care of you is not available. Once you are discharged, your primary care physician will handle any further medical issues. Please note that NO REFILLS for any discharge medications will be authorized once you are discharged, as it is imperative that you return to your primary care physician (or establish a relationship with a primary care physician if you do not have one) for your aftercare needs so that they can reassess your need for medications and monitor your lab values.   DATA  REVIEW:   CBC  Recent Labs Lab 10/03/15 0403  WBC 16.6*  HGB 8.3*  HCT 26.4*  PLT 240    Chemistries   Recent Labs Lab 10/02/15 1139  10/03/15 0403 10/04/15 0509  NA 136  --  140  --   K 4.7  --  4.2  --   CL 97*  --  102  --   CO2 26  --  26  --   GLUCOSE 131*  --  113*  --   BUN 29*  --  27*  --   CREATININE 1.49*  < > 1.57* 1.88*  CALCIUM 8.8*  --  8.0*  --   AST 28  --   --   --   ALT 26  --   --   --   ALKPHOS 83  --   --   --   BILITOT 0.7  --   --   --   < > = values in this interval not displayed.  Cardiac Enzymes  Recent Labs Lab 10/03/15 0403  TROPONINI 0.28*    Microbiology Results  Results for orders placed or performed during the hospital encounter of 10/02/15  Urine culture     Status: None   Collection Time: 10/02/15 11:36 AM  Result Value Ref Range  Status   Specimen Description URINE, RANDOM  Final   Special Requests NONE  Final   Culture MULTIPLE SPECIES PRESENT, SUGGEST RECOLLECTION  Final   Report Status 10/03/2015 FINAL  Final  Blood culture (routine x 2)     Status: None (Preliminary result)   Collection Time: 10/02/15 11:36 AM  Result Value Ref Range Status   Specimen Description BLOOD RIGHT CHEST  Final   Special Requests BOTTLES DRAWN AEROBIC AND ANAEROBIC  1CC  Final   Culture  Setup Time   Final    GRAM POSITIVE COCCI IN BOTH AEROBIC AND ANAEROBIC BOTTLES CRITICAL RESULT CALLED TO, READ BACK BY AND VERIFIED WITH: MATT MCBANE AT 1610 ON 10/03/15 RWW CONFIRMED BY [facility]    Culture   Final    STREPTOCOCCUS GROUP G SUSCEPTIBILITIES TO FOLLOW    Report Status PENDING  Incomplete  Blood Culture ID Panel (Reflexed)     Status: Abnormal   Collection Time: 10/02/15 11:36 AM  Result Value Ref Range Status   Enterococcus species NOT DETECTED NOT DETECTED Final   Listeria monocytogenes NOT DETECTED NOT DETECTED Final   Staphylococcus species NOT DETECTED NOT DETECTED Final   Staphylococcus aureus NOT DETECTED NOT DETECTED Final   Streptococcus species DETECTED (A) NOT DETECTED Final    Comment: CRITICAL RESULT CALLED AND READ BACK BY MATT MCBANE AT 0315 ON 10/03/15 RWW    Streptococcus agalactiae NOT DETECTED NOT DETECTED Final   Streptococcus pneumoniae NOT DETECTED NOT DETECTED Final   Streptococcus pyogenes NOT DETECTED NOT DETECTED Final   Acinetobacter baumannii NOT DETECTED NOT DETECTED Final   Enterobacteriaceae species NOT DETECTED NOT DETECTED Final   Enterobacter cloacae complex NOT DETECTED NOT DETECTED Final   Escherichia coli NOT DETECTED NOT DETECTED Final   Klebsiella oxytoca NOT DETECTED NOT DETECTED Final   Klebsiella pneumoniae NOT DETECTED NOT DETECTED Final   Proteus species NOT DETECTED NOT DETECTED Final   Serratia marcescens NOT DETECTED NOT DETECTED Final   Haemophilus influenzae NOT DETECTED NOT  DETECTED Final   Neisseria meningitidis NOT DETECTED NOT DETECTED Final   Pseudomonas aeruginosa NOT DETECTED NOT DETECTED Final   Candida albicans NOT DETECTED NOT DETECTED Final  Candida glabrata NOT DETECTED NOT DETECTED Final   Candida krusei NOT DETECTED NOT DETECTED Final   Candida parapsilosis NOT DETECTED NOT DETECTED Final   Candida tropicalis NOT DETECTED NOT DETECTED Final   Carbapenem resistance NOT DETECTED NOT DETECTED Final   Methicillin resistance NOT DETECTED NOT DETECTED Final   Vancomycin resistance NOT DETECTED NOT DETECTED Final  Blood culture (routine x 2)     Status: None (Preliminary result)   Collection Time: 10/02/15 11:40 AM  Result Value Ref Range Status   Specimen Description BLOOD RIGHT AC  Final   Special Requests BOTTLES DRAWN AEROBIC AND ANAEROBIC  1CC  Final   Culture  Setup Time   Final    GRAM POSITIVE COCCI AEROBIC BOTTLE ONLY CRITICAL RESULT CALLED TO, READ BACK BY AND VERIFIED WITH: MATT MCBANE AT 0315 ON 10/03/15 RWW CONFIRMED BY PM[facility]    Culture   Final    STREPTOCOCCUS GROUP G AEROBIC BOTTLE ONLY SUSCEPTIBILITIES TO FOLLOW    Report Status PENDING  Incomplete    RADIOLOGY:  No results found.    Management plans discussed with the patient, family and they are in agreement.  CODE STATUS:     Code Status Orders        Start     Ordered   10/02/15 1433  Full code   Continuous     10/02/15 1432      TOTAL TIME TAKING CARE OF THIS PATIENT: 45 minutes.    Henreitta Leber M.D on 10/04/2015 at 4:56 PM  Between 7am to 6pm - Pager - [phone]  After 6pm go to [email or web address] - password [email or web address] [facility]  [place]  Office  [phone]  CC: Primary care physician; No PCP Per Patient

## 2015-10-04 NOTE — Progress Notes (Addendum)
Pt. Seen earlier but called back to reevaluate pt. As he is working to breath and is short of breath.    Filed Vitals:   10/04/15 1202 10/04/15 1515  BP:  138/57  Pulse: 107 99  Temp: 103 F (39.4 C) 99.8 F (37.7 C)  Resp:  18   PE  Gen- patient lying in bed in moderate to severe respiratory distress. Heart - S1, S2, Tachy.  Lungs - Difficult to assess due to morbid obesity, + use of accessory muscles, some rhonchi anteriorly,.    Assessment & Plan  1. Acute on chronic Resp. Failure w/ Hypercarbia/hypoxia - likely a combination of Obesity Pickwickian syndrome with underlying CHF.  - will give one dose 40 mg of IV lasix and monitor.   - cont. O2 support and will place on Bipap and repeat ABG in an hour or so.     - will get stat CXR and also given 125 of IV solumedrol.  - pt. Likely needs step down/ICU level of care but we do not having nursing staff in the ICU to handle this patient presently.   - I [facility] and I am awaiting callback.     Total Critical Care Time Spent: 35 min.

## 2015-10-04 NOTE — Progress Notes (Signed)
[name] NAME: Adrian [name]    MR#:  [id number]  DATE OF BIRTH:  23-Jun-1961  SUBJECTIVE:   Patient developed an episode of hypoxia this morning which quickly resolved with some oxygen. Patient also continues to have fever spikes to greater than 102. Blood cultures were positive for group G Streptococcus.  REVIEW OF SYSTEMS:    Review of Systems  Constitutional: Negative for fever and chills.  HENT: Negative for congestion and tinnitus.   Eyes: Negative for blurred vision and double vision.  Respiratory: Negative for cough, shortness of breath and wheezing.   Cardiovascular: Negative for chest pain, orthopnea and PND.  Gastrointestinal: Negative for nausea, vomiting, abdominal pain and diarrhea.  Genitourinary: Negative for dysuria and hematuria.  Neurological: Positive for weakness (generalized). Negative for dizziness, sensory change and focal weakness.  All other systems reviewed and are negative.   Nutrition: Heart healthy Tolerating Diet: Yes Tolerating PT: Await Eval.    DRUG ALLERGIES:  No Known Allergies  VITALS:  Blood pressure 138/71, pulse 107, temperature 103 F (39.4 C), temperature source Oral, resp. rate 18, height '5\' 11"'$  (1.803 m), weight 238.592 kg (526 lb), SpO2 92 %.  PHYSICAL EXAMINATION:   Physical Exam  GENERAL:  54 y.o.-year-old morbidly obese patient lying in the bed in no acute distress.  EYES: Pupils equal, round, reactive to light and accommodation. No scleral icterus. Extraocular muscles intact.  HEENT: Head atraumatic, normocephalic. Oropharynx and nasopharynx clear.  NECK:  Supple, no jugular venous distention. No thyroid enlargement, no tenderness.  LUNGS: Normal breath sounds bilaterally, no wheezing, rales, rhonchi. No use of accessory muscles of respiration. Difficult to assess due to morbid obesity.  CARDIOVASCULAR: S1, S2 normal. No murmurs, rubs, or gallops.  ABDOMEN:  Soft, nontender, nondistended. Bowel sounds present. No organomegaly or mass.  EXTREMITIES: No cyanosis, clubbing, + 1-2 edema b/l.  + atrophy of muscles on LE b/l.   NEUROLOGIC: Cranial nerves II through XII are intact. No focal Motor or sensory deficits b/l.  Globally weak & bedbound PSYCHIATRIC: The patient is alert and oriented x 3. Good affect.  SKIN: No obvious rash, lesion, or ulcer. Bilateral lower extremities wrapped in Kerlix/Unna boots.   LABORATORY PANEL:   CBC  Recent Labs Lab 10/03/15 0403  WBC 16.6*  HGB 8.3*  HCT 26.4*  PLT 240   ------------------------------------------------------------------------------------------------------------------  Chemistries   Recent Labs Lab 10/02/15 1139  10/03/15 0403 10/04/15 0509  NA 136  --  140  --   K 4.7  --  4.2  --   CL 97*  --  102  --   CO2 26  --  26  --   GLUCOSE 131*  --  113*  --   BUN 29*  --  27*  --   CREATININE 1.49*  < > 1.57* 1.88*  CALCIUM 8.8*  --  8.0*  --   AST 28  --   --   --   ALT 26  --   --   --   ALKPHOS 83  --   --   --   BILITOT 0.7  --   --   --   < > = values in this interval not displayed. ------------------------------------------------------------------------------------------------------------------  Cardiac Enzymes  Recent Labs Lab 10/03/15 0403  TROPONINI 0.28*   ------------------------------------------------------------------------------------------------------------------  RADIOLOGY:  No results found.   ASSESSMENT AND PLAN:   54 year old male with past medical history of morbid obesity, started  sleep apnea, diabetes, hypertension, hyperlipidemia, bipolar bipolar disorder, hypothyroidism, presented to the hospital due to shortness of breath but also noted to be septic.  #1 sepsis-patient met criteria given his leukocytosis tachycardia and lower extremity cellulitis. -Patient's blood cultures are positive for group G Streptococcus. I will continue ceftriaxone, also  continue IV vancomycin. I will get infectious disease consult tomorrow. -Still continues to have fever spikes and will follow fever curve. - cont. Local wound care to LE b/l.   #2 bilateral lower extremity cellulitis-the cause of patient's sepsis. -Continue ceftriaxone, IV vancomycin. Blood cultures positive for group G Streptococcus. -I will get ID consult in a.m. -cont. Local wound care as per wound team.    #3 elevated troponin-likely the setting of demand ischemia from sepsis and underlying diastolic CHF. -Continue beta blocker, verapamil -Appreciate cardiology input and no further acute intervention.  Await Echo results  #4 hypoxia-patient developed an episode of low O2 sats in the 70s early this morning. Improved with some O2 supplementation. ABG showing some CO2 retention but is chronic with a normal pH. -Continue O2 supplementation for now and wean as tolerated.  #5 Hypothyroidism - cont. Synthroid.   #6 Hypertension - cont. Verapamil, Metoprolol.   #7 hx of Bipolar Disorder - cont. Trileptal, Geodon.   #8.  OSA - cont. CPAP.    #9 COPD - cont. Spiriva.   - no acute exacerbation.   Discussed with case manager and patient apparently is not a candidate for LTAC presently. He likely will need long-term care and probably will need to be discharged to rehabilitation.  All the records are reviewed and case discussed with Care Management/Social Workerr. Management plans discussed with the patient, family and they are in agreement.  CODE STATUS: Full Code  DVT Prophylaxis: Lovenox  TOTAL TIME TAKING CARE OF THIS PATIENT: 35 minutes.   POSSIBLE D/C IN 2-3 DAYS, DEPENDING ON CLINICAL CONDITION.   Henreitta Leber M.D on 10/04/2015 at 2:10 PM  Between 7am to 6pm - Pager - [phone]  After 6pm go to [email or web address] - password [email or web address] [facility]  [place] [facility]  Office  [phone]  CC: Primary care physician; No PCP Per Patient

## 2015-10-04 NOTE — Progress Notes (Addendum)
ANTIBIOTIC CONSULT NOTE - INITIAL  Pharmacy Consult for Vancocin and Rocephin Indication: bacteremia  No Known Allergies  Patient Measurements: Height: 5\' 11"  (180.3 cm) Weight: (!) 514 lb (233.149 kg) IBW/kg (Calculated) : 75.3  Vital Signs: Temp: 98.9 F (37.2 C) (12/18 2200) Temp Source: Axillary (12/18 2200) BP: 137/78 mmHg (12/18 2200) Pulse Rate: 87 (12/18 2210)  Labs:  Recent Labs  10/02/15 1139 10/02/15 1841 10/03/15 0403 10/04/15 0509  WBC 30.9* 23.9* 16.6*  --   HGB 9.6* 8.2* 8.3*  --   PLT 373 266 240  --   CREATININE 1.49* 1.61* 1.57* 1.88*   Estimated Creatinine Clearance: 87.9 mL/min (by C-G formula based on Cr of 1.88).   Microbiology: Recent Results (from the past 720 hour(s))  Urine culture     Status: None   Collection Time: 10/02/15 11:36 AM  Result Value Ref Range Status   Specimen Description URINE, RANDOM  Final   Special Requests NONE  Final   Culture MULTIPLE SPECIES PRESENT, SUGGEST RECOLLECTION  Final   Report Status 10/03/2015 FINAL  Final  Blood culture (routine x 2)     Status: None (Preliminary result)   Collection Time: 10/02/15 11:36 AM  Result Value Ref Range Status   Specimen Description BLOOD RIGHT CHEST  Final   Special Requests BOTTLES DRAWN AEROBIC AND ANAEROBIC  1CC  Final   Culture  Setup Time   Final    GRAM POSITIVE COCCI IN BOTH AEROBIC AND ANAEROBIC BOTTLES CRITICAL RESULT CALLED TO, READ BACK BY AND VERIFIED WITH: MATT MCBANE AT 0315 ON 10/03/15 RWW CONFIRMED BY PMH    Culture   Final    STREPTOCOCCUS GROUP G SUSCEPTIBILITIES TO FOLLOW    Report Status PENDING  Incomplete  Blood Culture ID Panel (Reflexed)     Status: Abnormal   Collection Time: 10/02/15 11:36 AM  Result Value Ref Range Status   Enterococcus species NOT DETECTED NOT DETECTED Final   Listeria monocytogenes NOT DETECTED NOT DETECTED Final   Staphylococcus species NOT DETECTED NOT DETECTED Final   Staphylococcus aureus NOT DETECTED NOT  DETECTED Final   Streptococcus species DETECTED (A) NOT DETECTED Final    Comment: CRITICAL RESULT CALLED AND READ BACK BY MATT MCBANE AT 0315 ON 10/03/15 RWW    Streptococcus agalactiae NOT DETECTED NOT DETECTED Final   Streptococcus pneumoniae NOT DETECTED NOT DETECTED Final   Streptococcus pyogenes NOT DETECTED NOT DETECTED Final   Acinetobacter baumannii NOT DETECTED NOT DETECTED Final   Enterobacteriaceae species NOT DETECTED NOT DETECTED Final   Enterobacter cloacae complex NOT DETECTED NOT DETECTED Final   Escherichia coli NOT DETECTED NOT DETECTED Final   Klebsiella oxytoca NOT DETECTED NOT DETECTED Final   Klebsiella pneumoniae NOT DETECTED NOT DETECTED Final   Proteus species NOT DETECTED NOT DETECTED Final   Serratia marcescens NOT DETECTED NOT DETECTED Final   Haemophilus influenzae NOT DETECTED NOT DETECTED Final   Neisseria meningitidis NOT DETECTED NOT DETECTED Final   Pseudomonas aeruginosa NOT DETECTED NOT DETECTED Final   Candida albicans NOT DETECTED NOT DETECTED Final   Candida glabrata NOT DETECTED NOT DETECTED Final   Candida krusei NOT DETECTED NOT DETECTED Final   Candida parapsilosis NOT DETECTED NOT DETECTED Final   Candida tropicalis NOT DETECTED NOT DETECTED Final   Carbapenem resistance NOT DETECTED NOT DETECTED Final   Methicillin resistance NOT DETECTED NOT DETECTED Final   Vancomycin resistance NOT DETECTED NOT DETECTED Final  Blood culture (routine x 2)     Status: None (  Preliminary result)   Collection Time: 10/02/15 11:40 AM  Result Value Ref Range Status   Specimen Description BLOOD RIGHT AC  Final   Special Requests BOTTLES DRAWN AEROBIC AND ANAEROBIC  1CC  Final   Culture  Setup Time   Final    GRAM POSITIVE COCCI AEROBIC BOTTLE ONLY CRITICAL RESULT CALLED TO, READ BACK BY AND VERIFIED WITH: MATT MCBANE AT 0315 ON 10/03/15 RWW CONFIRMED BY PMH    Culture   Final    STREPTOCOCCUS GROUP G AEROBIC BOTTLE ONLY SUSCEPTIBILITIES TO FOLLOW     Report Status PENDING  Incomplete    Medical History: Past Medical History  Diagnosis Date  . Asthma   . Hypertension   . Hyperlipemia   . Diabetes mellitus without complication (HCC)   . Hypothyroidism   . Bipolar disorder (HCC)   . Sleep apnea, obstructive   . Depression     Medications:  Prescriptions prior to admission  Medication Sig Dispense Refill Last Dose  . cefTRIAXone 2 g in dextrose 5 % 50 mL Inject 2 g into the vein daily.     Marland Kitchen enoxaparin (LOVENOX) 40 MG/0.4ML injection Inject 0.4 mLs (40 mg total) into the skin every 12 (twelve) hours. 0 Syringe    . Fluticasone-Salmeterol (ADVAIR) 250-50 MCG/DOSE AEPB Inhale 1 puff into the lungs 2 (two) times daily.   10/01/2015 at Unknown time  . gabapentin (NEURONTIN) 300 MG capsule Take 300 mg by mouth 4 (four) times daily.   10/01/2015 at Unknown time  . levothyroxine (SYNTHROID, LEVOTHROID) 75 MCG tablet Take 75 mcg by mouth daily before breakfast.   10/01/2015 at Unknown time  . loratadine (CLARITIN) 10 MG tablet Take 10 mg by mouth daily.   10/01/2015 at Unknown time  . montelukast (SINGULAIR) 10 MG tablet Take 10 mg by mouth daily.   10/01/2015 at Unknown time  . nystatin (MYCOSTATIN/NYSTOP) 100000 UNIT/GM POWD Apply topically 3 (three) times daily.   10/01/2015 at Unknown time  . omeprazole (PRILOSEC) 20 MG capsule Take 20 mg by mouth daily.   10/01/2015 at Unknown time  . oxcarbazepine (TRILEPTAL) 600 MG tablet Take 600 mg by mouth 2 (two) times daily.   10/01/2015 at Unknown time  . potassium chloride SA (K-DUR,KLOR-CON) 20 MEQ tablet Take 20 mEq by mouth daily.   10/01/2015 at Unknown time  . tiotropium (SPIRIVA) 18 MCG inhalation capsule Place 18 mcg into inhaler and inhale daily.   10/01/2015 at Unknown time  . torsemide (DEMADEX) 20 MG tablet Take 20 mg by mouth daily.   10/01/2015 at Unknown time  . vancomycin 2,000 mg in sodium chloride 0.9 % 500 mL Inject 2,000 mg into the vein every 18 (eighteen) hours.     .  verapamil (CALAN-SR) 120 MG CR tablet Take 120 mg by mouth at bedtime.   10/01/2015 at Unknown time  . ziprasidone (GEODON) 60 MG capsule Take 60 mg by mouth 2 (two) times daily with a meal.   10/01/2015 at Unknown time  . zolpidem (AMBIEN) 10 MG tablet Take one tablet by mouth every night at bedtime as needed for rest 10 tablet 0 prn at prn    Assessment: 54yo male tx'd from [facility] to ICU for acute on chronic respiratory failure, started on vanc for cellulitis at [facility], now w/ GPC and GBS on blood cx, sensitivities pending, to broaden ABX coverage.  Goal of Therapy:  Vancomycin trough level 15-20 mcg/ml  Plan:  Has rec'd vanc 2g x2 doses at [facility] w/  plan for Q18H dosing; will continue with vancomycin  IV Q12H as well as Rocephin 2g IV Q24H and monitor CBC, C/S, levels prn.  Vernard Gambles, PharmD, BCPS  10/04/2015,10:31 PM

## 2015-10-05 DIAGNOSIS — J80 Acute respiratory distress syndrome: Secondary | ICD-10-CM

## 2015-10-05 DIAGNOSIS — R6520 Severe sepsis without septic shock: Secondary | ICD-10-CM

## 2015-10-05 DIAGNOSIS — J9601 Acute respiratory failure with hypoxia: Secondary | ICD-10-CM

## 2015-10-05 DIAGNOSIS — L03311 Cellulitis of abdominal wall: Secondary | ICD-10-CM

## 2015-10-05 DIAGNOSIS — B954 Other streptococcus as the cause of diseases classified elsewhere: Secondary | ICD-10-CM

## 2015-10-05 DIAGNOSIS — A419 Sepsis, unspecified organism: Principal | ICD-10-CM

## 2015-10-05 DIAGNOSIS — R7881 Bacteremia: Secondary | ICD-10-CM

## 2015-10-05 DIAGNOSIS — N179 Acute kidney failure, unspecified: Secondary | ICD-10-CM

## 2015-10-05 LAB — COMPREHENSIVE METABOLIC PANEL
ALK PHOS: 79 U/L (ref 38–126)
ALT: 95 U/L — AB (ref 17–63)
AST: 106 U/L — AB (ref 15–41)
Albumin: 2.5 g/dL — ABNORMAL LOW (ref 3.5–5.0)
Anion gap: 10 (ref 5–15)
BUN: 30 mg/dL — AB (ref 6–20)
CHLORIDE: 101 mmol/L (ref 101–111)
CO2: 26 mmol/L (ref 22–32)
CREATININE: 1.86 mg/dL — AB (ref 0.61–1.24)
Calcium: 7.9 mg/dL — ABNORMAL LOW (ref 8.9–10.3)
GFR calc Af Amer: 46 mL/min — ABNORMAL LOW (ref >60)
GFR, EST NON AFRICAN AMERICAN: 39 mL/min — AB (ref >60)
Glucose, Bld: 156 mg/dL — ABNORMAL HIGH (ref 65–99)
Potassium: 4.4 mmol/L (ref 3.5–5.1)
Sodium: 137 mmol/L (ref 135–145)
Total Bilirubin: 0.5 mg/dL (ref 0.3–1.2)
Total Protein: 6.8 g/dL (ref 6.5–8.1)

## 2015-10-05 LAB — CBC
HEMATOCRIT: 24.1 % — AB (ref 39.0–52.0)
HEMATOCRIT: 26.2 % — AB (ref 39.0–52.0)
HEMOGLOBIN: 7.3 g/dL — AB (ref 13.0–17.0)
HEMOGLOBIN: 8.2 g/dL — AB (ref 13.0–17.0)
MCH: 28.4 pg (ref 26.0–34.0)
MCH: 27.3 pg (ref 26.0–34.0)
MCHC: 30.3 g/dL (ref 30.0–36.0)
MCHC: 31.3 g/dL (ref 30.0–36.0)
MCV: 90.7 fL (ref 78.0–100.0)
MCV: 90.3 fL (ref 78.0–100.0)
Platelets: 214 10*3/uL (ref 150–400)
Platelets: 198 10*3/uL (ref 150–400)
RBC: 2.89 MIL/uL — ABNORMAL LOW (ref 4.22–5.81)
RBC: 2.67 MIL/uL — AB (ref 4.22–5.81)
RDW: 16.9 % — ABNORMAL HIGH (ref 11.5–15.5)
RDW: 16.6 % — ABNORMAL HIGH (ref 11.5–15.5)
WBC: 13.7 10*3/uL — AB (ref 4.0–10.5)
WBC: 8.9 10*3/uL (ref 4.0–10.5)

## 2015-10-05 LAB — MAGNESIUM
MAGNESIUM: 2.5 mg/dL — AB (ref 1.7–2.4)
MAGNESIUM: 2.7 mg/dL — AB (ref 1.7–2.4)

## 2015-10-05 LAB — TROPONIN I
TROPONIN I: 0.07 ng/mL — AB (ref <0.031)
TROPONIN I: 0.05 ng/mL — AB (ref <0.031)
Troponin I: 0.13 ng/mL — ABNORMAL HIGH (ref <0.031)

## 2015-10-05 LAB — POCT I-STAT 3, ART BLOOD GAS (G3+)
BICARBONATE: 28.2 meq/L — AB (ref 20.0–24.0)
O2 Saturation: 100 %
PCO2 ART: 63.7 mmHg — AB (ref 35.0–45.0)
PH ART: 7.253 — AB (ref 7.350–7.450)
Patient temperature: 98.1
TCO2: 30 mmol/L (ref 0–100)
pO2, Arterial: 278 mmHg — ABNORMAL HIGH (ref 80.0–100.0)

## 2015-10-05 LAB — BLOOD GAS, ARTERIAL
Acid-base deficit: 1.5 mmol/L (ref 0.0–2.0)
BICARBONATE: 23.8 meq/L (ref 20.0–24.0)
FIO2: 0.6
LHR: 26 {breaths}/min
MECHVT: 450 mL
O2 SAT: 99.1 %
PATIENT TEMPERATURE: 98.6
PEEP/CPAP: 10 cmH2O
PH ART: 7.316 — AB (ref 7.350–7.450)
PO2 ART: 205 mmHg — AB (ref 80.0–100.0)
TCO2: 25.2 mmol/L (ref 0–100)
pCO2 arterial: 48 mmHg — ABNORMAL HIGH (ref 35.0–45.0)

## 2015-10-05 LAB — PHOSPHORUS
PHOSPHORUS: 7.2 mg/dL — AB (ref 2.5–4.6)
Phosphorus: 6.8 mg/dL — ABNORMAL HIGH (ref 2.5–4.6)

## 2015-10-05 LAB — GLUCOSE, CAPILLARY
GLUCOSE-CAPILLARY: 155 mg/dL — AB (ref 65–99)
GLUCOSE-CAPILLARY: 144 mg/dL — AB (ref 65–99)
GLUCOSE-CAPILLARY: 138 mg/dL — AB (ref 65–99)
Glucose-Capillary: 155 mg/dL — ABNORMAL HIGH (ref 65–99)
Glucose-Capillary: 165 mg/dL — ABNORMAL HIGH (ref 65–99)
Glucose-Capillary: 135 mg/dL — ABNORMAL HIGH (ref 65–99)

## 2015-10-05 LAB — PROTIME-INR
INR: 1.23 (ref 0.00–1.49)
Prothrombin Time: 15.7 seconds — ABNORMAL HIGH (ref 11.6–15.2)

## 2015-10-05 LAB — STREP PNEUMONIAE URINARY ANTIGEN: STREP PNEUMO URINARY ANTIGEN: NEGATIVE

## 2015-10-05 LAB — BASIC METABOLIC PANEL
ANION GAP: 13 (ref 5–15)
BUN: 34 mg/dL — ABNORMAL HIGH (ref 6–20)
CHLORIDE: 101 mmol/L (ref 101–111)
CO2: 25 mmol/L (ref 22–32)
Calcium: 7.6 mg/dL — ABNORMAL LOW (ref 8.9–10.3)
Creatinine, Ser: 1.85 mg/dL — ABNORMAL HIGH (ref 0.61–1.24)
GFR calc non Af Amer: 40 mL/min — ABNORMAL LOW (ref >60)
GFR, EST AFRICAN AMERICAN: 46 mL/min — AB (ref >60)
Glucose, Bld: 168 mg/dL — ABNORMAL HIGH (ref 65–99)
Potassium: 4.3 mmol/L (ref 3.5–5.1)
SODIUM: 139 mmol/L (ref 135–145)

## 2015-10-05 LAB — PROCALCITONIN
PROCALCITONIN: 12.7 ng/mL
Procalcitonin: 12.16 ng/mL

## 2015-10-05 LAB — APTT: APTT: 44 s — AB (ref 24–37)

## 2015-10-05 LAB — CULTURE, BLOOD (ROUTINE X 2)

## 2015-10-05 LAB — LIPASE, BLOOD: Lipase: 19 U/L (ref 11–51)

## 2015-10-05 LAB — AMYLASE: AMYLASE: 33 U/L (ref 28–100)

## 2015-10-05 LAB — CORTISOL: CORTISOL PLASMA: 14.6 ug/dL

## 2015-10-05 LAB — TRIGLYCERIDES: TRIGLYCERIDES: 363 mg/dL — AB (ref <150)

## 2015-10-05 LAB — MRSA PCR SCREENING: MRSA BY PCR: POSITIVE — AB

## 2015-10-05 LAB — LACTIC ACID, PLASMA: Lactic Acid, Venous: 0.9 mmol/L (ref 0.5–2.0)

## 2015-10-05 LAB — BRAIN NATRIURETIC PEPTIDE: B Natriuretic Peptide: 239.7 pg/mL — ABNORMAL HIGH (ref 0.0–100.0)

## 2015-10-05 MED ORDER — INSULIN ASPART 100 UNIT/ML ~~LOC~~ SOLN
0.0000 [IU] | SUBCUTANEOUS | Status: DC
  Administered 2015-10-05 (×2): 2 [IU] via SUBCUTANEOUS
  Administered 2015-10-05 (×3): 1 [IU] via SUBCUTANEOUS
  Administered 2015-10-05: 2 [IU] via SUBCUTANEOUS
  Administered 2015-10-06 – 2015-10-11 (×15): 1 [IU] via SUBCUTANEOUS
  Administered 2015-10-11: 2 [IU] via SUBCUTANEOUS
  Administered 2015-10-12 – 2015-10-17 (×20): 1 [IU] via SUBCUTANEOUS

## 2015-10-05 MED ORDER — HEPARIN SODIUM (PORCINE) 5000 UNIT/ML IJ SOLN
5000.0000 [IU] | Freq: Three times a day (TID) | INTRAMUSCULAR | Status: DC
  Administered 2015-10-05 – 2015-10-20 (×46): 5000 [IU] via SUBCUTANEOUS
  Filled 2015-10-05 (×45): qty 1

## 2015-10-05 MED ORDER — OXCARBAZEPINE 300 MG PO TABS
600.0000 mg | ORAL_TABLET | Freq: Two times a day (BID) | ORAL | Status: DC
  Filled 2015-10-05 (×2): qty 2

## 2015-10-05 MED ORDER — ZIPRASIDONE HCL 60 MG PO CAPS
60.0000 mg | ORAL_CAPSULE | Freq: Two times a day (BID) | ORAL | Status: DC
  Administered 2015-10-05 – 2015-10-12 (×13): 60 mg via ORAL
  Filled 2015-10-05 (×2): qty 3
  Filled 2015-10-05 (×12): qty 1
  Filled 2015-10-05: qty 3
  Filled 2015-10-05 (×3): qty 1
  Filled 2015-10-05 (×2): qty 3
  Filled 2015-10-05 (×2): qty 1

## 2015-10-05 MED ORDER — PIPERACILLIN-TAZOBACTAM 3.375 G IVPB
3.3750 g | Freq: Three times a day (TID) | INTRAVENOUS | Status: DC
  Administered 2015-10-05 – 2015-10-08 (×10): 3.375 g via INTRAVENOUS
  Filled 2015-10-05 (×11): qty 50

## 2015-10-05 MED ORDER — MUPIROCIN 2 % EX OINT
1.0000 "application " | TOPICAL_OINTMENT | Freq: Two times a day (BID) | CUTANEOUS | Status: AC
  Administered 2015-10-05 – 2015-10-09 (×10): 1 via NASAL
  Filled 2015-10-05 (×2): qty 22

## 2015-10-05 MED ORDER — OXCARBAZEPINE 300 MG PO TABS
600.0000 mg | ORAL_TABLET | Freq: Two times a day (BID) | ORAL | Status: DC
  Administered 2015-10-05 – 2015-10-19 (×28): 600 mg
  Filled 2015-10-05 (×32): qty 2

## 2015-10-05 MED ORDER — PNEUMOCOCCAL VAC POLYVALENT 25 MCG/0.5ML IJ INJ: 0.5000 mL | INJECTION | INTRAMUSCULAR | Status: DC | PRN

## 2015-10-05 MED ORDER — PANTOPRAZOLE SODIUM 40 MG IV SOLR
40.0000 mg | INTRAVENOUS | Status: DC
Start: 2015-10-05 — End: 2015-10-13
  Administered 2015-10-05 – 2015-10-13 (×9): 40 mg via INTRAVENOUS
  Filled 2015-10-05 (×9): qty 40

## 2015-10-05 MED ORDER — CHLORHEXIDINE GLUCONATE CLOTH 2 % EX PADS
6.0000 | MEDICATED_PAD | Freq: Every day | CUTANEOUS | Status: AC
  Administered 2015-10-06 – 2015-10-08 (×3): 6 via TOPICAL

## 2015-10-05 MED ORDER — COLLAGENASE 250 UNIT/GM EX OINT
TOPICAL_OINTMENT | Freq: Every day | CUTANEOUS | Status: DC
  Administered 2015-10-05: 1 via TOPICAL
  Administered 2015-10-07 – 2015-10-10 (×4): via TOPICAL
  Administered 2015-10-11: 1 via TOPICAL
  Administered 2015-10-13: 11:00:00 via TOPICAL
  Administered 2015-10-14: 1 via TOPICAL
  Administered 2015-10-15 – 2015-10-17 (×3): via TOPICAL
  Administered 2015-10-20: 1 via TOPICAL
  Filled 2015-10-05 (×3): qty 30

## 2015-10-05 NOTE — Progress Notes (Signed)
ANTIBIOTIC CONSULT NOTE - INITIAL  Pharmacy Consult for Zosyn Indication: Group Gbacteremia/sepsis and suspected aspiration pna, cellulitis   No Known Allergies  Patient Measurements: Height:  (180.3 cm) Weight: (!) 514 lb (233.149 kg) IBW/kg (Calculated) : 75.3 Adjusted Body Weight:   Vital Signs: Temp: 98.3 F (36.8 C) (12/19 0800) Temp Source: Axillary (12/19 0800) BP: 138/69 mmHg (12/19 0827) Pulse Rate: 75 (12/19 0827) Intake/Output from previous day: 12/18 0701 - 12/19 0700 In: 1316.5 [I.V.:666.5; IV Piggyback:650] Out: 1050 [Urine:1000; Emesis/NG output:50] Intake/Output from this shift:    Labs:  Recent Labs  10/03/15 0403 10/04/15 0509 10/04/15 2350 10/05/15 0520  WBC 16.6*  --  13.7* 8.9  HGB 8.3*  --  8.2* 7.3*  PLT 240  --  214 198  CREATININE 1.57* 1.88* 1.86* 1.85*   Estimated Creatinine Clearance: 89.4 mL/min (by C-G formula based on Cr of 1.85). No results for input(s): VANCOTROUGH, VANCOPEAK, VANCORANDOM, GENTTROUGH, GENTPEAK, GENTRANDOM, TOBRATROUGH, TOBRAPEAK, TOBRARND, AMIKACINPEAK, AMIKACINTROU, AMIKACIN in the last 72 hours.   Microbiology: Recent Results (from the past 720 hour(s))  Urine culture     Status: None   Collection Time: 10/02/15 11:36 AM  Result Value Ref Range Status   Specimen Description URINE, RANDOM  Final   Special Requests NONE  Final   Culture MULTIPLE SPECIES PRESENT, SUGGEST RECOLLECTION  Final   Report Status 10/03/2015 FINAL  Final  Blood culture (routine x 2)     Status: None   Collection Time: 10/02/15 11:36 AM  Result Value Ref Range Status   Specimen Description BLOOD RIGHT CHEST  Final   Special Requests BOTTLES DRAWN AEROBIC AND ANAEROBIC  1CC  Final   Culture  Setup Time   Final    GRAM POSITIVE COCCI IN BOTH AEROBIC AND ANAEROBIC BOTTLES CRITICAL RESULT CALLED TO, READ BACK BY AND VERIFIED WITH: MATT MCBANE AT 0315 ON 10/03/15 RWW CONFIRMED BY PMH    Culture   Final    STREPTOCOCCUS GROUP G IN  BOTH AEROBIC AND ANAEROBIC BOTTLES    Report Status 10/05/2015 FINAL  Final   Organism ID, Bacteria STREPTOCOCCUS GROUP G  Final      Susceptibility   Streptococcus group g - MIC*    ERYTHROMYCIN <=0.12 SENSITIVE Sensitive     VANCOMYCIN 0.5 SENSITIVE Sensitive     TRIMETH/SULFA >=320 RESISTANT Resistant     CLINDAMYCIN <=0.25 SENSITIVE Sensitive     LINEZOLID Value in next row Sensitive      SENSITIVE<=2    LEVOFLOXACIN Value in next row Sensitive      SENSITIVE0.5    AMPICILLIN Value in next row Sensitive      SENSITIVE<=0.25    * STREPTOCOCCUS GROUP G  Blood Culture ID Panel (Reflexed)     Status: Abnormal   Collection Time: 10/02/15 11:36 AM  Result Value Ref Range Status   Enterococcus species NOT DETECTED NOT DETECTED Final   Listeria monocytogenes NOT DETECTED NOT DETECTED Final   Staphylococcus species NOT DETECTED NOT DETECTED Final   Staphylococcus aureus NOT DETECTED NOT DETECTED Final   Streptococcus species DETECTED (A) NOT DETECTED Final    Comment: CRITICAL RESULT CALLED AND READ BACK BY MATT MCBANE AT 0315 ON 10/03/15 RWW    Streptococcus agalactiae NOT DETECTED NOT DETECTED Final   Streptococcus pneumoniae NOT DETECTED NOT DETECTED Final   Streptococcus pyogenes NOT DETECTED NOT DETECTED Final   Acinetobacter baumannii NOT DETECTED NOT DETECTED Final   Enterobacteriaceae species NOT DETECTED NOT DETECTED Final  Enterobacter cloacae complex NOT DETECTED NOT DETECTED Final   Escherichia coli NOT DETECTED NOT DETECTED Final   Klebsiella oxytoca NOT DETECTED NOT DETECTED Final   Klebsiella pneumoniae NOT DETECTED NOT DETECTED Final   Proteus species NOT DETECTED NOT DETECTED Final   Serratia marcescens NOT DETECTED NOT DETECTED Final   Haemophilus influenzae NOT DETECTED NOT DETECTED Final   Neisseria meningitidis NOT DETECTED NOT DETECTED Final   Pseudomonas aeruginosa NOT DETECTED NOT DETECTED Final   Candida albicans NOT DETECTED NOT DETECTED Final   Candida  glabrata NOT DETECTED NOT DETECTED Final   Candida krusei NOT DETECTED NOT DETECTED Final   Candida parapsilosis NOT DETECTED NOT DETECTED Final   Candida tropicalis NOT DETECTED NOT DETECTED Final   Carbapenem resistance NOT DETECTED NOT DETECTED Final   Methicillin resistance NOT DETECTED NOT DETECTED Final   Vancomycin resistance NOT DETECTED NOT DETECTED Final  Blood culture (routine x 2)     Status: None (Preliminary result)   Collection Time: 10/02/15 11:40 AM  Result Value Ref Range Status   Specimen Description BLOOD RIGHT AC  Final   Special Requests BOTTLES DRAWN AEROBIC AND ANAEROBIC  1CC  Final   Culture  Setup Time   Final    GRAM POSITIVE COCCI AEROBIC BOTTLE ONLY CRITICAL RESULT CALLED TO, READ BACK BY AND VERIFIED WITH: MATT MCBANE AT 0315 ON 10/03/15 RWW CONFIRMED BY PMH    Culture STREPTOCOCCUS GROUP G AEROBIC BOTTLE ONLY   Final   Report Status PENDING  Incomplete   Organism ID, Bacteria STREPTOCOCCUS GROUP G  Final      Susceptibility   Streptococcus group g - MIC*    ERYTHROMYCIN <=0.12 SENSITIVE Sensitive     VANCOMYCIN 0.5 SENSITIVE Sensitive     TRIMETH/SULFA 160 RESISTANT Resistant     CLINDAMYCIN <=0.25 SENSITIVE Sensitive     LINEZOLID Value in next row Sensitive      SENSITIVE<=2    LEVOFLOXACIN Value in next row Sensitive      SENSITIVE0.5    AMPICILLIN Value in next row Sensitive      SENSITIVE<=0.25    * STREPTOCOCCUS GROUP G  MRSA PCR Screening     Status: Abnormal   Collection Time: 10/04/15 10:24 PM  Result Value Ref Range Status   MRSA by PCR POSITIVE (A) NEGATIVE Final    Comment:        The GeneXpert MRSA Assay (FDA approved for NASAL specimens only), is one component of a comprehensive MRSA colonization surveillance program. It is not intended to diagnose MRSA infection nor to guide or monitor treatment for MRSA infections. RESULT CALLED TO, READ BACK BY AND VERIFIED WITH: STOWE,S RN 0023 10/05/15 MITCHELL,L     Medical  History: Past Medical History  Diagnosis Date  . Asthma   . Hypertension   . Hyperlipemia   . Diabetes mellitus without complication (HCC)   . Hypothyroidism   . Bipolar disorder (HCC)   . Sleep apnea, obstructive   . Depression     Medications:  Prescriptions prior to admission  Medication Sig Dispense Refill Last Dose  . cefTRIAXone 2 g in dextrose 5 % 50 mL Inject 2 g into the vein daily.     Marland Kitchen. enoxaparin (LOVENOX) 40 MG/0.4ML injection Inject 0.4 mLs (40 mg total) into the skin every 12 (twelve) hours. 0 Syringe    . Fluticasone-Salmeterol (ADVAIR) 250-50 MCG/DOSE AEPB Inhale 1 puff into the lungs 2 (two) times daily.   10/01/2015 at Unknown time  .  gabapentin (NEURONTIN) 300 MG capsule Take 300 mg by mouth 4 (four) times daily.   10/01/2015 at Unknown time  . levothyroxine (SYNTHROID, LEVOTHROID) 75 MCG tablet Take 75 mcg by mouth daily before breakfast.   10/01/2015 at Unknown time  . loratadine (CLARITIN) 10 MG tablet Take 10 mg by mouth daily.   10/01/2015 at Unknown time  . montelukast (SINGULAIR) 10 MG tablet Take 10 mg by mouth daily.   10/01/2015 at Unknown time  . nystatin (MYCOSTATIN/NYSTOP) 100000 UNIT/GM POWD Apply topically 3 (three) times daily.   10/01/2015 at Unknown time  . omeprazole (PRILOSEC) 20 MG capsule Take 20 mg by mouth daily.   10/01/2015 at Unknown time  . oxcarbazepine (TRILEPTAL) 600 MG tablet Take 600 mg by mouth 2 (two) times daily.   10/01/2015 at Unknown time  . potassium chloride SA (K-DUR,KLOR-CON) 20 MEQ tablet Take 20 mEq by mouth daily.   10/01/2015 at Unknown time  . tiotropium (SPIRIVA) 18 MCG inhalation capsule Place 18 mcg into inhaler and inhale daily.   10/01/2015 at Unknown time  . torsemide (DEMADEX) 20 MG tablet Take 20 mg by mouth daily.   10/01/2015 at Unknown time  . vancomycin 2,000 mg in sodium chloride 0.9 % 500 mL Inject 2,000 mg into the vein every 18 (eighteen) hours.     . verapamil (CALAN-SR) 120 MG CR tablet Take 120 mg by  mouth at bedtime.   10/01/2015 at Unknown time  . ziprasidone (GEODON) 60 MG capsule Take 60 mg by mouth 2 (two) times daily with a meal.   10/01/2015 at Unknown time  . zolpidem (AMBIEN) 10 MG tablet Take one tablet by mouth every night at bedtime as needed for rest 10 tablet 0 prn at prn   Assessment: 54yo male tx'd from [facility] to ICU for acute on chronic respiratory failure, started on vanc for cellulitis at [facility], now w/ GPC and GBS on blood cx, sensitivities pending, to broaden ABX coverage.   Infectious Disease: Group Gbacteremia/sepsis and suspected aspiration pna, cellulitis (panus and leg infections) , Tmax 103. Currently afebrile. WBC down 8.9. PC 12.7, BC revealed strep group h in 2/2  Clinda 12/19>12/19 Rocephin 12/71>>12/19 Zosyn 12/19>> Vanco 12/18>>  Goal of Therapy:  Vancomycin trough level 15-20 mcg/ml  Plan:  Vancomycin  IV q12h (level 12/20) Zosyn 3.375g IV q8hr.     Gaelle Adriance S. Merilynn Finland, PharmD, [facility] Clinical Staff Pharmacist Pager [phone]  Misty Stanley Stillinger 10/05/2015,9:50 AM

## 2015-10-05 NOTE — Consult Note (Addendum)
WOC wound consult note Consult requested for abd pannus wound and BLE.  [facility], but has become critically ill and [facility]. 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  Wound type:Full thickness to left inner foot near heel Pressure Ulcer POA: This does not appear to be a pressure ulcer, but was present on admission Measurement:Left plantar foot 5 cm x 4 cm x .5 cm ulceration. Dark brown crusted callous surrounding wound bed.  Inner wound is yellow dry slough, strong odor, mod amt yellow drainage.  Periwound: Generalized erythema and edema to bilateral lower extremities, no weeping. Right calf with partial thickness skin tear; 4X3X.1cm, pink and moist. Inner pannus skin folds are red, macerated, with partial thickness skin loss related to intertrigo. Left lower abd with full thickness wound; 14X2,5X.3cm, 80% red, 20% yellow, mod amt yellow drainage, no odor. Dressing procedure/placement/frequency: Leave Una boots off at this time, since pt is intubated and legs are elevated and he will remain on bedrest until stable.  Begin Santyl for chemical debridement to left foot wound. Foam dressing to lower abd weeping area with patchy area of partial thickness skin loss; approx 4X4X.1cm. Interdry Ag to abdominal pannus skin folds to treat appearance consistent with intertrigo.This provides wicking to absorb moisture away from skin and antimicrobial benefits. Instructions provided for bedside nurse use.  Optimal plan of care is to leave in place for 5 days.  Aquacel to absorb drainage and provide antimicrobial benefits to left lower abd full thickness wound.  Pt is on a bariatric low air loss bed.  Prevalon boot to reduce pressure to left foot  wound. Please re-consult if further assistance is needed.  Thank-you,  Cammie Mcgeeawn Torien Ramroop MSN, RN, CWOCN, ElmoWCN-AP, CNS [phone]

## 2015-10-05 NOTE — Progress Notes (Signed)
Initial Nutrition Assessment  DOCUMENTATION CODES:   Morbid obesity  INTERVENTION:  Recommend initiating tube feedings within 24 hours: Initiate Vital High protein @ 10 ml/hr via OGT.   60 ml Prostat QID.    Tube feeding regimen provides 1040 kcal, 141 grams of protein (90% of estimated needs), and 202 ml of H2O.  TF plus propofol (@42ml /hr) provides 2149 kcal (109% of estimated needs).  If propofol is discontinued, Increase rate of Vital High Protein by 10 ml/hr every 4 hours to goal of 75 ml/hr and discontinue pro-stat. This will provide 1800 kcal, 158 grams of protein, and 1512 ml of water.    NUTRITION DIAGNOSIS:   Inadequate oral intake related to inability to eat as evidenced by NPO status.   GOAL:   Provide needs based on ASPEN/SCCM guidelines   MONITOR:   TF tolerance, Vent status, Labs, Skin, I & O's  REASON FOR ASSESSMENT:   Ventilator    ASSESSMENT:   54 year old, 514 lb male with panus and leg infections, sepsis. Hypoxic resp failure due to ALI. Intubated 12/18.   Pt awake and alert at time of visit, on propofol. OGT to suction.   Patient is currently intubated on ventilator support MV: 15.1 L/min Temp (24hrs), Avg:100.5 F (38.1 C), Min:98.1 F (36.7 C), Max:103 F (39.4 C)  Propofol:  42 ml/hr (provides 1109 kcal per 24 hours)   Diet Order:  Diet NPO time specified  Skin:  Wound (see comment) (MSAD on abdomen and leg (cellulitis); laceration on abdomen)  Last BM:  PTA  Height:   Ht Readings from Last 1 Encounters:  10/04/15 5\' 11"  (1.803 m)    Weight:   Wt Readings from Last 1 Encounters:  10/05/15 514 lb (233.149 kg)    Ideal Body Weight:  78.2 kg  BMI:  Body mass index is 71.72 kg/(m^2).  Estimated Nutritional Needs:   Kcal:  1720-1955  Protein:  156-196 grams  Fluid:  1.7-1.9 L/day  EDUCATION NEEDS:   No education needs identified at this time  Dorothea Ogleeanne Johnasia Liese RD, LDN Inpatient Clinical Dietitian Pager:  [phone] After Hours Pager: [phone]

## 2015-10-05 NOTE — Care Management Note (Signed)
Case Management Note  Patient Details  Name: Adrian [name]. MRN: [id number] Date of Birth: February 02, 1961  Subjective/Objective:    Adm w resp failure,vent                Action/Plan: lives w fam   Expected Discharge Date:                  Expected Discharge Plan:     In-House Referral:     Discharge planning Services     Post Acute Care Choice:    Choice offered to:     DME Arranged:    DME Agency:     HH Arranged:    HH Agency:     Status of Service:     Medicare Important Message Given:    Date Medicare IM Given:    Medicare IM give by:    Date Additional Medicare IM Given:    Additional Medicare Important Message give by:     If discussed at Long Length of Stay Meetings, dates discussed:    Additional Comments: ur review done  Hanley HaysDowell, [name], RN 10/05/2015, 9:46 AM

## 2015-10-05 NOTE — Progress Notes (Signed)
eLink Physician-Brief Progress Note Patient Name: Adrian DiegoCarlton B Hum Jr. DOB: Feb 23, 1961 MRN: [id number]   Date of Service  10/05/2015  HPI/Events of Note  ABG on 100%/PRVC 22/TV 450/P 10 = 7.25/63.7/278.0/28.2  eICU Interventions  Will order: 1. Increase PRVC rate to 26. 2. Re-check ABG at 5 AM. 3. Wean FiO2 as tolerated.     Intervention Category Major Interventions: Acid-Base disturbance - evaluation and management Intermediate Interventions: Respiratory distress - evaluation and management  Aston Lawhorn Eugene 10/05/2015, 2:06 AM

## 2015-10-05 NOTE — Progress Notes (Signed)
Positive PCR MRSA swab called. Standing orders initiated per protocol. Contact isolation continued.

## 2015-10-05 NOTE — Progress Notes (Signed)
Called Dr. Nevin BloodgoodSianani and [facility] back to room to check patients status.  Pt working very hard to breath using abdominal muscles.  Dr. Nevin BloodgoodSianani stated he would [facility] since our Unit could not take patient due to [facility].  Respiratory at bedside placed patient on Bi-pap per Dr. Nevin BloodgoodSianani verbal order.

## 2015-10-05 NOTE — Progress Notes (Signed)
eLink Physician-Brief Progress Note Patient Name: Adrian DiegoCarlton B Bohnenkamp Jr. DOB: [birth year] MRN: [id number]   Date of Service  10/05/2015  HPI/Events of Note  Notified of need for stress ulcer prophylaxis.   eICU Interventions  Will order Protonix IV.     Intervention Category Intermediate Interventions: Best-practice therapies (e.g. DVT, beta blocker, etc.)  Braeleigh Pyper Eugene 10/05/2015, 2:39 AM

## 2015-10-05 NOTE — Progress Notes (Signed)
Received ref from dr byrum for hhc vs ltac on pt. Will follow for hhc needs and will get both ltac to ck for elidg if needed. Will cont to follow and assist as pt progresses.

## 2015-10-05 NOTE — Progress Notes (Signed)
RT called to pt's room to assist with changing beds. Pt's vitals remained stable throughout. ETT position remained stable.  Rt will continue to monitor.

## 2015-10-05 NOTE — Procedures (Signed)
Arterial Catheter Insertion Procedure Note Adrian [name]. [id number] 1961/08/14  Procedure: Insertion of Arterial Catheter  Indications: Blood pressure monitoring  Procedure Details Consent: Unable to obtain consent because of emergent medical necessity. Time Out: Verified patient identification, verified procedure, site/side was marked, verified correct patient position, special equipment/implants available, medications/allergies/relevent history reviewed, required imaging and test results available.  Performed  Maximum sterile technique was used including antiseptics, cap, gloves, gown, hand hygiene, mask and sheet. Skin prep: Chlorhexidine; local anesthetic administered 20 gauge catheter was inserted into right radial artery using the Seldinger technique.  Evaluation Blood flow good; BP tracing good. Complications: No apparent complications.   Adrian [name], Adrian [name] 10/05/2015

## 2015-10-05 NOTE — Progress Notes (Signed)
PULMONARY / CRITICAL CARE MEDICINE   Name: Adrian [name]. MRN: [id number] DOB: [birth year]    ADMISSION DATE:  10/04/2015  REFERRING MD:  [facility]  CHIEF COMPLAINT: SOB  HISTORY OF PRESENT ILLNESS:   54 yo who presented  to [facility] 12/16 with SOB, he is 527 lbs and has OSA, also has drainage from left leg and panus consistent with cellulitis. He was treated with abx and BC revealed strep group h in 2/2. 12/18 he became hypoxic, ams and with general decline. [facility] did not have adequate nursing staff therefore he is being transported to [facility] ICU. Currently he is on NIMVS bu with bacteremia/sepsis and suspected aspiration pna he will mos tlikely need intubation. Note he bipolar and on sedatives which may be adding to his altered mental status. We will check 12 lead and CE for completeness along with pro calcitonin panel. He is on adequate abx.  SUBJECTIVE:  Intubated successfully last pm, hemodynamically stable Indicates discomfort from ETT  VITAL SIGNS: BP 138/69 mmHg  Pulse 75  Temp(Src) 98.3 F (36.8 C) (Axillary)  Resp 18  Ht  (1.803 m)  Wt 233.149 kg (514 lb)  BMI 71.72 kg/m2  SpO2 100%  HEMODYNAMICS:    VENTILATOR SETTINGS: Vent Mode:  [-] PRVC FiO2 (%):  [45 %-100 %] 60 % Set Rate:  [10 bmp-26 bmp] 26 bmp Vt Set:  [450 mL] 450 mL PEEP:  [10 cmH20] 10 cmH20 Plateau Pressure:  [20 cmH20-25 cmH20] 23 cmH20  INTAKE / OUTPUT: I/O last 3 completed shifts: In: 1316.5 [I.V.:666.5; IV Piggyback:650] Out: 1050 [Urine:1000; Emesis/NG output:50]  PHYSICAL EXAMINATION: General:  MO male on MV Neuro:  Wakes easily to voice, moves all ext HEENT:  ETT in place, unable to assess neck,  Cardiovascular:  Very distant, S2 only thing heard Lungs:  Decreased air movement. Very distant Abdomen:  Obese panus dressing in place, panus wound is full thickness, 16x2x1.5 cm with yellow drainage. Musculoskeletal:  intact Skin:  Leg dressing in place, L heel wound    LABS:  BMET  Recent Labs Lab 10/03/15 0403 10/04/15 0509 10/04/15 2350 10/05/15 0520  NA 140  --  137 139  K 4.2  --  4.4 4.3  CL 102  --  101 101  CO2 26  --  26 25  BUN 27*  --  30* 34*  CREATININE 1.57* 1.88* 1.86* 1.85*  GLUCOSE 113*  --  156* 168*    Electrolytes  Recent Labs Lab 10/03/15 0403 10/04/15 2350 10/05/15 0520  CALCIUM 8.0* 7.9* 7.6*  MG  --  2.7* 2.5*  PHOS  --  6.8* 7.2*    CBC  Recent Labs Lab 10/03/15 0403 10/04/15 2350 10/05/15 0520  WBC 16.6* 13.7* 8.9  HGB 8.3* 8.2* 7.3*  HCT 26.4* 26.2* 24.1*  PLT 240 214 198    Coag's  Recent Labs Lab 10/04/15 2350  APTT 44*  INR 1.23    Sepsis Markers  Recent Labs Lab 10/02/15 1139 10/02/15 1841 10/04/15 2350  LATICACIDVEN 3.4* 2.9* 0.9  PROCALCITON  --   --  12.16    ABG  Recent Labs Lab 10/04/15 2202 10/05/15 0136 10/05/15 0415  PHART 7.399 7.253* 7.316*  PCO2ART 47.9* 63.7* 48.0*  PO2ART 136.0* 278.0* 205*    Liver Enzymes  Recent Labs Lab 10/02/15 1139 10/04/15 2350  AST 28 106*  ALT 26 95*  ALKPHOS 83 79  BILITOT 0.7 0.5  ALBUMIN 3.5 2.5*    Cardiac Enzymes  Recent  Labs Lab 10/03/15 0403 10/04/15 2350 10/05/15 0520  TROPONINI 0.28* 0.13* 0.07*    Glucose  Recent Labs Lab 10/02/15 1559 10/05/15 0118 10/05/15 0432 10/05/15 0827  GLUCAP 111* 155* 155* 165*    Imaging Dg Chest 1 View  10/04/2015  CLINICAL DATA:  Shortness of breath, history hypertension, asthma, hyperlipidemia, diabetes mellitus EXAM: CHEST 1 VIEW COMPARISON:  10/02/2015 FINDINGS: Rotated to the LEFT. Enlargement of cardiac silhouette. Diffuse BILATERAL airspace infiltrates RIGHT greater than LEFT question asymmetric edema versus infection. No gross pleural effusion or pneumothorax. Osseous structures inadequately evaluated. IMPRESSION: Enlargement of cardiac silhouette. Asymmetric pulmonary infiltrates RIGHT greater than LEFT new since previous exam question asymmetric  edema versus pneumonia. Electronically Signed   By: Ulyses SouthwardMark  Boles M.D.   On: 10/04/2015 17:34   Dg Chest Port 1 View  10/05/2015  CLINICAL DATA:  54 year old male with respiratory failure EXAM: PORTABLE CHEST 1 VIEW COMPARISON:  Earlier radiograph dated 10/04/2015 FINDINGS: There has been interval placement of an endotracheal tube with tip approximately 2 cm above the carina. Recommend retraction by approximately 4 cm for optimal positioning. An enteric tube is partially visualized with tip beyond the image cut off. Bilateral airspace opacities, right greater than left again noted. There has been slight interval improvement of the aeration of the right lower lung field. Stable cardiomegaly. The osseous structures are grossly unremarkable. IMPRESSION: Endotracheal tube above the carina. Electronically Signed   By: Elgie CollardArash  Radparvar M.D.   On: 10/05/2015 00:16     STUDIES:    CULTURES: 12/16 bc [facility]) >> Strep group g 2 of 2>> 12/16 UC [facility]) >> multiple species  ANTIBIOTICS: ceftriaxone 12/17 >> 12/19 vanco 12/18 >> clinda 12/19 >> 12/19 zosyn 12/19 >>   SIGNIFICANT EVENTS: [facility] 12/18 [facility]  LINES/TUBES: ETT 12/18 >>   DISCUSSION: 527 lb male with panus and leg infections, sepsis. Hypoxic resp failure due to ALI superimposed on OHS +/- sedating meds. Intubated 12/18  ASSESSMENT / PLAN:  PULMONARY A: ARDS Suspected aspiration event Hx of asthma  P:   MV with ARDS protocol, wean Fio2 and PEEP as able Diuresis if he can tolerate, note renal dysfxn  INFECTIOUS A:   Cellulitis of panus  Group G strep bacteremia HCAP / aspiration PNA P:   Change ceftriaxone and clinda to zosyn given suspected HCAP / aspiration and in case pseudomonal involvement at pannus (doubt) WOC consult > dressing changes and other intervention per their recs Will need TTE at some point  CARDIOVASCULAR A:  HTN P:  Hold  antihypertensives as normotensive on sedation.  Will need TTE this admission given Strep bacteremia Defer CVC placement for now due to bacteremia, consider PICC on 12/20  RENAL A:   Acute renal failure P:   Follow BMP and UOP  GASTROINTESTINAL A:   GI protection Mild transaminitis, ? etiology P:   PPI Follow LFT  HEMATOLOGIC A:   No acute issue P:  DVT protection with heparin   ENDOCRINE A:   DM P:   SSI  NEUROLOGIC A:   Sedation for MV Bipolar disorder P:   RASS goal: -1 to 0 Propofol > may need to change given his triglycerides > 300 Bipolar medications ordered > trileptal and geodon   FAMILY  - Updates:  RN was able to reach mother and sister early am 12/19. None present here today  - Inter-disciplinary family meet or Palliative Care meeting due  by:  12/25  Independent CC time 40 minutes  Levy Pupa, MD, PhD 10/05/2015, 9:24 AM [place] [facility] [phone] or if no answer [phone]

## 2015-10-05 NOTE — Progress Notes (Signed)
[facility] and gave report to RN.  [facility] notified and waiting on call for report to [facility] RN.

## 2015-10-05 NOTE — Progress Notes (Signed)
Pt on Bipap vitals stable.  [facility] Dr. Cherlynn KaiserSainani who stated we only have a code bed available.  Waiting for [facility] to pick up patient.

## 2015-10-05 NOTE — H&P (Signed)
PULMONARY / CRITICAL CARE MEDICINE   Name: Adrian [name]. MRN: [id number] DOB: Apr 24, 1961    ADMISSION DATE:  10/04/2015   REFERRING MD:  [facility]  CHIEF COMPLAINT: SOB  HISTORY OF PRESENT ILLNESS:   54 yo who presented  to [facility] 12/16 with SOB, he is 527 lbs and has OSA, also has drainage from left leg and panus consistent with cellulitis. He was treated with abx and BC revealed strep group h in 2/2. 12/18 he became hypoxic, ams and with general decline. [facility] did not have adequate nursing staff therefore he is being transported to [facility] ICU. Currently he is on NIMVS bu with bacteremia/sepsis and suspected aspiration pna he will mos tlikely need intubation. Note he bipolar and on sedatives which may be adding to his altered mental status. We will check 12 lead and CE for completeness along with pro calcitonin panel. He is on adequate abx.  PAST MEDICAL HISTORY :  He  has a past medical history of Asthma; Hypertension; Hyperlipemia; Diabetes mellitus without complication (HCC); Hypothyroidism; Bipolar disorder (HCC); Sleep apnea, obstructive; and Depression.  PAST SURGICAL HISTORY: He  has past surgical history that includes Tonsillectomy and bone removed.  No Known Allergies  No current facility-administered medications on file prior to encounter.   Current Outpatient Prescriptions on File Prior to Encounter  Medication Sig  . cefTRIAXone 2 g in dextrose 5 % 50 mL Inject 2 g into the vein daily.  Marland Kitchen enoxaparin (LOVENOX) 40 MG/0.4ML injection Inject 0.4 mLs (40 mg total) into the skin every 12 (twelve) hours.  . Fluticasone-Salmeterol (ADVAIR) 250-50 MCG/DOSE AEPB Inhale 1 puff into the lungs 2 (two) times daily.  Marland Kitchen gabapentin (NEURONTIN) 300 MG capsule Take 300 mg by mouth 4 (four) times daily.  Marland Kitchen levothyroxine (SYNTHROID, LEVOTHROID) 75 MCG tablet Take 75 mcg by mouth daily before breakfast.  . loratadine (CLARITIN) 10 MG tablet Take 10 mg by mouth daily.  . montelukast (SINGULAIR)  10 MG tablet Take 10 mg by mouth daily.  Marland Kitchen nystatin (MYCOSTATIN/NYSTOP) 100000 UNIT/GM POWD Apply topically 3 (three) times daily.  Marland Kitchen omeprazole (PRILOSEC) 20 MG capsule Take 20 mg by mouth daily.  Marland Kitchen oxcarbazepine (TRILEPTAL) 600 MG tablet Take 600 mg by mouth 2 (two) times daily.  . potassium chloride SA (K-DUR,KLOR-CON) 20 MEQ tablet Take 20 mEq by mouth daily.  Marland Kitchen tiotropium (SPIRIVA) 18 MCG inhalation capsule Place 18 mcg into inhaler and inhale daily.  Marland Kitchen torsemide (DEMADEX) 20 MG tablet Take 20 mg by mouth daily.  . vancomycin 2,000 mg in sodium chloride 0.9 % 500 mL Inject 2,000 mg into the vein every 18 (eighteen) hours.  . verapamil (CALAN-SR) 120 MG CR tablet Take 120 mg by mouth at bedtime.  . ziprasidone (GEODON) 60 MG capsule Take 60 mg by mouth 2 (two) times daily with a meal.  . zolpidem (AMBIEN) 10 MG tablet Take one tablet by mouth every night at bedtime as needed for rest    FAMILY HISTORY:  His has no family status information on file.   SOCIAL HISTORY: He  reports that he has never smoked. He does not have any smokeless tobacco history on file. He reports that he does not drink alcohol or use illicit drugs.  REVIEW OF SYSTEMS:   na  SUBJECTIVE:    VITAL SIGNS: BP 130/82 mmHg  Pulse 83  Temp(Src) 98.9 F (37.2 C) (Axillary)  Resp 31  Ht  (1.803 m)  Wt 514 lb (233.149 kg)  BMI 71.72  kg/m2  SpO2 98%  HEMODYNAMICS:    VENTILATOR SETTINGS: Vent Mode:  [-] PRVC FiO2 (%):  [45 %-100 %] 100 % Set Rate:  [10 bmp-22 bmp] 22 bmp Vt Set:  [450 mL] 450 mL PEEP:  [10 cmH20] 10 cmH20 Plateau Pressure:  [25 cmH20] 25 cmH20  INTAKE / OUTPUT:    PHYSICAL EXAMINATION: General:  MO male on NIMVS. Increased wob Neuro:  Somewhat somulent HEENT:  FM in place , no neck Cardiovascular: HSD Lungs:  Decreased air movement. Abd chest wall paradoxus noted. Abdomen:  Obese panus dressing in place, panus wound is full thickness, 16x2x1.5 cm with yellow  drainage. Musculoskeletal:  intact Skin:  Leg dressing in place  LABS:  BMET  Recent Labs Lab 10/02/15 1139 10/02/15 1841 10/03/15 0403 10/04/15 0509  NA 136  --  140  --   K 4.7  --  4.2  --   CL 97*  --  102  --   CO2 26  --  26  --   BUN 29*  --  27*  --   CREATININE 1.49* 1.61* 1.57* 1.88*  GLUCOSE 131*  --  113*  --     Electrolytes  Recent Labs Lab 10/02/15 1139 10/03/15 0403  CALCIUM 8.8* 8.0*    CBC  Recent Labs Lab 10/02/15 1139 10/02/15 1841 10/03/15 0403  WBC 30.9* 23.9* 16.6*  HGB 9.6* 8.2* 8.3*  HCT 30.7* 25.0* 26.4*  PLT 373 266 240    Coag's No results for input(s): APTT, INR in the last 168 hours.  Sepsis Markers  Recent Labs Lab 10/02/15 1139 10/02/15 1841  LATICACIDVEN 3.4* 2.9*    ABG  Recent Labs Lab 10/04/15 1200 10/04/15 2202  PHART 7.47* 7.399  PCO2ART 42 47.9*  PO2ART 55* 136.0*    Liver Enzymes  Recent Labs Lab 10/02/15 1139  AST 28  ALT 26  ALKPHOS 83  BILITOT 0.7  ALBUMIN 3.5    Cardiac Enzymes  Recent Labs Lab 10/02/15 2027 10/03/15 0014 10/03/15 0403  TROPONINI 0.23* 0.25* 0.28*    Glucose  Recent Labs Lab 10/02/15 1559  GLUCAP 111*    Imaging Dg Chest 1 View  10/04/2015  CLINICAL DATA:  Shortness of breath, history hypertension, asthma, hyperlipidemia, diabetes mellitus EXAM: CHEST 1 VIEW COMPARISON:  10/02/2015 FINDINGS: Rotated to the LEFT. Enlargement of cardiac silhouette. Diffuse BILATERAL airspace infiltrates RIGHT greater than LEFT question asymmetric edema versus infection. No gross pleural effusion or pneumothorax. Osseous structures inadequately evaluated. IMPRESSION: Enlargement of cardiac silhouette. Asymmetric pulmonary infiltrates RIGHT greater than LEFT new since previous exam question asymmetric edema versus pneumonia. Electronically Signed   By: Ulyses Southward M.D.   On: 10/04/2015 17:34   Dg Chest Port 1 View  10/05/2015  CLINICAL DATA:  54 year old male with  respiratory failure EXAM: PORTABLE CHEST 1 VIEW COMPARISON:  Earlier radiograph dated 10/04/2015 FINDINGS: There has been interval placement of an endotracheal tube with tip approximately 2 cm above the carina. Recommend retraction by approximately 4 cm for optimal positioning. An enteric tube is partially visualized with tip beyond the image cut off. Bilateral airspace opacities, right greater than left again noted. There has been slight interval improvement of the aeration of the right lower lung field. Stable cardiomegaly. The osseous structures are grossly unremarkable. IMPRESSION: Endotracheal tube above the carina. Electronically Signed   By: Elgie Collard M.D.   On: 10/05/2015 00:16     STUDIES:    CULTURES: 12/16 bc>>GPCstrep group G>>2/2>> 12/16 UC>>MS  ANTIBIOTICS: 12/17 roc>> 12/18 vanc>>  SIGNIFICANT EVENTS: [facility] 12/18 [facility].  LINES/TUBES:   DISCUSSION: 527 lb male with panus and leg infectious, sepsis. Hypoxic resp failure , [facility]. Suspect he will need intubation.  ASSESSMENT / PLAN:  PULMONARY A: MO with hypoxia CxR cw with early ARDS Suspected aspiration Hx of asthma  P:   Bipap but suspect he will need intubation O2 as needed Diuresis as tolerated but elevated creatine noted  CARDIOVASCULAR A:  HTN Presumed sepsis  P:  Hold antihypertensives Check pro calcitonin Check lactic acid  Check CE and 12 lead  RENAL Lab Results  Component Value Date   CREATININE 1.88* 10/04/2015   CREATININE 1.57* 10/03/2015   CREATININE 1.61* 10/02/2015   CREATININE 1.08 04/26/2014   CREATININE 1.01 04/25/2014   CREATININE 2.15* 10/04/2013    A:   Renal insuff P:   Follow creatine   GASTROINTESTINAL A:   GI protection P:   PPI  HEMATOLOGIC A:   No acute issue P:  DVT protection with heparin  INFECTIOUS A:   Presumed cellulitis of panus   Suspected Hcap from ? aspiration P:   See flow sheet WOC consult put in Wet to dry dressing.  ENDOCRINE CBG (last 3)   Recent Labs  10/02/15 1559  GLUCAP 111*     A:   DM P:   SSI  NEUROLOGIC A:   Somewhat somulent but wakes up Bipolar P:   RASS goal: 0 Hold sedation Check ABG for hypercarbic state Bipolar medications   FAMILY  - Updates: Unable to contact family for update.  - Inter-disciplinary family meet or Palliative Care meeting due by:  12/25   Brett CanalesSteve Minor ACNP [facility] Pager [phone] till 3 pm If no answer page [phone]   PCCM Attending Note: Patient seen and examined with nurse practitioner. Please refer to his H&P which I reviewed in detail. 54 year old morbidly obese male with sepsis from cellulitis of panus. Patient has worsening acute hypoxic respiratory failure secondary to ARDS. Attempted to contact the patient's mother but unsuccessful this evening. With the patient's worsening multisystem organ failure his potential for further clinical decompensation is high. He was intubated emergently.  A/P: 1. Acute hypoxic respiratory failure/ARDS: ABG postintubation pending. Continuing 6 mL/kg of ideal body weight tidal volume for ventilation. 2. Sepsis: Secondary to bacteremia from cellulitis. Continue broad-spectrum antibiotics coverage with clindamycin, vancomycin, & Rocephin. Further antibiotic adjustment depending upon sensitivities. Procalcitonin pending. 3. Acute renal failure: Trending urine output along with daily BUN/creatinine. 4. Delirium with bipolar disorder: Holding sedating medications at this time. Propofol drip. Fentanyl IV when necessary. 5. Diabetes mellitus: Accu-Cheks every 4 hours with sliding scale insulin coverage. 6. Prophylaxis: Heparin subcutaneous every 8 hours & Protonix IV daily. 7. Cellulitis: Wound care consulted.  I spent a total of 37 minutes of critical care time this evening independent of procedures & nurse  practitioner time caring for the patient and reviewing the patient's electronic medical record.  Donna ChristenJennings E. Jamison NeighborNestor, M.D. [facility] Pager:  [phone] After 3pm or if no response, call [phone] 10/05/2015, 12:26 AM

## 2015-10-05 NOTE — Procedures (Signed)
Intubation Procedure Note Lieutenant DiegoCarlton B Sneath Jr. [id number] 1961/07/03  Procedure: Intubation Indications: Airway protection and maintenance  Procedure Details Consent: Unable to obtain consent because of altered level of consciousness. Unable to contact family Time Out: Verified patient identification, verified procedure, site/side was marked, verified correct patient position, special equipment/implants available, medications/allergies/relevent history reviewed, required imaging and test results available.  Performed  Maximum sterile technique was used including antiseptics, cap, gloves, gown, hand hygiene, mask and sheet.  MAC and 4 Glide    Evaluation Hemodynamic Status: BP stable throughout; O2 sats: stable throughout Patient's Current Condition: stable Complications: No apparent complications Patient did tolerate procedure well. Chest X-ray ordered to verify placement.  CXR: pending.   Performed by Devra DoppSteve Minor ACNP under the supervision of Dr. Ether GriffinsNestor  Arlynn Mcdermid, AGACNP-BC [facility] Pager [phone] or [phone]  10/05/2015 12:34 AM

## 2015-10-06 ENCOUNTER — Inpatient Hospital Stay (HOSPITAL_COMMUNITY): Payer: Medicare Other

## 2015-10-06 DIAGNOSIS — J80 Acute respiratory distress syndrome: Secondary | ICD-10-CM | POA: Diagnosis present

## 2015-10-06 DIAGNOSIS — J9600 Acute respiratory failure, unspecified whether with hypoxia or hypercapnia: Secondary | ICD-10-CM

## 2015-10-06 LAB — CBC
HEMATOCRIT: 22.6 % — AB (ref 39.0–52.0)
Hemoglobin: 7.3 g/dL — ABNORMAL LOW (ref 13.0–17.0)
MCH: 28.6 pg (ref 26.0–34.0)
MCHC: 32.3 g/dL (ref 30.0–36.0)
MCV: 88.6 fL (ref 78.0–100.0)
Platelets: 206 10*3/uL (ref 150–400)
RBC: 2.55 MIL/uL — ABNORMAL LOW (ref 4.22–5.81)
RDW: 16.4 % — ABNORMAL HIGH (ref 11.5–15.5)
WBC: 9.3 10*3/uL (ref 4.0–10.5)

## 2015-10-06 LAB — URINE CULTURE: CULTURE: NO GROWTH

## 2015-10-06 LAB — BLOOD GAS, ARTERIAL
ACID-BASE EXCESS: 0.9 mmol/L (ref 0.0–2.0)
BICARBONATE: 25.4 meq/L — AB (ref 20.0–24.0)
Drawn by: 41977
FIO2: 0.6
LHR: 26 {breaths}/min
O2 SAT: 99.2 %
PATIENT TEMPERATURE: 98.6
PCO2 ART: 43.5 mmHg (ref 35.0–45.0)
PH ART: 7.384 (ref 7.350–7.450)
PO2 ART: 220 mmHg — AB (ref 80.0–100.0)
TCO2: 26.7 mmol/L (ref 0–100)
VT: 450 mL

## 2015-10-06 LAB — BASIC METABOLIC PANEL
Anion gap: 10 (ref 5–15)
BUN: 46 mg/dL — AB (ref 6–20)
CHLORIDE: 106 mmol/L (ref 101–111)
CO2: 26 mmol/L (ref 22–32)
CREATININE: 1.98 mg/dL — AB (ref 0.61–1.24)
Calcium: 7.8 mg/dL — ABNORMAL LOW (ref 8.9–10.3)
GFR calc Af Amer: 42 mL/min — ABNORMAL LOW (ref >60)
GFR calc non Af Amer: 37 mL/min — ABNORMAL LOW (ref >60)
Glucose, Bld: 139 mg/dL — ABNORMAL HIGH (ref 65–99)
Potassium: 3.8 mmol/L (ref 3.5–5.1)
SODIUM: 142 mmol/L (ref 135–145)

## 2015-10-06 LAB — POCT I-STAT 3, ART BLOOD GAS (G3+)
ACID-BASE EXCESS: 1 mmol/L (ref 0.0–2.0)
BICARBONATE: 28.1 meq/L — AB (ref 20.0–24.0)
O2 SAT: 96 %
PCO2 ART: 58.8 mmHg — AB (ref 35.0–45.0)
PO2 ART: 97 mmHg (ref 80.0–100.0)
TCO2: 30 mmol/L (ref 0–100)
pH, Arterial: 7.286 — ABNORMAL LOW (ref 7.350–7.450)

## 2015-10-06 LAB — HEPATIC FUNCTION PANEL
ALT: 178 U/L — ABNORMAL HIGH (ref 17–63)
AST: 177 U/L — AB (ref 15–41)
Albumin: 2.1 g/dL — ABNORMAL LOW (ref 3.5–5.0)
Alkaline Phosphatase: 86 U/L (ref 38–126)
BILIRUBIN DIRECT: 0.2 mg/dL (ref 0.1–0.5)
BILIRUBIN INDIRECT: 0.1 mg/dL — AB (ref 0.3–0.9)
BILIRUBIN TOTAL: 0.3 mg/dL (ref 0.3–1.2)
Total Protein: 5.7 g/dL — ABNORMAL LOW (ref 6.5–8.1)

## 2015-10-06 LAB — POCT I-STAT 3, VENOUS BLOOD GAS (G3P V)
Acid-Base Excess: 2 mmol/L (ref 0.0–2.0)
BICARBONATE: 29.3 meq/L — AB (ref 20.0–24.0)
O2 SAT: 52 %
PCO2 VEN: 66.2 mmHg — AB (ref 45.0–50.0)
PO2 VEN: 33 mmHg (ref 30.0–45.0)
TCO2: 31 mmol/L (ref 0–100)
pH, Ven: 7.254 (ref 7.250–7.300)

## 2015-10-06 LAB — GLUCOSE, CAPILLARY
GLUCOSE-CAPILLARY: 104 mg/dL — AB (ref 65–99)
GLUCOSE-CAPILLARY: 126 mg/dL — AB (ref 65–99)
GLUCOSE-CAPILLARY: 137 mg/dL — AB (ref 65–99)
Glucose-Capillary: 117 mg/dL — ABNORMAL HIGH (ref 65–99)
Glucose-Capillary: 117 mg/dL — ABNORMAL HIGH (ref 65–99)
Glucose-Capillary: 113 mg/dL — ABNORMAL HIGH (ref 65–99)

## 2015-10-06 LAB — TRIGLYCERIDES: Triglycerides: 569 mg/dL — ABNORMAL HIGH (ref <150)

## 2015-10-06 LAB — VANCOMYCIN, TROUGH: VANCOMYCIN TR: 40 ug/mL — AB (ref 10.0–20.0)

## 2015-10-06 LAB — PROCALCITONIN: Procalcitonin: 7.99 ng/mL

## 2015-10-06 MED ORDER — VITAL HIGH PROTEIN PO LIQD
1000.0000 mL | ORAL | Status: DC
  Administered 2015-10-06 – 2015-10-08 (×3): 1000 mL
  Filled 2015-10-06 (×6): qty 1000

## 2015-10-06 MED ORDER — MIDAZOLAM HCL 5 MG/ML IJ SOLN
1.0000 mg/h | INTRAMUSCULAR | Status: DC
  Administered 2015-10-07 (×2): 8 mg/h via INTRAVENOUS
  Administered 2015-10-08: 5 mg/h via INTRAVENOUS
  Administered 2015-10-08: 2 mg/h via INTRAVENOUS
  Administered 2015-10-09: 5 mg/h via INTRAVENOUS
  Filled 2015-10-06 (×6): qty 20

## 2015-10-06 MED ORDER — VITAL HIGH PROTEIN PO LIQD
1000.0000 mL | ORAL | Status: DC
  Filled 2015-10-06: qty 1000

## 2015-10-06 MED ORDER — MIDAZOLAM HCL 5 MG/ML IJ SOLN
1.0000 mg/h | INTRAMUSCULAR | Status: DC
  Administered 2015-10-06: 0.5 mg/h via INTRAVENOUS
  Filled 2015-10-06 (×3): qty 10

## 2015-10-06 MED ORDER — SODIUM CHLORIDE 0.9 % IV SOLN
100.0000 ug/h | INTRAVENOUS | Status: DC
  Administered 2015-10-06: 200 ug/h via INTRAVENOUS
  Administered 2015-10-06: 150 ug/h via INTRAVENOUS
  Administered 2015-10-08: 100 ug/h via INTRAVENOUS
  Administered 2015-10-08 – 2015-10-09 (×2): 300 ug/h via INTRAVENOUS
  Administered 2015-10-09: 325 ug/h via INTRAVENOUS
  Administered 2015-10-09: 300 ug/h via INTRAVENOUS
  Filled 2015-10-06 (×11): qty 50

## 2015-10-06 MED ORDER — DEXMEDETOMIDINE HCL IN NACL 200 MCG/50ML IV SOLN
0.4000 ug/kg/h | INTRAVENOUS | Status: DC
  Administered 2015-10-06: 0.7 ug/kg/h via INTRAVENOUS
  Administered 2015-10-06: 0.4 ug/kg/h via INTRAVENOUS
  Administered 2015-10-06: 0.6 ug/kg/h via INTRAVENOUS
  Administered 2015-10-06 – 2015-10-07 (×8): 0.5 ug/kg/h via INTRAVENOUS
  Filled 2015-10-06 (×4): qty 50
  Filled 2015-10-06: qty 100
  Filled 2015-10-06 (×2): qty 50
  Filled 2015-10-06: qty 100
  Filled 2015-10-06: qty 50

## 2015-10-06 NOTE — Progress Notes (Signed)
CRITICAL VALUE ALERT  Critical value received:  Vanc trough  Date of notification:  10/06/2015  Time of notification:  2250  Critical value read back:Yes.    Nurse who received alert:  Conrad Burlingtonara T. RN  Pharmacy notified

## 2015-10-06 NOTE — Progress Notes (Signed)
PULMONARY / CRITICAL CARE MEDICINE   Name: Adrian DiegoCarlton B Landstrom Jr. MRN: [id number] DOB: [birth year]    ADMISSION DATE:  10/04/2015  [facility]  CHIEF COMPLAINT: SOB  HISTORY OF PRESENT ILLNESS:   54 yo who [facility] with [facility], he is 527 lbs and has OSA, also has drainage from left leg and panus consistent with cellulitis. He was treated with abx and BC revealed strep group h in 2/2. 12/18 he became hypoxic, ams and with general decline. [facility] did not have adequate nursing staff therefore [facility]. Currently he is on NIMVS bu with bacteremia/sepsis and suspected aspiration pna he will mos tlikely need intubation. Note he bipolar and on sedatives which may be adding to his altered mental status. We will check 12 lead and CE for completeness along with pro calcitonin panel. He is on adequate abx.  SUBJECTIVE:  fio2 to 0.50, PEEP at 10 Double triggering every breath Suctioned last night for secretions and desats  VITAL SIGNS: BP 104/51 mmHg  Pulse 75  Temp(Src) 98.1 F (36.7 C) (Oral)  Resp 19  Ht 5\' 11"  (1.803 m)  Wt 229.974 kg (507 lb)  BMI 70.74 kg/m2  SpO2 100%  HEMODYNAMICS:    VENTILATOR SETTINGS: Vent Mode:  [-] PRVC FiO2 (%):  [50 %-60 %] 50 % Set Rate:  [26 bmp] 26 bmp Vt Set:  [450 mL] 450 mL PEEP:  [10 cmH20] 10 cmH20 Plateau Pressure:  [18 cmH20-25 cmH20] 18 cmH20  INTAKE / OUTPUT: I/O last 3 completed shifts: In: [id number] [I.V.:2596; NG/GT:80; IV Piggyback:1800] Out: 2965 [Urine:2915; Emesis/NG output:50]  PHYSICAL EXAMINATION: General:  MO male on MV Neuro:  Wakes easily to voice, moves all ext HEENT:  ETT in place, unable to assess neck,  Cardiovascular:  Very distant, S2 only thing heard Lungs:  Decreased air movement. Very distant Abdomen:  Obese panus dressing in place, panus wound is full thickness, 16x2x1.5 cm with yellow drainage, no odor. Musculoskeletal:  intact Skin:  Leg dressing in place, L heel wound    LABS:  BMET  Recent Labs Lab 10/04/15 2350 10/05/15 0520 10/06/15 0439  NA 137 139 142  K 4.4 4.3 3.8  CL 101 101 106  CO2 26 25 26   BUN 30* 34* 46*  CREATININE 1.86* 1.85* 1.98*  GLUCOSE 156* 168* 139*    Electrolytes  Recent Labs Lab 10/04/15 2350 10/05/15 0520 10/06/15 0439  CALCIUM 7.9* 7.6* 7.8*  MG 2.7* 2.5*  --   PHOS 6.8* 7.2*  --     CBC  Recent Labs Lab 10/04/15 2350 10/05/15 0520 10/06/15 0439  WBC 13.7* 8.9 9.3  HGB 8.2* 7.3* 7.3*  HCT 26.2* 24.1* 22.6*  PLT 214 198 206    Coag's  Recent Labs Lab 10/04/15 2350  APTT 44*  INR 1.23    Sepsis Markers  Recent Labs Lab 10/02/15 1139 10/02/15 1841 10/04/15 2350 10/05/15 0520 10/06/15 0439  LATICACIDVEN 3.4* 2.9* 0.9  --   --   PROCALCITON  --   --  12.16 12.70 7.99    ABG  Recent Labs Lab 10/05/15 0136 10/05/15 0415 10/06/15 0326  PHART 7.253* 7.316* 7.384  PCO2ART 63.7* 48.0* 43.5  PO2ART 278.0* 205* 220*    Liver Enzymes  Recent Labs Lab 10/02/15 1139 10/04/15 2350 10/06/15 0439  AST 28 106* 177*  ALT 26 95* 178*  ALKPHOS 83 79 86  BILITOT 0.7 0.5 0.3  ALBUMIN 3.5 2.5* 2.1*  Cardiac Enzymes  Recent Labs Lab 10/04/15 2350 10/05/15 0520 10/05/15 1113  TROPONINI 0.13* 0.07* 0.05*    Glucose  Recent Labs Lab 10/05/15 0827 10/05/15 1210 10/05/15 1713 10/05/15 2000 10/06/15 0019 10/06/15 0425  GLUCAP 165* 144* 138* 135* 137* 117*    Imaging Dg Chest Port 1 View  10/06/2015  CLINICAL DATA:  ARDS. EXAM: PORTABLE CHEST 1 VIEW COMPARISON:  10/04/2015 FINDINGS: Portions of the right lateral chest not imaged. Endotracheal tube and NG tube in stable position. Cardiomegaly. Bilateral pulmonary infiltrates, particularly severe in right lung are noted. No interim change No pneumothorax. No acute bony abnormality . IMPRESSION: 1. Lines and tubes in stable position. 2. Dense diffuse right lung infiltrate, unchanged. Mild left lung infiltrate unchanged. 3.  Stable cardiomegaly. Electronically Signed   By: Maisie Fus  Register   On: 10/06/2015 07:45     STUDIES:    CULTURES: 12/16 bc [facility]) >> Strep group g 2 of 2>> 12/16 [facility]) >> multiple species 12/18 urine >> negative 12/19 Resp >> moderate candida 12/18 Blood >> GPC clusters >>   ANTIBIOTICS: ceftriaxone 12/17 >> 12/19 vanco 12/18 >> clinda 12/19 >> 12/19 zosyn 12/19 >>   SIGNIFICANT EVENTS: 12/16 admit to [facility] with panus infection 12/18 Transfer to [facility] due to no [facility] nurses at [facility].  LINES/TUBES: ETT 12/18 >>   DISCUSSION: 527 lb male with panus and leg infections, sepsis. Hypoxic resp failure due to ALI superimposed on OHS +/- sedating meds. Intubated 12/18  ASSESSMENT / PLAN:  PULMONARY A: ARDS Suspected aspiration event with probable HCAP Hx of asthma  P:   MV with ARDS protocol, wean Fio2 and PEEP as able May need to increase sedation to allow low Vt's Diuresis if he can tolerate, note renal dysfxn  INFECTIOUS A:   Cellulitis of panus  Group G strep bacteremia HCAP / aspiration PNA P:   Changed ceftriaxone and clinda to zosyn given suspected HCAP / aspiration and in case pseudomonal involvement at pannus (doubt) Appreciate WOC consult > dressing changes and other intervention per their recs Will order TTE now  CARDIOVASCULAR A:  HTN P:  Hold antihypertensives as normotensive on sedation.  TTE this admission given Strep bacteremia PICC on 12/20 now that he has had several days abx  RENAL A:   Acute renal failure P:   Follow BMP and UOP  GASTROINTESTINAL A:   GI protection Mild transaminitis, ? etiology P:   PPI Follow LFT Start trickle TF, increase when / if off propofol  HEMATOLOGIC A:   No acute issue P:  DVT protection with heparin   ENDOCRINE A:   DM P:   SSI  NEUROLOGIC A:   Sedation for MV Bipolar disorder P:   RASS goal: -1 to -2 Propofol > change to versed given elevated triglycerides Bipolar medications  ordered > trileptal and geodon   FAMILY  - Updates:  RN was able to reach mother and sister early am 12/19. None present here today  - Inter-disciplinary family meet or Palliative Care meeting due by:  12/25  Independent CC time 40 minutes  Levy Pupa, MD, PhD 10/06/2015, 9:34 AM [facility] [phone] or if no answer [phone]

## 2015-10-06 NOTE — Procedures (Signed)
Central Venous Catheter Insertion Procedure Note Adrian DiegoCarlton B Littman Jr. [id number] 12/08/1960  Procedure: Insertion of Central Venous Catheter Indications: Assessment of intravascular volume, Drug and/or fluid administration and Frequent blood sampling  Procedure Details Consent: Risks of procedure as well as the alternatives and risks of each were explained to the (patient/caregiver).  Consent for procedure obtained. Time Out: Verified patient identification, verified procedure, site/side was marked, verified correct patient position, special equipment/implants available, medications/allergies/relevent history reviewed, required imaging and test results available.  Performed  Maximum sterile technique was used including antiseptics, cap, gloves, gown, hand hygiene, mask and sheet. Skin prep: Chlorhexidine; local anesthetic administered A antimicrobial bonded/coated triple lumen catheter was placed in the left internal jugular vein using the Seldinger technique.  Evaluation Blood flow good Complications: No apparent complications Patient did tolerate procedure well. Chest X-ray ordered to verify placement.  CXR: pending.  Nelda BucksFEINSTEIN,DANIEL J. 10/06/2015, 4:31 PM  US i did thisd procedure all by myself!!! Mcarthur Rossettianiel J. Tyson AliasFeinstein, MD, FACP Pgr: [phone] [place] [facility]

## 2015-10-06 NOTE — Progress Notes (Signed)
Peripherally Inserted Central Catheter/Midline Placement  The IV Nurse has discussed with the patient and/or persons authorized to consent for the patient, the purpose of this procedure and the potential benefits and risks involved with this procedure.  The benefits include less needle sticks, lab draws from the catheter and patient may be discharged home with the catheter.  Risks include, but not limited to, infection, bleeding, blood clot (thrombus formation), and puncture of an artery; nerve damage and irregular heat beat.  Alternatives to this procedure were also discussed.  PICC/Midline Placement Documentation     * Consent via telephone with Media [name] (mother).   Consuello Masseimmons, Netasha Wehrli M 10/06/2015, 3:46 PM

## 2015-10-06 NOTE — Plan of Care (Signed)
Problem: Education: Goal: Knowledge of [place] Education information/materials will improve Outcome: Not Progressing Patient sedated.  Problem: Safety: Goal: Ability to remain free from injury will improve Outcome: Progressing Use of MaxiSky to transfer patient. Use of bed rails to decrease risk of falls.  Problem: Health Behavior/Discharge Planning: Goal: Ability to manage health-related needs will improve Outcome: Not Progressing Left rehab AMA and was receiving care at home from home health, church friends, and 89 y.o. Mother.   Problem: Physical Regulation: Goal: Ability to maintain clinical measurements within normal limits will improve Outcome: Progressing Still in need of sedation to remain synchronous with the ventilator, but VS all WNL. Goal: Will remain free from infection Outcome: Not Progressing Currently has positive blood cultures for gram + cocci and clusters. On contact precaution for MRSA.

## 2015-10-06 NOTE — Progress Notes (Signed)
Pt was at home w 54 yr old mother who is not doing well w heart failure. Pt will not have care at disch. He may be ltac dep on progres or snf. sw ref has been made. Will follow pt progress.

## 2015-10-06 NOTE — Progress Notes (Signed)
William HamburgerMelinda [name], friend of the family visited today. Pt's mother, Adrian [name], gave permission for us to discuss the patients care with [name].  Verified the password with [name], it is [phone].  [name] also gave permission to discuss any updates with [name] who may visit.  Pt's mother and sister are not well and likely will not visit.  Adrian AlcideMelinda was very helpful in understanding the pt's life at home and will update the pt's mother with any updates that she gets.    [name], [name], RN

## 2015-10-06 NOTE — Progress Notes (Signed)
eLink Physician-Brief Progress Note Patient Name: Adrian DiegoCarlton B Demello Jr. DOB: 05/11/1961 MRN: [id number]   Date of Service  10/06/2015  HPI/Events of Note  cvl did not cross midline but he is veyr obese and so likely venous  eICU Interventions  Do vbg and art wavefiorm to ensure is venous     Intervention Category Intermediate Interventions: Diagnostic test evaluation  Zuma Hust 10/06/2015, 5:27 PM

## 2015-10-06 NOTE — Progress Notes (Signed)
eLink Physician-Brief Progress Note Patient Name: Adrian DiegoCarlton B Kulkarni Jr. DOB: [birth year] MRN: [id number]   Date of Service  10/06/2015  HPI/Events of Note  abg v cvl blood gas  abg -> 7.28/59/97 cvl blood gas 7.25/66/33   eICU Interventions  cvl is venous - ok to use cvl for IV gtt        Mcguire Gasparyan 10/06/2015, 6:58 PM

## 2015-10-06 NOTE — Progress Notes (Signed)
Pharmacy Antibiotic Follow-up Note  Adrian DiegoCarlton B Daino Jr. is a 54 y.o. year-old male admitted on 10/04/2015.  The patient is currently on day 3 of Vancomycin + Zosyn for cellulitis/sepsis/aspiration PNA  Assessment/Plan: 54 YOM on Vancomycin + Zosyn for empiric cellulitis/sepsis/aspiration PNA coverage with [facility] grow Group G Strep. A Vancomycin trough this evening resulted as SUPRAtherapeutic (40 mcg/ml, goal of 15-20 mcg/ml). Will plan to hold Vancomycin for now - will check another VR around 1200 tomorrow to check clearance.  Plan 1. Hold Vancomycin for now 2. Will obtain another Vancomycin random on 12/21 at 1200 to access clearance  Temp (24hrs), Avg:98.5 F (36.9 C), Min:98.1 F (36.7 C), Max:98.8 F (37.1 C)   Recent Labs Lab 10/02/15 1841 10/03/15 0403 10/04/15 2350 10/05/15 0520 10/06/15 0439  WBC 23.9* 16.6* 13.7* 8.9 9.3    Recent Labs Lab 10/03/15 0403 10/04/15 0509 10/04/15 2350 10/05/15 0520 10/06/15 0439  CREATININE 1.57* 1.88* 1.86* 1.85* 1.98*   Estimated Creatinine Clearance: 82.8 mL/min (by C-G formula based on Cr of 1.98).    No Known Allergies  Antimicrobials this admission: Rocephin 12/71>>12/19 Zosyn 12/19>> Vanco 12/18>> Clinda 12/19>>12/19  Levels/dose changes this admission: 12/20 VT @ 2150 >> 40 mcg/ml >> doses held  Microbiology results: [facility] BCx 2/2 >> Group G Strep 12/19 RCx >> Candida albicans 12/18 BCx >> 1/2 GPC in clusters 12/18 MRSA PCR >> positive  Thank you for allowing pharmacy to be a part of this patient's care.  Georgina PillionElizabeth Nikolus Marczak, PharmD, BCPS Clinical Pharmacist Pager: [phone] 10/06/2015 10:55 PM

## 2015-10-06 NOTE — Progress Notes (Signed)
PICC line attempted x 2 without success. Basilic vein cannulated with great blood return however, was unable to thread the guidewire past a certain point. Cindy RN present to provide a second assessment. Consuello Masseimmons, Lesette Frary M

## 2015-10-06 NOTE — Progress Notes (Signed)
Attempted right arm PICC insertion without success.  Attempted basilic vein after assisting Dewain Penninghristina Timmons, RN with insertion also.   Unable to thread guidewire or PICC without difficulty.   Pt was forming large clots of blood on guidewire and in introducer as well as the needle during the procedure.  Primary RN made aware.

## 2015-10-07 ENCOUNTER — Inpatient Hospital Stay (HOSPITAL_COMMUNITY): Payer: Medicare Other

## 2015-10-07 DIAGNOSIS — I10 Essential (primary) hypertension: Secondary | ICD-10-CM

## 2015-10-07 DIAGNOSIS — A419 Sepsis, unspecified organism: Secondary | ICD-10-CM

## 2015-10-07 LAB — CBC
HEMATOCRIT: 23.7 % — AB (ref 39.0–52.0)
Hemoglobin: 7.4 g/dL — ABNORMAL LOW (ref 13.0–17.0)
MCH: 28.2 pg (ref 26.0–34.0)
MCHC: 31.2 g/dL (ref 30.0–36.0)
MCV: 90.5 fL (ref 78.0–100.0)
PLATELETS: 225 10*3/uL (ref 150–400)
RBC: 2.62 MIL/uL — ABNORMAL LOW (ref 4.22–5.81)
RDW: 17 % — AB (ref 11.5–15.5)
WBC: 12 10*3/uL — AB (ref 4.0–10.5)

## 2015-10-07 LAB — BASIC METABOLIC PANEL
Anion gap: 14 (ref 5–15)
BUN: 66 mg/dL — AB (ref 6–20)
CALCIUM: 7.9 mg/dL — AB (ref 8.9–10.3)
CHLORIDE: 104 mmol/L (ref 101–111)
CO2: 25 mmol/L (ref 22–32)
CREATININE: 2.95 mg/dL — AB (ref 0.61–1.24)
GFR calc Af Amer: 26 mL/min — ABNORMAL LOW (ref >60)
GFR calc non Af Amer: 23 mL/min — ABNORMAL LOW (ref >60)
GLUCOSE: 101 mg/dL — AB (ref 65–99)
Potassium: 4.7 mmol/L (ref 3.5–5.1)
Sodium: 143 mmol/L (ref 135–145)

## 2015-10-07 LAB — TRIGLYCERIDES: Triglycerides: 430 mg/dL — ABNORMAL HIGH (ref <150)

## 2015-10-07 LAB — GLUCOSE, CAPILLARY
GLUCOSE-CAPILLARY: 103 mg/dL — AB (ref 65–99)
Glucose-Capillary: 102 mg/dL — ABNORMAL HIGH (ref 65–99)
Glucose-Capillary: 102 mg/dL — ABNORMAL HIGH (ref 65–99)
Glucose-Capillary: 109 mg/dL — ABNORMAL HIGH (ref 65–99)

## 2015-10-07 LAB — CULTURE, RESPIRATORY W GRAM STAIN

## 2015-10-07 LAB — CULTURE, BLOOD (ROUTINE X 2)

## 2015-10-07 LAB — VANCOMYCIN, RANDOM: Vancomycin Rm: 33 ug/mL

## 2015-10-07 LAB — CULTURE, RESPIRATORY

## 2015-10-07 LAB — MAGNESIUM: Magnesium: 3 mg/dL — ABNORMAL HIGH (ref 1.7–2.4)

## 2015-10-07 MED ORDER — PERFLUTREN LIPID MICROSPHERE
1.0000 mL | INTRAVENOUS | Status: AC | PRN
  Filled 2015-10-07: qty 10

## 2015-10-07 MED ORDER — PERFLUTREN LIPID MICROSPHERE
INTRAVENOUS | Status: AC
  Administered 2015-10-07: 2 mL
  Filled 2015-10-07: qty 10

## 2015-10-07 MED ORDER — METOPROLOL TARTRATE 25 MG/10 ML ORAL SUSPENSION
25.0000 mg | Freq: Two times a day (BID) | ORAL | Status: DC
  Administered 2015-10-07 – 2015-10-14 (×15): 25 mg
  Filled 2015-10-07 (×15): qty 10

## 2015-10-07 NOTE — Progress Notes (Signed)
PULMONARY / CRITICAL CARE MEDICINE   Name: Adrian [name]. MRN: [id number] DOB: April 26, 1961    ADMISSION DATE:  10/04/2015  REFERRING MD:  [facility]  CHIEF COMPLAINT: SOB  HISTORY OF PRESENT ILLNESS:   54 yo who presented  to [facility] 12/16 with SOB, he is 527 lbs and has OSA, also has drainage from left leg and panus consistent with cellulitis. He was treated with abx and BC revealed strep group h in 2/2. 12/18 he became hypoxic, ams and with general decline. [facility] did not have adequate nursing staff therefore he is being [facility]. Currently he is intubated but with bacteremia/sepsis and suspected aspiration pna he will mos tlikely need intubation. Note he bipolar and on sedatives which may be adding to his altered mental status. We will check 12 lead and CE for completeness along with pro calcitonin panel. He is on adequate abx.  SUBJECTIVE:  No acute issues overnight. Off sedation, awake and following commands. Denies pain. Remains on fio2 to 40 and PEEP of 10.  VITAL SIGNS: BP 158/98 mmHg  Pulse 73  Temp(Src) 98.5 F (36.9 C) (Oral)  Resp 14  Ht  (1.803 m)  Wt 228.159 kg (503 lb)  BMI 70.19 kg/m2  SpO2 97%  HEMODYNAMICS: CVP:  [12 mmHg-16 mmHg] 12 mmHg  VENTILATOR SETTINGS: Vent Mode:  [-] PRVC FiO2 (%):  [40 %] 40 % Set Rate:  [26 bmp] 26 bmp Vt Set:  [450 mL] 450 mL PEEP:  [10 cmH20] 10 cmH20 Plateau Pressure:  [21 cmH20-28 cmH20] 26 cmH20  INTAKE / OUTPUT: I/O last 3 completed shifts: In: 3372.3 [I.V.:2410.3; NG/GT:312; IV Piggyback:650] Out: 2150 [Urine:2150]  PHYSICAL EXAMINATION: General:  Morbidly obese male on MV Neuro:  Awakens to voice and touch, follows commands, moves all extremities to commands. HEENT:  ETT in place, Oral mucosa moist  Cardiovascular:  RRR, S1/ S2  Lungs: Unlabored on the vent, diminished airflow bilaterally. Abdomen:  Obese panus dressing in place, panus wound is full thickness, 16x2x1.5 cm with yellow drainage, no  odor. Musculoskeletal: No joint deformities. Skin:  Leg dressing in place, L heel wound.  LABS:  BMET  Recent Labs Lab 10/05/15 0520 10/06/15 0439 10/07/15 0606  NA 139 142 143  K 4.3 3.8 4.7  CL 101 106 104  CO2 BUN 34* 46* 66*  CREATININE 1.85* 1.98* 2.95*  GLUCOSE 168* 139* 101*   Electrolytes  Recent Labs Lab 10/04/15 2350 10/05/15 0520 10/06/15 0439 10/07/15 0606  CALCIUM 7.9* 7.6* 7.8* 7.9*  MG 2.7* 2.5*  --  3.0*  PHOS 6.8* 7.2*  --   --    CBC  Recent Labs Lab 10/05/15 0520 10/06/15 0439 10/07/15 0606  WBC 8.9 9.3 12.0*  HGB 7.3* 7.3* 7.4*  HCT 24.1* 22.6* 23.7*  PLT 198 206 225   Coag's  Recent Labs Lab 10/04/15 2350  APTT 44*  INR 1.23   Sepsis Markers  Recent Labs Lab 10/02/15 1139 10/02/15 1841 10/04/15 2350 10/05/15 0520 10/06/15 0439  LATICACIDVEN 3.4* 2.9* 0.9  --   --   PROCALCITON  --   --  12.16 12.70 7.99   ABG  Recent Labs Lab 10/05/15 0415 10/06/15 0326 10/06/15 1807  PHART 7.316* 7.384 7.286*  PCO2ART 48.0* 43.5 58.8*  PO2ART 205* 220* 97.0   Liver Enzymes  Recent Labs Lab 10/02/15 1139 10/04/15 2350 10/06/15 0439  AST 28 106* 177*  ALT 26 95* 178*  ALKPHOS 83 79 86  BILITOT 0.7 0.5 0.3  ALBUMIN 3.5 2.5* 2.1*   Cardiac Enzymes  Recent Labs Lab 10/04/15 2350 10/05/15 0520 10/05/15 1113  TROPONINI 0.13* 0.07* 0.05*   Glucose  Recent Labs Lab 10/06/15 1215 10/06/15 1556 10/06/15 2001 10/07/15 0016 10/07/15 0406 10/07/15 0808  GLUCAP 117* 113* 104* 102* 102* 103*   Imaging Dg Chest Port 1 View  10/07/2015  CLINICAL DATA:  Acute respiratory failure, shortness of breath EXAM: PORTABLE CHEST 1 VIEW COMPARISON:  Portable chest x-ray of October 06, 2015 FINDINGS: The lungs remain hypoinflated. Confluent alveolar opacities and increased interstitial densities persist but are slightly less conspicuous today. The cardiac silhouette remains enlarged. The pulmonary vascularity remains  engorged. The endotracheal tube tip lies 4.6 cm above the carina. The esophagogastric tube tip projects below the inferior margin of the image. The left internal jugular venous catheter tip projects at the junction of the left internal jugular vein with the left subclavian vein. This is stable. IMPRESSION: Slight interval improvement in the alveolar opacities since yesterday's study may reflect some improvement in pulmonary edema or bilateral pneumonia. The positioning of the left sided vascular catheter is stable and is presumably venous in location. Please see discussion in the conclusion of the report of the chest x-ray of October 06, 2015. Electronically Signed   By: David  [place] M.D.   On: 10/07/2015 07:16   Dg Chest Port 1 View  10/06/2015  CLINICAL DATA:  New left IJ central venous catheter. EXAM: PORTABLE CHEST 1 VIEW COMPARISON:  10/06/2015; 10/04/2015 FINDINGS: Grossly unchanged enlarged cardiac silhouette and mediastinal contours. Minimally improved aeration of the lungs with persistent perihilar predominant heterogeneous airspace opacities with relative areas of confluence within the medial aspect the right upper lung as well as the left retrocardiac lung. No new focal airspace opacities. Interval placement of a left cervical approach catheter with tip overlying the aortic arch. No supine evidence of pneumothorax or pleural effusion. Otherwise, stable position remaining support apparatus. Unchanged bones. IMPRESSION: 1. Interval placement of a left cervical approach catheter whose tip overlies the aortic arch and thus is an indeterminate location. Correlation with blood gas and/or waveform is recommended to exclude the presence of an inadvertent arterial catheter 2. Otherwise, stable position of support apparatus. No supine evidence of pneumothorax. 3. Minimally improved aeration along suggests slightly improved atelectasis and/or edema. 4. Residual extensive perihilar opacities with broad  differential considerations including alveolar pulmonary edema, multifocal infection and/or aspiration. These results will be called to the ordering clinician or representative by the Radiologist Assistant, and communication documented in the PACS or zVision Dashboard. Electronically Signed   By: Simonne Come M.D.   On: 10/06/2015 17:09   STUDIES:    CULTURES: 12/16 [facility]) >> Strep group g 2 of 2>> 12/16 [facility]) >> multiple species 12/18 urine >> negative 12/19 Resp >> moderate candida 12/18 Blood >> GPC clusters >>  12/21 Blood >>   ANTIBIOTICS: ceftriaxone 12/17 >> 12/19 vanco 12/18 >> clinda 12/19 >> 12/19 zosyn 12/19 >>   SIGNIFICANT EVENTS: 12/16 admit to [facility] with panus infection 12/18 Transfer to [facility] due to no [facility] nurses at [facility].  LINES/TUBES: ETT 12/18 >>  L IJ TLC 12/20>>>  DISCUSSION: 527 lb male with panus and leg infections, sepsis, and hypoxic respiratory failure due to ALI superimposed on OHS +/- sedating meds. Intubated 12/18; remains on full vent support with PEEP of 10 12/21.   ASSESSMENT / PLAN:  PULMONARY A: ARDS-mild improvement on 12/21 CXR Suspected aspiration event  with probable HCAP Hx of asthma  P:   - Begin PS trials. - Decrease PEEP to 5. - Hold diuresis due to worsening kidney function  INFECTIOUS A:   Cellulitis of panus  Group G strep bacteremia HCAP / aspiration PNA P:   - Changed ceftriaxone and clinda to zosyn given suspected HCAP/aspiration and in case pseudomonal involvement at pannus (doubt) - Appreciate WOC consult > dressing changes and other intervention per their recs - Restart vancomycin with renal adjustment given rising WBC. - Echo with EF of 50%, may need TEE given MSSA bacteremia, will reculture today, if negative then no need for TEE.  CARDIOVASCULAR A:  HTN-BP stable on no antihypertensives P:  - Continue to hold antihypertensives as normotensive on sedation.  - TEE this admission given Strep bacteremia -  CVL placed 12/20  RENAL A:   Acute renal failure-Worsening creatinine 12/21 2.95(1.98 12/20) P:   - Follow BMP and UOP - Avoid nephrotoxic medications - D/C Vanco  GASTROINTESTINAL A:   GI protection Mild transaminitis, ? etiology P:   - PPI - Follow LFT - Continue TF.  HEMATOLOGIC A:   Anemia of chronic disease; Hg stable between 7-8g/dl P:  - DVT protection with heparin - Monitor Hg/HCT  ENDOCRINE A:   Type 2 DM P:   - Blood glucose monitoring and SSI per protocol  NEUROLOGIC A:   Sedation for MV Bipolar disorder P:   RASS goal: -1 to -2 -Continue  Vent sedation precedex versed gtts and prn fentanyl -Bipolar medications ordered > trileptal and geodon   FAMILY  - Updates:  RN was able to reach mother and sister early am 12/19. None present here today  - Inter-disciplinary family meet or Palliative Care meeting due by:  12/25  Critical care time spent examining patient, establishing treatment plan, managing vent, reviewing history and labs, CXR, and ABG interpretation is 30 minutes  Magdalene S. Tukov ANP-BC Pulmonary and Critical Care Medicine [facility] Pager: [phone]  Attending Note:  54 year old morbidly obese male who was intubated due to pneumonia and septic shock.  I edited above note in full including the physical exam above.  Above note reflects my findings.  More awake on exam.  Will restart vanc given worsening renal function.  F/u on culture.  Reculture blood today, if negative then will not get TEE, if remains positive then will TEE.  Will likely trial extubate in AM.  The patient is critically ill with multiple organ systems failure and requires high complexity decision making for assessment and support, frequent evaluation and titration of therapies, application of advanced monitoring technologies and extensive interpretation of multiple databases.   Critical Care Time devoted to patient care services described in this note is   35  Minutes. This time reflects time of care of this signee Dr Koren BoundWesam Ericka Marcellus. This critical care time does not reflect procedure time, or teaching time or supervisory time of PA/NP/Med student/Med Resident etc but could involve care discussion time.  Alyson ReedyWesam G. Alechia Lezama, M.D. [facility]. Pager: [phone]. After hours pager: [phone].

## 2015-10-07 NOTE — Progress Notes (Signed)
Pharmacy Antibiotic Follow-up Note  Adrian DiegoCarlton B Ernest Jr. is a 54 y.o. year-old male admitted on 10/04/2015.  The patient is currently on day 4 of Vancomycin + Zosyn for cellulitis/sepsis/aspiration PNA  Assessment/Plan: 54 YOM on Vancomycin + Zosyn for empiric cellulitis/sepsis/aspiration PNA coverage with [facility] Group G Strep. A Vancomycin trough yesterday evening resulted as SUPRAtherapeutic (40 mcg/ml, goal of 15-20 mcg/ml) and further vancomycin doses were put on hold and repeat VR ordered for 1200 today. Resulting VR is still SUPRAtherapeutic (33 mcg/ml, goal of 15-20 mcg/mL). The patient's SCr has been trending up over the past few days and is now 2.95, up from 1.98 yesterday.   Plan 1. Continue to hold Vancomycin for now 2. Will obtain another Vancomycin random on 12/23 with AM labs to assess clearance  Temp (24hrs), Avg:98.9 F (37.2 C), Min:98.5 F (36.9 C), Max:99.5 F (37.5 C)   Recent Labs Lab 10/03/15 0403 10/04/15 2350 10/05/15 0520 10/06/15 0439 10/07/15 0606  WBC 16.6* 13.7* 8.9 9.3 12.0*     Recent Labs Lab 10/04/15 0509 10/04/15 2350 10/05/15 0520 10/06/15 0439 10/07/15 0606  CREATININE 1.88* 1.86* 1.85* 1.98* 2.95*   Estimated Creatinine Clearance: 55.3 mL/min (by C-G formula based on Cr of 2.95).    No Known Allergies  Antimicrobials this admission: Rocephin 12/17>>12/19 Vanc 12/18>> Zosyn 12/19>> Clinda 12/19>>12/19  Levels/dose changes this admission: 12/20 VT @ 2150 >> 40 mcg/ml >> doses held 12/21 VR @ 1139 >> 33 mcg/ml >> doses held  Microbiology results: [facility] BCx 2/2 >> Group G Strep 12/19 RCx >> moderate candida albicans 12/18 BCx >> 1/2 CoNS 12/18 MRSA PCR >> positive  Thank you for allowing pharmacy to be a part of this patient's care. Please contact us if you have any questions.  Adrian [name], PharmD Clinical Pharmacy Resident Pager: [phone]  10/07/2015 12:44 PM

## 2015-10-07 NOTE — Progress Notes (Signed)
  Echocardiogram 2D Echocardiogram has been performed.  Leta JunglingCooper, Prabhjot Piscitello M 10/07/2015, 3:07 PM

## 2015-10-07 NOTE — Care Management (Signed)
[facility]. [facility].  Thank you.  Ayesha RumpfMary Yonjof [facility] [phone]

## 2015-10-08 ENCOUNTER — Inpatient Hospital Stay (HOSPITAL_COMMUNITY): Payer: Medicare Other

## 2015-10-08 LAB — GLUCOSE, CAPILLARY
GLUCOSE-CAPILLARY: 121 mg/dL — AB (ref 65–99)
GLUCOSE-CAPILLARY: 136 mg/dL — AB (ref 65–99)
GLUCOSE-CAPILLARY: 127 mg/dL — AB (ref 65–99)
GLUCOSE-CAPILLARY: 112 mg/dL — AB (ref 65–99)
Glucose-Capillary: 111 mg/dL — ABNORMAL HIGH (ref 65–99)
Glucose-Capillary: 126 mg/dL — ABNORMAL HIGH (ref 65–99)
Glucose-Capillary: 139 mg/dL — ABNORMAL HIGH (ref 65–99)
Glucose-Capillary: 109 mg/dL — ABNORMAL HIGH (ref 65–99)
Glucose-Capillary: 114 mg/dL — ABNORMAL HIGH (ref 65–99)

## 2015-10-08 LAB — CBC
HCT: 25.3 % — ABNORMAL LOW (ref 39.0–52.0)
Hemoglobin: 7.7 g/dL — ABNORMAL LOW (ref 13.0–17.0)
MCH: 27.6 pg (ref 26.0–34.0)
MCHC: 30.4 g/dL (ref 30.0–36.0)
MCV: 90.7 fL (ref 78.0–100.0)
PLATELETS: 302 10*3/uL (ref 150–400)
RBC: 2.79 MIL/uL — ABNORMAL LOW (ref 4.22–5.81)
RDW: 17.1 % — AB (ref 11.5–15.5)
WBC: 15 10*3/uL — ABNORMAL HIGH (ref 4.0–10.5)

## 2015-10-08 LAB — PHOSPHORUS: Phosphorus: 5.4 mg/dL — ABNORMAL HIGH (ref 2.5–4.6)

## 2015-10-08 LAB — POCT I-STAT 3, ART BLOOD GAS (G3+)
Acid-Base Excess: 1 mmol/L (ref 0.0–2.0)
BICARBONATE: 27.4 meq/L — AB (ref 20.0–24.0)
O2 SAT: 97 %
TCO2: 29 mmol/L (ref 0–100)
pCO2 arterial: 50.1 mmHg — ABNORMAL HIGH (ref 35.0–45.0)
pH, Arterial: 7.346 — ABNORMAL LOW (ref 7.350–7.450)
pO2, Arterial: 99 mmHg (ref 80.0–100.0)

## 2015-10-08 LAB — BASIC METABOLIC PANEL
Anion gap: 13 (ref 5–15)
BUN: 67 mg/dL — AB (ref 6–20)
CALCIUM: 7.7 mg/dL — AB (ref 8.9–10.3)
CHLORIDE: 107 mmol/L (ref 101–111)
CO2: 26 mmol/L (ref 22–32)
CREATININE: 2.3 mg/dL — AB (ref 0.61–1.24)
GFR, EST AFRICAN AMERICAN: 35 mL/min — AB (ref >60)
GFR, EST NON AFRICAN AMERICAN: 30 mL/min — AB (ref >60)
Glucose, Bld: 145 mg/dL — ABNORMAL HIGH (ref 65–99)
Potassium: 3.9 mmol/L (ref 3.5–5.1)
SODIUM: 146 mmol/L — AB (ref 135–145)

## 2015-10-08 LAB — MAGNESIUM: MAGNESIUM: 3 mg/dL — AB (ref 1.7–2.4)

## 2015-10-08 MED ORDER — FENTANYL CITRATE (PF) 100 MCG/2ML IJ SOLN
200.0000 ug | Freq: Once | INTRAMUSCULAR | Status: AC
  Administered 2015-10-09: 200 ug via INTRAVENOUS

## 2015-10-08 MED ORDER — PRO-STAT SUGAR FREE PO LIQD
60.0000 mL | Freq: Two times a day (BID) | ORAL | Status: DC
  Administered 2015-10-08 – 2015-10-14 (×13): 60 mL
  Filled 2015-10-08 (×13): qty 60

## 2015-10-08 MED ORDER — LEVOFLOXACIN IN D5W 750 MG/150ML IV SOLN
750.0000 mg | INTRAVENOUS | Status: AC
  Administered 2015-10-08 – 2015-10-17 (×10): 750 mg via INTRAVENOUS
  Filled 2015-10-08 (×10): qty 150

## 2015-10-08 MED ORDER — AMLODIPINE BESYLATE 10 MG PO TABS
10.0000 mg | ORAL_TABLET | Freq: Every day | ORAL | Status: DC
  Administered 2015-10-08 – 2015-10-14 (×7): 10 mg via ORAL
  Filled 2015-10-08 (×7): qty 1

## 2015-10-08 MED ORDER — VECURONIUM BROMIDE 10 MG IV SOLR
10.0000 mg | Freq: Once | INTRAVENOUS | Status: DC
  Filled 2015-10-08: qty 10

## 2015-10-08 MED ORDER — FUROSEMIDE 10 MG/ML IJ SOLN
40.0000 mg | Freq: Once | INTRAMUSCULAR | Status: AC
  Administered 2015-10-08: 40 mg via INTRAVENOUS
  Filled 2015-10-08: qty 4

## 2015-10-08 MED ORDER — VITAL HIGH PROTEIN PO LIQD
1000.0000 mL | ORAL | Status: DC
  Administered 2015-10-08 – 2015-10-14 (×5): 1000 mL
  Filled 2015-10-08 (×12): qty 1000

## 2015-10-08 MED ORDER — PROPOFOL 500 MG/50ML IV EMUL
5.0000 ug/kg/min | Freq: Once | INTRAVENOUS | Status: DC
  Filled 2015-10-08: qty 50

## 2015-10-08 MED ORDER — HYDRALAZINE HCL 20 MG/ML IJ SOLN
10.0000 mg | Freq: Once | INTRAMUSCULAR | Status: AC
  Administered 2015-10-08: 10 mg via INTRAVENOUS
  Filled 2015-10-08: qty 1

## 2015-10-08 MED ORDER — SODIUM CHLORIDE 0.9 % IJ SOLN
10.0000 mL | Freq: Two times a day (BID) | INTRAMUSCULAR | Status: DC
  Administered 2015-10-08 (×2): 10 mL
  Administered 2015-10-09: 30 mL
  Administered 2015-10-09 – 2015-10-19 (×14): 10 mL

## 2015-10-08 MED ORDER — ETOMIDATE 2 MG/ML IV SOLN
40.0000 mg | Freq: Once | INTRAVENOUS | Status: AC
  Administered 2015-10-09: 20 mg via INTRAVENOUS

## 2015-10-08 MED ORDER — POTASSIUM CHLORIDE 20 MEQ/15ML (10%) PO SOLN
40.0000 meq | Freq: Three times a day (TID) | ORAL | Status: AC
  Administered 2015-10-08 (×2): 40 meq
  Filled 2015-10-08 (×2): qty 30

## 2015-10-08 MED ORDER — SODIUM CHLORIDE 0.9 % IJ SOLN: 10.0000 mL | INTRAMUSCULAR | Status: DC | PRN

## 2015-10-08 MED ORDER — MIDAZOLAM HCL 2 MG/2ML IJ SOLN
4.0000 mg | Freq: Once | INTRAMUSCULAR | Status: AC
  Administered 2015-10-09: 4 mg via INTRAVENOUS

## 2015-10-08 MED ORDER — FUROSEMIDE 10 MG/ML IJ SOLN
40.0000 mg | Freq: Four times a day (QID) | INTRAMUSCULAR | Status: AC
  Administered 2015-10-08 (×3): 40 mg via INTRAVENOUS
  Filled 2015-10-08 (×3): qty 4

## 2015-10-08 NOTE — Progress Notes (Signed)
Pharmacy Antibiotic Follow-up Note  Adrian DiegoCarlton B Peckham Jr. is a 54 y.o. year-old male admitted on 10/04/2015.  The patient is currently on day 4 of Vancomycin + Zosyn for cellulitis/sepsis/aspiration PNA  Assessment/Plan: [age] YOM who has been on Vancomycin + Zosyn for empiric cellulitis/sepsis/aspiration PNA coverage with [facility] Group G Strep. Vancomycin trough on 12/20 and random on 12/21 were both high, so vancomycin has been held since 12/20. [facility] showing Group G Strep sensitive to levofloxacin and ampicillin. Decision was made to avoid penicillins as the patient currently has a full body rash and has only been receiving Zosyn for the past 2 days.   The patient's SCr had been trending up over the past few days and has now come back down to 2.3 from 2.95 yesterday.  Pt continues to be febrile at 100.1 and  WBC is now trending back up (30.9 >> 9.3 > 12 > 15)   Plan 1. D/C Vanc/Zosyn 2. Start levofloxacin 750 mg IV q24h 3. Monitor QTc closely given concurrent use of ziprasidone and oxcarbazepine.  Temp (24hrs), Avg:99.2 F (37.3 C), Min:98.5 F (36.9 C), Max:100.1 F (37.8 C)   Recent Labs Lab 10/04/15 2350 10/05/15 0520 10/06/15 0439 10/07/15 0606 10/08/15 0356  WBC 13.7* 8.9 9.3 12.0* 15.0*     Recent Labs Lab 10/04/15 2350 10/05/15 0520 10/06/15 0439 10/07/15 0606 10/08/15 0356  CREATININE 1.86* 1.85* 1.98* 2.95* 2.30*   Estimated Creatinine Clearance: 70.2 mL/min (by C-G formula based on Cr of 2.3).    No Known Allergies  Antimicrobials this admission: Rocephin 12/17>>12/19 Clinda 12/19>>12/19 Vanc 12/18>>12/22 Zosyn 12/19>>12/22 Levofloxacin 12/22 >>  Levels/dose changes this admission: 12/20 VT @ 2150 >> 40 mcg/ml >> doses held 12/21 VR @ 1139 >> 33 mcg/ml >> doses held  Microbiology results: [facility] BCx 2/2 >> Group G Strep (S to amp, clinda, vanc, levofloxacin) 12/19 RCx >> moderate  candida albicans 12/18 BCx >> 1/2 CoNS 12/18 MRSA PCR >> positive  Thank you for allowing pharmacy to be a part of this patient's care. Please contact us if you have any questions.  Arcola JanskyMeagan Naylah Cork, PharmD Clinical Pharmacy Resident Pager: [phone]  10/08/2015 10:52 AM

## 2015-10-08 NOTE — Progress Notes (Signed)
PULMONARY / CRITICAL CARE MEDICINE   Name: Adrian [name]. MRN: [id number] DOB: 07/12/1961    ADMISSION DATE:  10/04/2015  REFERRING MD:  [facility]  CHIEF COMPLAINT: SOB  HISTORY OF PRESENT ILLNESS:   54 yo who presented  to [facility] 12/16 with SOB, he is 527 lbs and has OSA, also has drainage from left leg and panus consistent with cellulitis. He was treated with abx and BC revealed strep group h in 2/2. 12/18 he became hypoxic, ams and with general decline. [facility] did not have adequate nursing staff therefore he is being transported to [facility] ICU. Currently he is intubated but with bacteremia/sepsis and suspected aspiration pna he will mos tlikely need intubation. Note he bipolar and on sedatives which may be adding to his altered mental status. We will check 12 lead and CE for completeness along with pro calcitonin panel. He is on adequate abx.  SUBJECTIVE:  No acute issues overnight. Off sedation, awake and following commands. Denies pain. Remains on fio2 to 40 and PEEP of 10.  VITAL SIGNS: BP 200/74 mmHg  Pulse 101  Temp(Src) 100.1 F (37.8 C) (Oral)  Resp 29  Ht  (1.803 m)  Wt 224.984 kg (496 lb)  BMI 69.21 kg/m2  SpO2 86%  HEMODYNAMICS: CVP:  [11 mmHg-20 mmHg] 20 mmHg  VENTILATOR SETTINGS: Vent Mode:  [-] PRVC FiO2 (%):  [40 %-60 %] 60 % Set Rate:  [26 bmp] 26 bmp Vt Set:  [450 mL] 450 mL PEEP:  [5 cmH20-10 cmH20] 10 cmH20 Pressure Support:  [5 cmH20] 5 cmH20 Plateau Pressure:  [18 cmH20] 18 cmH20  INTAKE / OUTPUT: I/O last 3 completed shifts: In: 3229.3 [I.V.:1163.1; NG/GT:1891.3; IV Piggyback:175] Out: 4180 [Urine:4180]  PHYSICAL EXAMINATION: General:  Morbidly obese male on MV in respiratory distress on full support. Neuro:  Awakens to voice and touch, follows commands, moves all extremities to commands. HEENT:  ETT in place, Oral mucosa moist  Cardiovascular:  RRR, S1/ S2  Lungs: Unlabored on the vent, diminished airflow bilaterally. Abdomen:  Obese panus  dressing in place, panus wound is full thickness, 16x2x1.5 cm with yellow drainage, no odor. Musculoskeletal: No joint deformities. Skin:  Leg dressing in place, L heel wound.  LABS:  BMET  Recent Labs Lab 10/06/15 0439 10/07/15 0606 10/08/15 0356  NA 142 143 146*  K 3.8 4.7 3.9  CL 106 104 107  CO2 BUN 46* 66* 67*  CREATININE 1.98* 2.95* 2.30*  GLUCOSE 139* 101* 145*   Electrolytes  Recent Labs Lab 10/04/15 2350 10/05/15 0520 10/06/15 0439 10/07/15 0606 10/08/15 0356  CALCIUM 7.9* 7.6* 7.8* 7.9* 7.7*  MG 2.7* 2.5*  --  3.0* 3.0*  PHOS 6.8* 7.2*  --   --  5.4*   CBC  Recent Labs Lab 10/06/15 0439 10/07/15 0606 10/08/15 0356  WBC 9.3 12.0* 15.0*  HGB 7.3* 7.4* 7.7*  HCT 22.6* 23.7* 25.3*  PLT 206 225 302   Coag's  Recent Labs Lab 10/04/15 2350  APTT 44*  INR 1.23   Sepsis Markers  Recent Labs Lab 10/02/15 1139 10/02/15 1841 10/04/15 2350 10/05/15 0520 10/06/15 0439  LATICACIDVEN 3.4* 2.9* 0.9  --   --   PROCALCITON  --   --  12.16 12.70 7.99   ABG  Recent Labs Lab 10/06/15 0326 10/06/15 1807 10/08/15 0256  PHART 7.384 7.286* 7.346*  PCO2ART 43.5 58.8* 50.1*  PO2ART 220* 97.0 99.0   Liver Enzymes  Recent Labs Lab 10/02/15 1139  10/04/15 2350 10/06/15 0439  AST 28 106* 177*  ALT 26 95* 178*  ALKPHOS 83 79 86  BILITOT 0.7 0.5 0.3  ALBUMIN 3.5 2.5* 2.1*   Cardiac Enzymes  Recent Labs Lab 10/04/15 2350 10/05/15 0520 10/05/15 1113  TROPONINI 0.13* 0.07* 0.05*   Glucose  Recent Labs Lab 10/07/15 1134 10/07/15 1815 10/07/15 2030 10/07/15 2319 10/08/15 0344 10/08/15 0749  GLUCAP 109* 111* 121* 126* 136* 127*   Imaging Dg Chest Port 1 View  10/08/2015  CLINICAL DATA:  Endotracheal tube EXAM: PORTABLE CHEST 1 VIEW COMPARISON:  Chest x-ray from yesterday FINDINGS: Unremarkable endotracheal tube with tip at the clavicular heads. Orogastric tube is obscured inferiorly due to patient factors. Unchanged  positioning of left neck catheter with tip overlapping the aortic arch and left brachiocephalic vein. Specific recommendations were provided 10/06/2015. Unchanged diffuse interstitial and airspace opacity. No air leak or definite/increasing pleural effusion. IMPRESSION: 1. Unchanged positioning of tubes and vascular catheter, as above. 2. Stable extensive lung opacity in this patient with ARDS history. Electronically Signed   By: Marnee Spring M.D.   On: 10/08/2015 06:09   STUDIES:    CULTURES: 12/16 bc [facility]) >> Strep group g 2 of 2>>levaquin sensitive 12/16 [facility]) >> multiple species 12/18 urine >> negative 12/19 Resp >> moderate candida 12/18 Blood >> GPC clusters >> levaquin sensitive 12/21 Blood >>   ANTIBIOTICS: Ceftriaxone 12/17 >> 12/19 Vanco 12/18 >>12/22 Clinda 12/19 >> 12/19 Zosyn 12/19 >>12/22 Levaquin 12/22>>>  SIGNIFICANT EVENTS: 12/16 admit to [facility] with panus infection 12/18 Transfer to [facility] due to no ICU nurses at [facility].  LINES/TUBES: ETT 12/18 >>  L IJ TLC 12/20>>>  DISCUSSION: 527 lb male with panus and leg infections, sepsis, and hypoxic respiratory failure due to ALI superimposed on OHS +/- sedating meds. Intubated 12/18; remains on full vent support with PEEP of 10 12/21.   ASSESSMENT / PLAN:  PULMONARY A: ARDS-mild improvement on 12/21 CXR Suspected aspiration event with probable HCAP Hx of asthma  P:   - Failed weaning, will need tracheostomy, unable to tolerate weaning, trach in AM. - Increased PEEP to 10, will attempt to titrate down. - Diureses as ordered below.  INFECTIOUS A:   Cellulitis of panus  Group G strep bacteremia HCAP / aspiration PNA P:   - D/c zosyn. - Levaquin as ordered. - Appreciate WOC consult > dressing changes and other intervention per their recs - Echo with EF of 50%, may need TEE given MSSA bacteremia, will reculture today, if negative then no need for TEE.  CARDIOVASCULAR A:  HTN-BP stable on no  antihypertensives P:  - Continue to hold antihypertensives as normotensive on sedation.  - TEE this admission given Strep bacteremia - Lasix as below.  RENAL A:   Acute renal failure-Worsening creatinine 12/21 2.95(1.98 12/20) P:   - Follow BMP and UOP - Avoid nephrotoxic medications. - Lasix 40 mg IV q6 x3 doses. - Potassium PO.  GASTROINTESTINAL A:   GI protection Mild transaminitis, ? etiology P:   - PPI - Continue TF.  HEMATOLOGIC A:   Anemia of chronic disease; Hg stable between 7-8g/dl P:  - DVT protection with heparin. - Monitor Hg/HCT.  ENDOCRINE A:   Type 2 DM P:   - Blood glucose monitoring and SSI per protocol  NEUROLOGIC A:   Sedation for MV Bipolar disorder P:   RASS goal: -1 to -2 - Continue versed and fentanyl as ordered. - Bipolar medications ordered > trileptal and geodon  FAMILY  - Updates:  Spoke with mother and sister over the phone, will place trach in AM.  - Inter-disciplinary family meet or Palliative Care meeting due by:  12/25  The patient is critically ill with multiple organ systems failure and requires high complexity decision making for assessment and support, frequent evaluation and titration of therapies, application of advanced monitoring technologies and extensive interpretation of multiple databases.   Critical Care Time devoted to patient care services described in this note is  35  Minutes. This time reflects time of care of this signee Dr Koren BoundWesam Navaeh [name]. This critical care time does not reflect procedure time, or teaching time or supervisory time of PA/NP/Med student/Med Resident etc but could involve care discussion time.  Alyson ReedyWesam G. Vauda Salvucci, M.D. [facility] Pulmonary/Critical Care Medicine. Pager: [phone]. After hours pager: [phone].

## 2015-10-08 NOTE — Progress Notes (Signed)
Full body red rash noted on pt. Dr. Arsenio LoaderSommer notified and used the camera to visualize. No new orders received. Will continue to monitor.

## 2015-10-08 NOTE — Progress Notes (Signed)
Nutrition Follow-up  DOCUMENTATION CODES:   Morbid obesity  INTERVENTION:   Decrease Vital High Protein to 60 ml/hr 60 ml Prostat BID  Provides: 1840 kcal, 186 grams protein, and 1203 ml H2O.   NUTRITION DIAGNOSIS:   Inadequate oral intake related to inability to eat as evidenced by NPO status. Ongoing.   GOAL:   Provide needs based on ASPEN/SCCM guidelines Met.   MONITOR:   TF tolerance, Vent status, Labs, Skin, I & O's  ASSESSMENT:   54 year old, 514 lb male with panus and leg infections, sepsis. Hypoxic resp failure due to ALI. Intubated 12/18.   Patient is currently intubated on ventilator support MV: 11.8 L/min Temp (24hrs), Avg:99.9 F (37.7 C), Min:98.5 F (36.9 C), Max:102.2 F (39 C)  Medications reviewed and include: KCl Labs reviewed: sodium, phosphorus, and magnesium all elevated CBG's: 127-136 Plan for trach 12/22, renal function worsening.  Weight has decreased by 18 lb, pt is negative 1.5 L since admission  Diet Order:  Diet NPO time specified Diet NPO time specified  Skin:  Wound (see comment) (MSAD on abdomen and leg (cellulitis); laceration on abdomen)  Last BM:  PTA  Height:   Ht Readings from Last 1 Encounters:  10/04/15 '5\' 11"'  (1.803 m)   Weight:   Wt Readings from Last 1 Encounters:  10/08/15 496 lb (224.984 kg)   Ideal Body Weight:  78.2 kg  BMI:  Body mass index is 69.21 kg/(m^2).  Estimated Nutritional Needs:   Kcal:  1594-5859  Protein:  156-196 grams  Fluid:  1.7-1.9 L/day  EDUCATION NEEDS:   No education needs identified at this time  [place], [place], [place] Pager [phone] After Hours Pager

## 2015-10-08 NOTE — Progress Notes (Signed)
Dr. Claudette HeadW. Yacoub paged about pt temp 102.1 and little change with tylenol. New order received and cooling blanket to be placed shortly.

## 2015-10-08 NOTE — Progress Notes (Signed)
Utilization review completed.  

## 2015-10-09 ENCOUNTER — Inpatient Hospital Stay (HOSPITAL_COMMUNITY): Payer: Medicare Other

## 2015-10-09 DIAGNOSIS — I288 Other diseases of pulmonary vessels: Secondary | ICD-10-CM

## 2015-10-09 DIAGNOSIS — I10 Essential (primary) hypertension: Secondary | ICD-10-CM

## 2015-10-09 DIAGNOSIS — J9601 Acute respiratory failure with hypoxia: Secondary | ICD-10-CM | POA: Diagnosis present

## 2015-10-09 DIAGNOSIS — Z9289 Personal history of other medical treatment: Secondary | ICD-10-CM

## 2015-10-09 DIAGNOSIS — Z0189 Encounter for other specified special examinations: Secondary | ICD-10-CM | POA: Diagnosis present

## 2015-10-09 LAB — GLUCOSE, CAPILLARY
GLUCOSE-CAPILLARY: 100 mg/dL — AB (ref 65–99)
Glucose-Capillary: 104 mg/dL — ABNORMAL HIGH (ref 65–99)
Glucose-Capillary: 107 mg/dL — ABNORMAL HIGH (ref 65–99)
Glucose-Capillary: 113 mg/dL — ABNORMAL HIGH (ref 65–99)
Glucose-Capillary: 114 mg/dL — ABNORMAL HIGH (ref 65–99)

## 2015-10-09 LAB — CBC
HCT: 24.3 % — ABNORMAL LOW (ref 39.0–52.0)
Hemoglobin: 7.5 g/dL — ABNORMAL LOW (ref 13.0–17.0)
MCH: 28.6 pg (ref 26.0–34.0)
MCHC: 30.9 g/dL (ref 30.0–36.0)
MCV: 92.7 fL (ref 78.0–100.0)
PLATELETS: 337 10*3/uL (ref 150–400)
RBC: 2.62 MIL/uL — AB (ref 4.22–5.81)
RDW: 17.6 % — AB (ref 11.5–15.5)
WBC: 12.1 10*3/uL — AB (ref 4.0–10.5)

## 2015-10-09 LAB — BASIC METABOLIC PANEL
ANION GAP: 10 (ref 5–15)
BUN: 72 mg/dL — ABNORMAL HIGH (ref 6–20)
CO2: 31 mmol/L (ref 22–32)
Calcium: 8.2 mg/dL — ABNORMAL LOW (ref 8.9–10.3)
Chloride: 112 mmol/L — ABNORMAL HIGH (ref 101–111)
Creatinine, Ser: 2.19 mg/dL — ABNORMAL HIGH (ref 0.61–1.24)
GFR, EST AFRICAN AMERICAN: 37 mL/min — AB (ref >60)
GFR, EST NON AFRICAN AMERICAN: 32 mL/min — AB (ref >60)
Glucose, Bld: 116 mg/dL — ABNORMAL HIGH (ref 65–99)
Potassium: 4.1 mmol/L (ref 3.5–5.1)
Sodium: 153 mmol/L — ABNORMAL HIGH (ref 135–145)

## 2015-10-09 LAB — POCT I-STAT 3, ART BLOOD GAS (G3+)
ACID-BASE EXCESS: 8 mmol/L — AB (ref 0.0–2.0)
BICARBONATE: 33.2 meq/L — AB (ref 20.0–24.0)
O2 Saturation: 94 %
PCO2 ART: 55.9 mmHg — AB (ref 35.0–45.0)
PO2 ART: 80 mmHg (ref 80.0–100.0)
TCO2: 35 mmol/L (ref 0–100)
pH, Arterial: 7.388 (ref 7.350–7.450)

## 2015-10-09 LAB — PROTIME-INR
INR: 1.4 (ref 0.00–1.49)
PROTHROMBIN TIME: 17.3 s — AB (ref 11.6–15.2)

## 2015-10-09 LAB — APTT: aPTT: 32 seconds (ref 24–37)

## 2015-10-09 LAB — PHOSPHORUS: PHOSPHORUS: 4.7 mg/dL — AB (ref 2.5–4.6)

## 2015-10-09 LAB — MAGNESIUM: MAGNESIUM: 2.8 mg/dL — AB (ref 1.7–2.4)

## 2015-10-09 MED ORDER — FREE WATER: 300.0000 mL | Freq: Four times a day (QID) | Status: DC

## 2015-10-09 MED ORDER — FUROSEMIDE 10 MG/ML IJ SOLN
40.0000 mg | Freq: Three times a day (TID) | INTRAMUSCULAR | Status: AC
Start: 2015-10-09 — End: 2015-10-09
  Administered 2015-10-09 (×2): 40 mg via INTRAVENOUS
  Filled 2015-10-09 (×2): qty 4

## 2015-10-09 MED ORDER — ROCURONIUM BROMIDE 50 MG/5ML IV SOLN
100.0000 mg | Freq: Once | INTRAVENOUS | Status: AC
  Administered 2015-10-09: 100 mg via INTRAVENOUS
  Filled 2015-10-09: qty 10

## 2015-10-09 MED ORDER — ROCURONIUM BROMIDE 50 MG/5ML IV SOLN
1.0000 mg/kg | Freq: Once | INTRAVENOUS | Status: DC
  Filled 2015-10-09: qty 22.38

## 2015-10-09 MED ORDER — SODIUM CHLORIDE 0.9 % IV SOLN: INTRAVENOUS | Status: DC

## 2015-10-09 MED ORDER — FREE WATER
300.0000 mL | Status: DC
  Administered 2015-10-09 – 2015-10-10 (×3): 300 mL

## 2015-10-09 MED ORDER — SODIUM CHLORIDE 0.9 % IV SOLN: INTRAVENOUS | Status: DC | PRN

## 2015-10-09 NOTE — Progress Notes (Signed)
  Echocardiogram Echocardiogram Transesophageal has been performed.  Adrian [name], Adrian [name] 10/09/2015, 10:03 AM

## 2015-10-09 NOTE — Progress Notes (Signed)
Preliminary TEE report; indication-R/O SBE Patient on ventilator and sedated prior to procedure. TEE probe passed without difficult. Normal LV functon; no vegetations. Full report to follow. Olga MillersBrian Konstantine Gervasi

## 2015-10-09 NOTE — Procedures (Signed)
Bedside Tracheostomy Insertion Procedure Note   Patient Details:   Name: Adrian DiegoCarlton B Spearing Jr. DOB: April 09, 1961 MRN: [id number]  Procedure: Tracheostomy  Pre Procedure Assessment: ET Tube Size: 8.0 ET Tube secured at lip (cm): 25 Bite block in place: No Breath Sounds: Clear  Post Procedure Assessment: BP 147/70 mmHg  Pulse 117  Temp(Src) 98.9 F (37.2 C) (Core (Comment))  Resp 26  Ht 5\' 11"  (1.803 m)  Wt 493 lb 6.2 oz (223.8 kg)  BMI 68.84 kg/m2  SpO2 100% O2 sats: stable throughout Complications: No apparent complications Patient did tolerate procedure well Tracheostomy Brand:Shiley Tracheostomy Style:Cuffed Tracheostomy Size: 6 Tracheostomy Secured JYN:[id number]:Sutures , velcro Tracheostomy Placement Confirmation:Trach cuff visualized and in place and Chest X ray ordered for placement    Jacqulynn CadetHopper, Charday [place] David 10/09/2015, 11:04 AM

## 2015-10-09 NOTE — Procedures (Signed)
Percutaneous Tracheostomy Placement  Consent from family.  Patient sedated, paralyzed and position.  Placed on 100% FiO2 and RR matched.  Area cleaned and draped.  Lidocaine/epi injected.  Skin incision done followed by blunt dissection.  Trachea palpated then punctured, catheter passed and visualized bronchoscopically.  Wire placed and visualized.  Catheter removed.  Airway then crushed and dilated.  Size 6 cuffed shiley trach placed and visualized bronchoscopically well above carina.  Good volume returns.  Patient tolerated the procedure well without complications.  Minimal blood loss.  CXR ordered and pending.  Wesam G. Yacoub, M.D. [place] Pulmonary/Critical Care Medicine. Pager: [phone]. After hours pager: [phone]. 

## 2015-10-09 NOTE — Progress Notes (Signed)
PULMONARY / CRITICAL CARE MEDICINE   Name: Adrian [name]. MRN: [id number] DOB: [birth year]    ADMISSION DATE:  10/04/2015  REFERRING MD:  [facility]  CHIEF COMPLAINT: SOB  HISTORY OF PRESENT ILLNESS:   54 yo who presented  to [facility] 12/16 with SOB, he is 527 lbs and has OSA, also has drainage from left leg and panus consistent with cellulitis. He was treated with abx and BC revealed strep group h in 2/2. 12/18 he became hypoxic, ams and with general decline. [facility] did not have adequate nursing staff therefore he is being transported to [facility] ICU. Currently he is intubated but with bacteremia/sepsis and suspected aspiration pna he will mos tlikely need intubation. Note he bipolar and on sedatives which may be adding to his altered mental status. We will check 12 lead and CE for completeness along with pro calcitonin panel. He is on adequate abx.  SUBJECTIVE:  No acute issues overnight.   VITAL SIGNS: BP 147/70 mmHg  Pulse 84  Temp(Src) 98.9 F (37.2 C) (Core (Comment))  Resp 26  Ht  (1.803 m)  Wt 223.8 kg (493 lb 6.2 oz)  BMI 68.84 kg/m2  SpO2 97%  HEMODYNAMICS: CVP:  [9 mmHg-24 mmHg] 14 mmHg  VENTILATOR SETTINGS: Vent Mode:  [-] PRVC FiO2 (%):  [40 %] 40 % Set Rate:  [26 bmp] 26 bmp Vt Set:  [450 mL] 450 mL PEEP:  [5 cmH20] 5 cmH20 Plateau Pressure:  [16 cmH20-23 cmH20] 17 cmH20  INTAKE / OUTPUT: I/O last 3 completed shifts: In: 3739.8 [I.V.:1104.9; NG/GT:2384.9; IV Piggyback:250] Out: 23762 [Urine:10450]  PHYSICAL EXAMINATION: General:  Morbidly obese male on MV on full support, sedate. Neuro:  Awakens to voice and touch, follows commands, moves all extremities to commands. HEENT:  ETT in place, Oral mucosa moist  Cardiovascular:  RRR, S1/ S2  Lungs: Unlabored on the vent, diminished airflow bilaterally. Abdomen:  Obese panus dressing in place, panus wound is full thickness, 16x2x1.5 cm with yellow drainage, no odor. Musculoskeletal: No joint deformities. Skin:   Leg dressing in place, L heel wound.  LABS:  BMET  Recent Labs Lab 10/07/15 0606 10/08/15 0356 10/09/15 0448  NA 143 146* 153*  K 4.7 3.9 4.1  CL 104 107 112*  CO2 BUN 66* 67* 72*  CREATININE 2.95* 2.30* 2.19*  GLUCOSE 101* 145* 116*   Electrolytes  Recent Labs Lab 10/05/15 0520  10/07/15 0606 10/08/15 0356 10/09/15 0448  CALCIUM 7.6*  < > 7.9* 7.7* 8.2*  MG 2.5*  --  3.0* 3.0* 2.8*  PHOS 7.2*  --   --  5.4* 4.7*  < > = values in this interval not displayed. CBC  Recent Labs Lab 10/07/15 0606 10/08/15 0356 10/09/15 0448  WBC 12.0* 15.0* 12.1*  HGB 7.4* 7.7* 7.5*  HCT 23.7* 25.3* 24.3*  PLT 225 302 337   Coag's  Recent Labs Lab 10/04/15 2350 10/09/15 0448  APTT 44* 32  INR 1.23 1.40   Sepsis Markers  Recent Labs Lab 10/02/15 1841 10/04/15 2350 10/05/15 0520 10/06/15 0439  LATICACIDVEN 2.9* 0.9  --   --   PROCALCITON  --  12.16 12.70 7.99   ABG  Recent Labs Lab 10/06/15 1807 10/08/15 0256 10/09/15 0259  PHART 7.286* 7.346* 7.388  PCO2ART 58.8* 50.1* 55.9*  PO2ART 97.0 99.0 80.0   Liver Enzymes  Recent Labs Lab 10/04/15 2350 10/06/15 0439  AST 106* 177*  ALT 95* 178*  ALKPHOS 79 86  BILITOT  0.5 0.3  ALBUMIN 2.5* 2.1*   Cardiac Enzymes  Recent Labs Lab 10/04/15 2350 10/05/15 0520 10/05/15 1113  TROPONINI 0.13* 0.07* 0.05*   Glucose  Recent Labs Lab 10/08/15 1528 10/08/15 1942 10/08/15 1943 10/09/15 0003 10/09/15 0340 10/09/15 0832  GLUCAP 112* 109* 114* 114* 113* 107*   Imaging Dg Chest Port 1 View  10/09/2015  CLINICAL DATA:  ET tube placement EXAM: PORTABLE CHEST 1 VIEW COMPARISON:  10/08/2015 FINDINGS: The ET tube tip is above the carina. There is a left IJ catheter with tip overlying the aortic arch and left brachiocephalic vein. The NG tube tip is in the stomach. Stable cardiac enlargement and bilateral pleural effusions, left greater than right. Moderate interstitial edema is identified.  IMPRESSION: 1. Cardiac enlargement and CHF. 2. Stable position of support apparatus. Electronically Signed   By: Signa Kellaylor  Stroud M.D.   On: 10/09/2015 08:11   STUDIES:    CULTURES: 12/16 bc [facility] >> Strep group g 2 of 2>>levaquin sensitive 12/16 [facility] >> multiple species 12/18 urine >> negative 12/19 Resp >> moderate candida 12/18 Blood >> GPC clusters >> levaquin sensitive 12/21 Blood >>   ANTIBIOTICS: Ceftriaxone 12/17 >> 12/19 Vanco 12/18 >>12/22 Clinda 12/19 >> 12/19 Zosyn 12/19 >>12/22 Levaquin 12/22>>>  SIGNIFICANT EVENTS: [facility] 12[facility]18 [facility].  LINES/TUBES: ETT 12/18 >>  L IJ TLC 12/20>>>  DISCUSSION: 527 lb male with panus and leg infections, sepsis, and hypoxic respiratory failure due to ALI superimposed on OHS +/- sedating meds. Intubated 12/18; remains on full vent support with PEEP of 10 12/21.   ASSESSMENT / PLAN:  PULMONARY A: ARDS-mild improvement on 12/21 CXR Suspected aspiration event with probable HCAP Hx of asthma  P:   - Failed weaning, plan trach today. - Maintain on full vent support. - Diureses as ordered below.  INFECTIOUS A:   Cellulitis of panus  Group G strep bacteremia HCAP / aspiration PNA P:   - D/ced zosyn. - Levaquin as ordered. - Appreciate WOC consult > dressing changes and other intervention per their recs - Echo with EF of 50%. - TEE negative for vegetations.  CARDIOVASCULAR A:  HTN-BP stable on no antihypertensives P:  - Continue to hold antihypertensives as normotensive on sedation.  - TEE this admission given Strep bacteremia - Lasix as below.  RENAL A:   Acute renal failure-Worsening creatinine 12/21 2.95(1.98 12/20) P:   - Follow BMP and UOP - Avoid nephrotoxic medications. - Decrease lasix to 40 mg IV q8 hours x2 doses. - Potassium PO. - Free water 300 q6 hours.  GASTROINTESTINAL A:   GI protection Mild transaminitis, ?  etiology P:   - PPI - Continue TF.  HEMATOLOGIC A:   Anemia of chronic disease; Hg stable between 7-8g/dl P:  - DVT protection with heparin. - Monitor Hg/HCT.  ENDOCRINE A:   Type 2 DM P:   - Blood glucose monitoring and SSI per protocol  NEUROLOGIC A:   Sedation for MV Bipolar disorder P:   RASS goal: -1 to -2 - Decrease versed and fentanyl post trach - Bipolar medications ordered > trileptal and geodon  FAMILY  - Updates: No family bedside.  - Inter-disciplinary family meet or Palliative Care meeting due by:  12/25  The patient is critically ill with multiple organ systems failure and requires high complexity decision making for assessment and support, frequent evaluation and titration of therapies, application of advanced monitoring  technologies and extensive interpretation of multiple databases.   Critical Care Time devoted to patient care services described in this note is  35  Minutes. This time reflects time of care of this signee Dr Jennet Maduro. This critical care time does not reflect procedure time, or teaching time or supervisory time of PA/NP/Med student/Med Resident etc but could involve care discussion time.  Rush Farmer, M.D. [facility] Pulmonary/Critical Care Medicine. Pager: [phone]. After hours pager: [phone].

## 2015-10-09 NOTE — Procedures (Signed)
Bronchoscopy Procedure Note Adrian DiegoCarlton B Wank Jr. [id number] May 30, 1961  Procedure: Bronchoscopy Indications: Diagnostic evaluation of the airways  Procedure Details Consent: Risks of procedure as well as the alternatives and risks of each were explained to the (patient/caregiver).  Consent for procedure obtained. Time Out: Verified patient identification, verified procedure, site/side was marked, verified correct patient position, special equipment/implants available, medications/allergies/relevent history reviewed, required imaging and test results available.  Performed  In preparation for procedure, patient was given 100% FiO2 and bronchoscope lubricated. Sedation: per Dr Molli KnockYacoub  Airway entered and the following bronchi were examined: Bronchi.   Procedures performed: Brushings performed- no Bronchoscope removed.  , Patient placed back on 100% FiO2 at conclusion of procedure.    Evaluation Hemodynamic Status: BP wnl i; O2 sats: stable throughout Patient's Current Condition: stable Specimens:  None Complications: No apparent complications Patient did tolerate procedure well.   Nelda BucksFEINSTEIN,Adrian [name]   Placed bronch, back up ett direct vis to 18 cm, noted enmtire trach procedure , needle, catheter, wire, punch dil, progressive dila nd trach placement No post wall injury Assessed bronch through trach, carina 3 cm , no bleeding  Adrian Rossettianiel J. Tyson AliasFeinstein, MD, FACP Pgr: [phone]  Pulmonary & Critical Care

## 2015-10-10 ENCOUNTER — Inpatient Hospital Stay (HOSPITAL_COMMUNITY): Payer: Medicare Other

## 2015-10-10 DIAGNOSIS — N170 Acute kidney failure with tubular necrosis: Secondary | ICD-10-CM

## 2015-10-10 LAB — CBC
HEMATOCRIT: 27.2 % — AB (ref 39.0–52.0)
HEMOGLOBIN: 8.2 g/dL — AB (ref 13.0–17.0)
MCH: 28.5 pg (ref 26.0–34.0)
MCHC: 30.1 g/dL (ref 30.0–36.0)
MCV: 94.4 fL (ref 78.0–100.0)
Platelets: 375 10*3/uL (ref 150–400)
RBC: 2.88 MIL/uL — ABNORMAL LOW (ref 4.22–5.81)
RDW: 17.9 % — AB (ref 11.5–15.5)
WBC: 11.5 10*3/uL — ABNORMAL HIGH (ref 4.0–10.5)

## 2015-10-10 LAB — BASIC METABOLIC PANEL
ANION GAP: 10 (ref 5–15)
Anion gap: 11 (ref 5–15)
Anion gap: 11 (ref 5–15)
Anion gap: 11 (ref 5–15)
Anion gap: 12 (ref 5–15)
BUN: 68 mg/dL — AB (ref 6–20)
BUN: 69 mg/dL — AB (ref 6–20)
BUN: 71 mg/dL — AB (ref 6–20)
BUN: 71 mg/dL — AB (ref 6–20)
BUN: 73 mg/dL — ABNORMAL HIGH (ref 6–20)
CALCIUM: 8.9 mg/dL (ref 8.9–10.3)
CALCIUM: 9 mg/dL (ref 8.9–10.3)
CALCIUM: 9.1 mg/dL (ref 8.9–10.3)
CHLORIDE: 114 mmol/L — AB (ref 101–111)
CHLORIDE: 115 mmol/L — AB (ref 101–111)
CO2: 31 mmol/L (ref 22–32)
CO2: 32 mmol/L (ref 22–32)
CO2: 32 mmol/L (ref 22–32)
CO2: 32 mmol/L (ref 22–32)
CO2: 32 mmol/L (ref 22–32)
CREATININE: 1.92 mg/dL — AB (ref 0.61–1.24)
CREATININE: 1.97 mg/dL — AB (ref 0.61–1.24)
Calcium: 8.7 mg/dL — ABNORMAL LOW (ref 8.9–10.3)
Calcium: 8.7 mg/dL — ABNORMAL LOW (ref 8.9–10.3)
Chloride: 113 mmol/L — ABNORMAL HIGH (ref 101–111)
Chloride: 113 mmol/L — ABNORMAL HIGH (ref 101–111)
Chloride: 112 mmol/L — ABNORMAL HIGH (ref 101–111)
Creatinine, Ser: 1.83 mg/dL — ABNORMAL HIGH (ref 0.61–1.24)
Creatinine, Ser: 1.68 mg/dL — ABNORMAL HIGH (ref 0.61–1.24)
Creatinine, Ser: 1.67 mg/dL — ABNORMAL HIGH (ref 0.61–1.24)
GFR calc Af Amer: 43 mL/min — ABNORMAL LOW (ref >60)
GFR calc Af Amer: 47 mL/min — ABNORMAL LOW (ref >60)
GFR calc Af Amer: 52 mL/min — ABNORMAL LOW (ref >60)
GFR calc Af Amer: 52 mL/min — ABNORMAL LOW (ref >60)
GFR calc non Af Amer: 37 mL/min — ABNORMAL LOW (ref >60)
GFR calc non Af Amer: 38 mL/min — ABNORMAL LOW (ref >60)
GFR, EST AFRICAN AMERICAN: 44 mL/min — AB (ref >60)
GFR, EST NON AFRICAN AMERICAN: 40 mL/min — AB (ref >60)
GFR, EST NON AFRICAN AMERICAN: 45 mL/min — AB (ref >60)
GFR, EST NON AFRICAN AMERICAN: 45 mL/min — AB (ref >60)
GLUCOSE: 130 mg/dL — AB (ref 65–99)
GLUCOSE: 135 mg/dL — AB (ref 65–99)
GLUCOSE: 142 mg/dL — AB (ref 65–99)
GLUCOSE: 143 mg/dL — AB (ref 65–99)
GLUCOSE: 153 mg/dL — AB (ref 65–99)
POTASSIUM: 3.4 mmol/L — AB (ref 3.5–5.1)
Potassium: 4 mmol/L (ref 3.5–5.1)
Potassium: 4 mmol/L (ref 3.5–5.1)
Potassium: 4 mmol/L (ref 3.5–5.1)
Potassium: 3.8 mmol/L (ref 3.5–5.1)
Sodium: 157 mmol/L — ABNORMAL HIGH (ref 135–145)
Sodium: 156 mmol/L — ABNORMAL HIGH (ref 135–145)
Sodium: 156 mmol/L — ABNORMAL HIGH (ref 135–145)
Sodium: 156 mmol/L — ABNORMAL HIGH (ref 135–145)
Sodium: 156 mmol/L — ABNORMAL HIGH (ref 135–145)

## 2015-10-10 LAB — GLUCOSE, CAPILLARY
GLUCOSE-CAPILLARY: 115 mg/dL — AB (ref 65–99)
GLUCOSE-CAPILLARY: 131 mg/dL — AB (ref 65–99)
GLUCOSE-CAPILLARY: 118 mg/dL — AB (ref 65–99)
Glucose-Capillary: 121 mg/dL — ABNORMAL HIGH (ref 65–99)
Glucose-Capillary: 132 mg/dL — ABNORMAL HIGH (ref 65–99)
Glucose-Capillary: 126 mg/dL — ABNORMAL HIGH (ref 65–99)

## 2015-10-10 LAB — CULTURE, BLOOD (ROUTINE X 2): Culture: NO GROWTH

## 2015-10-10 LAB — BLOOD GAS, ARTERIAL
ACID-BASE EXCESS: 4.3 mmol/L — AB (ref 0.0–2.0)
Bicarbonate: 29.3 mEq/L — ABNORMAL HIGH (ref 20.0–24.0)
Drawn by: 283401
FIO2: 0.4
O2 SAT: 98.3 %
PEEP/CPAP: 5 cmH2O
PH ART: 7.363 (ref 7.350–7.450)
Patient temperature: 98.6
RATE: 26 resp/min
TCO2: 30.9 mmol/L (ref 0–100)
VT: 450 mL
pCO2 arterial: 52.8 mmHg — ABNORMAL HIGH (ref 35.0–45.0)
pO2, Arterial: 118 mmHg — ABNORMAL HIGH (ref 80.0–100.0)

## 2015-10-10 LAB — TROPONIN I
TROPONIN I: 0.03 ng/mL (ref <0.031)
TROPONIN I: 0.04 ng/mL — AB (ref <0.031)
TROPONIN I: 0.04 ng/mL — AB (ref <0.031)

## 2015-10-10 LAB — BLOOD GAS, VENOUS
Acid-Base Excess: 4.2 mmol/L — ABNORMAL HIGH (ref 0.0–3.0)
BICARBONATE: 28.4 meq/L — AB (ref 21.0–28.0)
PCO2 VEN: 40 mmHg — AB (ref 44.0–60.0)
PH VEN: 7.46 — AB (ref 7.320–7.430)
Patient temperature: 37

## 2015-10-10 LAB — PHOSPHORUS: PHOSPHORUS: 4.7 mg/dL — AB (ref 2.5–4.6)

## 2015-10-10 LAB — MAGNESIUM
Magnesium: 2.6 mg/dL — ABNORMAL HIGH (ref 1.7–2.4)
Magnesium: 2.6 mg/dL — ABNORMAL HIGH (ref 1.7–2.4)

## 2015-10-10 MED ORDER — DEXMEDETOMIDINE HCL IN NACL 400 MCG/100ML IV SOLN
0.4000 ug/kg/h | INTRAVENOUS | Status: DC
  Administered 2015-10-10 (×2): 0.4 ug/kg/h via INTRAVENOUS
  Administered 2015-10-10: 0.8 ug/kg/h via INTRAVENOUS
  Administered 2015-10-11: 0.6 ug/kg/h via INTRAVENOUS
  Administered 2015-10-11 (×2): 1 ug/kg/h via INTRAVENOUS
  Administered 2015-10-11 (×2): 0.8 ug/kg/h via INTRAVENOUS
  Administered 2015-10-11: 0.4 ug/kg/h via INTRAVENOUS
  Administered 2015-10-11 – 2015-10-12 (×2): 1 ug/kg/h via INTRAVENOUS
  Administered 2015-10-12: 1.2 ug/kg/h via INTRAVENOUS
  Administered 2015-10-12: 0.4 ug/kg/h via INTRAVENOUS
  Administered 2015-10-12: 1.2 ug/kg/h via INTRAVENOUS
  Administered 2015-10-12 (×2): 1 ug/kg/h via INTRAVENOUS
  Filled 2015-10-10: qty 50
  Filled 2015-10-10 (×7): qty 100
  Filled 2015-10-10: qty 50
  Filled 2015-10-10: qty 100
  Filled 2015-10-10: qty 200
  Filled 2015-10-10 (×2): qty 100
  Filled 2015-10-10 (×2): qty 50
  Filled 2015-10-10 (×7): qty 100

## 2015-10-10 MED ORDER — FUROSEMIDE 10 MG/ML IJ SOLN
40.0000 mg | Freq: Three times a day (TID) | INTRAMUSCULAR | Status: AC
  Administered 2015-10-10 (×2): 40 mg via INTRAVENOUS
  Filled 2015-10-10 (×2): qty 4

## 2015-10-10 MED ORDER — FREE WATER
400.0000 mL | Status: DC
  Administered 2015-10-10 – 2015-10-13 (×20): 400 mL

## 2015-10-10 MED ORDER — POTASSIUM CHLORIDE 20 MEQ/15ML (10%) PO SOLN
40.0000 meq | Freq: Three times a day (TID) | ORAL | Status: AC
  Administered 2015-10-10 (×2): 40 meq
  Filled 2015-10-10 (×2): qty 30

## 2015-10-10 MED ORDER — DEXTROSE 5 % IV SOLN
INTRAVENOUS | Status: DC
  Administered 2015-10-10: 06:00:00 via INTRAVENOUS

## 2015-10-10 NOTE — Progress Notes (Signed)
Patient HR jumped from 80's to 120's. Performed EKG showing patient in Aflutter. Notified Cardiology, who deferred to critical care management team.  Will continue to monitor patient. Milon DikesKristina D Zyshonne Malecha, RN

## 2015-10-10 NOTE — Progress Notes (Signed)
PULMONARY / CRITICAL CARE MEDICINE   Name: Adrian [name]. MRN: [id number] DOB: April 10, 1961    ADMISSION DATE:  10/04/2015  REFERRING MD:  [facility]  CHIEF COMPLAINT: SOB  HISTORY OF PRESENT ILLNESS:   54 yo who presented  to [facility] 12/16 with SOB, he is 527 lbs and has OSA, also has drainage from left leg and panus consistent with cellulitis. He was treated with abx and BC revealed strep group h in 2/2. 12/18 he became hypoxic, ams and with general decline. [facility] did not have adequate nursing staff therefore he is being transported to [facility]. Currently he is intubated but with bacteremia/sepsis and suspected aspiration pna he will mos tlikely need intubation. Note he bipolar and on sedatives which may be adding to his altered mental status. We will check 12 lead and CE for completeness along with pro calcitonin panel. He is on adequate abx.  SUBJECTIVE:  Trached 12/23, sedate this AM, no events.   VITAL SIGNS: BP 200/98 mmHg  Pulse 94  Temp(Src) 98.7 F (37.1 C) (Core (Comment))  Resp 20  Ht  (1.803 m)  Wt 213.191 kg (470 lb)  BMI 65.58 kg/m2  SpO2 95%  HEMODYNAMICS: CVP:  [3 mmHg-14 mmHg] 3 mmHg  VENTILATOR SETTINGS: Vent Mode:  [-] PRVC FiO2 (%):  [40 %] 40 % Set Rate:  [26 bmp] 26 bmp Vt Set:  [450 mL] 450 mL PEEP:  [5 cmH20] 5 cmH20 Plateau Pressure:  [8 cmH20-16 cmH20] 8 cmH20  INTAKE / OUTPUT: I/O last 3 completed shifts: In: 4476.6 [I.V.:1693.5; NG/GT:2633.2; IV Piggyback:150] Out: 9450 [Urine:9450]  PHYSICAL EXAMINATION: General:  Morbidly obese male on MV on full support, sedate. Neuro:  Awakens to voice and touch, follows commands, moves all extremities to commands. HEENT:  ETT in place, Oral mucosa moist  Cardiovascular:  RRR, S1/ S2  Lungs: Unlabored on the vent, diminished airflow bilaterally. Abdomen:  Obese panus dressing in place, panus wound is full thickness, 16x2x1.5 cm with yellow drainage, no odor. Musculoskeletal: No joint  deformities. Skin:  Leg dressing in place, L heel wound.  LABS:  BMET  Recent Labs Lab 10/09/15 0448 10/10/15 0030 10/10/15 0429  NA 153* 157* 156*  K 4.1 4.0 4.0  CL 112* 115* 114*  CO2 31 31 32  BUN 72* 71* 73*  CREATININE 2.19* 1.97* 1.92*  GLUCOSE 116* 135* 130*   Electrolytes  Recent Labs Lab 10/08/15 0356 10/09/15 0448 10/10/15 0030 10/10/15 0429  CALCIUM 7.7* 8.2* 8.7* 8.7*  MG 3.0* 2.8* 2.6* 2.6*  PHOS 5.4* 4.7*  --  4.7*   CBC  Recent Labs Lab 10/08/15 0356 10/09/15 0448 10/10/15 0429  WBC 15.0* 12.1* 11.5*  HGB 7.7* 7.5* 8.2*  HCT 25.3* 24.3* 27.2*  PLT 302 337 375   Coag's  Recent Labs Lab 10/04/15 2350 10/09/15 0448  APTT 44* 32  INR 1.23 1.40   Sepsis Markers  Recent Labs Lab 10/04/15 2350 10/05/15 0520 10/06/15 0439  LATICACIDVEN 0.9  --   --   PROCALCITON 12.16 12.70 7.99   ABG  Recent Labs Lab 10/08/15 0256 10/09/15 0259 10/10/15 0350  PHART 7.346* 7.388 7.363  PCO2ART 50.1* 55.9* 52.8*  PO2ART 99.0 80.0 118*   Liver Enzymes  Recent Labs Lab 10/04/15 2350 10/06/15 0439  AST 106* 177*  ALT 95* 178*  ALKPHOS 79 86  BILITOT 0.5 0.3  ALBUMIN 2.5* 2.1*   Cardiac Enzymes  Recent Labs Lab 10/05/15 1113 10/10/15 0030 10/10/15 0400  TROPONINI 0.05*  0.04* 0.04*   Glucose  Recent Labs Lab 10/09/15 0832 10/09/15 1204 10/09/15 1648 10/09/15 2008 10/09/15 2344 10/10/15 0401  GLUCAP 107* 104* 100* 121* 132* 115*   Imaging Dg Chest Port 1 View  10/10/2015  CLINICAL DATA:  Post tracheostomy placement. EXAM: PORTABLE CHEST 1 VIEW COMPARISON:  10/09/2015 FINDINGS: Tracheostomy tube is unchanged. Central venous catheter sheath from left internal jugular approach is unchanged. Feeding catheter is collimated off the image. Cardiomediastinal silhouette is enlarged. Mediastinal contours appear intact. There is persistent right more than left haziness of the lung parenchyma and peribronchovascular opacities  suggestive of edema. There are bilateral probably moderate in size pleural effusions. There is probable left lower lobe atelectasis. No evidence of pneumothorax. Osseous structures are without acute abnormality. Soft tissues are grossly normal. IMPRESSION: Persistently enlarged cardiac silhouette with pulmonary edema and bilateral pleural effusions. Probable left lower lobe atelectasis. Electronically Signed   By: Ted Mcalpine M.D.   On: 10/10/2015 09:10   Dg Chest Port 1 View  10/09/2015  CLINICAL DATA:  Recent tracheostomy placement EXAM: PORTABLE CHEST - 1 VIEW COMPARISON:  10/09/2015 FINDINGS: Left jugular line is stable. The nasogastric catheter has been removed as has the endotracheal tube. A new tracheostomy is seen in satisfactory position. Diffuse vascular congestion is noted with mild CHF. Small bilateral pleural effusions arch again seen. IMPRESSION: Tracheostomy in satisfactory position. The remainder of the exam is stable. Electronically Signed   By: Alcide Clever M.D.   On: 10/09/2015 12:25   Dg Abd Portable 1v  10/09/2015  CLINICAL DATA:  Examination to confirm NG tube placement EXAM: PORTABLE ABDOMEN - 1 VIEW COMPARISON:  Chest x-ray 10/09/2015 FINDINGS: The study is limited by patient's large body habitus. There is NG tube with tip in upper pelvis probable within distal elongated stomach. Mild gaseous distended small bowel loop in left abdomen. IMPRESSION: NG tube tip within upper pelvis probable within elongated distal stomach. Mild gaseous distended small bowel loop in left abdomen. Electronically Signed   By: Natasha Mead M.D.   On: 10/09/2015 14:20   STUDIES:  12/23 TEE with no vegetations  CULTURES: 12/16 bc [facility]) >> Strep group g 2 of 2>>levaquin sensitive 12/16 [facility]) >> multiple species 12/18 urine >> negative 12/19 Resp >> moderate candida 12/18 Blood >> GPC clusters >> levaquin sensitive 12/21 Blood >>   ANTIBIOTICS: Ceftriaxone 12/17 >> 12/19 Vanco 12/18  >>12/22 Clinda 12/19 >> 12/19 Zosyn 12/19 >>12/22 Levaquin 12/22>>>  SIGNIFICANT EVENTS: 12/16 admit to [facility] with panus infection 12/18 Transfer to [facility] due to no ICU nurses at [facility].  LINES/TUBES: ETT 12/18 >>12/23 Trach (JY) 12/23>>> L IJ TLC 12/20>>>  DISCUSSION: Morbidly obese male with panus and leg infections, sepsis, and hypoxic respiratory failure due to ALI superimposed on OHS +/- sedating meds. Intubated 12/18; remains on full vent support with PEEP of 10 12/21.   ASSESSMENT / PLAN:  PULMONARY A: ARDS-mild improvement on 12/21 CXR Suspected aspiration event with probable HCAP Hx of asthma  P:   - Trach in place begin PS trials. - PS trials. - Diureses as ordered below.  INFECTIOUS A:   Cellulitis of panus  Group G strep bacteremia HCAP / aspiration PNA P:   - Levaquin as ordered. - Appreciate WOC consult > dressing changes and other intervention per their recs - Echo with EF of 50%. - TEE negative for vegetations.  CARDIOVASCULAR A:  HTN-BP stable on no antihypertensives P:  - Continue to hold antihypertensives as normotensive on sedation.  -  TEE negative for vegetation - Lasix as below.  RENAL A:   Acute renal failure-Worsening creatinine 12/21 2.95(1.98 12/20) P:   - Follow BMP and UOP - Avoid nephrotoxic medications. - Decrease lasix to 40 mg IV q8 hours x2 doses. - Potassium PO. - Free water 400 q6 hours.  GASTROINTESTINAL A:   GI protection Mild transaminitis, ? etiology P:   - PPI - Continue TF.  HEMATOLOGIC A:   Anemia of chronic disease; Hg stable between 7-8g/dl P:  - DVT protection with heparin. - Monitor Hg/HCT.  ENDOCRINE A:   Type 2 DM P:   - Blood glucose monitoring and SSI per protocol  NEUROLOGIC A:   Sedation for MV Bipolar disorder P:   RASS goal: -1 to -2 - D/C versed. - Fentanyl drip. - Precedex. - Bipolar medications ordered > trileptal and geodon  FAMILY  - Updates: No family bedside.  -  Inter-disciplinary family meet or Palliative Care meeting due by:  12/25  The patient is critically ill with multiple organ systems failure and requires high complexity decision making for assessment and support, frequent evaluation and titration of therapies, application of advanced monitoring technologies and extensive interpretation of multiple databases.   Critical Care Time devoted to patient care services described in this note is  35  Minutes. This time reflects time of care of this signee Dr Koren BoundWesam Yacoub. This critical care time does not reflect procedure time, or teaching time or supervisory time of PA/NP/Med student/Med Resident etc but could involve care discussion time.  Alyson ReedyWesam G. Yacoub, M.D. [facility] Pulmonary/Critical Care Medicine. Pager: [phone]. After hours pager: [phone].

## 2015-10-10 NOTE — Progress Notes (Signed)
eLink Physician-Brief Progress Note Patient Name: Adrian DiegoCarlton B Bullinger Jr. DOB: 11-29-1960 MRN: [id number]   Date of Service  10/10/2015  HPI/Events of Note   patient now in A. fib/flutter. Previous transesophageal echocardiogram normal. Nurse notify cardiology per cardiology deferred treatment to critical care.   eICU Interventions  1.  stat BMP & magnesium  2. Trending troponin I every 6 hours  3. Continue monitoring on telemetry. 4. Hold off on systemic anticoagulation given tracheostomy placement today      Intervention Category Major Interventions: Arrhythmia - evaluation and management  Lawanda CousinsJennings Dannia Snook 10/10/2015, 12:03 AM

## 2015-10-10 NOTE — Progress Notes (Signed)
eLink Physician-Brief Progress Note Patient Name: Adrian DiegoCarlton B Jallow Jr. DOB: 1961/02/26 MRN: [id number]   Date of Service  10/10/2015  HPI/Events of Note  Persistent hypernatremia despite Free Water. ~10.5L deficit.  eICU Interventions  1. Increase Free Water to 400cc q4hr 2. D5W 100cc/hr 3. BMP q4hr starting 9am     Intervention Category Major Interventions: Electrolyte abnormality - evaluation and management  Lawanda CousinsJennings Shwanda Soltis 10/10/2015, 4:33 AM

## 2015-10-11 DIAGNOSIS — I10 Essential (primary) hypertension: Secondary | ICD-10-CM

## 2015-10-11 LAB — BASIC METABOLIC PANEL
ANION GAP: 10 (ref 5–15)
ANION GAP: 10 (ref 5–15)
ANION GAP: 11 (ref 5–15)
ANION GAP: 11 (ref 5–15)
ANION GAP: 12 (ref 5–15)
ANION GAP: 13 (ref 5–15)
BUN: 67 mg/dL — ABNORMAL HIGH (ref 6–20)
BUN: 68 mg/dL — ABNORMAL HIGH (ref 6–20)
BUN: 68 mg/dL — ABNORMAL HIGH (ref 6–20)
BUN: 70 mg/dL — ABNORMAL HIGH (ref 6–20)
BUN: 70 mg/dL — ABNORMAL HIGH (ref 6–20)
BUN: 71 mg/dL — ABNORMAL HIGH (ref 6–20)
CALCIUM: 8.8 mg/dL — AB (ref 8.9–10.3)
CALCIUM: 8.9 mg/dL (ref 8.9–10.3)
CALCIUM: 8.9 mg/dL (ref 8.9–10.3)
CALCIUM: 9 mg/dL (ref 8.9–10.3)
CHLORIDE: 114 mmol/L — AB (ref 101–111)
CO2: 30 mmol/L (ref 22–32)
CO2: 30 mmol/L (ref 22–32)
CO2: 30 mmol/L (ref 22–32)
CO2: 30 mmol/L (ref 22–32)
CO2: 31 mmol/L (ref 22–32)
CO2: 31 mmol/L (ref 22–32)
CREATININE: 1.71 mg/dL — AB (ref 0.61–1.24)
CREATININE: 1.66 mg/dL — AB (ref 0.61–1.24)
CREATININE: 1.6 mg/dL — AB (ref 0.61–1.24)
CREATININE: 1.61 mg/dL — AB (ref 0.61–1.24)
Calcium: 8.9 mg/dL (ref 8.9–10.3)
Calcium: 9 mg/dL (ref 8.9–10.3)
Chloride: 114 mmol/L — ABNORMAL HIGH (ref 101–111)
Chloride: 113 mmol/L — ABNORMAL HIGH (ref 101–111)
Chloride: 113 mmol/L — ABNORMAL HIGH (ref 101–111)
Chloride: 113 mmol/L — ABNORMAL HIGH (ref 101–111)
Chloride: 114 mmol/L — ABNORMAL HIGH (ref 101–111)
Creatinine, Ser: 1.65 mg/dL — ABNORMAL HIGH (ref 0.61–1.24)
Creatinine, Ser: 1.71 mg/dL — ABNORMAL HIGH (ref 0.61–1.24)
GFR calc non Af Amer: 44 mL/min — ABNORMAL LOW (ref >60)
GFR calc non Af Amer: 44 mL/min — ABNORMAL LOW (ref >60)
GFR calc non Af Amer: 47 mL/min — ABNORMAL LOW (ref >60)
GFR, EST AFRICAN AMERICAN: 51 mL/min — AB (ref >60)
GFR, EST AFRICAN AMERICAN: 51 mL/min — AB (ref >60)
GFR, EST AFRICAN AMERICAN: 52 mL/min — AB (ref >60)
GFR, EST AFRICAN AMERICAN: 53 mL/min — AB (ref >60)
GFR, EST AFRICAN AMERICAN: 54 mL/min — AB (ref >60)
GFR, EST AFRICAN AMERICAN: 55 mL/min — AB (ref >60)
GFR, EST NON AFRICAN AMERICAN: 46 mL/min — AB (ref >60)
GFR, EST NON AFRICAN AMERICAN: 45 mL/min — AB (ref >60)
GFR, EST NON AFRICAN AMERICAN: 47 mL/min — AB (ref >60)
Glucose, Bld: 140 mg/dL — ABNORMAL HIGH (ref 65–99)
Glucose, Bld: 149 mg/dL — ABNORMAL HIGH (ref 65–99)
Glucose, Bld: 151 mg/dL — ABNORMAL HIGH (ref 65–99)
Glucose, Bld: 151 mg/dL — ABNORMAL HIGH (ref 65–99)
Glucose, Bld: 161 mg/dL — ABNORMAL HIGH (ref 65–99)
Glucose, Bld: 161 mg/dL — ABNORMAL HIGH (ref 65–99)
POTASSIUM: 3.2 mmol/L — AB (ref 3.5–5.1)
Potassium: 3.6 mmol/L (ref 3.5–5.1)
Potassium: 4.2 mmol/L (ref 3.5–5.1)
Potassium: 4.2 mmol/L (ref 3.5–5.1)
Potassium: 4 mmol/L (ref 3.5–5.1)
Potassium: 4.2 mmol/L (ref 3.5–5.1)
SODIUM: 154 mmol/L — AB (ref 135–145)
SODIUM: 155 mmol/L — AB (ref 135–145)
SODIUM: 155 mmol/L — AB (ref 135–145)
SODIUM: 156 mmol/L — AB (ref 135–145)
SODIUM: 156 mmol/L — AB (ref 135–145)
Sodium: 154 mmol/L — ABNORMAL HIGH (ref 135–145)

## 2015-10-11 LAB — CBC
HCT: 27.4 % — ABNORMAL LOW (ref 39.0–52.0)
HEMOGLOBIN: 8.5 g/dL — AB (ref 13.0–17.0)
MCH: 29.1 pg (ref 26.0–34.0)
MCHC: 31 g/dL (ref 30.0–36.0)
MCV: 93.8 fL (ref 78.0–100.0)
PLATELETS: 349 10*3/uL (ref 150–400)
RBC: 2.92 MIL/uL — AB (ref 4.22–5.81)
RDW: 17.6 % — ABNORMAL HIGH (ref 11.5–15.5)
WBC: 10.1 10*3/uL (ref 4.0–10.5)

## 2015-10-11 LAB — GLUCOSE, CAPILLARY
GLUCOSE-CAPILLARY: 137 mg/dL — AB (ref 65–99)
GLUCOSE-CAPILLARY: 125 mg/dL — AB (ref 65–99)
GLUCOSE-CAPILLARY: 127 mg/dL — AB (ref 65–99)
Glucose-Capillary: 132 mg/dL — ABNORMAL HIGH (ref 65–99)
Glucose-Capillary: 137 mg/dL — ABNORMAL HIGH (ref 65–99)
Glucose-Capillary: 118 mg/dL — ABNORMAL HIGH (ref 65–99)
Glucose-Capillary: 113 mg/dL — ABNORMAL HIGH (ref 65–99)

## 2015-10-11 LAB — PHOSPHORUS: PHOSPHORUS: 4.5 mg/dL (ref 2.5–4.6)

## 2015-10-11 LAB — MAGNESIUM: MAGNESIUM: 2.2 mg/dL (ref 1.7–2.4)

## 2015-10-11 MED ORDER — DEXTROSE 5 % IV SOLN
INTRAVENOUS | Status: DC
  Administered 2015-10-13: 05:00:00 via INTRAVENOUS

## 2015-10-11 MED ORDER — FUROSEMIDE 10 MG/ML IJ SOLN
40.0000 mg | Freq: Three times a day (TID) | INTRAMUSCULAR | Status: AC
  Administered 2015-10-11 (×2): 40 mg via INTRAVENOUS
  Filled 2015-10-11 (×2): qty 4

## 2015-10-11 MED ORDER — POTASSIUM CHLORIDE 20 MEQ/15ML (10%) PO SOLN
40.0000 meq | Freq: Once | ORAL | Status: AC
  Administered 2015-10-11: 40 meq
  Filled 2015-10-11: qty 30

## 2015-10-11 MED ORDER — POTASSIUM CHLORIDE 20 MEQ/15ML (10%) PO SOLN
40.0000 meq | Freq: Three times a day (TID) | ORAL | Status: AC
  Administered 2015-10-11 (×2): 40 meq
  Filled 2015-10-11 (×2): qty 30

## 2015-10-11 NOTE — Progress Notes (Signed)
Patient doing well at this time on 28% ATC. Vitals are as follows: HR 83., SPO2 96, RR 21, BP 151/74. RT will leave patient on trach collar at this time and will monitor as needed.

## 2015-10-11 NOTE — Progress Notes (Signed)
Pharmacy Antibiotic Follow-up Note  Adrian Adrian B Kewley Jr. is a 54 y.o. year-old male admitted on 10/04/2015.  The patient is currently on day 7 of ABX for cellulitis/sepsis/aspiration PNA  Assessment/Plan: [age] YOM who was on Vancomycin + Zosyn for empiric cellulitis/sepsis/aspiration PNA coverage [facility] Group G Strep. Decision was made to avoid penicillins as the patient had full body rash after recieveing Zosyn.  The patient's SCr has trended down and is now 1.65. Patient is afebrile and WBC had begun to trend down. 1  Pt continues to be febrile at 100.1 and  WBC is now trending back up (15 > 12 > 11.5 > 10)   Plan 1. Continue levofloxacin 750 mg IV q24h 3. Monitor QTc closely given concurrent use of ziprasidone and oxcarbazepine.  Temp (24hrs), Avg:99.2 F (37.3 C), Min:99 F (37.2 C), Max:99.3 F (37.4 C)   Recent Labs Lab 10/07/15 0606 10/08/15 0356 10/09/15 0448 10/10/15 0429 10/11/15 0425  WBC 12.0* 15.0* 12.1* 11.5* 10.1     Recent Labs Lab 10/10/15 1737 10/10/15 2100 10/11/15 0014 10/11/15 0425 10/11/15 0917  CREATININE 1.68* 1.67* 1.71* 1.71* 1.65*   Estimated Creatinine Clearance: 94.5 mL/min (by C-G formula based on Cr of 1.65).    No Known Allergies  Antimicrobials this admission: Rocephin 12/17>>12/19 Clinda 12/19>>12/19 Vanc 12/18>>12/22 Zosyn 12/19>>12/22 Levofloxacin 12/22 >>  Levels/dose changes this admission: 12/20 VT @ 2150 >> 40 mcg/ml >> doses held 12/21 VR @ 1139 >> 33 mcg/ml >> doses held  Microbiology results: [facility] BCx 2/2 >> Group G Strep (S to amp, clinda, vanc, levofloxacin) 12/19 RCx >> moderate candida albicans 12/18 BCx >> 1/2 CoNS 12/18 MRSA PCR >> positive  Thank you for allowing pharmacy to be a part of this patient's care. Please contact us if you have any questions.  Sherron MondayAubrey N. Vandella Ord, PharmD Clinical Pharmacy Resident Pager: [phone] 10/11/2015 10:57 AM

## 2015-10-11 NOTE — Progress Notes (Signed)
RT called to room due to patient WOB. RT placed patient back on vent with settings of. Vt 450, RR 26, PEEP 5, and FIO2 40. Patient is comfortable and resting well at this time.

## 2015-10-11 NOTE — Progress Notes (Signed)
PULMONARY / CRITICAL CARE MEDICINE   Name: Adrian DiegoCarlton B Gaydos Jr. MRN: [id number] DOB: 09/15/1961    ADMISSION DATE:  10/04/2015  [facility]  CHIEF COMPLAINT: SOB  HISTORY OF PRESENT ILLNESS:   54 yo who [facility], he is 527 lbs and has OSA, also has drainage from left leg and panus consistent with cellulitis. He was treated with abx and BC revealed strep group h in 2/2. 12/18 he became hypoxic, ams and with general decline. [facility] did not have adequate nursing staff [facility]. Currently he is intubated but with bacteremia/sepsis and suspected aspiration pna he will mos tlikely need intubation. Note he bipolar and on sedatives which may be adding to his altered mental status. We will check 12 lead and CE for completeness along with pro calcitonin panel. He is on adequate abx.  SUBJECTIVE:  No events overnight, weaning.  VITAL SIGNS: BP 158/63 mmHg  Pulse 88  Temp(Src) 99.1 F (37.3 C) (Oral)  Resp 16  Ht 5\' 11"  (1.803 m)  Wt 213.191 kg (470 lb)  BMI 65.58 kg/m2  SpO2 98%  HEMODYNAMICS: CVP:  [6 mmHg-9 mmHg] 9 mmHg  VENTILATOR SETTINGS: Vent Mode:  [-] CPAP;PSV FiO2 (%):  [40 %] 40 % Set Rate:  [26 bmp] 26 bmp Vt Set:  [450 mL] 450 mL PEEP:  [5 cmH20] 5 cmH20 Pressure Support:  [10 cmH20] 10 cmH20 Plateau Pressure:  [12 cmH20] 12 cmH20  INTAKE / OUTPUT: I/O last 3 completed shifts: In: 9647.8 [I.V.:3634.7; WU/JW:1191.4G/GT:5863.2; IV Piggyback:150] Out: [id number] [Urine:10475]  PHYSICAL EXAMINATION: General:  Morbidly obese male on MV on pressure support, sedate. Neuro:  Awakens to voice and touch, follows commands, moves all extremities to commands. HEENT:  Trach in place, Oral mucosa moist  Cardiovascular:  RRR, S1/ S2  Lungs: Unlabored on the vent, diminished airflow bilaterally. Abdomen:  Obese panus dressing in place, panus wound is full thickness, 16x2x1.5 cm with yellow drainage, no odor. Musculoskeletal: No  joint deformities. Skin:  Leg dressing in place, L heel wound.  LABS:  BMET  Recent Labs Lab 10/10/15 2100 10/11/15 0014 10/11/15 0425  NA 156* 156* 155*  K 3.4* 3.2* 3.6  CL 112* 114* 114*  CO2 32 31 30  BUN 68* 67* 70*  CREATININE 1.67* 1.71* 1.71*  GLUCOSE 153* 161* 151*   Electrolytes  Recent Labs Lab 10/09/15 0448 10/10/15 0030 10/10/15 0429  10/10/15 2100 10/11/15 0014 10/11/15 0425  CALCIUM 8.2* 8.7* 8.7*  < > 9.1 8.9 8.8*  MG 2.8* 2.6* 2.6*  --   --   --  2.2  PHOS 4.7*  --  4.7*  --   --   --  4.5  < > = values in this interval not displayed. CBC  Recent Labs Lab 10/09/15 0448 10/10/15 0429 10/11/15 0425  WBC 12.1* 11.5* 10.1  HGB 7.5* 8.2* 8.5*  HCT 24.3* 27.2* 27.4*  PLT 337 375 349   Coag's  Recent Labs Lab 10/04/15 2350 10/09/15 0448  APTT 44* 32  INR 1.23 1.40   Sepsis Markers  Recent Labs Lab 10/04/15 2350 10/05/15 0520 10/06/15 0439  LATICACIDVEN 0.9  --   --   PROCALCITON 12.16 12.70 7.99   ABG  Recent Labs Lab 10/08/15 0256 10/09/15 0259 10/10/15 0350  PHART 7.346* 7.388 7.363  PCO2ART 50.1* 55.9* 52.8*  PO2ART 99.0 80.0 118*   Liver Enzymes  Recent Labs Lab 10/04/15 2350 10/06/15 0439  AST 106* 177*  ALT 95* 178*  ALKPHOS 79 86  BILITOT 0.5 0.3  ALBUMIN 2.5* 2.1*   Cardiac Enzymes  Recent Labs Lab 10/10/15 0030 10/10/15 0400 10/10/15 1300  TROPONINI 0.04* 0.04* 0.03   Glucose  Recent Labs Lab 10/10/15 1121 10/10/15 1604 10/10/15 1956 10/11/15 0011 10/11/15 0357 10/11/15 0737  GLUCAP 131* 118* 137* 132* 137* 118*   Imaging No results found. STUDIES:  12/23 TEE with no vegetations  CULTURES: 12/16 [facility]) >> Strep group g 2 of 2>>levaquin sensitive 12/16 [facility]) >> multiple species 12/18 urine >> negative 12/19 Resp >> moderate candida 12/18 Blood >> GPC clusters >> levaquin sensitive 12/21 Blood >>   ANTIBIOTICS: Ceftriaxone 12/17 >> 12/19 Vanco 12/18 >>12/22 Clinda 12/19  >> 12/19 Zosyn 12/19 >>12/22 Levaquin 12/22>>>  SIGNIFICANT EVENTS: 12/16 admit to [facility] with panus infection 12/18 Transfer to [facility] due to no ICU nurses at [facility].  LINES/TUBES: ETT 12/18 >>12/23 Trach (JY) 12/23>>> L IJ TLC 12/20>>>  DISCUSSION: Morbidly obese male with panus and leg infections, sepsis, and hypoxic respiratory failure due to ALI superimposed on OHS +/- sedating meds. Intubated 12/18; remains on full vent support with PEEP of 10 12/21.   ASSESSMENT / PLAN:  PULMONARY A: ARDS-mild improvement on 12/21 CXR Suspected aspiration event with probable HCAP Hx of asthma  P:   - Attempt TC today, hopefully if able to tolerate during the day today then deflate cuff and attempt swallow evaluation in AM. - Diureses as ordered below.  INFECTIOUS A:   Cellulitis of panus  Group G strep bacteremia HCAP / aspiration PNA P:   - Levaquin as ordered. - Appreciate WOC consult > dressing changes and other intervention per their recs - Echo with EF of 50%. - TEE negative for vegetations.  CARDIOVASCULAR A:  HTN-BP stable on no antihypertensives P:  - Continue to hold antihypertensives as normotensive on sedation.  - TEE negative for vegetation - Lasix as below.  RENAL A:   Acute renal failure-Worsening creatinine 12/21 2.95(1.98 12/20) P:   - Follow BMP and UOP - Avoid nephrotoxic medications. - Decrease lasix to 40 mg IV q8 hours x2 doses. - Potassium PO. - Free water 400 q6 hours. - D5W at 50 ml/hr.  GASTROINTESTINAL A:   GI protection Mild transaminitis, ? etiology P:   - PPI - Continue TF.  HEMATOLOGIC A:   Anemia of chronic disease; Hg stable between 7-8g/dl P:  - DVT protection with heparin. - Monitor Hg/HCT.  ENDOCRINE A:   Type 2 DM P:   - Blood glucose monitoring and SSI per protocol  NEUROLOGIC A:   Sedation for MV Bipolar disorder P:   RASS goal: -1 to -2 - D/C versed. - Fentanyl drip. - Precedex drip. - Bipolar medications  ordered > trileptal and geodon  FAMILY  - Updates: No family bedside.  - Inter-disciplinary family meet or Palliative Care meeting due by:  12/25  The patient is critically ill with multiple organ systems failure and requires high complexity decision making for assessment and support, frequent evaluation and titration of therapies, application of advanced monitoring technologies and extensive interpretation of multiple databases.   Critical Care Time devoted to patient care services described in this note is  35  Minutes. This time reflects time of care of this signee Dr Koren Bound. This critical care time does not reflect procedure time, or teaching time or supervisory time of PA/NP/Med student/Med Resident etc but could involve care discussion time.  Adrian [name], M.D. [facility] Pulmonary/Critical Care Medicine. Pager: [phone]. After hours pager: [phone].

## 2015-10-11 NOTE — Progress Notes (Signed)
eLink Physician-Brief Progress Note Patient Name: Adrian DiegoCarlton B Godfrey Jr. DOB: 06/12/1961 MRN: [id number]    Recent Labs Lab 10/04/15 2350 10/05/15 0520  10/07/15 0606 10/08/15 0356 10/09/15 0448 10/10/15 0030 10/10/15 0429 10/10/15 1259 10/10/15 1737 10/10/15 2100 10/11/15 0014  NA 137 139  < > 143 146* 153* 157* 156* 156* 156* 156* 156*  K 4.4 4.3  < > 4.7 3.9 4.1 4.0 4.0 4.0 3.8 3.4* 3.2*  CL 101 101  < > 104 107 112* 115* 114* 113* 113* 112* 114*  CO2 26 25  < > 25 26 31 31  32 32 32 32 31  GLUCOSE 156* 168*  < > 101* 145* 116* 135* 130* 143* 142* 153* 161*  BUN 30* 34*  < > 66* 67* 72* 71* 73* 71* 69* 68* 67*  CREATININE 1.86* 1.85*  < > 2.95* 2.30* 2.19* 1.97* 1.92* 1.83* 1.68* 1.67* 1.71*  CALCIUM 7.9* 7.6*  < > 7.9* 7.7* 8.2* 8.7* 8.7* 8.9 9.0 9.1 8.9  MG 2.7* 2.5*  --  3.0* 3.0* 2.8* 2.6* 2.6*  --   --   --   --   PHOS 6.8* 7.2*  --   --  5.4* 4.7*  --  4.7*  --   --   --   --   < > = values in this interval not displayed.  Plan Cut off all nrmal saline in infusions kcl 40meq via tue x 1   Intervention Category Intermediate Interventions: Electrolyte abnormality - evaluation and management  Adrian [name] 10/11/2015, 2:50 AM

## 2015-10-12 DIAGNOSIS — G9340 Encephalopathy, unspecified: Secondary | ICD-10-CM

## 2015-10-12 DIAGNOSIS — L03818 Cellulitis of other sites: Secondary | ICD-10-CM

## 2015-10-12 LAB — CULTURE, BLOOD (ROUTINE X 2)
CULTURE: NO GROWTH
CULTURE: NO GROWTH

## 2015-10-12 LAB — GLUCOSE, CAPILLARY
GLUCOSE-CAPILLARY: 126 mg/dL — AB (ref 65–99)
GLUCOSE-CAPILLARY: 131 mg/dL — AB (ref 65–99)
GLUCOSE-CAPILLARY: 146 mg/dL — AB (ref 65–99)
Glucose-Capillary: 134 mg/dL — ABNORMAL HIGH (ref 65–99)
Glucose-Capillary: 130 mg/dL — ABNORMAL HIGH (ref 65–99)
Glucose-Capillary: 115 mg/dL — ABNORMAL HIGH (ref 65–99)

## 2015-10-12 LAB — BASIC METABOLIC PANEL
ANION GAP: 11 (ref 5–15)
ANION GAP: 11 (ref 5–15)
BUN: 74 mg/dL — ABNORMAL HIGH (ref 6–20)
BUN: 75 mg/dL — ABNORMAL HIGH (ref 6–20)
CALCIUM: 8.8 mg/dL — AB (ref 8.9–10.3)
CALCIUM: 8.6 mg/dL — AB (ref 8.9–10.3)
CO2: 31 mmol/L (ref 22–32)
CO2: 31 mmol/L (ref 22–32)
Chloride: 113 mmol/L — ABNORMAL HIGH (ref 101–111)
Chloride: 113 mmol/L — ABNORMAL HIGH (ref 101–111)
Creatinine, Ser: 1.64 mg/dL — ABNORMAL HIGH (ref 0.61–1.24)
Creatinine, Ser: 1.69 mg/dL — ABNORMAL HIGH (ref 0.61–1.24)
GFR, EST AFRICAN AMERICAN: 53 mL/min — AB (ref >60)
GFR, EST AFRICAN AMERICAN: 51 mL/min — AB (ref >60)
GFR, EST NON AFRICAN AMERICAN: 46 mL/min — AB (ref >60)
GFR, EST NON AFRICAN AMERICAN: 44 mL/min — AB (ref >60)
Glucose, Bld: 149 mg/dL — ABNORMAL HIGH (ref 65–99)
Glucose, Bld: 156 mg/dL — ABNORMAL HIGH (ref 65–99)
POTASSIUM: 4.2 mmol/L (ref 3.5–5.1)
Potassium: 4.5 mmol/L (ref 3.5–5.1)
Sodium: 155 mmol/L — ABNORMAL HIGH (ref 135–145)
Sodium: 155 mmol/L — ABNORMAL HIGH (ref 135–145)

## 2015-10-12 LAB — CBC
HCT: 28.5 % — ABNORMAL LOW (ref 39.0–52.0)
HEMOGLOBIN: 8.4 g/dL — AB (ref 13.0–17.0)
MCH: 27.5 pg (ref 26.0–34.0)
MCHC: 29.5 g/dL — ABNORMAL LOW (ref 30.0–36.0)
MCV: 93.1 fL (ref 78.0–100.0)
Platelets: 370 10*3/uL (ref 150–400)
RBC: 3.06 MIL/uL — AB (ref 4.22–5.81)
RDW: 17.8 % — ABNORMAL HIGH (ref 11.5–15.5)
WBC: 10.7 10*3/uL — AB (ref 4.0–10.5)

## 2015-10-12 LAB — HEPATIC FUNCTION PANEL
ALK PHOS: 81 U/L (ref 38–126)
ALT: 81 U/L — ABNORMAL HIGH (ref 17–63)
AST: 46 U/L — ABNORMAL HIGH (ref 15–41)
Albumin: 2.1 g/dL — ABNORMAL LOW (ref 3.5–5.0)
BILIRUBIN TOTAL: 0.3 mg/dL (ref 0.3–1.2)
Total Protein: 6 g/dL — ABNORMAL LOW (ref 6.5–8.1)

## 2015-10-12 LAB — PHOSPHORUS: PHOSPHORUS: 5.1 mg/dL — AB (ref 2.5–4.6)

## 2015-10-12 LAB — MAGNESIUM: MAGNESIUM: 2.3 mg/dL (ref 1.7–2.4)

## 2015-10-12 MED ORDER — FUROSEMIDE 10 MG/ML IJ SOLN
40.0000 mg | Freq: Once | INTRAMUSCULAR | Status: AC
  Administered 2015-10-12: 40 mg via INTRAVENOUS
  Filled 2015-10-12: qty 4

## 2015-10-12 MED ORDER — SPIRONOLACTONE 25 MG PO TABS
25.0000 mg | ORAL_TABLET | Freq: Every day | ORAL | Status: DC
  Administered 2015-10-12 – 2015-10-14 (×3): 25 mg via ORAL
  Filled 2015-10-12 (×4): qty 1

## 2015-10-12 MED ORDER — ACETAMINOPHEN 160 MG/5ML PO SOLN
650.0000 mg | Freq: Four times a day (QID) | ORAL | Status: DC | PRN
  Administered 2015-10-13 – 2015-10-15 (×2): 650 mg
  Filled 2015-10-12 (×3): qty 20.3

## 2015-10-12 MED ORDER — POTASSIUM CHLORIDE CRYS ER 20 MEQ PO TBCR
40.0000 meq | EXTENDED_RELEASE_TABLET | Freq: Once | ORAL | Status: AC
  Administered 2015-10-12: 40 meq via ORAL
  Filled 2015-10-12: qty 2

## 2015-10-12 MED ORDER — ZIPRASIDONE HCL 80 MG PO CAPS
80.0000 mg | ORAL_CAPSULE | Freq: Two times a day (BID) | ORAL | Status: DC
  Administered 2015-10-13 – 2015-10-20 (×15): 80 mg via ORAL
  Filled 2015-10-12 (×14): qty 1
  Filled 2015-10-12: qty 4
  Filled 2015-10-12: qty 1

## 2015-10-12 MED ORDER — FENTANYL CITRATE (PF) 100 MCG/2ML IJ SOLN
25.0000 ug | INTRAMUSCULAR | Status: DC | PRN
  Administered 2015-10-13: 100 ug via INTRAVENOUS
  Filled 2015-10-12: qty 2

## 2015-10-12 NOTE — Care Management Important Message (Signed)
Important Message  Patient Details  Name: Adrian DiegoCarlton B Buske Jr. MRN: [id number] Date of Birth: 1961/09/28   Medicare Important Message Given:  Yes    Hanley HaysDowell, Caira Poche T, RN 10/12/2015, 12:12 PM

## 2015-10-12 NOTE — Progress Notes (Signed)
PULMONARY / CRITICAL CARE MEDICINE   Name: Adrian [name]. MRN: [id number] DOB: [birth year]    ADMISSION DATE:  10/04/2015  REFERRING MD:  [facility]  CHIEF COMPLAINT: SOB  HISTORY OF PRESENT ILLNESS:   54 yo who presented  to [facility] 12/16 with SOB, he is 527 lbs and has OSA, also has drainage from left leg and panus consistent with cellulitis.  SUBJECTIVE:  No acute events Was on vent overnight, back on trach collar today  VITAL SIGNS: BP 156/63 mmHg  Pulse 89  Temp(Src) 98.3 F (36.8 C) (Oral)  Resp 8  Ht  (1.803 m)  Wt 208.655 kg (460 lb)  BMI 64.19 kg/m2  SpO2 96%  HEMODYNAMICS: CVP:  [8 mmHg-11 mmHg] 8 mmHg  VENTILATOR SETTINGS: Vent Mode:  [-] PRVC FiO2 (%):  [28 %-40 %] 40 % Set Rate:  [26 bmp] 26 bmp Vt Set:  [450 mL] 450 mL PEEP:  [5 cmH20] 5 cmH20 Plateau Pressure:  [11 cmH20-18 cmH20] 11 cmH20  INTAKE / OUTPUT: I/O last 3 completed shifts: In: 8648 [I.V.:3718; WU/JW:1191; IV Piggyback:150] Out: 8985 [Urine:8985]  PHYSICAL EXAMINATION:  Gen: awake, wants orange juice PULM: few rhonchi, clear CV: RRR, distant, no clear murmur GI: soft, nontender Derm: massive panus, chronic appearing wound well dressed in panus, no drainage, fungal appearing erythema under panus in fold Neuro: awake and alert, answering questions and following commands  LABS:  BMET  Recent Labs Lab 10/11/15 2200 10/12/15 0106 10/12/15 0500  NA 154* 155* 155*  K 4.2 4.2 4.5  CL 114* 113* 113*  CO2 BUN 68* 74* 75*  CREATININE 1.61* 1.64* 1.69*  GLUCOSE 161* 149* 156*   Electrolytes  Recent Labs Lab 10/10/15 0429  10/11/15 0425  10/11/15 2200 10/12/15 0106 10/12/15 0500  CALCIUM 8.7*  < > 8.8*  < > 9.0 8.8* 8.6*  MG 2.6*  --  2.2  --   --   --  2.3  PHOS 4.7*  --  4.5  --   --   --  5.1*  < > = values in this interval not displayed. CBC  Recent Labs Lab 10/10/15 0429 10/11/15 0425 10/12/15 0500  WBC 11.5* 10.1 10.7*  HGB 8.2* 8.5* 8.4*  HCT  27.2* 27.4* 28.5*  PLT 375 349 370   Coag's  Recent Labs Lab 10/09/15 0448  APTT 32  INR 1.40   Sepsis Markers  Recent Labs Lab 10/06/15 0439  PROCALCITON 7.99   ABG  Recent Labs Lab 10/08/15 0256 10/09/15 0259 10/10/15 0350  PHART 7.346* 7.388 7.363  PCO2ART 50.1* 55.9* 52.8*  PO2ART 99.0 80.0 118*   Liver Enzymes  Recent Labs Lab 10/06/15 0439  AST 177*  ALT 178*  ALKPHOS 86  BILITOT 0.3  ALBUMIN 2.1*   Cardiac Enzymes  Recent Labs Lab 10/10/15 0030 10/10/15 0400 10/10/15 1300  TROPONINI 0.04* 0.04* 0.03   Glucose  Recent Labs Lab 10/11/15 1110 10/11/15 1618 10/11/15 1947 10/11/15 2344 10/12/15 0322 10/12/15 0749  GLUCAP 125* 113* 127* 131* 146* 134*   Imaging No results found. STUDIES:  12/23 TEE with no vegetations  CULTURES: 12/16 bc [facility]) >> Strep group g 2 of 2>>levaquin sensitive 12/16 [facility]) >> multiple species 12/18 urine >> negative 12/19 Resp >> moderate candida 12/18 Blood >> Coag negative staph  12/21 Blood >>   ANTIBIOTICS: Ceftriaxone 12/17 >> 12/19 Vanco 12/18 >>12/22 Clinda 12/19 >> 12/19 Zosyn 12/19 >>12/22 Levaquin 12/22>>>  SIGNIFICANT EVENTS: [facility] 12[facility]18 [facility] 12/23 Tracheostomy 12/25 off vent during daytime  LINES/TUBES: ETT 12/18 >>12/23 Trach (JY) 12/23>>> L IJ TLC 12/20>>>  DISCUSSION: Morbidly obese male with panus and leg infections, sepsis, and hypoxic respiratory failure due to ALI superimposed on OHS +/- sedating meds. Intubated 12/18; tracheostomy 12/23, now with improving oxygenation  ASSESSMENT / PLAN:  PULMONARY A:  ARDS-improving ?HCAP> improving Hx of asthma  Acute pulmonary edema with total body volume overload P:   speaking valve consult today Continue vent at night Continue diuresis  INFECTIOUS A:   Cellulitis of panus  Group G strep bacteremia, TEE negative for vegetations HCAP /  aspiration PNA P:   Levaquin as ordered > plan 14 days given severity of illness Appreciate WOC consult > dressing changes and other intervention per their recs  CARDIOVASCULAR A:  HTN P:  Continue amlodipine, metoprolol Lasix x1 dose today Add spironolactone Insert peripheral IV, try to get rid of CVL  RENAL A:   CKD? Unknown baseline Hypernatremia P:   Continue free water Lasix x1 dose today Monitor BMET and UOP Replace electrolytes as needed  GASTROINTESTINAL A:   GI protection Mild transaminitis, ? etiology P:   PPI SLP eval today Repeat LFT  HEMATOLOGIC A:   Anemia of chronic disease; Hg stable between 7-8g/dl P:  Monitor for bleeding  ENDOCRINE A:   Type 2 DM P:   Blood glucose monitoring and SSI per protocol  NEUROLOGIC A:   Bipolar disorder Marked agitation on precedex and fentanyl P:   Continue bipolar medications ordered > trileptal and geodon Discontinue fentanyl gtt Titrate off precedex Use APAP prn pain (pending LFT repeat) or fentanyl for severe pain  My cc time 35 minutes  FAMILY  - Updates: No family bedside 12/26   - Inter-disciplinary family meet or Palliative Care meeting due by:  12/25  Heber CarolinaBrent Chancellor Vanderloop, MD [place] [facility] Pager: [phone] Cell: [phone] After 3pm or if no response, call [phone]

## 2015-10-12 NOTE — Progress Notes (Signed)
Wasted fentanyl in sink. Approx. 125 ml, witnessed with Elberta Fortisaroline Schenck, RN.

## 2015-10-12 NOTE — Progress Notes (Signed)
eLink Physician-Brief Progress Note Patient Name: Adrian DiegoCarlton B Massar Jr. DOB: [birth year] MRN: [id number]   Date of Service  10/12/2015  HPI/Events of Note  Remains on precedex Mostly for shouting but not pulling at tubes lines  eICU Interventions  Wean off precedex Increase geodon     Intervention Category Intermediate Interventions: Change in mental status - evaluation and management  Max FickleDouglas McQuaid 10/12/2015, 7:32 PM

## 2015-10-13 DIAGNOSIS — J810 Acute pulmonary edema: Secondary | ICD-10-CM

## 2015-10-13 LAB — CBC WITH DIFFERENTIAL/PLATELET
BASOS ABS: 0 10*3/uL (ref 0.0–0.1)
BASOS PCT: 0 %
EOS ABS: 0.2 10*3/uL (ref 0.0–0.7)
EOS PCT: 1 %
HCT: 30.8 % — ABNORMAL LOW (ref 39.0–52.0)
Hemoglobin: 9 g/dL — ABNORMAL LOW (ref 13.0–17.0)
LYMPHS PCT: 13 %
Lymphs Abs: 2.1 10*3/uL (ref 0.7–4.0)
MCH: 26.9 pg (ref 26.0–34.0)
MCHC: 29.2 g/dL — ABNORMAL LOW (ref 30.0–36.0)
MCV: 92.2 fL (ref 78.0–100.0)
MONO ABS: 0.9 10*3/uL (ref 0.1–1.0)
Monocytes Relative: 6 %
Neutro Abs: 13.2 10*3/uL — ABNORMAL HIGH (ref 1.7–7.7)
Neutrophils Relative %: 80 %
PLATELETS: 456 10*3/uL — AB (ref 150–400)
RBC: 3.34 MIL/uL — AB (ref 4.22–5.81)
RDW: 17.9 % — AB (ref 11.5–15.5)
WBC: 16.4 10*3/uL — AB (ref 4.0–10.5)

## 2015-10-13 LAB — BASIC METABOLIC PANEL
Anion gap: 13 (ref 5–15)
BUN: 76 mg/dL — AB (ref 6–20)
CALCIUM: 8.7 mg/dL — AB (ref 8.9–10.3)
CO2: 29 mmol/L (ref 22–32)
CREATININE: 1.66 mg/dL — AB (ref 0.61–1.24)
Chloride: 113 mmol/L — ABNORMAL HIGH (ref 101–111)
GFR calc Af Amer: 52 mL/min — ABNORMAL LOW (ref >60)
GFR, EST NON AFRICAN AMERICAN: 45 mL/min — AB (ref >60)
GLUCOSE: 131 mg/dL — AB (ref 65–99)
POTASSIUM: 4.2 mmol/L (ref 3.5–5.1)
SODIUM: 155 mmol/L — AB (ref 135–145)

## 2015-10-13 LAB — GLUCOSE, CAPILLARY
GLUCOSE-CAPILLARY: 117 mg/dL — AB (ref 65–99)
Glucose-Capillary: 114 mg/dL — ABNORMAL HIGH (ref 65–99)
Glucose-Capillary: 122 mg/dL — ABNORMAL HIGH (ref 65–99)
Glucose-Capillary: 125 mg/dL — ABNORMAL HIGH (ref 65–99)
Glucose-Capillary: 134 mg/dL — ABNORMAL HIGH (ref 65–99)
Glucose-Capillary: 124 mg/dL — ABNORMAL HIGH (ref 65–99)

## 2015-10-13 MED ORDER — PANTOPRAZOLE SODIUM 40 MG PO PACK
40.0000 mg | PACK | Freq: Every day | ORAL | Status: DC
  Administered 2015-10-14 – 2015-10-19 (×6): 40 mg
  Filled 2015-10-13 (×7): qty 20

## 2015-10-13 MED ORDER — FREE WATER
400.0000 mL | Status: DC
  Administered 2015-10-13 – 2015-10-15 (×20): 400 mL

## 2015-10-13 NOTE — Progress Notes (Signed)
Spoke w dr Kendrick Friesmcquaid and he would like pt to go to [facility] if avail. Spoke w pt and [facility]. [facility] at [facility] back and does not want to go back there. Both [facility] felt still candidate. [facility] does not have bed today but may have in am. Explained to pt that [facility] not avail today but poss in am and [facility] may have bed also. Explained if he does too well he may not be able to go to [facility]. He is agreeing and hoping for [facility] bed in am. Will cont to follow.

## 2015-10-13 NOTE — Progress Notes (Signed)
eLink Physician-Brief Progress Note Patient Name: Adrian DiegoCarlton B Lindholm Jr. DOB: 09-01-1961 MRN: [id number]   Date of Service  10/13/2015  HPI/Events of Note  Patient with multiple loose stools on TFs.  No evidence of C Diff.  eICU Interventions  Plan: Rectal tube as needed     Intervention Category Intermediate Interventions: Other:  Karsen Nakanishi 10/13/2015, 1:26 AM

## 2015-10-13 NOTE — Progress Notes (Signed)
PULMONARY / CRITICAL CARE MEDICINE   Name: Adrian [name]. MRN: [id number] DOB: [birth year]    ADMISSION DATE:  10/04/2015  REFERRING MD:  [facility]  CHIEF COMPLAINT: SOB  HISTORY OF PRESENT ILLNESS:   54 yo who presented  to [facility] 12/16 with SOB, he is 527 lbs and has OSA, also has drainage from left leg and panus consistent with cellulitis.  SUBJECTIVE:  Off drips Good spirits Wants to eat Off vent > 24 hours  VITAL SIGNS: BP 151/77 mmHg  Pulse 115  Temp(Src) 100 F (37.8 C) (Oral)  Resp 20  Ht  (1.803 m)  Wt 200 kg (440 lb 14.7 oz)  BMI 61.52 kg/m2  SpO2 97%  HEMODYNAMICS: CVP:  [6 mmHg-9 mmHg] 6 mmHg  VENTILATOR SETTINGS: Vent Mode:  [-]  FiO2 (%):  [28 %] 28 %  INTAKE / OUTPUT: I/O last 3 completed shifts: In: 8062.5 [I.V.:3152.5; NG/GT:4760; IV Piggyback:150] Out: 9925 [Urine:9925]  PHYSICAL EXAMINATION:  Gen: awake, no distress PULM: CTA B CV: RRR, distant, no clear murmur GI: soft, nontender, BS+ Derm: massive panus, chronic appearing wound well dressed in panus, no drainage, fungal appearing erythema under panus in fold Neuro: awake and alert, answering questions and following commands  LABS:  BMET  Recent Labs Lab 10/12/15 0106 10/12/15 0500 10/13/15 0230  NA 155* 155* 155*  K 4.2 4.5 4.2  CL 113* 113* 113*  CO2 BUN 74* 75* 76*  CREATININE 1.64* 1.69* 1.66*  GLUCOSE 149* 156* 131*   Electrolytes  Recent Labs Lab 10/10/15 0429  10/11/15 0425  10/12/15 0106 10/12/15 0500 10/13/15 0230  CALCIUM 8.7*  < > 8.8*  < > 8.8* 8.6* 8.7*  MG 2.6*  --  2.2  --   --  2.3  --   PHOS 4.7*  --  4.5  --   --  5.1*  --   < > = values in this interval not displayed. CBC  Recent Labs Lab 10/11/15 0425 10/12/15 0500 10/13/15 0230  WBC 10.1 10.7* 16.4*  HGB 8.5* 8.4* 9.0*  HCT 27.4* 28.5* 30.8*  PLT 349 370 456*   Coag's  Recent Labs Lab 10/09/15 0448  APTT 32  INR 1.40   Sepsis Markers No results for input(s):  LATICACIDVEN, PROCALCITON, O2SATVEN in the last 168 hours. ABG  Recent Labs Lab 10/08/15 0256 10/09/15 0259 10/10/15 0350  PHART 7.346* 7.388 7.363  PCO2ART 50.1* 55.9* 52.8*  PO2ART 99.0 80.0 118*   Liver Enzymes  Recent Labs Lab 10/12/15 0500  AST 46*  ALT 81*  ALKPHOS 81  BILITOT 0.3  ALBUMIN 2.1*   Cardiac Enzymes  Recent Labs Lab 10/10/15 0030 10/10/15 0400 10/10/15 1300  TROPONINI 0.04* 0.04* 0.03   Glucose  Recent Labs Lab 10/12/15 0749 10/12/15 1115 10/12/15 1547 10/12/15 2036 10/13/15 0014 10/13/15 0358  GLUCAP 134* 126* 130* 115* 114* 122*   Imaging No results found. STUDIES:  12/23 TEE with no vegetations  CULTURES: 12/16 bc [facility]) >> Strep group g 2 of 2>>levaquin sensitive 12/16 [facility]) >> multiple species 12/18 urine >> negative 12/19 Resp >> moderate candida 12/18 Blood >> Coag negative staph  12/21 Blood >>   ANTIBIOTICS: Ceftriaxone 12/17 >> 12/19 Vanco 12/18 >>12/22 Clinda 12/19 >> 12/19 Zosyn 12/19 >>12/22 Levaquin 12/22>>> NGTD  SIGNIFICANT EVENTS: 12/16 admit to [facility] with panus infection 12/18 Transfer to [facility] due to no ICU nurses [facility]. 12/23 Tracheostomy 12/25 off vent during daytime  LINES/TUBES: ETT  12/18 >>12/23 Trach Ninetta Lights(JY) 12/23>>> L IJ TLC 12/20>>> 12/26  DISCUSSION: Morbidly obese male with panus and leg infections, sepsis, and hypoxic respiratory failure due to ALI superimposed on OHS +/- sedating meds. Intubated 12/18; tracheostomy 12/23, now with improving oxygenation, remains off vent.  ASSESSMENT / PLAN:  PULMONARY A:  ARDS-continuing to improve ?HCAP> resolved Hx of asthma  Acute pulmonary edema with total body volume overload Obstructive sleep apnea> resolved with trach Obesity hypoventilation syndrome > currently doing well with out vent support P:   Speaking valve consult Will likely downsize trach at 7 day trach change to cuffless, but do not plan to de-cannulate anytime soon Wean O2  for O2 saturation > 92%  INFECTIOUS A:   Cellulitis of panus with chronic wound Group G strep bacteremia, TEE negative for vegetations HCAP / aspiration PNA P:   Levaquin as ordered > plan 14 days given severity of illness Appreciate WOC consult > dressing changes and other intervention per their recs  CARDIOVASCULAR A:  HTN P:  Continue amlodipine, metoprolol Continue spironolactone  RENAL A:   CKD? Unknown baseline Hypernatremia > continues P:   Increase free water via tube Hold lasix today Stop D5 Lasix x1 dose today Monitor BMET and UOP Replace electrolytes as needed  GASTROINTESTINAL A:   GI protection Mild transaminitis, ? Etiology> resolved P:   PPI SLP eval today  HEMATOLOGIC A:   Anemia of chronic disease; Hg stable between 7-8g/dl P:  Monitor for bleeding  ENDOCRINE A:   Type 2 DM P:   Blood glucose monitoring and SSI per protocol  NEUROLOGIC A:   Bipolar disorder P:   Continue bipolar medications ordered > trileptal and geodon Discontinue fentanyl gtt Discontinue precedex Use APAP prn pain (pending LFT repeat) or fentanyl for severe pain  [facility] PCCM to follow for trach  FAMILY  - Updates: No family bedside 12/26   - Inter-disciplinary family meet or Palliative Care meeting due by:  12/25  Heber CarolinaBrent McQuaid, MD [place] PCCM Pager: [phone] Cell: [phone] After 3pm or if no response, call [phone]

## 2015-10-13 NOTE — Evaluation (Addendum)
Passy-Muir Speaking Valve - Evaluation Patient Details  Name: Adrian DiegoCarlton B Maniscalco Jr. MRN: [id number] Date of Birth: [birth year]  Today's Date: 10/13/2015 Time: 1327-1300 SLP Time Calculation (min) (ACUTE ONLY): 1413 min  Past Medical History:  Past Medical History  Diagnosis Date  . Asthma   . Hypertension   . Hyperlipemia   . Diabetes mellitus without complication (HCC)   . Hypothyroidism   . Bipolar disorder (HCC)   . Sleep apnea, obstructive   . Depression    Past Surgical History:  Past Surgical History  Procedure Laterality Date  . Tonsillectomy    . Bone removed      bone removed in foot due to infection    HPI:  54 yo PMH: morbid obesity (527 lbs), HTN, hyperlipidemia, DM, bipolar, OSA, drainage from left leg consistent cellulitis [facility] where he presented with SOB. Found to have strep,with general decline and [facility] Intubated 12/29, trach 12/23 vent, started trach collar 12/26.   Assessment / Plan / Recommendation Clinical Impression  Back pressure/air trapping present at onset of evaluation with Passy-Muir speaking valve. Pt coughing and blowing valve off after 3-5 seconds. Repeated trials with vavle resulted in valve off and on over 18 minute span with vitals SpO2 90%-95%, HR 104 and RR 17-27. Vocal intensity adequate with hoarse quality and decreased respiratory support speaking one word per breath during automatic utterances (counting, alphabet). Verbal cues provided for increase inhalation prior to verbalization. No continued evidence of air trapping. Recommend use of valve with ST only with follow up session tomorrow as well as swallow assessment.     SLP Assessment  Patient needs continued Speech Lanaguage Pathology Services    Follow Up Recommendations   (TBD)    Frequency and Duration min 2x/week  2 weeks    PMSV Trial PMSV was placed for: 18 min Able to redirect subglottic air through upper airway: Yes Able to Attain  Phonation: Yes Voice Quality: Hoarse Able to Expectorate Secretions: Yes Level of Secretion Expectoration with PMSV: Tracheal Breath Support for Phonation: Severely decreased Intelligibility: Intelligibility reduced Word: 75-100% accurate Phrase: 75-100% accurate Sentence: 75-100% accurate Conversation: 75-100% accurate Respirations During Trial:  (17-27) SpO2 During Trial:  (90-95) Pulse During Trial:  (104) Behavior: Alert;Confused;Controlled;Cooperative;Responsive to questions   Tracheostomy Tube       Vent Dependency  FiO2 (%): 28 %    Cuff Deflation Trial Tolerated Cuff Deflation: Yes Length of Time for Cuff Deflation Trial:  (20 min) Behavior: Alert;Confused;Controlled;Cooperative;Responsive to questions   Adrian [name], Adrian [name] 10/13/2015, 2:49 PM   Adrian CoonsLisa [name] Lonell FaceLitaker M.Ed [employer]-SLP Pager [phone]

## 2015-10-13 NOTE — Progress Notes (Signed)
Not placed on ventilator at this time, all vital signs within normal range. Pt tolerating trach collar well at this time. RT will continue to monitor.

## 2015-10-14 ENCOUNTER — Inpatient Hospital Stay (HOSPITAL_COMMUNITY): Payer: Medicare Other

## 2015-10-14 DIAGNOSIS — A047 Enterocolitis due to Clostridium difficile: Secondary | ICD-10-CM

## 2015-10-14 DIAGNOSIS — Z430 Encounter for attention to tracheostomy: Secondary | ICD-10-CM | POA: Diagnosis present

## 2015-10-14 DIAGNOSIS — A0472 Enterocolitis due to Clostridium difficile, not specified as recurrent: Secondary | ICD-10-CM | POA: Diagnosis present

## 2015-10-14 LAB — CBC
HCT: 31.5 % — ABNORMAL LOW (ref 39.0–52.0)
Hemoglobin: 9.2 g/dL — ABNORMAL LOW (ref 13.0–17.0)
MCH: 27.2 pg (ref 26.0–34.0)
MCHC: 29.2 g/dL — AB (ref 30.0–36.0)
MCV: 93.2 fL (ref 78.0–100.0)
Platelets: 355 10*3/uL (ref 150–400)
RBC: 3.38 MIL/uL — ABNORMAL LOW (ref 4.22–5.81)
RDW: 18.1 % — AB (ref 11.5–15.5)
WBC: 10.5 10*3/uL (ref 4.0–10.5)

## 2015-10-14 LAB — GLUCOSE, CAPILLARY
GLUCOSE-CAPILLARY: 114 mg/dL — AB (ref 65–99)
GLUCOSE-CAPILLARY: 121 mg/dL — AB (ref 65–99)
GLUCOSE-CAPILLARY: 126 mg/dL — AB (ref 65–99)
GLUCOSE-CAPILLARY: 129 mg/dL — AB (ref 65–99)
Glucose-Capillary: 127 mg/dL — ABNORMAL HIGH (ref 65–99)
Glucose-Capillary: 129 mg/dL — ABNORMAL HIGH (ref 65–99)

## 2015-10-14 LAB — BASIC METABOLIC PANEL
Anion gap: 15 (ref 5–15)
BUN: 65 mg/dL — AB (ref 6–20)
CHLORIDE: 112 mmol/L — AB (ref 101–111)
CO2: 29 mmol/L (ref 22–32)
Calcium: 8.8 mg/dL — ABNORMAL LOW (ref 8.9–10.3)
Creatinine, Ser: 1.72 mg/dL — ABNORMAL HIGH (ref 0.61–1.24)
GFR calc non Af Amer: 43 mL/min — ABNORMAL LOW (ref >60)
GFR, EST AFRICAN AMERICAN: 50 mL/min — AB (ref >60)
Glucose, Bld: 138 mg/dL — ABNORMAL HIGH (ref 65–99)
POTASSIUM: 4.5 mmol/L (ref 3.5–5.1)
SODIUM: 156 mmol/L — AB (ref 135–145)

## 2015-10-14 MED ORDER — AMLODIPINE BESYLATE 10 MG PO TABS
10.0000 mg | ORAL_TABLET | Freq: Every day | ORAL | Status: DC
  Administered 2015-10-15 – 2015-10-19 (×5): 10 mg
  Filled 2015-10-14 (×5): qty 1

## 2015-10-14 MED ORDER — METOPROLOL TARTRATE 25 MG/10 ML ORAL SUSPENSION
50.0000 mg | Freq: Two times a day (BID) | ORAL | Status: DC
  Administered 2015-10-14 – 2015-10-15 (×2): 50 mg
  Filled 2015-10-14 (×3): qty 20

## 2015-10-14 MED ORDER — LEVOTHYROXINE SODIUM 75 MCG PO TABS
75.0000 ug | ORAL_TABLET | Freq: Every day | ORAL | Status: DC
  Administered 2015-10-15: 75 ug
  Filled 2015-10-14: qty 1

## 2015-10-14 MED ORDER — VANCOMYCIN HCL 10 G IV SOLR
2500.0000 mg | Freq: Once | INTRAVENOUS | Status: AC
  Administered 2015-10-15: 2500 mg via INTRAVENOUS
  Filled 2015-10-14: qty 2500

## 2015-10-14 MED ORDER — VANCOMYCIN HCL 10 G IV SOLR
1750.0000 mg | INTRAVENOUS | Status: DC
  Administered 2015-10-16: 1750 mg via INTRAVENOUS
  Filled 2015-10-14: qty 1750

## 2015-10-14 NOTE — Progress Notes (Signed)
ANTIBIOTIC CONSULT NOTE - INITIAL  Pharmacy Consult for Vancomycin Indication: GPC in 1/2 blood cultures  No Known Allergies  Patient Measurements: Height: 5\' 11"  (180.3 cm) Weight: (!) 440 lb 14.7 oz (200 kg) IBW/kg (Calculated) : 75.3  Vital Signs: Temp: 98.6 F (37 C) (12/28 2000) Temp Source: Core (Comment) (12/28 2000) BP: 159/92 mmHg (12/28 2200) Pulse Rate: 95 (12/28 2200) Intake/Output from previous day: 12/27 0701 - 12/28 0700 In: 1690 [I.V.:150; NG/GT:1440; IV Piggyback:100] Out: 3700 [Urine:3700] Intake/Output from this shift: Total I/O In: -  Out: 650 [Urine:650]  Labs:  Recent Labs  10/12/15 0500 10/13/15 0230 10/14/15 0144 10/14/15 1305  WBC 10.7* 16.4*  --  10.5  HGB 8.4* 9.0*  --  9.2*  PLT 370 456*  --  355  CREATININE 1.69* 1.66* 1.72*  --    Estimated Creatinine Clearance: 86.9 mL/min (by C-G formula based on Cr of 1.72). No results for input(s): VANCOTROUGH, VANCOPEAK, VANCORANDOM, GENTTROUGH, GENTPEAK, GENTRANDOM, TOBRATROUGH, TOBRAPEAK, TOBRARND, AMIKACINPEAK, AMIKACINTROU, AMIKACIN in the last 72 hours.   Microbiology: Recent Results (from the past 720 hour(s))  Urine culture     Status: None   Collection Time: 10/02/15 11:36 AM  Result Value Ref Range Status   Specimen Description URINE, RANDOM  Final   Special Requests NONE  Final   Culture MULTIPLE SPECIES PRESENT, SUGGEST RECOLLECTION  Final   Report Status 10/03/2015 FINAL  Final  Blood culture (routine x 2)     Status: None   Collection Time: 10/02/15 11:36 AM  Result Value Ref Range Status   Specimen Description BLOOD RIGHT CHEST  Final   Special Requests BOTTLES DRAWN AEROBIC AND ANAEROBIC  1CC  Final   Culture  Setup Time   Final    GRAM POSITIVE COCCI IN BOTH AEROBIC AND ANAEROBIC BOTTLES CRITICAL RESULT CALLED TO, READ BACK BY AND VERIFIED WITH: MATT MCBANE AT 0315 ON 10/03/15 RWW CONFIRMED BY PMH    Culture   Final    STREPTOCOCCUS GROUP G IN BOTH AEROBIC AND  ANAEROBIC BOTTLES    Report Status 10/05/2015 FINAL  Final   Organism ID, Bacteria STREPTOCOCCUS GROUP G  Final      Susceptibility   Streptococcus group g - MIC*    ERYTHROMYCIN <=0.12 SENSITIVE Sensitive     VANCOMYCIN 0.5 SENSITIVE Sensitive     TRIMETH/SULFA >=320 RESISTANT Resistant     CLINDAMYCIN <=0.25 SENSITIVE Sensitive     LINEZOLID Value in next row Sensitive      SENSITIVE<=2    LEVOFLOXACIN Value in next row Sensitive      SENSITIVE0.5    AMPICILLIN Value in next row Sensitive      SENSITIVE<=0.25    * STREPTOCOCCUS GROUP G  Blood Culture ID Panel (Reflexed)     Status: Abnormal   Collection Time: 10/02/15 11:36 AM  Result Value Ref Range Status   Enterococcus species NOT DETECTED NOT DETECTED Final   Listeria monocytogenes NOT DETECTED NOT DETECTED Final   Staphylococcus species NOT DETECTED NOT DETECTED Final   Staphylococcus aureus NOT DETECTED NOT DETECTED Final   Streptococcus species DETECTED (A) NOT DETECTED Final    Comment: CRITICAL RESULT CALLED AND READ BACK BY MATT MCBANE AT 0315 ON 10/03/15 RWW    Streptococcus agalactiae NOT DETECTED NOT DETECTED Final   Streptococcus pneumoniae NOT DETECTED NOT DETECTED Final   Streptococcus pyogenes NOT DETECTED NOT DETECTED Final   Acinetobacter baumannii NOT DETECTED NOT DETECTED Final   Enterobacteriaceae species NOT DETECTED NOT DETECTED  Final   Enterobacter cloacae complex NOT DETECTED NOT DETECTED Final   Escherichia coli NOT DETECTED NOT DETECTED Final   Klebsiella oxytoca NOT DETECTED NOT DETECTED Final   Klebsiella pneumoniae NOT DETECTED NOT DETECTED Final   Proteus species NOT DETECTED NOT DETECTED Final   Serratia marcescens NOT DETECTED NOT DETECTED Final   Haemophilus influenzae NOT DETECTED NOT DETECTED Final   Neisseria meningitidis NOT DETECTED NOT DETECTED Final   Pseudomonas aeruginosa NOT DETECTED NOT DETECTED Final   Candida albicans NOT DETECTED NOT DETECTED Final   Candida glabrata NOT  DETECTED NOT DETECTED Final   Candida krusei NOT DETECTED NOT DETECTED Final   Candida parapsilosis NOT DETECTED NOT DETECTED Final   Candida tropicalis NOT DETECTED NOT DETECTED Final   Carbapenem resistance NOT DETECTED NOT DETECTED Final   Methicillin resistance NOT DETECTED NOT DETECTED Final   Vancomycin resistance NOT DETECTED NOT DETECTED Final  Blood culture (routine x 2)     Status: None   Collection Time: 10/02/15 11:40 AM  Result Value Ref Range Status   Specimen Description BLOOD RIGHT AC  Final   Special Requests BOTTLES DRAWN AEROBIC AND ANAEROBIC  1CC  Final   Culture  Setup Time   Final    GRAM POSITIVE COCCI AEROBIC BOTTLE ONLY CRITICAL RESULT CALLED TO, READ BACK BY AND VERIFIED WITH: MATT MCBANE AT 0315 ON 10/03/15 RWW CONFIRMED BY PM[facility]    Culture STREPTOCOCCUS GROUP G AEROBIC BOTTLE ONLY   Final   Report Status 10/07/2015 FINAL  Final   Organism ID, Bacteria STREPTOCOCCUS GROUP G  Final      Susceptibility   Streptococcus group g - MIC*    ERYTHROMYCIN <=0.12 SENSITIVE Sensitive     VANCOMYCIN 0.5 SENSITIVE Sensitive     TRIMETH/SULFA 160 RESISTANT Resistant     CLINDAMYCIN <=0.25 SENSITIVE Sensitive     LINEZOLID Value in next row Sensitive      SENSITIVE<=2    LEVOFLOXACIN Value in next row Sensitive      SENSITIVE0.5    AMPICILLIN Value in next row Sensitive      SENSITIVE<=0.25    * STREPTOCOCCUS GROUP G  MRSA PCR Screening     Status: Abnormal   Collection Time: 10/04/15 10:24 PM  Result Value Ref Range Status   MRSA by PCR POSITIVE (A) NEGATIVE Final    Comment:        The GeneXpert MRSA Assay (FDA approved for NASAL specimens only), is one component of a comprehensive MRSA colonization surveillance program. It is not intended to diagnose MRSA infection nor to guide or monitor treatment for MRSA infections. RESULT CALLED TO, READ BACK BY AND VERIFIED WITH: STOWE,S RN [id number] 10/05/15 MITCHELL,L   Urine culture     Status: None   Collection  Time: 10/04/15 11:39 PM  Result Value Ref Range Status   Specimen Description URINE, CATHETERIZED  Final   Special Requests NONE  Final   Culture NO GROWTH 1 DAY  Final   Report Status 10/06/2015 FINAL  Final  Culture, blood (routine x 2)     Status: None   Collection Time: 10/04/15 11:55 PM  Result Value Ref Range Status   Specimen Description BLOOD LEFT ANTECUBITAL  Final   Special Requests IN PEDIATRIC BOTTLE 4CC  Final   Culture  Setup Time   Final    GRAM POSITIVE COCCI IN CLUSTERS AEROBIC BOTTLE ONLY CRITICAL RESULT CALLED TO, READ BACK BY AND VERIFIED WITH: Coralie Carpen RN 1610 10/05/15 A BROWNING  Culture   Final    STAPHYLOCOCCUS SPECIES (COAGULASE NEGATIVE) THE SIGNIFICANCE OF ISOLATING THIS ORGANISM FROM A SINGLE SET OF BLOOD CULTURES WHEN MULTIPLE SETS ARE DRAWN IS UNCERTAIN. PLEASE NOTIFY THE MICROBIOLOGY DEPARTMENT WITHIN ONE WEEK IF SPECIATION AND SENSITIVITIES ARE REQUIRED.    Report Status 10/07/2015 FINAL  Final  Culture, blood (routine x 2)     Status: None   Collection Time: 10/05/15 12:28 AM  Result Value Ref Range Status   Specimen Description BLOOD RIGHT HAND  Final   Special Requests BOTTLES DRAWN AEROBIC ONLY 6CC  Final   Culture NO GROWTH 5 DAYS  Final   Report Status 10/10/2015 FINAL  Final  Culture, respiratory (NON-Expectorated)     Status: None   Collection Time: 10/05/15  1:25 AM  Result Value Ref Range Status   Specimen Description TRACHEAL ASPIRATE  Final   Special Requests NONE  Final   Gram Stain   Final    FEW WBC PRESENT,BOTH PMN AND MONONUCLEAR FEW SQUAMOUS EPITHELIAL CELLS PRESENT FEW YEAST WITH PSEUDOHYPHAE RARE GRAM POSITIVE COCCI IN PAIRS    Culture   Final    MODERATE CANDIDA ALBICANS Performed at [facility]    Report Status 10/07/2015 FINAL  Final  Culture, blood (Routine X 2) w Reflex to ID Panel     Status: None   Collection Time: 10/07/15 12:40 PM  Result Value Ref Range Status   Specimen Description BLOOD LEFT HAND   Final   Special Requests BOTTLES DRAWN AEROBIC AND ANAEROBIC 5CC  Final   Culture NO GROWTH 5 DAYS  Final   Report Status 10/12/2015 FINAL  Final  Culture, blood (Routine X 2) w Reflex to ID Panel     Status: None   Collection Time: 10/07/15 12:46 PM  Result Value Ref Range Status   Specimen Description BLOOD LEFT ANTECUBITAL  Final   Special Requests BOTTLES DRAWN AEROBIC AND ANAEROBIC 5CC  Final   Culture NO GROWTH 5 DAYS  Final   Report Status 10/12/2015 FINAL  Final  Culture, blood (routine x 2)     Status: None (Preliminary result)   Collection Time: 10/14/15  1:44 AM  Result Value Ref Range Status   Specimen Description BLOOD LEFT HAND  Final   Special Requests BOTTLES DRAWN AEROBIC AND ANAEROBIC 5ML  Final   Culture  Setup Time   Final    GRAM POSITIVE COCCI IN CLUSTERS ANAEROBIC BOTTLE ONLY CRITICAL RESULT CALLED TO, READ BACK BY AND VERIFIED WITH: B REAP RN [id number] 10/14/15 A BROWNING    Culture PENDING  Incomplete   Report Status PENDING  Incomplete    Medical History: Past Medical History  Diagnosis Date  . Asthma   . Hypertension   . Hyperlipemia   . Diabetes mellitus without complication (HCC)   . Hypothyroidism   . Bipolar disorder (HCC)   . Sleep apnea, obstructive   . Depression     Medications:  Prescriptions prior to admission  Medication Sig Dispense Refill Last Dose  . cefTRIAXone 2 g in dextrose 5 % 50 mL Inject 2 g into the vein daily.     Marland Kitchen. enoxaparin (LOVENOX) 40 MG/0.4ML injection Inject 0.4 mLs (40 mg total) into the skin every 12 (twelve) hours. 0 Syringe    . Fluticasone-Salmeterol (ADVAIR) 250-50 MCG/DOSE AEPB Inhale 1 puff into the lungs 2 (two) times daily.   10/01/2015 at Unknown time  . gabapentin (NEURONTIN) 300 MG capsule Take 300 mg by mouth 4 (four) times  daily.   10/01/2015 at Unknown time  . levothyroxine (SYNTHROID, LEVOTHROID) 75 MCG tablet Take 75 mcg by mouth daily before breakfast.   10/01/2015 at Unknown time  . lisinopril  (PRINIVIL,ZESTRIL) 5 MG tablet Take 5 mg by mouth daily.  5   . loratadine (CLARITIN) 10 MG tablet Take 10 mg by mouth daily.   10/01/2015 at Unknown time  . metFORMIN (GLUCOPHAGE) 1000 MG tablet Take 1,000 mg by mouth 2 (two) times daily with a meal. W/ morning and evening meals  2   . montelukast (SINGULAIR) 10 MG tablet Take 10 mg by mouth daily.   10/01/2015 at Unknown time  . nystatin (MYCOSTATIN/NYSTOP) 100000 UNIT/GM POWD Apply topically 3 (three) times daily.   10/01/2015 at Unknown time  . omeprazole (PRILOSEC) 20 MG capsule Take 20 mg by mouth daily.   10/01/2015 at Unknown time  . oxcarbazepine (TRILEPTAL) 600 MG tablet Take 600 mg by mouth 2 (two) times daily.   10/01/2015 at Unknown time  . potassium chloride SA (K-DUR,KLOR-CON) 20 MEQ tablet Take 20 mEq by mouth daily.   10/01/2015 at Unknown time  . tiotropium (SPIRIVA) 18 MCG inhalation capsule Place 18 mcg into inhaler and inhale daily.   10/01/2015 at Unknown time  . torsemide (DEMADEX) 20 MG tablet Take 20 mg by mouth daily.   10/01/2015 at Unknown time  . vancomycin 2,000 mg in sodium chloride 0.9 % 500 mL Inject 2,000 mg into the vein every 18 (eighteen) hours.     . verapamil (CALAN-SR) 120 MG CR tablet Take 120 mg by mouth at bedtime.   10/01/2015 at Unknown time  . ziprasidone (GEODON) 80 MG capsule Take 80 mg by mouth 2 (two) times daily with a meal.  3   . zolpidem (AMBIEN) 10 MG tablet Take one tablet by mouth every night at bedtime as needed for rest 10 tablet 0 prn at prn   Assessment: 54 y.o. M continues on Day #12 abx for Group B bacteremia/sepsis, suspected asp PNA, cellulitis. Now noted to have GPC in 1/2 new blood cultures. SCr 1.72, est norm CrCl 50 ml/min  LVQ 12/22 >>  Rocephin 12/17 >>12/19  Zosyn 12/19 >>12/22  Vanc 12/18 >>12/22  12/20 VT at 2150 = 40 mcg/ml >> held  12/21 VR at 1139 = 33 mcg/ml >> held  Clinda 12/19 >>12/19   12/16 [facility] >> Group G strep (S to Vanc, Amp, Clinda, Levaquin)   12/18 BCx x2 >> 1/2 CoNS  12/19 BCx x2 >> NGx2D  12/18 UCx >> NG  12/18 MRSA PCR positive  12/19 TA - moderate C.albicans  12/21 BCx x2 >> NG 12/28 BCx x2 - 1/2 GPC 12/28 UCx >> 12/28 Cdiff>>  Goal of Therapy:  Vancomycin trough level 15-20 mcg/ml  Plan:  Vancomycin  IV now then  IV q24h Will f/u micro data, renal function, and pt's clinical condition Vanc trough at Css in obese pt  Christoper Fabian, PharmD, BCPS Clinical pharmacist, pager [phone] 10/14/2015,11:38 PM

## 2015-10-14 NOTE — Progress Notes (Signed)
Patient's elevated temp was not responding to tylenol, ice packs, or tepid bath. Rectal temp is 103.1. Cooling blanket is applied. Will continue to monitor.

## 2015-10-14 NOTE — Progress Notes (Addendum)
Pharmacy Antibiotic Follow-up Note  Adrian DiegoCarlton B Mccalla Jr. is a 54 y.o. year-old male admitted on 10/04/2015.  The patient is currently on day 7 of Levaquin for group G bacteremia/sepsis, suspected asp PNA and cellulitis.  Total day of antibiotics is 12.  Patient's renal function is fluctuating, currently on rise again.  He continues to have a fever and leukocytosis.   Assessment/Plan: - Continue LVQ 750mg  IV Q24H - Monitor renal fxn, clinical course, QTc in setting of concomitant Geodon, LOT - Consider resuming home med, especially Synthroid   Temp (24hrs), Avg:101.5 F (38.6 C), Min:99.7 F (37.6 C), Max:103.1 F (39.5 C)   Recent Labs Lab 10/09/15 0448 10/10/15 0429 10/11/15 0425 10/12/15 0500 10/13/15 0230  WBC 12.1* 11.5* 10.1 10.7* 16.4*    Recent Labs Lab 10/11/15 2200 10/12/15 0106 10/12/15 0500 10/13/15 0230 10/14/15 0144  CREATININE 1.61* 1.64* 1.69* 1.66* 1.72*   Estimated Creatinine Clearance: 86.9 mL/min (by C-G formula based on Cr of 1.72).    No Known Allergies   Antimicrobials this admission: LVQ 12/22 >> Rocephin 12/17 >>12/19 Zosyn 12/19 >>12/22 Vanc 12/18 >>12/22 Clinda 12/19 >>12/19  Levels/dose changes this admission: 12/20 VT at 2150 = 40 mcg/ml >> held 12/21 VR at 1139 = 33 mcg/ml >> held  Microbiology results: 12/[facility] >> Group G strep (S to Vanc, Amp, Clinda, Levaquin) 12/18 BCx x2 >> 1/2 CoNS 12/19 BCx x2 >> NGx2D 12/18 UCx >> NG 12/18 MRSA PCR positive  12/19 TA - moderate C.albicans 12/21 BCx x2 >> ngtd 12/28 BCx x2 -    Niaya Hickok D. Laney Potashang, PharmD, BCPS Pager:  [phone] 10/14/2015, 8:32 AM

## 2015-10-14 NOTE — Evaluation (Signed)
Physical Therapy Evaluation Patient Details Name: Adrian DiegoCarlton B Norling Jr. MRN: [id number] DOB: Oct 06, 1961 Today's Date: 10/14/2015   History of Present Illness  54 yo PMH: morbid obesity (527 lbs), HTN, hyperlipidemia, DM, bipolar, OSA, drainage from left leg consistent [facility]ulitis [facility] where he presented with SOB. Found to have strep,with general decline and [facility]. Intubated 12/29, trach 12/23 vent, started trach collar 12/26.   Clinical Impression  Pt admitted with above diagnosis. Pt currently with functional limitations due to the deficits listed below (see PT Problem List). Pt with minimal participation in therapy at this time, partially due to cognitive status and partially because of weakness. Will put him on 2x/ week trial to see if increased stimulation leads to improvement.  Pt will benefit from skilled PT to increase their independence and safety with mobility to allow discharge to the venue listed below.       Follow Up Recommendations LTACH;Supervision/Assistance - 24 hour    Equipment Recommendations  None recommended by PT    Recommendations for Other Services OT consult     Precautions / Restrictions Precautions Precautions: Fall Precaution Comments: trach, rectal tube, NG tube Restrictions Weight Bearing Restrictions: No      Mobility  Bed Mobility Overal bed mobility: Needs Assistance;+2 for physical assistance (+3 or more)             General bed mobility comments: pt unable to grasp rail for assist with rolling. Attempted to guide pt's right hand to head board for assist with scooting up in bed but pt unable to hold on. Tot A +3 for 6" scoot up in bed  Transfers                 General transfer comment: unable to transfer at this time  Ambulation/Gait             General Gait Details: unable  Stairs            Wheelchair Mobility    Modified Rankin (Stroke Patients Only)       Balance                                             Pertinent Vitals/Pain Pain Assessment: Faces Faces Pain Scale: Hurts a little bit Pain Location: shoulders with passive elevation, L>R Pain Descriptors / Indicators: Pressure Pain Intervention(s): Monitored during session;Limited activity within patient's tolerance    Home Living Family/patient expects to be discharged to:: Skilled nursing facility Living Arrangements: Parent;Other relatives               Additional Comments: pt lived with mother PTA    Prior Function Level of Independence: Needs assistance   Gait / Transfers Assistance Needed: pt has been unable to ambulate in 4-5 yrs per his report. Stays on the couch at his mother's home, [facility], does not leave house, MD peforms home visits  ADL's / Homemaking Assistance Needed: mother washes him off on couch  Comments: unreliable historian as pt with intermittent confusion and word finding difficulties     Hand Dominance   Dominant Hand: Right    Extremity/Trunk Assessment   Upper Extremity Assessment: RUE deficits/detail;LUE deficits/detail;Defer to OT evaluation RUE Deficits / Details: pt able to elevate shoulder approx 45 degrees and then needs assistance. Unable to grasp bed rails and hold on to  help with bed mobility     LUE Deficits / Details: minimal mvmt at shoulder or elbow. Pt's arm wedged between self and bed upon PT arrival and pt unaware. C/o left shoulder discomfort with passive elevation. Reports it has been like this awhile but does not know why or how long. No use of LUE on command during eval   Lower Extremity Assessment: RLE deficits/detail;LLE deficits/detail RLE Deficits / Details: hip flex 2-/5, knee flex 2-/5, knee ext 2-/5, cellulitis noted LLE Deficits / Details: same as RLE     Communication   Communication: Tracheostomy;Expressive difficulties  Cognition Arousal/Alertness: Awake/alert Behavior During  Therapy: Flat affect Overall Cognitive Status: Impaired/Different from baseline Area of Impairment: Orientation;Memory;Following commands;Problem solving Orientation Level: Disoriented to;Situation;Time   Memory: Decreased short-term memory Following Commands: Follows one step commands inconsistently;Follows one step commands with increased time     Problem Solving: Slow processing;Decreased initiation;Requires tactile cues;Requires verbal cues General Comments: pt with fluctuating cognition, calling out for help and lip balm and a frink frequently when no one in room. Does not remember that he is NPO with NG tube, decreased insight into limitations    General Comments General comments (skin integrity, edema, etc.): Attempted to put bed in chair position but sizewise bed would not perform this function effectively to get chest fully upright and legs down    Exercises General Exercises - Lower Extremity Ankle Circles/Pumps: AROM;Both;20 reps;Supine Short Arc Quad: AAROM;Both;10 reps;Supine Heel Slides: AAROM;Both;10 reps;Supine Straight Leg Raises: AAROM;Both;5 reps;Supine      Assessment/Plan    PT Assessment Patient needs continued PT services  PT Diagnosis Generalized weakness;Altered mental status   PT Problem List Decreased strength;Decreased range of motion;Decreased activity tolerance;Decreased mobility;Decreased coordination;Decreased cognition;Decreased safety awareness;Decreased knowledge of precautions;Cardiopulmonary status limiting activity;Obesity;Decreased skin integrity;Pain  PT Treatment Interventions Functional mobility training;Therapeutic activities;Therapeutic exercise;Patient/family education;Cognitive remediation;Neuromuscular re-education;Balance training   PT Goals (Current goals can be found in the Care Plan section) Acute Rehab PT Goals Patient Stated Goal: get lip balm and get a drink PT Goal Formulation: Patient unable to participate in goal setting Time  For Goal Achievement: 10/28/15 Potential to Achieve Goals: Poor    Frequency Min 2X/week   Barriers to discharge        [name]-evaluation               End of Session Equipment Utilized During Treatment: Oxygen Activity Tolerance: Patient limited by lethargy;Patient limited by fatigue Patient left: in bed;with call bell/phone within reach;with family/visitor present Nurse Communication: Mobility status         Time: 0932-1007 PT Time Calculation (min) (ACUTE ONLY): 35 min   Charges:   PT Evaluation $Initial PT Evaluation Tier I: 1 Procedure PT Treatments $Therapeutic Exercise: 8-22 mins   PT G Codes:      Adrian [name], PT  Acute Rehab Services  [phone]   Adrian [name] 10/14/2015, 11:53 AM

## 2015-10-14 NOTE — Evaluation (Signed)
Clinical/Bedside Swallow Evaluation Patient Details  Name: Adrian [name]. MRN: [id number] Date of Birth: 12/19/1960  Today's Date: 10/14/2015 Time: SLP Start Time (ACUTE ONLY): 1409 SLP Stop Time (ACUTE ONLY): 1420 SLP Time Calculation (min) (ACUTE ONLY): 11 min  Past Medical History:  Past Medical History  Diagnosis Date  . Asthma   . Hypertension   . Hyperlipemia   . Diabetes mellitus without complication (HCC)   . Hypothyroidism   . Bipolar disorder (HCC)   . Sleep apnea, obstructive   . Depression    Past Surgical History:  Past Surgical History  Procedure Laterality Date  . Tonsillectomy    . Bone removed      bone removed in foot due to infection    HPI:  54 yo PMH: morbid obesity (527 lbs), HTN, hyperlipidemia, DM, bipolar, OSA, drainage from left leg consistent cellulitis [facility] where he presented with SOB. Found to have strep,with general decline and [facility]. Intubated 12/19, trach 12/23 vent, started trach collar 12/26.   Assessment / Plan / Recommendation Clinical Impression  PMSV donned during swallow assessment. Wet vocal quality detected that cleared after placement of speaking valve. Possible penetration/aspiration with thin water indicated by delayed cough. Given intubation, trach, deconditioning and wet vocal qualtiy, FEES is indicated for 12/29.     Aspiration Risk  Moderate aspiration risk    Diet Recommendation NPO   Medication Administration: Via alternative means    Other  Recommendations Oral Care Recommendations: Oral care QID   Follow up Recommendations  LTACH    Frequency and Duration            Prognosis        Swallow Study   General HPI: 54 yo PMH: morbid obesity (527 lbs), HTN, hyperlipidemia, DM, bipolar, OSA, drainage from left leg consistent [facility] he presented with SOB. Found to have strep,with general decline and [facility]. Intubated  12/19, trach 12/23 vent, started trach collar 12/26. Type of Study: Bedside Swallow Evaluation Previous Swallow Assessment:  (none) Diet Prior to this Study: NPO;NG Tube Temperature Spikes Noted: Yes Respiratory Status: Trach;Trach Collar Trach Size and Type: #6;Cuff;Deflated;With PMSV in place History of Recent Intubation: Yes Length of Intubations (days):  (5) Date extubated:  (trach 12/23) Behavior/Cognition: Alert;Confused;Cooperative;Pleasant mood;Requires cueing Oral Cavity Assessment: Dry Oral Care Completed by SLP: Yes Oral Cavity - Dentition: Adequate natural dentition Vision: Functional for self-feeding Self-Feeding Abilities: Needs set up;Needs assist Patient Positioning: Upright in bed Baseline Vocal Quality: Hoarse Volitional Cough: Weak Volitional Swallow: Able to elicit    Oral/Motor/Sensory Function Overall Oral Motor/Sensory Function: Within functional limits   Ice Chips Presentation: Spoon   Thin Liquid Thin Liquid: Impaired Presentation: Spoon Oral Phase Impairments: Reduced labial seal Oral Phase Functional Implications: Right anterior spillage Pharyngeal  Phase Impairments: Cough - Delayed    Nectar Thick Nectar Thick Liquid: Not tested   Honey Thick Honey Thick Liquid: Not tested   Puree Puree: Within functional limits   Solid Solid: Not tested       Royce MacadamiaLitaker, Chares Slaymaker Willis 10/14/2015,3:36 PM  Breck CoonsLisa Willis Lonell FaceLitaker M.Ed [employer] Pager [phone]

## 2015-10-14 NOTE — Progress Notes (Signed)
CRITICAL VALUE ALERT  Critical value received:  Blood cultures positive for gram positive cocci in clusters in anaerobic bottle   Date of notification:  10/14/2015   Time of notification: 2313  Critical value read back:Yes.    Nurse who received alert:  Reap, Randon Goldsmithebecca L   MD notified (1st page):  TRH  Time of first page:  2313  MD notified (2nd page):  Time of second page:  Responding MD:    Time MD responded:

## 2015-10-14 NOTE — Progress Notes (Signed)
eLink Physician-Brief Progress Note Patient Name: Adrian DiegoCarlton B Bertelson Jr. DOB: Feb 24, 1961 MRN: [id number]   Date of Service  10/14/2015  HPI/Events of Note  Multiple issues: 1. Patient confused - states that he needs 'help' - [place] is on [place] and Trileptal for BiPolar disorder. Question how much is related to his base, emergency from sedatives (Fentanyl and Precedex) stoped earlier today or Fever. , 2. Fever to 102.6 F and 3. Sinus Tachycardia - HR = 110. HR is likely related to fever. Patient is already on Levaquin.   eICU Interventions  Will order: 1. Tylenol and ice packs for fever. Move to cooling blanket is these measures not effective.  2. Follow mental status and HR with resolution of fever.  3. Blood Cultures X 2 now.      Intervention Category Major Interventions: Infection - evaluation and management  Sommer,Steven Eugene 10/14/2015, 1:17 AM

## 2015-10-14 NOTE — Progress Notes (Signed)
[facility] TEAM 1 - Stepdown/ICU TEAM PROGRESS NOTE  Adrian Diegoarlton B Koziol Jr. ZOX:[id number] DOB: 1961-08-23 DOA: 10/04/2015 PCP: No PCP Per Patient  Admit HPI / Brief Narrative: 54 yo who presentedto [facility] with SOB, he is 527 lbs and has OSA, also has drainage from left leg and panus consistent with cellulitis.  Significant Events: [facility] 12/18 [facility] 12/23 Tracheostomy 12/23 TEE - normal LV fxn - no vegetation 12/25 off vent during daytime 12/27 off vent > TC  HPI/Subjective: The pt is alert, but appears somewhat confused.  He does not answer some of my questions, but simply stares at me instead.  He does however deny any complaints. He is in no apparent acute distress.     Assessment/Plan:  Cellulitis of panus with chronic wound - Group G strep bacteremia TEE negative for vegetations - current plan is to complete 14 days of levaquin - presently WBC climbing, and pt had fever to 103 last PM - agree w/ checking for Cdiff - urine sent for UA/culture   ARDS continuing to improve - PCCM following trach/pulm issues for now   Dysphagia SLP eval today - suggested NPO status for now - FEES planned for 12/29  Hx of asthma  No evidence of wheezing today   Acute pulmonary edema with total body volume overload Net negative ~16L thus far - follow Is/Os and daily weights  Hypernatremia Follow - getting high volume of free water via feeding tube - hold diuretic for now   Obstructive sleep apnea / Obesity hypoventilation  Now s/p trach w/ ongoing trach care per PCCM - off vent w/ TC only   HTN Poorly controlled - adjust medical tx and follow   Super morbid obesity - Body mass index is 61.52 kg/(m^2).   Bipolar D/O  DM Fairly well controlled at this time   Hypothyroid TSH 4.5 12/16 - will consider adjusting dose as pt becomes more stable   MRSA screen +  Code Status: FULL Family Communication: no  family present at time of exam Disposition Plan: SDU   Consultants: PCCM  Antibiotics: Ceftriaxone 12/17 > 12/19 Vanco 12/18 > 12/22 Clinda 12/19 > 12/19 Zosyn 12/19 > 12/22 Levaquin 12/22 >  DVT prophylaxis: SQ heparin   Objective: Blood pressure 169/81, pulse 87, temperature 99 F (37.2 C), temperature source Core (Comment), resp. rate 22, height 5\' 11"  (1.803 m), weight 200 kg (440 lb 14.7 oz), SpO2 94 %.  Intake/Output Summary (Last 24 hours) at 10/14/15 1632 Last data filed at 10/14/15 1000  Gross per 24 hour  Intake   1660 ml  Output   1900 ml  Net   -240 ml   Exam: General: No acute respiratory distress - alert  Lungs: very distant breath sounds th/o - no wheeze  Cardiovascular: Regular rate and rhythm - very distant heart sounds  Abdomen: severe obesity w/ large pannus, soft, bowel sounds positive Extremities: No significant cyanosis, or clubbing;  3+ edema bilateral lower extremities  Data Reviewed: Basic Metabolic Panel:  Recent Labs Lab 10/08/15 0356 10/09/15 0448 10/10/15 0030 10/10/15 0429  10/11/15 0425  10/11/15 2200 10/12/15 0106 10/12/15 0500 10/13/15 0230 10/14/15 0144  NA 146* 153* 157* 156*  < > 155*  < > 154* 155* 155* 155* 156*  K 3.9 4.1 4.0 4.0  < > 3.6  < > 4.2 4.2 4.5 4.2 4.5  CL 107 112* 115* 114*  < >  114*  < > 114* 113* 113* 113* 112*  CO2 32  < > 30  < > GLUCOSE 145* 116* 135* 130*  < > 151*  < > 161* 149* 156* 131* 138*  BUN 67* 72* 71* 73*  < > 70*  < > 68* 74* 75* 76* 65*  CREATININE 2.30* 2.19* 1.97* 1.92*  < > 1.71*  < > 1.61* 1.64* 1.69* 1.66* 1.72*  CALCIUM 7.7* 8.2* 8.7* 8.7*  < > 8.8*  < > 9.0 8.8* 8.6* 8.7* 8.8*  MG 3.0* 2.8* 2.6* 2.6*  --  2.2  --   --   --  2.3  --   --   PHOS 5.4* 4.7*  --  4.7*  --  4.5  --   --   --  5.1*  --   --   < > = values in this interval not displayed.  CBC:  Recent Labs Lab 10/10/15 0429 10/11/15 0425 10/12/15 0500 10/13/15 0230 10/14/15 1305  WBC 11.5*  10.1 10.7* 16.4* 10.5  NEUTROABS  --   --   --  13.2*  --   HGB 8.2* 8.5* 8.4* 9.0* 9.2*  HCT 27.2* 27.4* 28.5* 30.8* 31.5*  MCV 94.4 93.8 93.1 92.2 93.2  PLT 375 349 370 456* 355    Liver Function Tests:  Recent Labs Lab 10/12/15 0500  AST 46*  ALT 81*  ALKPHOS 81  BILITOT 0.3  PROT 6.0*  ALBUMIN 2.1*    Coags:  Recent Labs Lab 10/09/15 0448  INR 1.40    Recent Labs Lab 10/09/15 0448  APTT 32    Cardiac Enzymes:  Recent Labs Lab 10/10/15 0030 10/10/15 0400 10/10/15 1300  TROPONINI 0.04* 0.04* 0.03    CBG:  Recent Labs Lab 10/13/15 2016 10/14/15 0056 10/14/15 0452 10/14/15 0816 10/14/15 1245  GLUCAP 117* 129* 127* 129* 114*    Recent Results (from the past 240 hour(s))  MRSA PCR Screening     Status: Abnormal   Collection Time: 10/04/15 10:24 PM  Result Value Ref Range Status   MRSA by PCR POSITIVE (A) NEGATIVE Final    Comment:        The GeneXpert MRSA Assay (FDA approved for NASAL specimens only), is one component of a comprehensive MRSA colonization surveillance program. It is not intended to diagnose MRSA infection nor to guide or monitor treatment for MRSA infections. RESULT CALLED TO, READ BACK BY AND VERIFIED WITH: STOWE,S RN 0023 10/05/15 MITCHELL,L   Urine culture     Status: None   Collection Time: 10/04/15 11:39 PM  Result Value Ref Range Status   Specimen Description URINE, CATHETERIZED  Final   Special Requests NONE  Final   Culture NO GROWTH 1 DAY  Final   Report Status 10/06/2015 FINAL  Final  Culture, blood (routine x 2)     Status: None   Collection Time: 10/04/15 11:55 PM  Result Value Ref Range Status   Specimen Description BLOOD LEFT ANTECUBITAL  Final   Special Requests IN PEDIATRIC BOTTLE 4CC  Final   Culture  Setup Time   Final    GRAM POSITIVE COCCI IN CLUSTERS AEROBIC BOTTLE ONLY CRITICAL RESULT CALLED TO, READ BACK BY AND VERIFIED WITH: Coralie Carpen RN 1929 10/05/15 A BROWNING    Culture   Final     STAPHYLOCOCCUS SPECIES (COAGULASE NEGATIVE) THE SIGNIFICANCE OF ISOLATING THIS ORGANISM FROM A SINGLE SET OF BLOOD CULTURES WHEN MULTIPLE SETS  ARE DRAWN IS UNCERTAIN. PLEASE NOTIFY THE MICROBIOLOGY DEPARTMENT WITHIN ONE WEEK IF SPECIATION AND SENSITIVITIES ARE REQUIRED.    Report Status 10/07/2015 FINAL  Final  Culture, blood (routine x 2)     Status: None   Collection Time: 10/05/15 12:28 AM  Result Value Ref Range Status   Specimen Description BLOOD RIGHT HAND  Final   Special Requests BOTTLES DRAWN AEROBIC ONLY 6CC  Final   Culture NO GROWTH 5 DAYS  Final   Report Status 10/10/2015 FINAL  Final  Culture, respiratory (NON-Expectorated)     Status: None   Collection Time: 10/05/15  1:25 AM  Result Value Ref Range Status   Specimen Description TRACHEAL ASPIRATE  Final   Special Requests NONE  Final   Gram Stain   Final    FEW WBC PRESENT,BOTH PMN AND MONONUCLEAR FEW SQUAMOUS EPITHELIAL CELLS PRESENT FEW YEAST WITH PSEUDOHYPHAE RARE GRAM POSITIVE COCCI IN PAIRS    Culture   Final    MODERATE CANDIDA ALBICANS Performed at [facility]    Report Status 10/07/2015 FINAL  Final  Culture, blood (Routine X 2) w Reflex to ID Panel     Status: None   Collection Time: 10/07/15 12:40 PM  Result Value Ref Range Status   Specimen Description BLOOD LEFT HAND  Final   Special Requests BOTTLES DRAWN AEROBIC AND ANAEROBIC 5CC  Final   Culture NO GROWTH 5 DAYS  Final   Report Status 10/12/2015 FINAL  Final  Culture, blood (Routine X 2) w Reflex to ID Panel     Status: None   Collection Time: 10/07/15 12:46 PM  Result Value Ref Range Status   Specimen Description BLOOD LEFT ANTECUBITAL  Final   Special Requests BOTTLES DRAWN AEROBIC AND ANAEROBIC 5CC  Final   Culture NO GROWTH 5 DAYS  Final   Report Status 10/12/2015 FINAL  Final     Studies:   Recent x-ray studies have been reviewed in detail by the Attending Physician  Scheduled Meds:  Scheduled Meds: . amLODipine  10 mg  Oral Daily  . antiseptic oral rinse  7 mL Mouth Rinse 10 times per day  . chlorhexidine gluconate  15 mL Mouth Rinse BID  . collagenase   Topical Daily  . feeding supplement (PRO-STAT SUGAR FREE 64)  60 mL Per Tube BID  . free water  400 mL Per Tube Q3H  . heparin subcutaneous  5,000 Units Subcutaneous 3 times per day  . insulin aspart  0-9 Units Subcutaneous 6 times per day  . levofloxacin (LEVAQUIN) IV  750 mg Intravenous Q24H  . metoprolol tartrate  25 mg Per Tube BID  . oxcarbazepine  600 mg Per Tube BID  . pantoprazole sodium  40 mg Per Tube Daily  . sodium chloride  10-40 mL Intracatheter Q12H  . spironolactone  25 mg Oral Daily  . ziprasidone  80 mg Oral BID WC    Time spent on care of this patient: 35 mins   Pami Wool T , MD   [facility] Office  [phone] Pager - Text Page per Loretha Stapler as per below:  On-Call/Text Page:      [email or web address]      password [email or web address]  If 7PM-7AM, please contact night-coverage [email or web address] Password [email or web address] 10/14/2015, 4:32 PM   LOS: 10 days

## 2015-10-14 NOTE — Progress Notes (Signed)
Speech Language Pathology Treatment: [name] Speaking valve  Patient Details Name: Adrian DiegoCarlton B Schweikert Jr. MRN: [id number] DOB: [birth year] Today's Date: 10/14/2015 Time: [id number] SLP Time Calculation (min) (ACUTE ONLY): 13 min  Assessment / Plan / Recommendation Clinical Impression  Pt tolerated PMSV for approximately 20-25 minutes following cuff deflation. Vitals remained stable, no back pressure present when valve doffed indicating adequate exhalation via mouth/nose. Verbal demonstration provided to coordinate and maximize inhalation with verbal expression. Goal is to increase length of utterance which is currently one word per breath. Per MD notes, treatment plan is change trach to cuffless. PMSV with SLP only with full supervision.    HPI HPI: 54 yo PMH: morbid obesity (527 lbs), HTN, hyperlipidemia, DM, bipolar, OSA, drainage from left leg consistent cellulitis [facility] he [facility] with SOB. Found to have strep,with general decline and [facility]. Intubated 12/29, trach 12/23 vent, started trach collar 12/26.      SLP Plan  Continue with current plan of care (FEES)     Recommendations  Medication Administration: Via alternative means      Patient may use Passy-Muir Speech Valve: with SLP only PMSV Supervision: Full MD: Please consider changing trach tube to : Cuffless       Oral Care Recommendations: Oral care QID Follow up Recommendations:  (TBD) Plan: Continue with current plan of care (FEES)   Royce MacadamiaLitaker, Peg Fifer Willis 10/14/2015, 3:14 PM  Breck CoonsLisa Willis Lonell FaceLitaker M.Ed [employer] Pager [phone]

## 2015-10-14 NOTE — Progress Notes (Signed)
PULMONARY / CRITICAL CARE MEDICINE   Name: Adrian DiegoCarlton B Egger Jr. MRN: [id number] DOB: June 05, 1961    ADMISSION DATE:  10/04/2015  [facility]  CHIEF COMPLAINT: SOB  HISTORY OF PRESENT ILLNESS:   54 yo who [facility], he is 527 lbs and has OSA, also has drainage from left leg and panus consistent with cellulitis.  SUBJECTIVE:  Febrile overnight. On day 7 of Levaquin; cultures sent. Tolerating TC  VITAL SIGNS: BP 143/58 mmHg  Pulse 83  Temp(Src) 99.6 F (37.6 C) (Core (Comment))  Resp 20  Ht 5\' 11"  (1.803 m)  Wt 200 kg (440 lb 14.7 oz)  BMI 61.52 kg/m2  SpO2 96%  HEMODYNAMICS:    VENTILATOR SETTINGS: Vent Mode:  [-]  FiO2 (%):  [28 %] 28 %  INTAKE / OUTPUT: I/O last 3 completed shifts: In: 4442.7 [I.V.:842.7; NG/GT:3500; IV Piggyback:100] Out: 6950 [Urine:6950]  PHYSICAL EXAMINATION:  Gen: awake, no distress Lungs: Bilateral airflow; no wheezing CV: RRR, distant, no clear murmur GI: Obese, soft, nontender, BS+ Neuro: awake and alert, answering questions and following commands Skin: massive panus, chronic appearing wound well dressed in panus, no drainage, fungal appearing erythema under panus in fold   LABS:  BMET  Recent Labs Lab 10/12/15 0500 10/13/15 0230 10/14/15 0144  NA 155* 155* 156*  K 4.5 4.2 4.5  CL 113* 113* 112*  CO2 31 29 29   BUN 75* 76* 65*  CREATININE 1.69* 1.66* 1.72*  GLUCOSE 156* 131* 138*   Electrolytes  Recent Labs Lab 10/10/15 0429  10/11/15 0425  10/12/15 0500 10/13/15 0230 10/14/15 0144  CALCIUM 8.7*  < > 8.8*  < > 8.6* 8.7* 8.8*  MG 2.6*  --  2.2  --  2.3  --   --   PHOS 4.7*  --  4.5  --  5.1*  --   --   < > = values in this interval not displayed. CBC  Recent Labs Lab 10/11/15 0425 10/12/15 0500 10/13/15 0230  WBC 10.1 10.7* 16.4*  HGB 8.5* 8.4* 9.0*  HCT 27.4* 28.5* 30.8*  PLT 349 370 456*   Coag's  Recent Labs Lab 10/09/15 0448  APTT 32  INR 1.40   Sepsis  Markers No results for input(s): LATICACIDVEN, PROCALCITON, O2SATVEN in the last 168 hours. ABG  Recent Labs Lab 10/08/15 0256 10/09/15 0259 10/10/15 0350  PHART 7.346* 7.388 7.363  PCO2ART 50.1* 55.9* 52.8*  PO2ART 99.0 80.0 118*   Liver Enzymes  Recent Labs Lab 10/12/15 0500  AST 46*  ALT 81*  ALKPHOS 81  BILITOT 0.3  ALBUMIN 2.1*   Cardiac Enzymes  Recent Labs Lab 10/10/15 0030 10/10/15 0400 10/10/15 1300  TROPONINI 0.04* 0.04* 0.03   Glucose  Recent Labs Lab 10/13/15 1105 10/13/15 1725 10/13/15 2016 10/14/15 0056 10/14/15 0452 10/14/15 0816  GLUCAP 134* 124* 117* 129* 127* 129*   Imaging No results found. STUDIES:  12/23 TEE with no vegetations  CULTURES: 12/16 bc [facility] >> Strep group g 2 of 2>>levaquin sensitive 12/16 [facility] >> multiple species 12/18 urine >> negative 12/19 Resp >> moderate candida 12/18 Blood >> Coag negative staph  12/21 Blood >> No growth  ANTIBIOTICS: Ceftriaxone 12/17 >> 12/19 Vanco 12/18 >>12/22 Clinda 12/19 >> 12/19 Zosyn 12/19 >>12/22 Levaquin 12/22>>> NGTD  SIGNIFICANT EVENTS: [facility] 12[facility]18 [facility] 12/23 Tracheostomy 12/25 off vent during daytime 12/27 off vent>TC  LINES/TUBES: ETT 12/18 >>12/23 Tracheostomy Shiley 6 mm Cuffed (JY) 12/23>>> L IJ TLC 12/20>>> 12/26  DISCUSSION: Morbidly obese male with panus and leg infections, sepsis, and hypoxic respiratory failure due to ALI superimposed on OHS +/- sedating meds. Intubated 12/18; tracheostomy 12/23, now with improving oxygenation, remains off vent and tolerating TC  ASSESSMENT / PLAN:  PULMONARY A:  ARDS-continuing to improve ?HCAP> resolved Hx of asthma  Acute pulmonary edema with total body volume overload Obstructive sleep apnea> resolved with trach Obesity hypoventilation syndrome > currently doing well with out vent support P:   Continue PMV trials Will likely  downsize trach at 7 day trach change to cuffless, but do not plan to de-cannulate anytime soon Wean O2 for O2 saturation > 92% Pulmonary hygiene  INFECTIOUS A:   Cellulitis of panus with chronic wound Group G strep bacteremia, TEE negative for vegetations HCAP / aspiration PNA; CXR 12/28 without  infiltrates Fever and leukocytosis P:   Levaquin as ordered > plan 14 days given severity of illness Appreciate WOC consult > dressing changes and other intervention per their recs F/U blood cultures Will send  urine cultures Will consider expanding antibiotic coverage if persistent fevers and worsening leukocytosis   Rest of treatment plan per [facility] PCCM to follow for respiratory status/trach  FAMILY  - Updates: No family bedside 12/28   - Inter-disciplinary family meet or Palliative Care meeting due by:  12/30  Magdalene S. Freeman [facility] ANP-BC Pulmonary and Critical Care Medicine [facility] Pager: [phone]

## 2015-10-15 DIAGNOSIS — B955 Unspecified streptococcus as the cause of diseases classified elsewhere: Secondary | ICD-10-CM | POA: Diagnosis present

## 2015-10-15 DIAGNOSIS — R7881 Bacteremia: Secondary | ICD-10-CM

## 2015-10-15 DIAGNOSIS — G4733 Obstructive sleep apnea (adult) (pediatric): Secondary | ICD-10-CM

## 2015-10-15 DIAGNOSIS — F319 Bipolar disorder, unspecified: Secondary | ICD-10-CM | POA: Diagnosis present

## 2015-10-15 DIAGNOSIS — R1310 Dysphagia, unspecified: Secondary | ICD-10-CM

## 2015-10-15 DIAGNOSIS — L03311 Cellulitis of abdominal wall: Secondary | ICD-10-CM | POA: Diagnosis present

## 2015-10-15 DIAGNOSIS — E662 Morbid (severe) obesity with alveolar hypoventilation: Secondary | ICD-10-CM | POA: Diagnosis present

## 2015-10-15 DIAGNOSIS — E038 Other specified hypothyroidism: Secondary | ICD-10-CM

## 2015-10-15 LAB — BASIC METABOLIC PANEL
ANION GAP: 14 (ref 5–15)
BUN: 59 mg/dL — ABNORMAL HIGH (ref 6–20)
CALCIUM: 9.3 mg/dL (ref 8.9–10.3)
CHLORIDE: 117 mmol/L — AB (ref 101–111)
CO2: 32 mmol/L (ref 22–32)
Creatinine, Ser: 1.44 mg/dL — ABNORMAL HIGH (ref 0.61–1.24)
GFR calc non Af Amer: 54 mL/min — ABNORMAL LOW (ref >60)
GLUCOSE: 128 mg/dL — AB (ref 65–99)
POTASSIUM: 4.1 mmol/L (ref 3.5–5.1)
Sodium: 163 mmol/L (ref 135–145)

## 2015-10-15 LAB — CBC
HEMATOCRIT: 34 % — AB (ref 39.0–52.0)
HEMOGLOBIN: 9.9 g/dL — AB (ref 13.0–17.0)
MCH: 27.8 pg (ref 26.0–34.0)
MCHC: 29.1 g/dL — ABNORMAL LOW (ref 30.0–36.0)
MCV: 95.5 fL (ref 78.0–100.0)
Platelets: 362 10*3/uL (ref 150–400)
RBC: 3.56 MIL/uL — AB (ref 4.22–5.81)
RDW: 18.2 % — ABNORMAL HIGH (ref 11.5–15.5)
WBC: 11.4 10*3/uL — ABNORMAL HIGH (ref 4.0–10.5)

## 2015-10-15 LAB — GLUCOSE, CAPILLARY
GLUCOSE-CAPILLARY: 131 mg/dL — AB (ref 65–99)
GLUCOSE-CAPILLARY: 127 mg/dL — AB (ref 65–99)
GLUCOSE-CAPILLARY: 115 mg/dL — AB (ref 65–99)
Glucose-Capillary: 109 mg/dL — ABNORMAL HIGH (ref 65–99)
Glucose-Capillary: 126 mg/dL — ABNORMAL HIGH (ref 65–99)
Glucose-Capillary: 134 mg/dL — ABNORMAL HIGH (ref 65–99)

## 2015-10-15 LAB — C DIFFICILE QUICK SCREEN W PCR REFLEX
C DIFFICILE (CDIFF) TOXIN: NEGATIVE
C DIFFICLE (CDIFF) ANTIGEN: NEGATIVE
C Diff interpretation: NEGATIVE

## 2015-10-15 LAB — URINE CULTURE: CULTURE: NO GROWTH

## 2015-10-15 LAB — MAGNESIUM: MAGNESIUM: 2.8 mg/dL — AB (ref 1.7–2.4)

## 2015-10-15 MED ORDER — VITAL AF 1.2 CAL PO LIQD: 1000.0000 mL | ORAL | Status: DC

## 2015-10-15 MED ORDER — METOPROLOL TARTRATE 25 MG/10 ML ORAL SUSPENSION
75.0000 mg | Freq: Two times a day (BID) | ORAL | Status: DC
  Administered 2015-10-15: 75 mg
  Filled 2015-10-15: qty 30

## 2015-10-15 MED ORDER — LEVOTHYROXINE SODIUM 100 MCG PO TABS
100.0000 ug | ORAL_TABLET | Freq: Every day | ORAL | Status: DC
  Administered 2015-10-16 – 2015-10-19 (×4): 100 ug
  Filled 2015-10-15 (×4): qty 1

## 2015-10-15 MED ORDER — VITAL AF 1.2 CAL PO LIQD
1000.0000 mL | ORAL | Status: DC
  Administered 2015-10-15 – 2015-10-16 (×2): 1000 mL
  Filled 2015-10-15 (×6): qty 1000

## 2015-10-15 MED ORDER — DEXTROSE 5 % IV SOLN
INTRAVENOUS | Status: DC
  Administered 2015-10-15: 22:00:00 via INTRAVENOUS
  Administered 2015-10-18: 400 mL via INTRAVENOUS
  Administered 2015-10-18 – 2015-10-19 (×3): via INTRAVENOUS

## 2015-10-15 MED ORDER — FREE WATER
500.0000 mL | Status: DC
  Administered 2015-10-16 – 2015-10-19 (×22): 500 mL

## 2015-10-15 NOTE — Progress Notes (Signed)
[place] TEAM 1 - Stepdown/ICU TEAM Progress Note  Adrian Diegoarlton B Stroble Jr. ZOX:[id number] DOB: Jul 02, 1961 DOA: 10/04/2015 PCP: No PCP Per Patient  Admit HPI / Brief Narrative: 54 yo WM PMHx MORBIDLY obese (527 pounds), OSA/OHS,  [facility]B, drainage from left leg and panus consistent with cellulitis.   HPI/Subjective: A/O 4, NAD. Patient's only concern is ability to watch television. Not interested in treatment plan.  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 or downsize   Dysphagia SLP eval today - suggested NPO status for now - FEES planned for 12/29  Hx of asthma  No evidence of wheezing today   Acute pulmonary edema with total body volume overload Net negative ~16L thus far - follow Is/Os and daily weights  Hypernatremia -Worsening increase free water via feeding tube to 500 ml q 3 hr  -D5W 6875ml/hr  Obstructive sleep apnea / Obesity hypoventilation Syndrome -Now s/p trach w/ ongoing trach care per PCCM - off vent w/ TC only   HTN -Continue amlodipine 10 mg daily  -Increase metoprolol 75 mg  BID   Super morbid obesity - Body mass index is 61.52 kg/(m^2).   Bipolar D/O  DM Type 2 -Fairly well controlled at this time   Hypothyroid -12/16 TSH =4.5  -Increase Synthroid to 100 g daily  MRSA screen +    Code Status: FULL Family Communication: no family present at time of exam Disposition Plan: SNF    [facility]  Procedure/Significant Events: [facility] 12/18 Transfer to [facility] due to no ICU  [facility] 12/23 Tracheostomy 12/23 TEE - normal LV fxn - no vegetation 12/25 off vent during daytime 12/27 off vent > TC   Culture   Antibiotics: Levofloxacin 12/22>> Vancomycin 12/201 dose : 12/29>>  DVT prophylaxis: Heparin subcutaneous   Devices    LINES / TUBES:      Continuous Infusions: . sodium chloride Stopped (10/11/15 0300)  . dextrose    . feeding supplement (VITAL AF 1.2 CAL)      Objective: VITAL SIGNS: Temp: 99.1 F (37.3 C) (12/29 1941) Temp Source: Oral (12/29 1941) BP: 158/74 mmHg (12/29 2031) Pulse Rate: 103 (12/29 2031) SPO2; FIO2:   Intake/Output Summary (Last 24 hours) at 10/15/15 2120 Last data filed at 10/15/15 1800  Gross per 24 hour  Intake   2300 ml  Output   3950 ml  Net  -1650 ml     Exam: General: A/O 4, patient only interested in watching television, positive acute respiratory distress (now on tracheostomy) Eyes: Negative headache, negative scleral hemorrhage ENT: Negative Runny nose, negative ear pain, negative gingival bleeding Neck:  Negative scars, masses, torticollis, lymphadenopathy, JVD Lungs: Clear to auscultation bilaterally without wheezes or crackles (extremely difficult to hear secondary to MORBID OBESITY) Cardiovascular: Regular rate and rhythm without murmur gallop or rub normal S1 and S2 (extremely difficult to hear secondary to MORBID OBESITY)  Abdomen: negative abdominal pain, nondistended, positive soft, bowel sounds, no rebound, no ascites, no appreciable mass Extremities: No significant cyanosis, clubbing, or edema bilateral lower extremities Psychiatric:  Negative depression, negative anxiety, negative fatigue, negative mania  Neurologic:  Cranial nerves II through XII intact, tongue/uvula midline, all extremities muscle strength 5/5, sensation intact throughout, negative dysarthria, negative expressive aphasia, negative receptive aphasia.   Data Reviewed: Basic Metabolic Panel:  Recent  Labs Lab 10/09/15 0448 10/10/15 0030 10/10/15 0429  10/11/15 0425  10/12/15 0106 10/12/15 0500 10/13/15 0230 10/14/15 0144 10/15/15 0302  NA 153* 157* 156*  < > 155*  < > 155* 155* 155* 156* 163*  K 4.1 4.0 4.0  < > 3.6  < > 4.2 4.5 4.2 4.5 4.1  CL 112* 115* 114*  < > 114*  < > 113* 113* 113* 112* 117*  CO2 31 31 32  < > 30  < > 32  GLUCOSE 116* 135* 130*  < > 151*  < > 149* 156* 131* 138* 128*  BUN 72* 71* 73*  < > 70*  < > 74* 75* 76* 65* 59*  CREATININE 2.19* 1.97* 1.92*  < > 1.71*  < > 1.64* 1.69* 1.66* 1.72* 1.44*  CALCIUM 8.2* 8.7* 8.7*  < > 8.8*  < > 8.8* 8.6* 8.7* 8.8* 9.3  MG 2.8* 2.6* 2.6*  --  2.2  --   --  2.3  --   --  2.8*  PHOS 4.7*  --  4.7*  --  4.5  --   --  5.1*  --   --   --   < > = values in this interval not displayed. Liver Function Tests:  Recent Labs Lab 10/12/15 0500  AST 46*  ALT 81*  ALKPHOS 81  BILITOT 0.3  PROT 6.0*  ALBUMIN 2.1*   No results for input(s): LIPASE, AMYLASE in the last 168 hours. No results for input(s): AMMONIA in the last 168 hours. CBC:  Recent Labs Lab 10/11/15 0425 10/12/15 0500 10/13/15 0230 10/14/15 1305 10/15/15 0302  WBC 10.1 10.7* 16.4* 10.5 11.4*  NEUTROABS  --   --  13.2*  --   --   HGB 8.5* 8.4* 9.0* 9.2* 9.9*  HCT 27.4* 28.5* 30.8* 31.5* 34.0*  MCV 93.8 93.1 92.2 93.2 95.5  PLT 349 370 456* 355 362   Cardiac Enzymes:  Recent Labs Lab 10/10/15 0030 10/10/15 0400 10/10/15 1300  TROPONINI 0.04* 0.04* 0.03   BNP (last 3 results)  Recent Labs  10/04/15 2350  BNP 239.7*    ProBNP (last 3 results) No results for input(s): PROBNP in the last 8760 hours.  CBG:  Recent Labs Lab 10/15/15 0505 10/15/15 0847 10/15/15 1220 10/15/15 1635 10/15/15 2017  GLUCAP 126* 131* 127* 115* 109*    Recent Results (from the past 240 hour(s))  Culture, blood (Routine X 2) w Reflex to ID Panel     Status: None   Collection Time: 10/07/15 12:40 PM  Result Value Ref Range Status   Specimen  Description BLOOD LEFT HAND  Final   Special Requests BOTTLES DRAWN AEROBIC AND ANAEROBIC 5CC  Final   Culture NO GROWTH 5 DAYS  Final   Report Status 10/12/2015 FINAL  Final  Culture, blood (Routine X 2) w Reflex to ID Panel     Status: None   Collection Time: 10/07/15 12:46 PM  Result Value Ref Range Status   Specimen Description BLOOD LEFT ANTECUBITAL  Final   Special Requests BOTTLES DRAWN AEROBIC AND ANAEROBIC 5CC  Final   Culture NO GROWTH 5 DAYS  Final   Report Status 10/12/2015 FINAL  Final  Culture, blood (routine x 2)     Status: None (Preliminary result)   Collection Time: 10/14/15  1:44 AM  Result Value Ref Range Status   Specimen Description BLOOD LEFT HAND  Final   Special Requests BOTTLES DRAWN AEROBIC AND ANAEROBIC  Final   Culture  Setup Time   Final    GRAM POSITIVE COCCI IN CLUSTERS IN BOTH AEROBIC AND ANAEROBIC BOTTLES CRITICAL RESULT CALLED TO, READ BACK BY AND VERIFIED WITH: B REAP RN 2314 10/14/15 A BROWNING    Culture   Final    GRAM POSITIVE COCCI CULTURE REINCUBATED FOR BETTER GROWTH    Report Status PENDING  Incomplete  Culture, blood (routine x 2)     Status: None (Preliminary result)   Collection Time: 10/14/15  1:51 AM  Result Value Ref Range Status   Specimen Description BLOOD RIGHT HAND  Final   Special Requests BOTTLES DRAWN AEROBIC AND ANAEROBIC  Final   Culture NO GROWTH 1 DAY  Final   Report Status PENDING  Incomplete  Culture, Urine     Status: None   Collection Time: 10/14/15  1:32 PM  Result Value Ref Range Status   Specimen Description URINE, CATHETERIZED  Final   Special Requests NONE  Final   Culture NO GROWTH 1 DAY  Final   Report Status 10/15/2015 FINAL  Final  C difficile quick scan w PCR reflex     Status: None   Collection Time: 10/15/15  4:29 AM  Result Value Ref Range Status   C Diff antigen NEGATIVE NEGATIVE Final   C Diff toxin NEGATIVE NEGATIVE Final   C Diff interpretation Negative for toxigenic C. difficile   Final     Studies:  Recent x-ray studies have been reviewed in detail by the Attending Physician  Scheduled Meds:  Scheduled Meds: . amLODipine  10 mg Per Tube Daily  . antiseptic oral rinse  7 mL Mouth Rinse 10 times per day  . chlorhexidine gluconate  15 mL Mouth Rinse BID  . collagenase   Topical Daily  . [START ON 10/16/2015] free water  500 mL Per Tube Q3H  . heparin subcutaneous  5,000 Units Subcutaneous 3 times per day  . insulin aspart  0-9 Units Subcutaneous 6 times per day  . levofloxacin (LEVAQUIN) IV  750 mg Intravenous Q24H  . [START ON 10/16/2015] levothyroxine  100 mcg Per Tube QAC breakfast  . metoprolol tartrate  75 mg Per Tube BID  . oxcarbazepine  600 mg Per Tube BID  . pantoprazole sodium  40 mg Per Tube Daily  . sodium chloride  10-40 mL Intracatheter Q12H  . [START ON 10/16/2015] vancomycin  1,750 mg Intravenous Q24H  . ziprasidone  80 mg Oral BID WC    Time spent on care of this patient: 40 mins   Audri Kozub, Roselind Messier , MD  [facility] Office  [phone] Pager [phone]  On-Call/Text Page:      [email or web address]      password [email or web address]  If 7PM-7AM, please contact night-coverage [email or web address] Password [email or web address] 10/15/2015, 9:20 PM   LOS: 11 days   Care during the described time interval was provided by me .  I have reviewed this patient's available data, including medical history, events of note, physical examination, and all test results as part of my evaluation. I have personally reviewed and interpreted all radiology studies.   Carolyne Littles, MD [phone] Pager

## 2015-10-15 NOTE — Progress Notes (Signed)
CRITICAL VALUE ALERT  Critical value received:  Sodium 163  Date of notification:  10/15/2015   Time of notification:  5:13 AM   Critical value read back:Yes.    Nurse who received alert:  Reap, Randon Goldsmithebecca L   MD notified (1st page):  TRH  Time of first page:  5:13 AM   MD notified (2nd page):  Time of second page:  Responding MD:    Time MD responded:

## 2015-10-15 NOTE — Care Management Important Message (Signed)
Important Message  Patient Details  Name: Adrian DiegoCarlton B Polidore Jr. MRN: [id number] Date of Birth: Mar 16, 1961   Medicare Important Message Given:  Yes    Kyla BalzarineShealy, Kedron Uno Abena 10/15/2015, 12:43 PM

## 2015-10-15 NOTE — Progress Notes (Signed)
Speech Language Pathology Treatment: [name] Speaking valve  Patient Details Name: Adrian [name]. MRN: [id number] DOB: 08/04/1961 Today's Date: 10/15/2015 Time: [id number] SLP Time Calculation (min) (ACUTE ONLY): 42 min  Assessment / Plan / Recommendation Clinical Impression  Cuff deflated upon SLP arrival, and pt was voicing around his trach. PMSV placed by SLP with improved volume. Pt initially producing one word per breath, but with Mod cues from therapist for increased breath support, he was able to produce 3-4 words at a time. Although voicing is good, he does not develop enough subglottal pressure for an effective cough response despite Max cues from therapist, therefore no expectoration of secretions were observed. PSMV then remained in place throughout FEES, for a total wear time of ~30 minutes. No overt signs of distress or back pressure noted. Recommend to increase use of valve with full staff supervision.   HPI HPI: 54 yo PMH: morbid obesity (527 lbs), HTN, hyperlipidemia, DM, bipolar, OSA, drainage from left leg consistent [facility]ulitis [facility] where he presented with SOB. Found to have strep,with general decline and transported to [name]. Intubated 12/19, trach 12/23 vent, started trach collar 12/26.      SLP Plan  Continue with current plan of care     Recommendations  Diet recommendations: Dysphagia 2 (fine chop);Thin liquid Liquids provided via: Cup;Straw Medication Administration: Crushed with puree Supervision: Patient able to self feed;Full supervision/cueing for compensatory strategies Compensations: Minimize environmental distractions;Small sips/bites;Slow rate Postural Changes and/or Swallow Maneuvers: Upright 30-60 min after meal;Seated upright 90 degrees      Patient may use Passy-[name] Speech Valve: Intermittently with supervision;During all therapies with supervision;During PO intake/meals PMSV Supervision: Full MD: Please consider  changing trach tube to : Cuffless       Oral Care Recommendations: Oral care BID Follow up Recommendations: LTACH Plan: Continue with current plan of care   Adrian [name], M.A. CCC-SLP [phone]  Adrian [name], Adrian [name] 10/15/2015, 10:34 AM

## 2015-10-15 NOTE — Care Management Important Message (Signed)
Important Message  Patient Details  Name: Adrian DiegoCarlton B Dambach Jr. MRN: [id number] Date of Birth: May 25, 1961   Medicare Important Message Given:  Yes    Judithe Keetch, Chapman FitchHenrietta T, RN 10/15/2015, 10:28 AM

## 2015-10-15 NOTE — Procedures (Signed)
Objective Swallowing Evaluation: FEES-Fiberoptic Endoscopic Evaluation of Swallow  Patient Details  Name: Adrian [name]. MRN: [id number] Date of Birth: 02-11-1961  Today's Date: 10/15/2015 Time: SLP Start Time (ACUTE ONLY): 0940-SLP Stop Time (ACUTE ONLY): 1022 SLP Time Calculation (min) (ACUTE ONLY): 42 min  Past Medical History:  Past Medical History  Diagnosis Date  . Asthma   . Hypertension   . Hyperlipemia   . Diabetes mellitus without complication (HCC)   . Hypothyroidism   . Bipolar disorder (HCC)   . Sleep apnea, obstructive   . Depression    Past Surgical History:  Past Surgical History  Procedure Laterality Date  . Tonsillectomy    . Bone removed      bone removed in foot due to infection    HPI: 54 yo PMH: morbid obesity (527 lbs), HTN, hyperlipidemia, DM, bipolar, OSA, drainage from left leg consistent cellulitis admitted from  [facility] 12/16 where he presented with SOB. Found to have strep,with general decline and transported to [facility] ICU. Intubated 12/19, trach 12/23 vent, started trach collar 12/26.  Subjective: pt alert but confused, requires cueing  Assessment / Plan / Recommendation  CHL IP CLINICAL IMPRESSIONS 10/15/2015  Therapy Diagnosis Mild oral phase dysphagia;Mild pharyngeal phase dysphagia;Other (suspect esophageal component)   Clinical Impression Pt has a mild oropharyngeal dysphagia with suspected esophageal involvement as well, given small amounts of backflow into the pharynx with thin liquids. He has slow mastication and a delyaed swallow trigger with all consistencies tested. Trace to mild residue remains after the swallow with purees, which increases to moderate amounts of residue in the vallecuae with regular solids. Despite all of the above, no penetration or aspiration was observed. Recommend Dys 2 diet and thin liquids, to be consumed in an upright position. He does remain at an increased risk for aspiration given his deconditioned state,  ineffective cough, and difficulty with positioning due to body habitus. Will continue to follow for safety and advancement.   Impact on safety and function Moderate aspiration risk      CHL IP TREATMENT RECOMMENDATION 10/15/2015  Treatment Recommendations Therapy as outlined in treatment plan below     Prognosis 10/15/2015  Prognosis for Safe Diet Advancement Good  Barriers to Reach Goals --  Barriers/Prognosis Comment --    CHL IP DIET RECOMMENDATION 10/15/2015  SLP Diet Recommendations Dysphagia 2 (Fine chop) solids;Thin liquid  Liquid Administration via Cup;Straw  Medication Administration Crushed with puree  Compensations Minimize environmental distractions;Small sips/bites;Slow rate;Follow solids with liquid;Other (Comment)  Postural Changes Remain semi-upright after after feeds/meals (Comment);Seated upright at 90 degrees      CHL IP OTHER RECOMMENDATIONS 10/15/2015  Recommended Consults --  Oral Care Recommendations Oral care BID  Other Recommendations --      CHL IP FOLLOW UP RECOMMENDATIONS 10/15/2015  Follow up Recommendations LTACH      CHL IP FREQUENCY AND DURATION 10/15/2015  Speech Therapy Frequency (ACUTE ONLY) min 2x/week  Treatment Duration 2 weeks           CHL IP ORAL PHASE 10/15/2015  Oral Phase Impaired  Oral - Pudding Teaspoon --  Oral - Pudding Cup --  Oral - Honey Teaspoon --  Oral - Honey Cup --  Oral - Nectar Teaspoon --  Oral - Nectar Cup --  Oral - Nectar Straw --  Oral - Thin Teaspoon WFL  Oral - Thin Cup WFL  Oral - Thin Straw WFL  Oral - Puree WFL  Oral - Mech Soft --  Oral -  Regular Impaired mastication  Oral - Multi-Consistency --  Oral - Pill --  Oral Phase - Comment --    CHL IP PHARYNGEAL PHASE 10/15/2015  Pharyngeal Phase Impaired  Pharyngeal- Pudding Teaspoon --  Pharyngeal --  Pharyngeal- Pudding Cup --  Pharyngeal --  Pharyngeal- Honey Teaspoon --  Pharyngeal --  Pharyngeal- Honey Cup --  Pharyngeal --   Pharyngeal- Nectar Teaspoon --  Pharyngeal --  Pharyngeal- Nectar Cup --  Pharyngeal --  Pharyngeal- Nectar Straw --  Pharyngeal --  Pharyngeal- Thin Teaspoon Delayed swallow initiation-pyriform sinuses;Reduced anterior laryngeal mobility;Reduced laryngeal elevation;Reduced tongue base retraction  Pharyngeal --  Pharyngeal- Thin Cup Delayed swallow initiation-pyriform sinuses;Reduced anterior laryngeal mobility;Reduced laryngeal elevation;Reduced tongue base retraction  Pharyngeal --  Pharyngeal- Thin Straw Delayed swallow initiation-pyriform sinuses;Reduced anterior laryngeal mobility;Reduced laryngeal elevation;Reduced tongue base retraction  Pharyngeal --  Pharyngeal- Puree Reduced anterior laryngeal mobility;Reduced laryngeal elevation;Reduced tongue base retraction;Delayed swallow initiation-vallecula;Pharyngeal residue - valleculae  Pharyngeal --  Pharyngeal- Mechanical Soft --  Pharyngeal --  Pharyngeal- Regular Reduced anterior laryngeal mobility;Reduced laryngeal elevation;Reduced tongue base retraction;Delayed swallow initiation-vallecula;Pharyngeal residue - valleculae  Pharyngeal --  Pharyngeal- Multi-consistency --  Pharyngeal --  Pharyngeal- Pill --  Pharyngeal --  Pharyngeal Comment --     CHL IP CERVICAL ESOPHAGEAL PHASE 10/15/2015  Cervical Esophageal Phase Impaired  Pudding Teaspoon --  Pudding Cup --  Honey Teaspoon --  Honey Cup --  Nectar Teaspoon --  Nectar Cup --  Nectar Straw --  Thin Teaspoon --  Thin Cup --  Thin Straw Esophageal backflow into the pharynx  Puree --  Mechanical Soft --  Regular --  Multi-consistency --  Pill --  Cervical Esophageal Comment --    Maxcine HamLaura Paiewonsky, M.A. CCC-SLP [phone]  Maxcine Hamaiewonsky, Nashawn Hillock 10/15/2015, 10:48 AM

## 2015-10-15 NOTE — Progress Notes (Signed)
Nutrition Follow-up  DOCUMENTATION CODES:   Morbid obesity  INTERVENTION:    D/C Vital High Protein formula    D/C Prostat liquid protein  Initiate Vital AF 1.2 formula at 25 ml/hr and increase by 10 ml every 4 hours to goal rate of 75 ml/hr  TF regimen to provide 2160 kcals, 135 gm protein, 1459 ml of free water  NUTRITION DIAGNOSIS:   Inadequate oral intake related to inability to eat as evidenced by NPO status, ongoing  GOAL:   Patient will meet greater than or equal to 90% of their needs,  MONITOR:   TF tolerance, Labs, Weight trends, Skin, I & O's  ASSESSMENT:   54 year old, 514 lb male with panus and leg infections, sepsis. Hypoxic resp failure due to ALI. Intubated 12/18.   Patient s/p procedure 12/23: BEDSIDE TRACHEOSTOMY  [facility] 2C-Stepdown 12/27.  Patient currently on ATC.  Current TF regimen: Vital High Protein formula at 60 ml/hr with Prostat liquid protein 60 ml BID via CORTRAK small bore feeding tube -- providing 1840 kcals, 186 gm protein, 1203 ml of water.  Additional free water flushes at 400 ml every 3 hours.   Now that patient off ventilator support, RD to adjust TF regimen.  Speech Path following for PMSV.  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  Estimated Nutritional Needs:   Kcal:  2000-2200  Protein:  125-135 gm  Fluid:  2.0-2.2 L  EDUCATION NEEDS:   No education needs identified at this time  Maureen ChattersKatie Kamaljit Hizer, RD, LDN Pager #: [phone] After-Hours Pager #: [phone]

## 2015-10-15 NOTE — Progress Notes (Signed)
PULMONARY / CRITICAL CARE MEDICINE   Name: Adrian [name]. MRN: [id number] DOB: [birth year]    ADMISSION DATE:  10/04/2015  REFERRING MD:  [facility]  CHIEF COMPLAINT: SOB  HISTORY OF PRESENT ILLNESS:   54 yo who presented  to [facility] 12/16 with SOB, he is 527 lbs and has OSA, also has drainage from left leg and panus consistent with cellulitis.  SUBJECTIVE:  Fever resolved. Tolerating TC. No acute issues overnight. Blood cultures positive for gram positive cocci in clusters. Vanco started  VITAL SIGNS: BP 127/66 mmHg  Pulse 100  Temp(Src) 98.2 F (36.8 C) (Core (Comment))  Resp 20  Ht  (1.803 m)  Wt 185 kg (407 lb 13.6 oz)  BMI 56.91 kg/m2  SpO2 96%  HEMODYNAMICS:    VENTILATOR SETTINGS: Vent Mode:  [-]  FiO2 (%):  [28 %-40 %] 28 %  INTAKE / OUTPUT: I/O last 3 completed shifts: In: 6220 [NG/GT:5720; IV Piggyback:500] Out: 6050 [Urine:6050]  PHYSICAL EXAMINATION:  Gen: Morbidly obese, awake, no distress Lungs: Bilateral airflow; no wheezing CV: RRR, distant, no clear murmur GI: Obese, soft, nontender, BS+ Neuro: awake and alert, answering questions and following commands Skin: massive panus, chronic appearing wound well dressed in panus, no drainage, fungal appearing erythema under panus in fold   LABS:  BMET  Recent Labs Lab 10/13/15 0230 10/14/15 0144 10/15/15 0302  NA 155* 156* 163*  K 4.2 4.5 4.1  CL 113* 112* 117*  CO2 29 29 32  BUN 76* 65* 59*  CREATININE 1.66* 1.72* 1.44*  GLUCOSE 131* 138* 128*   Electrolytes  Recent Labs Lab 10/10/15 0429  10/11/15 0425  10/12/15 0500 10/13/15 0230 10/14/15 0144 10/15/15 0302  CALCIUM 8.7*  < > 8.8*  < > 8.6* 8.7* 8.8* 9.3  MG 2.6*  --  2.2  --  2.3  --   --  2.8*  PHOS 4.7*  --  4.5  --  5.1*  --   --   --   < > = values in this interval not displayed. CBC  Recent Labs Lab 10/13/15 0230 10/14/15 1305 10/15/15 0302  WBC 16.4* 10.5 11.4*  HGB 9.0* 9.2* 9.9*  HCT 30.8* 31.5* 34.0*  PLT  456* 355 362   Coag's  Recent Labs Lab 10/09/15 0448  APTT 32  INR 1.40   Sepsis Markers No results for input(s): LATICACIDVEN, PROCALCITON, O2SATVEN in the last 168 hours. ABG  Recent Labs Lab 10/09/15 0259 10/10/15 0350  PHART 7.388 7.363  PCO2ART 55.9* 52.8*  PO2ART 80.0 118*   Liver Enzymes  Recent Labs Lab 10/12/15 0500  AST 46*  ALT 81*  ALKPHOS 81  BILITOT 0.3  ALBUMIN 2.1*   Cardiac Enzymes  Recent Labs Lab 10/10/15 0030 10/10/15 0400 10/10/15 1300  TROPONINI 0.04* 0.04* 0.03   Glucose  Recent Labs Lab 10/14/15 0816 10/14/15 1245 10/14/15 1655 10/14/15 2050 10/15/15 0010 10/15/15 0505  GLUCAP 129* 114* 121* 126* 134* 126*   Imaging No results found. STUDIES:  12/23 TEE with no vegetations  CULTURES: 12/16 bc [facility]) >> Strep group g 2 of 2>>levaquin sensitive 12/16 [facility]) >> multiple species 12/18 urine >> negative 12/19 Resp >> moderate candida 12/18 Blood >> Coag negative staph  12/21 Blood >> No growth 12/28>>Blood>gram + cocci in clusters 12/28>>Urine>negative to date 12/28>>C-diff>negative  ANTIBIOTICS: Ceftriaxone 12/17 >> 12/19 Vanco 12/18 >>12/22 Clinda 12/19 >> 12/19 Zosyn 12/19 >>12/22 Levaquin 12/22>>> NGTD Vancomycin 12/29>>  SIGNIFICANT EVENTS: 12/16 admit to [facility] with  panus infection 12/18 [facility] 12/23 Tracheostomy 12/25 off vent during daytime 12/27 off vent>TC 12/28 fever>+ blood cultures  LINES/TUBES: ETT 12/18 >>12/23 Tracheostomy Shiley 6 mm Cuffed (JY) 12/23>>> L IJ TLC 12/20>>> 12/26  DISCUSSION: Morbidly obese male with panus and leg infections, sepsis, and hypoxic respiratory failure due to ALI superimposed on OHS +/- sedating meds. Intubated 12/18; tracheostomy 12/23, now with improving oxygenation, remains off vent and tolerating TC. Fever 12/28 with positive blood cultures.   ASSESSMENT / PLAN:  PULMONARY A:  ARDS-continuing to improve ?HCAP>  resolved Hx of asthma  Acute pulmonary edema with total body volume overload Obstructive sleep apnea> resolved with trach Obesity hypoventilation syndrome > currently doing well with out vent support P:   Continue PMV trials FEES 12/29 Aspiration precautions Will likely downsize trach at 7 day trach change to cuffless, but do not plan to de-cannulate anytime soon Wean O2 for O2 saturation > 92% Pulmonary hygiene  INFECTIOUS A:   Cellulitis of panus with chronic wound Group G strep bacteremia, TEE negative for vegetations HCAP / aspiration PNA; CXR 12/28 without  infiltrates Fever and leukocytosis; C-Diff negative; urine culture negative; blood cultures with gram positive cocci in clusters P:   Continue levaquin as ordered > plan 14 days given severity of illness Appreciate WOC consult > dressing changes and other intervention per their recs F/U blood and urine cultures Vancomycin started 12/29 for GPC in 1/2 blood cultures Trend WBC  Rest of treatment plan per [facility] PCCM to follow for respiratory status/trach  FAMILY  - Updates: No family bedside 12/28   - Inter-disciplinary family meet or Palliative Care meeting due by:  12/30  [facility] ANP-BC Pulmonary and Critical Care Medicine [facility] Pager: [phone]

## 2015-10-16 ENCOUNTER — Encounter: Payer: Self-pay | Admitting: Pulmonary Disease

## 2015-10-16 DIAGNOSIS — B371 Pulmonary candidiasis: Secondary | ICD-10-CM

## 2015-10-16 LAB — COMPREHENSIVE METABOLIC PANEL
ALBUMIN: 2.6 g/dL — AB (ref 3.5–5.0)
ALK PHOS: 74 U/L (ref 38–126)
ALT: 149 U/L — ABNORMAL HIGH (ref 17–63)
ANION GAP: 13 (ref 5–15)
AST: 75 U/L — ABNORMAL HIGH (ref 15–41)
BUN: 51 mg/dL — ABNORMAL HIGH (ref 6–20)
CALCIUM: 8.5 mg/dL — AB (ref 8.9–10.3)
CHLORIDE: 111 mmol/L (ref 101–111)
CO2: 30 mmol/L (ref 22–32)
Creatinine, Ser: 1.64 mg/dL — ABNORMAL HIGH (ref 0.61–1.24)
GFR calc Af Amer: 53 mL/min — ABNORMAL LOW (ref >60)
GFR calc non Af Amer: 46 mL/min — ABNORMAL LOW (ref >60)
GLUCOSE: 131 mg/dL — AB (ref 65–99)
POTASSIUM: 3.8 mmol/L (ref 3.5–5.1)
SODIUM: 154 mmol/L — AB (ref 135–145)
Total Bilirubin: 0.5 mg/dL (ref 0.3–1.2)
Total Protein: 6.4 g/dL — ABNORMAL LOW (ref 6.5–8.1)

## 2015-10-16 LAB — CBC WITH DIFFERENTIAL/PLATELET
BASOS ABS: 0 10*3/uL (ref 0.0–0.1)
BASOS PCT: 0 %
EOS ABS: 0.1 10*3/uL (ref 0.0–0.7)
Eosinophils Relative: 1 %
HEMATOCRIT: 31.3 % — AB (ref 39.0–52.0)
Hemoglobin: 9.2 g/dL — ABNORMAL LOW (ref 13.0–17.0)
Lymphocytes Relative: 23 %
Lymphs Abs: 2.2 10*3/uL (ref 0.7–4.0)
MCH: 27.5 pg (ref 26.0–34.0)
MCHC: 29.4 g/dL — ABNORMAL LOW (ref 30.0–36.0)
MCV: 93.7 fL (ref 78.0–100.0)
MONO ABS: 0.4 10*3/uL (ref 0.1–1.0)
Monocytes Relative: 4 %
NEUTROS ABS: 7.1 10*3/uL (ref 1.7–7.7)
NEUTROS PCT: 72 %
Platelets: 355 10*3/uL (ref 150–400)
RBC: 3.34 MIL/uL — ABNORMAL LOW (ref 4.22–5.81)
RDW: 18 % — AB (ref 11.5–15.5)
WBC: 9.9 10*3/uL (ref 4.0–10.5)

## 2015-10-16 LAB — GLUCOSE, CAPILLARY
GLUCOSE-CAPILLARY: 123 mg/dL — AB (ref 65–99)
GLUCOSE-CAPILLARY: 111 mg/dL — AB (ref 65–99)
GLUCOSE-CAPILLARY: 121 mg/dL — AB (ref 65–99)
GLUCOSE-CAPILLARY: 122 mg/dL — AB (ref 65–99)
GLUCOSE-CAPILLARY: 116 mg/dL — AB (ref 65–99)
Glucose-Capillary: 136 mg/dL — ABNORMAL HIGH (ref 65–99)

## 2015-10-16 LAB — MAGNESIUM: Magnesium: 2.3 mg/dL (ref 1.7–2.4)

## 2015-10-16 MED ORDER — METOPROLOL TARTRATE 100 MG PO TABS
100.0000 mg | ORAL_TABLET | Freq: Two times a day (BID) | ORAL | Status: DC
  Administered 2015-10-16 – 2015-10-20 (×8): 100 mg via ORAL
  Filled 2015-10-16 (×8): qty 1

## 2015-10-16 MED ORDER — VITAL AF 1.2 CAL PO LIQD
1000.0000 mL | ORAL | Status: DC
  Filled 2015-10-16 (×4): qty 1000

## 2015-10-16 MED ORDER — FLUCONAZOLE IN SODIUM CHLORIDE 200-0.9 MG/100ML-% IV SOLN
200.0000 mg | INTRAVENOUS | Status: DC
  Administered 2015-10-16 – 2015-10-19 (×4): 200 mg via INTRAVENOUS
  Filled 2015-10-16 (×4): qty 100

## 2015-10-16 MED ORDER — METOPROLOL TARTRATE 25 MG PO TABS: 75.0000 mg | ORAL_TABLET | Freq: Two times a day (BID) | ORAL | Status: DC

## 2015-10-16 NOTE — Progress Notes (Signed)
[place] TEAM 1 - Stepdown/ICU TEAM Progress Note  Lieutenant Diego. ZOX:[id number] DOB: 1960-12-21 DOA: 10/04/2015 PCP: No PCP Per Patient  Admit HPI / Brief Narrative: 54 yo WM PMHx MORBIDLY obese (527 pounds), OSA/OHS,  [facility] with SOB, drainage from left leg and panus consistent with cellulitis.   HPI/Subjective: A/O 4, NAD. Not interested in treatment plan. Patient appears to be totally helpless (i.e. won't even use remote to change channels on television) unless food is involved at which point patient is able to move extremities as required.   Assessment/Plan: Cellulitis of panus with chronic wound (abdominal wall cellulitis)/ Group G strep bacteremia -TEE negative for vegetations - current plan is to complete 14 days of levaquin  -Tmax overnight 38.6 C -Pan culture pending   ARDS/positive moderate Candida Albicans PNA continuing to improve - PCCM following trach/pulm issues for now  -12/29 trach changed to a  #6 cuffless  -Diflucan IV 200 mg daily 14 days  Dysphagia -Passed swallow study;Dysphagia 2 (fine chop);Thin liquid -Have requested nutrition help with a car modified 2000-calorie/day diet  Hx of asthma  -No evidence of wheezing today   Acute pulmonary edema with total body volume overload -Strict in and out; since admission -17.8 L  -Daily weights; admission weight= 233.1 kg            12/30 weight= 202 kg  Hypernatremia -Continue free water via feeding tube to 500 ml q 3 hr  -Continue D5W 71ml/hr  Obstructive sleep apnea / Obesity hypoventilation Syndrome -Now on trach collar with blow-by    HTN -Continue amlodipine 10 mg daily  -Increase Metoprolol 100 mg  BID   Super morbid obesity - Body mass index is 61.52 kg/(m^2).  -12/30 Start Calorie count -Consult nutrition for 2000-calorie/day diabetic diet  Bipolar D/O  DM Type 2 -12/16 hemoglobin A1c= 5.9 -Fairly well controlled at this time   Hypothyroid -12/16 TSH =4.5    -Increase Synthroid to 100 g daily      Code Status: FULL Family Communication: no family present at time of exam Disposition Plan: SNF    Consultants: [facility]   Procedure/Significant Events: 12/16 admit to [facility] with panus infection 12/18 Transfer to [facility] due to no ICU nurses at [facility] 12/23 Tracheostomy 12/23 TEE - normal LV fxn - no vegetation 12/25 off vent during daytime 12/27 off vent > TC   Culture 12/16 blood right chest/AC positive group G strep 12/18 MRSA by PCR positive 12/19 blood right hand negative final 12/19 tracheal aspirate positive moderate Candida Albicans 12/21 blood left hand/ AC negative final 12/28 blood left hand positive coag negative staph 12/28 blood right hand NGTD 12/28 urine NGTD   Antibiotics: Rocephin 12/18>>12/19 Zosyn 12/19>>12/22 Levofloxacin 12/22>>12/31 Vancomycin 12/201 dose : 12/29>>12/30   Diflucan 12/30>>  DVT prophylaxis: Heparin subcutaneous   Devices    LINES / TUBES:      Continuous Infusions: . sodium chloride Stopped (10/11/15 0300)  . dextrose 75 mL/hr at 10/15/15 2201    Objective: VITAL SIGNS: Temp: 98.8 F (37.1 C) (12/30 1632) Temp Source: Oral (12/30 1632) BP: 151/68 mmHg (12/30 1632) Pulse Rate: 106 (12/30 1632) SPO2; FIO2:   Intake/Output Summary (Last 24 hours) at 10/16/15 1844 Last data filed at 10/16/15 1700  Gross per 24 hour  Intake 4653.75 ml  Output   5651 ml  Net -997.25 ml     Exam: General: A/O 4, patient only interested in watching television, positive acute respiratory distress (  now on tracheostomy) Eyes: Negative headache, negative scleral hemorrhage ENT: Negative Runny nose, negative ear pain, negative gingival bleeding Neck:  Negative scars, masses, torticollis, lymphadenopathy, JVD, tracheostomy present with thick yellow copious discharge. Lungs: Clear to auscultation bilaterally without wheezes or crackles (extremely difficult to hear  secondary to MORBID OBESITY) Cardiovascular: Regular rate and rhythm without murmur gallop or rub normal S1 and S2 (extremely difficult to hear secondary to MORBID OBESITY) Abdomen: negative abdominal pain, nondistended, positive soft, bowel sounds, no rebound, no ascites, no appreciable mass. Midline vertical grade 2 ulceration of his pannus; good granulation tissue, negative discharge.  Extremities: No significant cyanosis, clubbing, or edema bilateral lower extremities Psychiatric:  Negative depression, negative anxiety, negative fatigue, negative mania  Neurologic:  Cranial nerves II through XII intact, tongue/uvula midline, all extremities muscle strength 5/5, sensation intact throughout, negative dysarthria, negative expressive aphasia, negative receptive aphasia.   Data Reviewed: Basic Metabolic Panel:  Recent Labs Lab 10/10/15 0429  10/11/15 0425  10/12/15 0500 10/13/15 0230 10/14/15 0144 10/15/15 0302 10/16/15 1235  NA 156*  < > 155*  < > 155* 155* 156* 163* 154*  K 4.0  < > 3.6  < > 4.5 4.2 4.5 4.1 3.8  CL 114*  < > 114*  < > 113* 113* 112* 117* 111  CO2 32  < > 30  < > 31 29 29  32 30  GLUCOSE 130*  < > 151*  < > 156* 131* 138* 128* 131*  BUN 73*  < > 70*  < > 75* 76* 65* 59* 51*  CREATININE 1.92*  < > 1.71*  < > 1.69* 1.66* 1.72* 1.44* 1.64*  CALCIUM 8.7*  < > 8.8*  < > 8.6* 8.7* 8.8* 9.3 8.5*  MG 2.6*  --  2.2  --  2.3  --   --  2.8* 2.3  PHOS 4.7*  --  4.5  --  5.1*  --   --   --   --   < > = values in this interval not displayed. Liver Function Tests:  Recent Labs Lab 10/12/15 0500 10/16/15 1235  AST 46* 75*  ALT 81* 149*  ALKPHOS 81 74  BILITOT 0.3 0.5  PROT 6.0* 6.4*  ALBUMIN 2.1* 2.6*   No results for input(s): LIPASE, AMYLASE in the last 168 hours. No results for input(s): AMMONIA in the last 168 hours. CBC:  Recent Labs Lab 10/12/15 0500 10/13/15 0230 10/14/15 1305 10/15/15 0302 10/16/15 0820  WBC 10.7* 16.4* 10.5 11.4* 9.9  NEUTROABS  --   13.2*  --   --  7.1  HGB 8.4* 9.0* 9.2* 9.9* 9.2*  HCT 28.5* 30.8* 31.5* 34.0* 31.3*  MCV 93.1 92.2 93.2 95.5 93.7  PLT 370 456* 355 362 355   Cardiac Enzymes:  Recent Labs Lab 10/10/15 0030 10/10/15 0400 10/10/15 1300  TROPONINI 0.04* 0.04* 0.03   BNP (last 3 results)  Recent Labs  10/04/15 2350  BNP 239.7*    ProBNP (last 3 results) No results for input(s): PROBNP in the last 8760 hours.  CBG:  Recent Labs Lab 10/16/15 0130 10/16/15 0349 10/16/15 0812 10/16/15 1214 10/16/15 1632  GLUCAP 123* 111* 121* 122* 116*    Recent Results (from the past 240 hour(s))  Culture, blood (Routine X 2) w Reflex to ID Panel     Status: None   Collection Time: 10/07/15 12:40 PM  Result Value Ref Range Status   Specimen Description BLOOD LEFT HAND  Final   Special Requests BOTTLES  DRAWN AEROBIC AND ANAEROBIC 5CC  Final   Culture NO GROWTH 5 DAYS  Final   Report Status 10/12/2015 FINAL  Final  Culture, blood (Routine X 2) w Reflex to ID Panel     Status: None   Collection Time: 10/07/15 12:46 PM  Result Value Ref Range Status   Specimen Description BLOOD LEFT ANTECUBITAL  Final   Special Requests BOTTLES DRAWN AEROBIC AND ANAEROBIC 5CC  Final   Culture NO GROWTH 5 DAYS  Final   Report Status 10/12/2015 FINAL  Final  Culture, blood (routine x 2)     Status: None (Preliminary result)   Collection Time: 10/14/15  1:44 AM  Result Value Ref Range Status   Specimen Description BLOOD LEFT HAND  Final   Special Requests BOTTLES DRAWN AEROBIC AND ANAEROBIC  Final   Culture  Setup Time   Final    GRAM POSITIVE COCCI IN CLUSTERS IN BOTH AEROBIC AND ANAEROBIC BOTTLES CRITICAL RESULT CALLED TO, READ BACK BY AND VERIFIED WITH: B REAP RN 2314 10/14/15 A BROWNING    Culture   Final    STAPHYLOCOCCUS SPECIES (COAGULASE NEGATIVE) THE SIGNIFICANCE OF ISOLATING THIS ORGANISM FROM A SINGLE SET OF BLOOD CULTURES WHEN MULTIPLE SETS ARE DRAWN IS UNCERTAIN. PLEASE NOTIFY THE MICROBIOLOGY  DEPARTMENT WITHIN ONE WEEK IF SPECIATION AND SENSITIVITIES ARE REQUIRED.    Report Status PENDING  Incomplete  Culture, blood (routine x 2)     Status: None (Preliminary result)   Collection Time: 10/14/15  1:51 AM  Result Value Ref Range Status   Specimen Description BLOOD RIGHT HAND  Final   Special Requests BOTTLES DRAWN AEROBIC AND ANAEROBIC  Final   Culture NO GROWTH 2 DAYS  Final   Report Status PENDING  Incomplete  Culture, Urine     Status: None   Collection Time: 10/14/15  1:32 PM  Result Value Ref Range Status   Specimen Description URINE, CATHETERIZED  Final   Special Requests NONE  Final   Culture NO GROWTH 1 DAY  Final   Report Status 10/15/2015 FINAL  Final  C difficile quick scan w PCR reflex     Status: None   Collection Time: 10/15/15  4:29 AM  Result Value Ref Range Status   C Diff antigen NEGATIVE NEGATIVE Final   C Diff toxin NEGATIVE NEGATIVE Final   C Diff interpretation Negative for toxigenic C. difficile  Final     Studies:  Recent x-ray studies have been reviewed in detail by the Attending Physician  Scheduled Meds:  Scheduled Meds: . amLODipine  10 mg Per Tube Daily  . antiseptic oral rinse  7 mL Mouth Rinse 10 times per day  . chlorhexidine gluconate  15 mL Mouth Rinse BID  . collagenase   Topical Daily  . [START ON 10/17/2015] feeding supplement (VITAL AF 1.2 CAL)  1,000 mL Per Tube Q24H  . fluconazole (DIFLUCAN) IV  200 mg Intravenous Q24H  . free water  500 mL Per Tube Q3H  . heparin subcutaneous  5,000 Units Subcutaneous 3 times per day  . insulin aspart  0-9 Units Subcutaneous 6 times per day  . levofloxacin (LEVAQUIN) IV  750 mg Intravenous Q24H  . levothyroxine  100 mcg Per Tube QAC breakfast  . metoprolol tartrate  100 mg Oral BID  . oxcarbazepine  600 mg Per Tube BID  . pantoprazole sodium  40 mg Per Tube Daily  . sodium chloride  10-40 mL Intracatheter Q12H  . ziprasidone  80  mg Oral BID WC    Time spent on care of this  patient: 40 mins   WOODS, Roselind MessierURTIS J , MD  [facility] Office  [phone] Pager [phone]  On-Call/Text Page:      [email or web address]      password [email or web address]  If 7PM-7AM, please contact night-coverage [email or web address] Password [email or web address] 10/16/2015, 6:44 PM   LOS: 12 days   Care during the described time interval was provided by me .  I have reviewed this patient's available data, including medical history, events of note, physical examination, and all test results as part of my evaluation. I have personally reviewed and interpreted all radiology studies.   Carolyne Littlesurtis Woods, MD [phone] Pager

## 2015-10-16 NOTE — Progress Notes (Signed)
PULMONARY / CRITICAL CARE MEDICINE   Name: Adrian DiegoCarlton B Gerstenberger Jr. MRN: [id number] DOB: 01/12/1961    ADMISSION DATE:  10/04/2015  [facility]  [facility]  CHIEF COMPLAINT: SOB  HISTORY OF PRESENT ILLNESS:   54 y/[facility] who [facility], he is 474 lbs and has OSA, also has drainage from left leg and panus consistent with cellulitis.  SUBJECTIVE:  Pt tolerating ATC, tmax 99.1, net negative 2300 in last 24 hours  VITAL SIGNS: BP 147/75 mmHg  Pulse 92  Temp(Src) 99.1 F (37.3 C) (Oral)  Resp 27  Ht 5\' 11"  (1.803 m)  Wt 445 lb 5.3 oz (202 kg)  BMI 62.14 kg/m2  SpO2 95%  HEMODYNAMICS:    VENTILATOR SETTINGS: Vent Mode:  [-]  FiO2 (%):  [28 %] 28 %  INTAKE / OUTPUT: I/O last 3 completed shifts: In: 6143.8 [I.V.:598.8; MW/UX:3244G/GT:4545; IV Piggyback:1000] Out: 7300 [Urine:7300]  PHYSICAL EXAMINATION:  Gen: Morbidly obese male lying in bed, awake, no distress Lungs: even/non-labored, lungs bilaterally with good air movement, no wheezing  CV: RRR, distant, no clear murmur GI: Obese, soft, nontender, BS+ Neuro: awake and alert, answering questions and following commands Skin: massive panus, chronic appearing wound well dressed in panus, no drainage, fungal appearing erythema under panus in fold   LABS:  BMET  Recent Labs Lab 10/13/15 0230 10/14/15 0144 10/15/15 0302  NA 155* 156* 163*  K 4.2 4.5 4.1  CL 113* 112* 117*  CO2 29 29 32  BUN 76* 65* 59*  CREATININE 1.66* 1.72* 1.44*  GLUCOSE 131* 138* 128*   Electrolytes  Recent Labs Lab 10/10/15 0429  10/11/15 0425  10/12/15 0500 10/13/15 0230 10/14/15 0144 10/15/15 0302  CALCIUM 8.7*  < > 8.8*  < > 8.6* 8.7* 8.8* 9.3  MG 2.6*  --  2.2  --  2.3  --   --  2.8*  PHOS 4.7*  --  4.5  --  5.1*  --   --   --   < > = values in this interval not displayed.   CBC  Recent Labs Lab 10/14/15 1305 10/15/15 0302 10/16/15 0820  WBC 10.5 11.4* 9.9  HGB 9.2* 9.9* 9.2*  HCT 31.5* 34.0* 31.3*  PLT 355 362  355   Coag's No results for input(s): APTT, INR in the last 168 hours.   Sepsis Markers No results for input(s): LATICACIDVEN, PROCALCITON, O2SATVEN in the last 168 hours.   ABG  Recent Labs Lab 10/10/15 0350  PHART 7.363  PCO2ART 52.8*  PO2ART 118*   Liver Enzymes  Recent Labs Lab 10/12/15 0500  AST 46*  ALT 81*  ALKPHOS 81  BILITOT 0.3  ALBUMIN 2.1*   Cardiac Enzymes  Recent Labs Lab 10/10/15 0030 10/10/15 0400 10/10/15 1300  TROPONINI 0.04* 0.04* 0.03   Glucose  Recent Labs Lab 10/15/15 1220 10/15/15 1635 10/15/15 2017 10/16/15 0130 10/16/15 0349 10/16/15 0812  GLUCAP 127* 115* 109* 123* 111* 121*   Imaging No results found.   STUDIES:  12/23 TEE with no vegetations  CULTURES: 12/16  [facility] >> Strep group g 2 of 2 >> levaquin sensitive 12/16  [facility]) >> multiple species 12/18  urine >> negative 12/19  Resp >> moderate candida 12/18  Blood >> Coag negative staph  12/21  Blood >> neg 12/28  Blood >> gram + cocci in clusters >> 12/28  Urine >> negative 12/28  C-diff >> negative  ANTIBIOTICS: Ceftriaxone 12/17 >> 12/19 Vanco 12/18 >>12/22  Clinda 12/19 >> 12/19 Zosyn 12/19 >>12/22 Levaquin 12/22 >>   Vancomycin 12/29 >>  SIGNIFICANT EVENTS: 12/16  admit to [facility] with panus infection 12/18  [facility] due to no [facility] at [facility] 12/23  Tracheostomy 12/25  off vent during daytime 12/27  off vent>TC 12/28  Fever, + blood cultures 12/29  FEES >> mild oral phase dysphagia, mild pharyngeal phase dysphagia, moderate aspiration risk  LINES/TUBES: ETT 12/18 >> 12/23 Tracheostomy Shiley 6 mm Cuffed (JY) 12/23 >> L IJ TLC 12/20 >> 12/26  DISCUSSION: Morbidly obese male with panus and leg infections, sepsis, and hypoxic respiratory failure due to ALI superimposed on OHS +/- sedating meds. Intubated 12/18; tracheostomy 12/23, now with improving oxygenation, remains off vent and tolerating TC. Fever 12/28 with positive blood  cultures.   ASSESSMENT / PLAN:  PULMONARY A:  ARDS - resolving, progressed to ATC   ?HCAP - resolved Hx of asthma  Acute pulmonary edema with total body volume overload Obstructive sleep apnea - resolved with trach Obesity hypoventilation syndrome - currently doing well with out vent support P:   Continue PMV trials SLP following Aspiration precautions Plan for downsize trach to #6 cuffless, but do not plan to de-cannulate anytime soon Wean O2 for O2 saturation > 92% Pulmonary hygiene Mobilize, push PT  INFECTIOUS A:   Cellulitis of panus with chronic wound Group G strep bacteremia, TEE negative for vegetations HCAP / aspiration PNA; CXR 12/28 without  infiltrates Fever and leukocytosis; C-Diff negative; urine culture negative; blood cultures with gram positive cocci in clusters P:   Continue levaquin, D9/14.  Plan for 14 days given severity of illness. See abx / cultures as above Appreciate WOC consult  F/U blood and urine cultures Vancomycin started 12/29 for GPC in 1/2 blood cultures Trend WBC / fever curve     Rest of treatment plan per [facility] PCCM to follow for respiratory status/trach   FAMILY  - Updates: No family bedside 12/28    Canary Brim, NP-C [facility] Pgr: [phone] or if no answer [phone] 10/16/2015, 9:58 AM  Attending:  I have seen and examined the patient with nurse practitioner/resident and agree with the note above.  My edits are in BOLD above.  We formulated the plan together and I elicited the following history.    Adrian [name] continues to do well off of the ventilator He does not have any complaints this morning  His recent fever was noted, GPC bacteremia noted, hopefully this is just a contaminant  On exam he is awake, alert, breathing comfortably, soft belly, chronic appearing edema  Tracheostomy: We will change today to a #6 cuffless Fever: Per [facility]; continue wound care  Disposition: Recommend  transfer to long-term acute care facility  Heber [place], MD [place] Pager: [phone] Cell: [phone] After 3pm or if no response, call [phone]

## 2015-10-16 NOTE — Progress Notes (Signed)
Speech Language Pathology Treatment: Dysphagia;Passy Muir Speaking valve  Patient Details Name: Adrian DiegoCarlton B Ellena Jr. MRN: [id number] DOB: 12-18-1960 Today's Date: 10/16/2015 Time: [id number] SLP Time Calculation (min) (ACUTE ONLY): 14 min  Assessment / Plan / Recommendation Clinical Impression  Pt wearing PMSV with RN upon SLP arrival. VS remains stable, no apparent signs of distress noted, and vocal quality was good. SLP provided skilled observation and Mod cues for pacing with cup sips of thin liquids and bites of puree. Immediate cough noted x1 following trial of thin liquid. SLP provided prompting for stronger cough, to which pt replied, "I think I'll be okay." SLP reviewed signs of aspiration and subsequent risks. Will continue to monitor.   HPI HPI: 54 yo PMH: morbid obesity (527 lbs), HTN, hyperlipidemia, DM, bipolar, OSA, drainage from left leg consistent cellulitis [facility] where he presented with SOB. Found to have strep,with general decline and [facility] Intubated 12/19, trach 12/23 vent, started trach collar 12/26.      SLP Plan  Continue with current plan of care     Recommendations  Diet recommendations: Dysphagia 2 (fine chop);Thin liquid Liquids provided via: Cup;Straw Medication Administration: Crushed with puree Supervision: Patient able to self feed;Full supervision/cueing for compensatory strategies Compensations: Minimize environmental distractions;Small sips/bites;Slow rate;Follow solids with liquid;Other (Comment) (must wear PMSV) Postural Changes and/or Swallow Maneuvers: Upright 30-60 min after meal;Seated upright 90 degrees      Patient may use Passy-Muir Speech Valve: Intermittently with supervision;During all therapies with supervision;During PO intake/meals PMSV Supervision: Full MD: Please consider changing trach tube to : Cuffless       Oral Care Recommendations: Oral care BID Follow up Recommendations: LTACH Plan:  Continue with current plan of care   Adrian [name], M.A. CCC-SLP [phone]  Adrian Hamaiewonsky, Arpita Fentress 10/16/2015, 12:04 PM

## 2015-10-16 NOTE — Progress Notes (Addendum)
Nutrition Consult/Follow Up  DOCUMENTATION CODES:   Morbid obesity  INTERVENTION:    Add Carbohydrate Modified comment to diet order -- done.  Dys 2, Carbohydrate Modified diet will provide ~ 1800 kcals per day.   Change to nocturnal TF regimen -- Vital AF 1.2 formula at 75 ml/hr from 7 PM to 7 AM.  Total time is 12 hours.  TF regimen to provide 1080 kcals, 67 gm protein, 730 ml of free water  NUTRITION DIAGNOSIS:   Inadequate oral intake related to dysphagia as evidenced by meal completion < 50%, ongoing  GOAL:   Patient will meet greater than or equal to 90% of their needs, met  MONITOR:   PO intake, Supplement acceptance, Labs, Weight trends, I & O's  ASSESSMENT:   54 year old, 514 lb male with panus and leg infections, sepsis. Hypoxic resp failure due to ALI. Intubated 12/18.   Patient s/p FEES 12/29 -- SLP recommending Dys 2-thin liquid diet.  PO intake ~ 35% per flowsheet records.  Will change to nocturnal TF regimen until pt is consuming ~ 60-75% of meals consistently.  Patient s/p trach change today.  Weight loss handouts from [employer] provided; left on tray table.  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  Estimated Nutritional Needs:   Kcal:  2000-2200  Protein:  125-135 gm  Fluid:  2.0-2.2 L  EDUCATION NEEDS:   Education needs addressed  Arthur Holms, RD, LDN Pager #: [phone] After-Hours Pager #: [phone]

## 2015-10-16 NOTE — Progress Notes (Signed)
[facility] PCCM tracheostomy change note  Procedure: First tracheostomy change Indication: Acute on chronic respiratory failure with hypoxemia   Procedure description: The patient was awake throughout the entire procedure. We explained the risks and benefits to him prior to proceeding. He was laid back in the supine position and we passed the suction catheter through the #6 cuffed Shiley tracheostomy tube. He had some coughing at this point. We then deflated the cuff and remove the #6 tracheostomy tube and replaced it with a #6 cuffless tracheostomy tube. He tolerated the procedure well.  No plans to decannulate Okay for speaking valve  Heber CarolinaBrent Aman Batley, MD [place] [facility] Pager: [phone] Cell: [phone] After 3pm or if no response, call [phone]

## 2015-10-16 NOTE — Progress Notes (Signed)
Pharmacy Antibiotic Follow-up Note  Lieutenant DiegoCarlton B Deeb Jr. is a 54 y.o. year-old male admitted on 10/04/2015.  The patient is currently on day 13 of levofloxacin for group G streptococcal bacteremia and day 2 of vancomycin for coag neg staph in 1/2 bc.  Assessment/Plan: After discussion with Dr. Joseph ArtWoods, the length of therapy for streptococcal bacteremia is 14 days. Patient is currently being treated with levofloxacin with a stop date of 12/31.   Also, the vancomycin will be stopped with 1/2 BC growing CONS, most likely contaminant.  Patient grew candida in tracheal aspirate and has large amount of secretions in sputum so fluconazole has been added  Temp (24hrs), Avg:99.2 F (37.3 C), Min:98.7 F (37.1 C), Max:100.1 F (37.8 C)   Recent Labs Lab 10/12/15 0500 10/13/15 0230 10/14/15 1305 10/15/15 0302 10/16/15 0820  WBC 10.7* 16.4* 10.5 11.4* 9.9    Recent Labs Lab 10/12/15 0106 10/12/15 0500 10/13/15 0230 10/14/15 0144 10/15/15 0302  CREATININE 1.64* 1.69* 1.66* 1.72* 1.44*   Estimated Creatinine Clearance: 104.5 mL/min (by C-G formula based on Cr of 1.44).    No Known Allergies  Antimicrobials this admission: LVQ 12/22 >> Rocephin 12/17 >>12/19 Zosyn 12/19 >>12/22 Vanc 12/18 >>12/22; restart 12/29>> Clinda 12/19 >>12/19 Fluconazole 12/30>>  Levels/dose changes this admission: 12/20 VT at 2150 = 40 mcg/ml on 1500mg  q12 >> held 12/21 VR at 1139 = 33 mcg/ml >> held  Microbiology results: [facility] Group G strep (S to Vanc, Amp, Clinda, Levaquin) 12/18 BCx x2 - 1/2 CoNS 12/19 BCx x2 - negative 12/18 UCx - negative 12/18 MRSA PCR - positive  12/19 TA - moderate C.albicans 12/28 BCx x2 - 1/2 GPC 12/28 UCx - NEG 12/28 C.diff - negative  Thank you for allowing pharmacy to be a part of this patient's care.  Isaac BlissMichael Shereta Crothers, PharmD, BCPS, [facility] Clinical Pharmacist Pager [phone] 10/16/2015 1:12 PM

## 2015-10-17 LAB — COMPREHENSIVE METABOLIC PANEL
ALT: 151 U/L — ABNORMAL HIGH (ref 17–63)
ALT: 151 U/L — AB (ref 17–63)
AST: 80 U/L — AB (ref 15–41)
AST: 90 U/L — AB (ref 15–41)
Albumin: 2.7 g/dL — ABNORMAL LOW (ref 3.5–5.0)
Albumin: 2.6 g/dL — ABNORMAL LOW (ref 3.5–5.0)
Alkaline Phosphatase: 74 U/L (ref 38–126)
Alkaline Phosphatase: 73 U/L (ref 38–126)
Anion gap: 13 (ref 5–15)
BILIRUBIN TOTAL: 0.6 mg/dL (ref 0.3–1.2)
BUN: UNDETERMINED mg/dL (ref 6–20)
BUN: 48 mg/dL — AB (ref 6–20)
CHLORIDE: UNDETERMINED mmol/L (ref 101–111)
CHLORIDE: 109 mmol/L (ref 101–111)
CO2: UNDETERMINED mmol/L (ref 22–32)
CO2: 30 mmol/L (ref 22–32)
CREATININE: 1.71 mg/dL — AB (ref 0.61–1.24)
Calcium: UNDETERMINED mg/dL (ref 8.9–10.3)
Calcium: 8.9 mg/dL (ref 8.9–10.3)
Creatinine, Ser: 1.61 mg/dL — ABNORMAL HIGH (ref 0.61–1.24)
GFR calc Af Amer: 51 mL/min — ABNORMAL LOW (ref >60)
GFR, EST AFRICAN AMERICAN: 54 mL/min — AB (ref >60)
GFR, EST NON AFRICAN AMERICAN: 47 mL/min — AB (ref >60)
GFR, EST NON AFRICAN AMERICAN: 44 mL/min — AB (ref >60)
Glucose, Bld: 119 mg/dL — ABNORMAL HIGH (ref 65–99)
Glucose, Bld: 135 mg/dL — ABNORMAL HIGH (ref 65–99)
POTASSIUM: UNDETERMINED mmol/L (ref 3.5–5.1)
Potassium: 5.1 mmol/L (ref 3.5–5.1)
SODIUM: 152 mmol/L — AB (ref 135–145)
Sodium: UNDETERMINED mmol/L (ref 135–145)
TOTAL PROTEIN: 6.6 g/dL (ref 6.5–8.1)
Total Bilirubin: 1.8 mg/dL — ABNORMAL HIGH (ref 0.3–1.2)
Total Protein: 6.4 g/dL — ABNORMAL LOW (ref 6.5–8.1)

## 2015-10-17 LAB — GLUCOSE, CAPILLARY
GLUCOSE-CAPILLARY: 135 mg/dL — AB (ref 65–99)
Glucose-Capillary: 134 mg/dL — ABNORMAL HIGH (ref 65–99)
Glucose-Capillary: 135 mg/dL — ABNORMAL HIGH (ref 65–99)
Glucose-Capillary: 127 mg/dL — ABNORMAL HIGH (ref 65–99)
Glucose-Capillary: 125 mg/dL — ABNORMAL HIGH (ref 65–99)
Glucose-Capillary: 127 mg/dL — ABNORMAL HIGH (ref 65–99)

## 2015-10-17 LAB — CULTURE, BLOOD (ROUTINE X 2)

## 2015-10-17 LAB — MAGNESIUM: Magnesium: UNDETERMINED mg/dL (ref 1.7–2.4)

## 2015-10-17 LAB — CBC WITH DIFFERENTIAL/PLATELET
HEMATOCRIT: 32 % — AB (ref 39.0–52.0)
HEMOGLOBIN: 9.9 g/dL — AB (ref 13.0–17.0)
MCH: 28.4 pg (ref 26.0–34.0)
MCHC: 30.9 g/dL (ref 30.0–36.0)
MCV: 91.7 fL (ref 78.0–100.0)
Platelets: 327 10*3/uL (ref 150–400)
RBC: 3.49 MIL/uL — AB (ref 4.22–5.81)
RDW: 17.6 % — ABNORMAL HIGH (ref 11.5–15.5)
WBC: 11.2 10*3/uL — AB (ref 4.0–10.5)

## 2015-10-17 MED ORDER — ANTISEPTIC ORAL RINSE SOLUTION (CORINZ)
7.0000 mL | Freq: Four times a day (QID) | OROMUCOSAL | Status: DC
  Administered 2015-10-17 – 2015-10-20 (×9): 7 mL via OROMUCOSAL

## 2015-10-17 NOTE — Progress Notes (Addendum)
Pt on list for PICC line today. RN attempted to call home no numerous times to arrange consent. Will see if visitors come today in hopes of clarifying mother/guardian to obtain consent.Per pt mother is in hospital Engineer, building services([employer]) He states his sister can be reached on [phone] (No answer as yet).

## 2015-10-17 NOTE — Progress Notes (Signed)
Peripherally Inserted Central Catheter/Midline Placement  The IV Nurse has discussed with the patient and/or persons authorized to consent for the patient, the purpose of this procedure and the potential benefits and risks involved with this procedure.  The benefits include less needle sticks, lab draws from the catheter and patient may be discharged home with the catheter.  Risks include, but not limited to, infection, bleeding, blood clot (thrombus formation), and puncture of an artery; nerve damage and irregular heat beat.  Alternatives to this procedure were also discussed.  Phone consent obtain from mother  PICC/Midline Placement Documentation  PICC Single Lumen 10/17/15 PICC Right 43 cm 0 cm (Active)  Indication for Insertion or Continuance of Line Home intravenous therapies (PICC only) 10/17/2015  2:50 PM  Exposed Catheter (cm) 0 cm 10/17/2015  2:50 PM  Site Assessment Clean;Dry;Intact 10/17/2015  2:50 PM  Line Status Flushed;Saline locked;Blood return noted 10/17/2015  2:50 PM  Dressing Change Due 10/24/15 10/17/2015  2:50 PM       Ethelda ChickCurrie, Dagan Heinz Robert 10/17/2015, 2:52 PM

## 2015-10-17 NOTE — Progress Notes (Signed)
rec'd verbal order from MD to hold free water flush and tube feeding for the night due to projectile vomiting episodes

## 2015-10-17 NOTE — Progress Notes (Signed)
Received notice from Sue Lushndrea, Rep for [facility] that pt could be admitted to facility over this Holiday Weekend if and when Medically ready for discharge. Made Adrian ArtWoods, MD aware by Text. CM will continue to follow for Discharge/Disposition needs.

## 2015-10-17 NOTE — Progress Notes (Signed)
Pt given free water 500 cc q 3 hr per orders (Hypernatremic) Pt had 2 episodes vomiting between 4-7pm.MD aware.

## 2015-10-17 NOTE — Progress Notes (Addendum)
[place] TEAM 1 - Stepdown/ICU TEAM Progress Note  Adrian Adrian B Kinyon Jr. ZHY:[id number] DOB: 1960-12-04 DOA: 10/04/2015 PCP: No PCP Per Patient  Admit HPI / Brief Narrative: 54 yo WM PMHx MORBIDLY obese (527 pounds), OSA/OHS,  [facility], drainage from left leg and panus consistent with cellulitis.   HPI/Subjective: 12/31  A/O 4, NAD. Continues to be uncooperative with treatment plan. Patient appears to be totally helpless (i.e. won't even move arms in order to assist us with exam ) unless food is involved at which point patient is able to move extremities as required.   Assessment/Plan: Cellulitis of panus with chronic wound (abdominal wall cellulitis)/ Group G strep bacteremia -TEE negative for vegetations - current plan is to complete 14 days of levaquin  -Tmax overnight 37.9 C -Pan culture pending   ARDS/positive moderate Candida Albicans PNA continuing to improve - PCCM following trach/pulm issues for now  -12/29 trach changed to a  #6 cuffless  -Diflucan IV 200 mg daily 14 days  Dysphagia -Passed swallow study;Dysphagia 2 (fine chop);Thin liquid -Have requested nutrition help with a carb modified 2000-calorie/day diet  Hx of asthma  -No evidence of wheezing today   Acute pulmonary edema with total body volume overload -Strict in and out; since admission - 12.5 L  -Daily weights; admission weight= 233.1 kg            12/31 weight= 204 kg  Hypernatremia -Continue free water via feeding tube to 500 ml q 3 hr  -Continue D5W 4075ml/hr  Obstructive sleep apnea / Obesity hypoventilation Syndrome -Now on trach collar with blow-by    HTN -Continue amlodipine 10 mg daily  -Increase Metoprolol 100 mg  BID   Super morbid obesity - Body mass index is 61.52 kg/(m^2).  -12/30 Start Calorie count -Consult nutrition for 2000-calorie/day diabetic diet  Bipolar D/O  DM Type 2 -12/16 hemoglobin A1c= 5.9 -Fairly well controlled at this time    Hypothyroid -12/16 TSH =4.5  -Increase Synthroid to 100 g daily      Code Status: FULL Family Communication: no family present at time of exam Disposition Plan: SNF    Consultants: [facility] SledgeDr.[facility]   Procedure/Significant Events: [facility] 12/18 [facility] 12/23 Tracheostomy 12/23 TEE - normal LV fxn - no vegetation 12/25 off vent during daytime 12/27 off vent > TC   Culture 12/16 blood right chest/AC positive group G strep 12/18 MRSA by PCR positive 12/19 blood right hand negative final 12/19 tracheal aspirate positive moderate Candida Albicans 12/21 blood left hand/ AC negative final 12/28 blood left hand positive coag negative staph 12/28 blood right hand NGTD 12/28 urine NGTD   Antibiotics: Rocephin 12/18>>12/19 Zosyn 12/19>>12/22 Levofloxacin 12/22>>12/31 Vancomycin 12/201 dose : 12/29>>12/30   Diflucan 12/30>>  DVT prophylaxis: Heparin subcutaneous   Devices    LINES / TUBES:      Continuous Infusions: . sodium chloride Stopped (10/11/15 0300)  . dextrose 75 mL/hr at 10/17/15 0200    Objective: VITAL SIGNS: Temp: 98.7 F (37.1 C) (12/31 0400) Temp Source: Oral (12/31 0400) BP: 161/78 mmHg (12/31 0400) Pulse Rate: 96 (12/31 0400) SPO2; FIO2:   Intake/Output Summary (Last 24 hours) at 10/17/15 0626 Last data filed at 10/17/15 0300  Gross per 24 hour  Intake 4316.25 ml  Output   3801 ml  Net 515.25 ml     Exam: General: A/O 4, positive acute respiratory  distress, improving now on trach collar Eyes: Negative headache, negative scleral hemorrhage ENT: Negative Runny nose, negative ear pain, negative gingival bleeding Neck:  Negative scars, masses, torticollis, lymphadenopathy, JVD, tracheostomy present negative thick yellow discharge observed today. Lungs: Clear to auscultation bilaterally without wheezes or crackles (extremely difficult to hear  secondary to MORBID OBESITY) Cardiovascular: Regular rate and rhythm without murmur gallop or rub normal S1 and S2 (extremely difficult to hear secondary to MORBID OBESITY) Abdomen: negative abdominal pain, nondistended, positive soft, bowel sounds, no rebound, no ascites, no appreciable mass. Midline vertical grade 2 ulceration of his pannus; good granulation tissue, negative discharge.  Extremities: No significant cyanosis, clubbing, or edema bilateral lower extremities Psychiatric:  Negative depression, negative anxiety, negative fatigue, negative mania  Neurologic:  Cranial nerves II through XII intact, tongue/uvula midline, all extremities muscle strength 5/5, sensation intact throughout, negative dysarthria, negative expressive aphasia, negative receptive aphasia.   Data Reviewed: Basic Metabolic Panel:  Recent Labs Lab 10/11/15 0425  10/12/15 0500 10/13/15 0230 10/14/15 0144 10/15/15 0302 10/16/15 1235 10/17/15 0407  NA 155*  < > 155* 155* 156* 163* 154* QUANTITY NOT SUFFICIENT, UNABLE TO PERFORM TEST  K 3.6  < > 4.5 4.2 4.5 4.1 3.8 QUANTITY NOT SUFFICIENT, UNABLE TO PERFORM TEST  CL 114*  < > 113* 113* 112* 117* 111 QUANTITY NOT SUFFICIENT, UNABLE TO PERFORM TEST  CO2 30  < > 31 29 29  32 30 QUANTITY NOT SUFFICIENT, UNABLE TO PERFORM TEST  GLUCOSE 151*  < > 156* 131* 138* 128* 131* 119*  BUN 70*  < > 75* 76* 65* 59* 51* QUANTITY NOT SUFFICIENT, UNABLE TO PERFORM TEST  CREATININE 1.71*  < > 1.69* 1.66* 1.72* 1.44* 1.64* 1.61*  CALCIUM 8.8*  < > 8.6* 8.7* 8.8* 9.3 8.5* QUANTITY NOT SUFFICIENT, UNABLE TO PERFORM TEST  MG 2.2  --  2.3  --   --  2.8* 2.3 QUANTITY NOT SUFFICIENT, UNABLE TO PERFORM TEST  PHOS 4.5  --  5.1*  --   --   --   --   --   < > = values in this interval not displayed. Liver Function Tests:  Recent Labs Lab 10/12/15 0500 10/16/15 1235 10/17/15 0407  AST 46* 75* 80*  ALT 81* 149* 151*  ALKPHOS 81 74 74  BILITOT 0.3 0.5 0.6  PROT 6.0* 6.4* 6.6  ALBUMIN  2.1* 2.6* 2.7*   No results for input(s): LIPASE, AMYLASE in the last 168 hours. No results for input(s): AMMONIA in the last 168 hours. CBC:  Recent Labs Lab 10/13/15 0230 10/14/15 1305 10/15/15 0302 10/16/15 0820 10/17/15 0407  WBC 16.4* 10.5 11.4* 9.9 11.2*  NEUTROABS 13.2*  --   --  7.1  --   HGB 9.0* 9.2* 9.9* 9.2* 9.9*  HCT 30.8* 31.5* 34.0* 31.3* 32.0*  MCV 92.2 93.2 95.5 93.7 91.7  PLT 456* 355 362 355 327   Cardiac Enzymes:  Recent Labs Lab 10/10/15 1300  TROPONINI 0.03   BNP (last 3 results)  Recent Labs  10/04/15 2350  BNP 239.7*    ProBNP (last 3 results) No results for input(s): PROBNP in the last 8760 hours.  CBG:  Recent Labs Lab 10/16/15 1214 10/16/15 1632 10/16/15 2007 10/17/15 0043 10/17/15 0402  GLUCAP 122* 116* 136* 134* 135*    Recent Results (from the past 240 hour(s))  Culture, blood (Routine X 2) w Reflex to ID Panel     Status: None   Collection Time: 10/07/15 12:40 PM  Result Value Ref Range Status   Specimen Description BLOOD LEFT HAND  Final   Special Requests BOTTLES DRAWN AEROBIC AND ANAEROBIC 5CC  Final   Culture NO GROWTH 5 DAYS  Final   Report Status 10/12/2015 FINAL  Final  Culture, blood (Routine X 2) w Reflex to ID Panel     Status: None   Collection Time: 10/07/15 12:46 PM  Result Value Ref Range Status   Specimen Description BLOOD LEFT ANTECUBITAL  Final   Special Requests BOTTLES DRAWN AEROBIC AND ANAEROBIC 5CC  Final   Culture NO GROWTH 5 DAYS  Final   Report Status 10/12/2015 FINAL  Final  Culture, blood (routine x 2)     Status: None (Preliminary result)   Collection Time: 10/14/15  1:44 AM  Result Value Ref Range Status   Specimen Description BLOOD LEFT HAND  Final   Special Requests BOTTLES DRAWN AEROBIC AND ANAEROBIC  Final   Culture  Setup Time   Final    GRAM POSITIVE COCCI IN CLUSTERS IN BOTH AEROBIC AND ANAEROBIC BOTTLES CRITICAL RESULT CALLED TO, READ BACK BY AND VERIFIED WITH: B REAP RN  2314 10/14/15 A BROWNING    Culture   Final    STAPHYLOCOCCUS SPECIES (COAGULASE NEGATIVE) THE SIGNIFICANCE OF ISOLATING THIS ORGANISM FROM A SINGLE SET OF BLOOD CULTURES WHEN MULTIPLE SETS ARE DRAWN IS UNCERTAIN. PLEASE NOTIFY THE MICROBIOLOGY DEPARTMENT WITHIN ONE WEEK IF SPECIATION AND SENSITIVITIES ARE REQUIRED.    Report Status PENDING  Incomplete  Culture, blood (routine x 2)     Status: None (Preliminary result)   Collection Time: 10/14/15  1:51 AM  Result Value Ref Range Status   Specimen Description BLOOD RIGHT HAND  Final   Special Requests BOTTLES DRAWN AEROBIC AND ANAEROBIC  Final   Culture NO GROWTH 2 DAYS  Final   Report Status PENDING  Incomplete  Culture, Urine     Status: None   Collection Time: 10/14/15  1:32 PM  Result Value Ref Range Status   Specimen Description URINE, CATHETERIZED  Final   Special Requests NONE  Final   Culture NO GROWTH 1 DAY  Final   Report Status 10/15/2015 FINAL  Final  C difficile quick scan w PCR reflex     Status: None   Collection Time: 10/15/15  4:29 AM  Result Value Ref Range Status   C Diff antigen NEGATIVE NEGATIVE Final   C Diff toxin NEGATIVE NEGATIVE Final   C Diff interpretation Negative for toxigenic C. difficile  Final     Studies:  Recent x-ray studies have been reviewed in detail by the Attending Physician  Scheduled Meds:  Scheduled Meds: . amLODipine  10 mg Per Tube Daily  . antiseptic oral rinse  7 mL Mouth Rinse 10 times per day  . chlorhexidine gluconate  15 mL Mouth Rinse BID  . collagenase   Topical Daily  . feeding supplement (VITAL AF 1.2 CAL)  1,000 mL Per Tube Q24H  . fluconazole (DIFLUCAN) IV  200 mg Intravenous Q24H  . free water  500 mL Per Tube Q3H  . heparin subcutaneous  5,000 Units Subcutaneous 3 times per day  . insulin aspart  0-9 Units Subcutaneous 6 times per day  . levofloxacin (LEVAQUIN) IV  750 mg Intravenous Q24H  . levothyroxine  100 mcg Per Tube QAC breakfast  . metoprolol  tartrate  100 mg Oral BID  . oxcarbazepine  600 mg Per Tube BID  . pantoprazole sodium  40 mg  Per Tube Daily  . sodium chloride  10-40 mL Intracatheter Q12H  . ziprasidone  80 mg Oral BID WC    Time spent on care of this patient: 40 mins   Adrian [name], Roselind Messier , MD  [facility] Office  [phone] Pager [phone]  On-Call/Text Page:      [email or web address]      password [email or web address]  If 7PM-7AM, please contact night-coverage [email or web address] Password [email or web address] 10/17/2015, 6:26 AM   LOS: 13 days   Care during the described time interval was provided by me .  I have reviewed this patient's available data, including medical history, events of note, physical examination, and all test results as part of my evaluation. I have personally reviewed and interpreted all radiology studies.   Carolyne Littles, MD [phone] Pager

## 2015-10-18 ENCOUNTER — Encounter (HOSPITAL_COMMUNITY): Payer: Self-pay

## 2015-10-18 DIAGNOSIS — E662 Morbid (severe) obesity with alveolar hypoventilation: Secondary | ICD-10-CM

## 2015-10-18 DIAGNOSIS — J9601 Acute respiratory failure with hypoxia: Secondary | ICD-10-CM

## 2015-10-18 DIAGNOSIS — J9621 Acute and chronic respiratory failure with hypoxia: Secondary | ICD-10-CM

## 2015-10-18 DIAGNOSIS — E870 Hyperosmolality and hypernatremia: Secondary | ICD-10-CM

## 2015-10-18 DIAGNOSIS — Z430 Encounter for attention to tracheostomy: Secondary | ICD-10-CM

## 2015-10-18 LAB — BASIC METABOLIC PANEL
ANION GAP: 12 (ref 5–15)
BUN: 44 mg/dL — ABNORMAL HIGH (ref 6–20)
CALCIUM: 8.1 mg/dL — AB (ref 8.9–10.3)
CO2: 29 mmol/L (ref 22–32)
Chloride: 111 mmol/L (ref 101–111)
Creatinine, Ser: 1.74 mg/dL — ABNORMAL HIGH (ref 0.61–1.24)
GFR calc Af Amer: 50 mL/min — ABNORMAL LOW (ref >60)
GFR, EST NON AFRICAN AMERICAN: 43 mL/min — AB (ref >60)
GLUCOSE: 134 mg/dL — AB (ref 65–99)
Potassium: 3.2 mmol/L — ABNORMAL LOW (ref 3.5–5.1)
Sodium: 152 mmol/L — ABNORMAL HIGH (ref 135–145)

## 2015-10-18 LAB — COMPREHENSIVE METABOLIC PANEL
ALBUMIN: 2.5 g/dL — AB (ref 3.5–5.0)
ALK PHOS: 67 U/L (ref 38–126)
ALT: 119 U/L — ABNORMAL HIGH (ref 17–63)
ANION GAP: 13 (ref 5–15)
AST: 46 U/L — ABNORMAL HIGH (ref 15–41)
BUN: 47 mg/dL — ABNORMAL HIGH (ref 6–20)
CALCIUM: 8.4 mg/dL — AB (ref 8.9–10.3)
CHLORIDE: 110 mmol/L (ref 101–111)
CO2: 29 mmol/L (ref 22–32)
Creatinine, Ser: 1.55 mg/dL — ABNORMAL HIGH (ref 0.61–1.24)
GFR calc Af Amer: 57 mL/min — ABNORMAL LOW (ref >60)
GFR calc non Af Amer: 49 mL/min — ABNORMAL LOW (ref >60)
GLUCOSE: 129 mg/dL — AB (ref 65–99)
Potassium: 3.3 mmol/L — ABNORMAL LOW (ref 3.5–5.1)
SODIUM: 152 mmol/L — AB (ref 135–145)
Total Bilirubin: 0.6 mg/dL (ref 0.3–1.2)
Total Protein: 5.9 g/dL — ABNORMAL LOW (ref 6.5–8.1)

## 2015-10-18 LAB — CBC WITH DIFFERENTIAL/PLATELET
BASOS PCT: 0 %
Basophils Absolute: 0 10*3/uL (ref 0.0–0.1)
Eosinophils Absolute: 0.2 10*3/uL (ref 0.0–0.7)
Eosinophils Relative: 2 %
HEMATOCRIT: 31.1 % — AB (ref 39.0–52.0)
HEMOGLOBIN: 9.2 g/dL — AB (ref 13.0–17.0)
LYMPHS ABS: 1.9 10*3/uL (ref 0.7–4.0)
LYMPHS PCT: 16 %
MCH: 27.3 pg (ref 26.0–34.0)
MCHC: 29.6 g/dL — AB (ref 30.0–36.0)
MCV: 92.3 fL (ref 78.0–100.0)
MONO ABS: 0.5 10*3/uL (ref 0.1–1.0)
MONOS PCT: 4 %
NEUTROS ABS: 9.1 10*3/uL — AB (ref 1.7–7.7)
Neutrophils Relative %: 78 %
Platelets: 364 10*3/uL (ref 150–400)
RBC: 3.37 MIL/uL — ABNORMAL LOW (ref 4.22–5.81)
RDW: 17.4 % — AB (ref 11.5–15.5)
WBC: 11.6 10*3/uL — ABNORMAL HIGH (ref 4.0–10.5)

## 2015-10-18 LAB — MAGNESIUM: Magnesium: 2.2 mg/dL (ref 1.7–2.4)

## 2015-10-18 LAB — GLUCOSE, CAPILLARY
Glucose-Capillary: 124 mg/dL — ABNORMAL HIGH (ref 65–99)
Glucose-Capillary: 134 mg/dL — ABNORMAL HIGH (ref 65–99)
Glucose-Capillary: 129 mg/dL — ABNORMAL HIGH (ref 65–99)
Glucose-Capillary: 140 mg/dL — ABNORMAL HIGH (ref 65–99)

## 2015-10-18 MED ORDER — INSULIN ASPART 100 UNIT/ML ~~LOC~~ SOLN
0.0000 [IU] | Freq: Three times a day (TID) | SUBCUTANEOUS | Status: DC
  Administered 2015-10-18: 4 [IU] via SUBCUTANEOUS
  Administered 2015-10-18 – 2015-10-19 (×3): 1 [IU] via SUBCUTANEOUS

## 2015-10-18 MED ORDER — BARIUM SULFATE 2.1 % PO SUSP
ORAL | Status: AC
  Filled 2015-10-18: qty 2

## 2015-10-18 MED ORDER — INSULIN ASPART 100 UNIT/ML ~~LOC~~ SOLN: 0.0000 [IU] | Freq: Every day | SUBCUTANEOUS | Status: DC

## 2015-10-18 MED ORDER — LIDOCAINE HCL (PF) 2 % IJ SOLN
5.0000 mL | Freq: Once | INTRAMUSCULAR | Status: AC
  Administered 2015-10-18: 5 mL

## 2015-10-18 NOTE — Progress Notes (Signed)
PULMONARY / CRITICAL CARE MEDICINE   Name: Adrian [name]. MRN: [id number] DOB: Sep 01, 1961    ADMISSION DATE:  10/04/2015  REFERRING MD:  [facility]  CHIEF COMPLAINT: SOB  HISTORY OF PRESENT ILLNESS:   55 y/o who presented  to [facility] 12/16 with SOB @  527 lbs and has OSA, also has drainage from left leg and panus consistent with cellulitis.  SUBJECTIVE/overnight:    Trach coughed out, back in per Dr Jamison Neighbor over bronchoscope / comfortable on t collar lying flat  @ 40%  New fever spike noted   VITAL SIGNS: BP 148/70 mmHg  Pulse 98  Temp(Src) 101.2 F (38.4 C) (Oral)  Resp 29  Ht 5\' 11"  (1.803 m)  Wt 425 lb 7.8 oz (193 kg)  BMI 59.37 kg/m2  SpO2 97%  HEMODYNAMICS:    VENTILATOR SETTINGS: Vent Mode:  [-]  FiO2 (%):  [40 %] 40 %  INTAKE / OUTPUT: I/O last 3 completed shifts: In: 8171.3 [P.O.:180; I.V.:2391.3; Other:1500; NG/GT:3500; IV Piggyback:600] Out: 3350 [Urine:3350]  PHYSICAL EXAMINATION:  Gen: Morbidly obese male lying in bed, awake, no distress Lungs: even/non-labored, lungs bilaterally with good air movement, no wheezing  CV: RRR, distant, no clear murmur GI: Obese, soft, nontender, BS+- note has virtually no motion with insp but no paradox either  Neuro: awake and alert, answering questions and following commands Skin: massive panus, chronic appearing wound well dressed in panus, no drainage, fungal appearing erythema under panus in fold   LABS:  BMET  Recent Labs Lab 10/17/15 0407 10/17/15 0730 10/18/15 0633  NA QUANTITY NOT SUFFICIENT, UNABLE TO PERFORM TEST 152* 152*  K QUANTITY NOT SUFFICIENT, UNABLE TO PERFORM TEST 5.1 3.3*  CL QUANTITY NOT SUFFICIENT, UNABLE TO PERFORM TEST 109 110  CO2 QUANTITY NOT SUFFICIENT, UNABLE TO PERFORM TEST 30 29  BUN QUANTITY NOT SUFFICIENT, UNABLE TO PERFORM TEST 48* 47*  CREATININE 1.61* 1.71* 1.55*  GLUCOSE 119* 135* 129*   Electrolytes  Recent Labs Lab 10/12/15 0500  10/16/15 1235 10/17/15 0407  10/17/15 0730 10/18/15 0633  CALCIUM 8.6*  < > 8.5* QUANTITY NOT SUFFICIENT, UNABLE TO PERFORM TEST 8.9 8.4*  MG 2.3  < > 2.3 QUANTITY NOT SUFFICIENT, UNABLE TO PERFORM TEST  --  2.2  PHOS 5.1*  --   --   --   --   --   < > = values in this interval not displayed.   CBC  Recent Labs Lab 10/16/15 0820 10/17/15 0407 10/18/15 0633  WBC 9.9 11.2* 11.6*  HGB 9.2* 9.9* 9.2*  HCT 31.3* 32.0* 31.1*  PLT 355 327 364   Coag's No results for input(s): APTT, INR in the last 168 hours.   Sepsis Markers No results for input(s): LATICACIDVEN, PROCALCITON, O2SATVEN in the last 168 hours.   ABG No results for input(s): PHART, PCO2ART, PO2ART in the last 168 hours. Liver Enzymes  Recent Labs Lab 10/17/15 0407 10/17/15 0730 10/18/15 0633  AST 80* 90* 46*  ALT 151* 151* 119*  ALKPHOS 74 73 67  BILITOT 0.6 1.8* 0.6  ALBUMIN 2.7* 2.6* 2.5*   Cardiac Enzymes No results for input(s): TROPONINI, PROBNP in the last 168 hours. Glucose  Recent Labs Lab 10/17/15 0402 10/17/15 0833 10/17/15 1312 10/17/15 1730 10/17/15 2108 10/18/15 0827  GLUCAP 135* 135* 127* 125* 127* 124*   Imaging No results found.   STUDIES:  12/23 TEE with no vegetations  CULTURES: 12/16  bc [facility]) >> Strep group g 2 of 2 >> levaquin sensitive  12/16  UC ([facility]) >> multiple species 12/18  urine >> negative 12/19  Resp >> moderate candida 12/18  Blood >> Coag negative staph  12/21  Blood >> neg 12/28  Blood >> gram + cocci in clusters > coag neg staph 12/28  Urine >> negative 12/28  C-diff >> negative 1/1 trach cultures >>>  ANTIBIOTICS: Ceftriaxone 12/17 >> 12/19 Vanco 12/18 >>12/22 Clinda 12/19 >> 12/19 Zosyn 12/19 >>12/22 Levaquin 12/22 >>   Vancomycin 12/28> 12/29  Diflucan 12/30 >>>  SIGNIFICANT EVENTS: 12[facility]16  [facility] 12/18  [facility]  Tracheostomy 12/25  off vent during daytime 12/27  off vent>TC 12/28  Fever, +  blood cultures 12/29  FEES >> mild oral phase dysphagia, mild pharyngeal phase dysphagia, moderate aspiration risk  LINES/TUBES: ETT 12/18 >> 12/23 Tracheostomy Shiley 6 mm Cuffed (JY) 12/23 > 12/30 changed to #6 cuffless and replaced 1/1 (coughed out)  L IJ TLC 12/20 >> 12/26  DISCUSSION: Morbidly obese male with panus and leg infections, sepsis, and hypoxic respiratory failure due to ALI superimposed on OHS +/- sedating meds. Intubated 12/18; tracheostomy 12/23, now with improving oxygenation, remains off vent and tolerating TC.    ASSESSMENT / PLAN:  PULMONARY A:  ARDS - resolving, progressed to ATC   ?HCAP - resolved Hx of asthma  Acute pulmonary edema with total body volume overload Obstructive sleep apnea - resolved with trach Obesity hypoventilation syndrome - currently doing well with out vent support P:   Continue PMV trials SLP following Aspiration precautions  trach to #6 cuffless, but do not plan to de-cannulate anytime soon Wean O2 for O2 saturation > 92% Pulmonary hygiene Mobilize, push PT  INFECTIOUS A:   Cellulitis of panus with chronic wound Group G strep bacteremia, TEE negative for vegetations HCAP / aspiration PNA; CXR 12/28 without  infiltrates Fever and leukocytosis; C-Diff negative; urine culture negative; blood cultures with gram positive cocci in clusters P:   Continue levaquin,   14 days given severity of illness.(see flowsheet above) Appreciate WOC consult  Trend WBC / fever curve - recheck sputum 1/1    Rest of treatment plan per [facility] PCCM to follow for respiratory status/trach   FAMILY  - Updates: No family bedside 1/1, discussed with nursing      Sandrea HughsMichael Seymone Forlenza, MD Pulmonary and Critical Care Medicine  Healthcare Cell [phone] After 5:30 PM or weekends, call [phone]

## 2015-10-18 NOTE — Procedures (Signed)
BRONCHOSCOPY PROCEDURE NOTE  Indication: Direct visualization of tracheostomy replacement. Time out called: Yes Consent: Verbal from patient. Bronchoscopist: Galvin Profferaniel Dinara Lupu, DO., MS Analgesia: 7cc 2% topical lidocaine  Nebulized lidocaine 2% was administered when adequate analgesia was achieved the bronchoscope was inserted through the mouth.  The oral cavity had thick, tenacious green secretions.  The focal folds were visualized and were grossly normal with normal movement.  7cc of topical lidocaine 2% was administered to the vocal folds.  The bronchoscope was then advanced passed the vocal folds.  The trachea was normal in calibur and appearance.  Tracheostomy was inserted under direct bronchoscopic visualization and was in appropriate position in the trachea.  The scope was then withdrawn from the mouth and placed in the tracheostomy the trachea and main carina were visualized confirming correct placement of the tracheostomy which was then secured.  The scope was withdrawn.  No samples were taken. Findings: uncomplicated replacement of tracheostomy. No bleeding during the procedure.  Total critical care time: 30 min  Critical care time was exclusive of separately billable procedures and treating other patients.  Critical care was necessary to treat or prevent imminent or life-threatening deterioration.  Critical care was time spent personally by me on the following activities: development of treatment plan with patient and/or surrogate as well as nursing, discussions with consultants, evaluation of patient's response to treatment, examination of patient, obtaining history from patient or surrogate, ordering and performing treatments and interventions, ordering and review of laboratory studies, ordering and review of radiographic studies, pulse oximetry and re-evaluation of patient's condition.   Galvin Profferaniel Bianney Rockwood, DO., MS [facility]

## 2015-10-18 NOTE — Procedures (Signed)
Description of Procedure:  Patient accidentally coughed out tracheostomy. Tracheostomy was replaced under direct bronchoscopic visualization by Dr. Hennie DuosLoVerde. Cuffless #6 tracheostomy was reinserted into patient's airway without difficulty. Placement was confirmed with visualization of the carina by Dr. Hennie DuosLoVerde through the tracheostomy. Patient had a moderate about of thick, yellow-tinged secretions from tracheostomy after replacement. Remained stable throughout the procedure with no desaturation or complication.  Donna ChristenJennings E. Jamison NeighborNestor, M.D. [facility] Pager:  [phone] After 3pm or if no response, call [phone]

## 2015-10-18 NOTE — Progress Notes (Signed)
Trach inserted Via MD and bronch after finding it was dislodged with midnight check by Rn. Patient tolerated well and Positive EtCO2 and BBS heard post replacement. Will continue to monitor issues

## 2015-10-18 NOTE — Progress Notes (Signed)
I went to measure patient for his CT scan of his abdomen, and he is to large to fit in the gantry.

## 2015-10-18 NOTE — Progress Notes (Signed)
Adrian [name] - Stepdown/ICU TEAM Progress Note  Adrian [name]. AVW:[id number] DOB: 01/27/1961 DOA: 10/04/2015 PCP: No PCP Per Patient  Admit HPI / Brief Narrative: 55 yo WM PMHx MORBIDLY obese (527 pounds), OSA/OHS,  [facility] with SOB, drainage from left leg and panus consistent with cellulitis.   HPI/Subjective: [name]/[name] A/O 4, sleeping in his own BM, will open his eyes to painful stimulation and states he is OK. NAD.    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   ARDS/positive moderate Candida Albicans PNA continuing to improve - PCCM following trach/pulm issues for now  -12/29 trach changed to a  #6 cuffless  -Diflucan IV 200 mg daily 14 days  Dysphagia -Passed swallow study;Dysphagia 2 (fine chop);Thin liquid -Have requested nutrition help with a carb modified 2000-calorie/day diet  Hx of asthma  -No evidence of wheezing today   Acute pulmonary edema with total body volume overload -Strict in and out; since admission - 12.5 L  -Daily weights; admission weight= 233.[name] kg            [name]/[name] weight= 193 kg  Hypernatremia -Continue free water via feeding tube to 500 ml q 3 hr  -Increase D5W 175 ml/hr  Obstructive sleep apnea / Obesity hypoventilation Syndrome -Now on trach collar with blow-by    HTN -Continue amlodipine 10 mg daily  -Continue Metoprolol 100 mg  BID   Super morbid obesity - Body mass index is 61.52 kg/(m^2).  -12/30 Start Calorie count -Consult nutrition for 2000-calorie/day diabetic diet  Bipolar D/O  DM Type 2 -12/16 hemoglobin A1c= 5.9 -Fairly well controlled at this time   Hypothyroid -12/16 TSH =4.5  -Increase Synthroid to 100 g daily      Code Status: FULL Family Communication: no family present at time  of exam Disposition Plan: SNF    Consultants: [facility]   Procedure/Significant Events: 12/16 admit to [facility] with panus infection 12/18 Transfer to [facility] due to no [facility] nurses at [facility] 12/23 Tracheostomy 12/23 TEE - normal LV fxn - no vegetation 12/25 off vent during daytime 12/27 off vent > TC   Culture 12/16 blood right chest/AC positive group G strep 12/18 MRSA by PCR positive 12/19 blood right hand negative final 12/19 tracheal aspirate positive moderate Candida Albicans 12/21 blood left hand/ AC negative final 12/28 blood left hand positive coag negative staph 12/28 blood right hand NGTD 12/28 urine negative final   Antibiotics: Rocephin 12/18>>12/19 Zosyn 12/19>>12/22 Levofloxacin 12/22>>12/31 Vancomycin 12/201 dose : 12/29>>12/30   Diflucan 12/30>>  DVT prophylaxis: Heparin subcutaneous   Devices    LINES / TUBES:      Continuous Infusions: . sodium chloride Stopped (10/11/15 0300)  . dextrose 400 mL (10/18/15 1206)    Objective: VITAL SIGNS: Temp: 99.9 F (37.7 C) (01/01 1334) Temp Source: Oral (01/01 1334) BP: 127/66 mmHg (01/01 1334) Pulse Rate: 85 (01/01 1334) SPO2; FIO2:   Intake/Output Summary (Last 24 hours) at 10/18/15 1353 Last data filed at 10/18/15 1300  Gross per 24 hour  Intake   3825 ml  Output   4002 ml  Net   -177 ml     Exam: General: Sleepy but arousable, A/O 4 once awake, positive acute respiratory distress, improving now on trach collar Eyes: Negative headache, negative scleral hemorrhage ENT: Negative  Runny nose, negative ear pain, negative gingival bleeding Neck:  Negative scars, masses, torticollis, lymphadenopathy, JVD, tracheostomy present negative thick yellow discharge observed today. Lungs: Clear to auscultation bilaterally without wheezes or crackles (extremely difficult to hear secondary to MORBID OBESITY) Cardiovascular: Regular rate and rhythm without murmur gallop or rub normal S1  and S2 (extremely difficult to hear secondary to MORBID OBESITY) Abdomen: negative abdominal pain, nondistended, positive soft, bowel sounds, no rebound, no ascites, no appreciable mass. Midline vertical grade 2 ulceration of his pannus; good granulation tissue, negative discharge.  Extremities: No significant cyanosis, clubbing, or edema bilateral lower extremities Psychiatric:  Negative depression, negative anxiety, negative fatigue, negative mania  Neurologic:  Cranial nerves II through XII intact, tongue/uvula midline, all extremities muscle strength 5/5, sensation intact throughout, negative dysarthria, negative expressive aphasia, negative receptive aphasia.   Data Reviewed: Basic Metabolic Panel:  Recent Labs Lab 10/12/15 0500  10/15/15 0302 10/16/15 1235 10/17/15 0407 10/17/15 0730 10/18/15 0633  NA 155*  < > 163* 154* QUANTITY NOT SUFFICIENT, UNABLE TO PERFORM TEST 152* 152*  K 4.5  < > 4.[name] 3.8 QUANTITY NOT SUFFICIENT, UNABLE TO PERFORM TEST 5.[name] 3.3*  CL 113*  < > 117* 111 QUANTITY NOT SUFFICIENT, UNABLE TO PERFORM TEST 109 110  CO2 31  < > 32 30 QUANTITY NOT SUFFICIENT, UNABLE TO PERFORM TEST 30 29  GLUCOSE 156*  < > 128* 131* 119* 135* 129*  BUN 75*  < > 59* 51* QUANTITY NOT SUFFICIENT, UNABLE TO PERFORM TEST 48* 47*  CREATININE [name].69*  < > [name].44* [name].64* [name].61* [name].71* [name].55*  CALCIUM 8.6*  < > 9.3 8.5* QUANTITY NOT SUFFICIENT, UNABLE TO PERFORM TEST 8.9 8.4*  MG 2.3  --  2.8* 2.3 QUANTITY NOT SUFFICIENT, UNABLE TO PERFORM TEST  --  2.2  PHOS 5.[name]*  --   --   --   --   --   --   < > = values in this interval not displayed. Liver Function Tests:  Recent Labs Lab 10/12/15 0500 10/16/15 1235 10/17/15 0407 10/17/15 0730 10/18/15 0633  AST 46* 75* 80* 90* 46*  ALT 81* 149* 151* 151* 119*  ALKPHOS 81 74 74 73 67  BILITOT 0.3 0.5 0.6 [name].8* 0.6  PROT 6.0* 6.4* 6.6 6.4* 5.9*  ALBUMIN 2.[name]* 2.6* 2.7* 2.6* 2.5*   No results for input(s): LIPASE, AMYLASE in the last 168 hours. No  results for input(s): AMMONIA in the last 168 hours. CBC:  Recent Labs Lab 10/13/15 0230 10/14/15 1305 10/15/15 0302 10/16/15 0820 10/17/15 0407 10/18/15 0633  WBC 16.4* 10.5 11.4* 9.9 11.2* 11.6*  NEUTROABS 13.2*  --   --  7.[name]  --  9.[name]*  HGB 9.0* 9.2* 9.9* 9.2* 9.9* 9.2*  HCT 30.8* 31.5* 34.0* 31.3* 32.0* 31.[name]*  MCV 92.2 93.2 95.5 93.7 91.7 92.3  PLT 456* 355 362 355 327 364   Cardiac Enzymes: No results for input(s): CKTOTAL, CKMB, CKMBINDEX, TROPONINI in the last 168 hours. BNP (last 3 results)  Recent Labs  10/04/15 2350  BNP 239.7*    ProBNP (last 3 results) No results for input(s): PROBNP in the last 8760 hours.  CBG:  Recent Labs Lab 10/17/15 0833 10/17/15 1312 10/17/15 1730 10/17/15 2108 10/18/15 0827  GLUCAP 135* 127* 125* 127* 124*    Recent Results (from the past 240 hour(s))  Culture, blood (routine x 2)     Status: None   Collection Time: 10/14/15  [name]:44 AM  Result Value Ref Range Status  Specimen Description BLOOD LEFT HAND  Final   Special Requests BOTTLES DRAWN AEROBIC AND ANAEROBIC  Final   Culture  Setup Time   Final    GRAM POSITIVE COCCI IN CLUSTERS IN BOTH AEROBIC AND ANAEROBIC BOTTLES CRITICAL RESULT CALLED TO, READ BACK BY AND VERIFIED WITH: B REAP RN [id number] 10/14/15 A BROWNING    Culture   Final    STAPHYLOCOCCUS SPECIES (COAGULASE NEGATIVE) THE SIGNIFICANCE OF ISOLATING THIS ORGANISM FROM A SINGLE SET OF BLOOD CULTURES WHEN MULTIPLE SETS ARE DRAWN IS UNCERTAIN. PLEASE NOTIFY THE MICROBIOLOGY DEPARTMENT WITHIN ONE WEEK IF SPECIATION AND SENSITIVITIES ARE REQUIRED.    Report Status 10/17/2015 FINAL  Final  Culture, blood (routine x 2)     Status: None (Preliminary result)   Collection Time: 10/14/15  [name]:51 AM  Result Value Ref Range Status   Specimen Description BLOOD RIGHT HAND  Final   Special Requests BOTTLES DRAWN AEROBIC AND ANAEROBIC  Final   Culture NO GROWTH 3 DAYS  Final   Report Status PENDING  Incomplete    Culture, Urine     Status: None   Collection Time: 10/14/15  [name]:32 PM  Result Value Ref Range Status   Specimen Description URINE, CATHETERIZED  Final   Special Requests NONE  Final   Culture NO GROWTH [name] DAY  Final   Report Status 10/15/2015 FINAL  Final  C difficile quick scan w PCR reflex     Status: None   Collection Time: 10/15/15  4:29 AM  Result Value Ref Range Status   C Diff antigen NEGATIVE NEGATIVE Final   C Diff toxin NEGATIVE NEGATIVE Final   C Diff interpretation Negative for toxigenic C. difficile  Final     Studies:  Recent x-ray studies have been reviewed in detail by the Attending Physician  Scheduled Meds:  Scheduled Meds: . amLODipine  10 mg Per Tube Daily  . antiseptic oral rinse  7 mL Mouth Rinse QID  . chlorhexidine gluconate  15 mL Mouth Rinse BID  . collagenase   Topical Daily  . feeding supplement (VITAL AF [name].2 CAL)  [name],000 mL Per Tube Q24H  . fluconazole (DIFLUCAN) IV  200 mg Intravenous Q24H  . free water  500 mL Per Tube Q3H  . heparin subcutaneous  5,000 Units Subcutaneous 3 times per day  . insulin aspart  0-5 Units Subcutaneous QHS  . insulin aspart  0-9 Units Subcutaneous TID WC  . levothyroxine  100 mcg Per Tube QAC breakfast  . metoprolol tartrate  100 mg Oral BID  . oxcarbazepine  600 mg Per Tube BID  . pantoprazole sodium  40 mg Per Tube Daily  . sodium chloride  10-40 mL Intracatheter Q12H  . ziprasidone  80 mg Oral BID WC    Time spent on care of this patient: 40 mins   WOODS, Roselind Messier , MD  [facility] Office  [phone] Pager [phone]  On-Call/Text Page:      [email or web address]      password [email or web address]  If 7PM-7AM, please contact night-coverage [email or web address] Password [email or web address] [name]/[name]/2017, [name]:53 PM   LOS: 14 days   Care during the described time interval was provided by me .  I have reviewed this patient's available data, including medical history, events of note, physical examination, and all test results as part of my  evaluation. I have personally reviewed and interpreted all radiology studies.   Carolyne Littles, MD [phone] Pager

## 2015-10-19 LAB — CBC WITH DIFFERENTIAL/PLATELET
BASOS ABS: 0 10*3/uL (ref 0.0–0.1)
BASOS PCT: 0 %
EOS ABS: 0.1 10*3/uL (ref 0.0–0.7)
Eosinophils Relative: 1 %
HCT: 30.6 % — ABNORMAL LOW (ref 39.0–52.0)
Hemoglobin: 8.9 g/dL — ABNORMAL LOW (ref 13.0–17.0)
Lymphocytes Relative: 15 %
Lymphs Abs: 1.7 10*3/uL (ref 0.7–4.0)
MCH: 26.9 pg (ref 26.0–34.0)
MCHC: 29.1 g/dL — AB (ref 30.0–36.0)
MCV: 92.4 fL (ref 78.0–100.0)
MONO ABS: 0.5 10*3/uL (ref 0.1–1.0)
MONOS PCT: 4 %
Neutro Abs: 9 10*3/uL — ABNORMAL HIGH (ref 1.7–7.7)
Neutrophils Relative %: 80 %
PLATELETS: 327 10*3/uL (ref 150–400)
RBC: 3.31 MIL/uL — ABNORMAL LOW (ref 4.22–5.81)
RDW: 17.2 % — AB (ref 11.5–15.5)
WBC: 11.4 10*3/uL — ABNORMAL HIGH (ref 4.0–10.5)

## 2015-10-19 LAB — CULTURE, BLOOD (ROUTINE X 2): Culture: NO GROWTH

## 2015-10-19 LAB — COMPREHENSIVE METABOLIC PANEL
ALBUMIN: 2.3 g/dL — AB (ref 3.5–5.0)
ALK PHOS: 60 U/L (ref 38–126)
ALT: 93 U/L — AB (ref 17–63)
ANION GAP: 11 (ref 5–15)
AST: 35 U/L (ref 15–41)
BILIRUBIN TOTAL: 0.5 mg/dL (ref 0.3–1.2)
BUN: 39 mg/dL — ABNORMAL HIGH (ref 6–20)
CALCIUM: 8 mg/dL — AB (ref 8.9–10.3)
CO2: 28 mmol/L (ref 22–32)
CREATININE: 1.51 mg/dL — AB (ref 0.61–1.24)
Chloride: 109 mmol/L (ref 101–111)
GFR calc non Af Amer: 51 mL/min — ABNORMAL LOW (ref >60)
GFR, EST AFRICAN AMERICAN: 59 mL/min — AB (ref >60)
GLUCOSE: 141 mg/dL — AB (ref 65–99)
Potassium: 3.1 mmol/L — ABNORMAL LOW (ref 3.5–5.1)
SODIUM: 148 mmol/L — AB (ref 135–145)
TOTAL PROTEIN: 6 g/dL — AB (ref 6.5–8.1)

## 2015-10-19 LAB — GLUCOSE, CAPILLARY
GLUCOSE-CAPILLARY: 121 mg/dL — AB (ref 65–99)
GLUCOSE-CAPILLARY: 105 mg/dL — AB (ref 65–99)
GLUCOSE-CAPILLARY: 116 mg/dL — AB (ref 65–99)
Glucose-Capillary: 117 mg/dL — ABNORMAL HIGH (ref 65–99)

## 2015-10-19 LAB — MAGNESIUM: MAGNESIUM: 2.1 mg/dL (ref 1.7–2.4)

## 2015-10-19 MED ORDER — FREE WATER
500.0000 mL | Status: DC
  Administered 2015-10-19 – 2015-10-20 (×6): 500 mL

## 2015-10-19 MED ORDER — VITAL AF 1.2 CAL PO LIQD
1000.0000 mL | ORAL | Status: DC
  Administered 2015-10-19: 1000 mL
  Filled 2015-10-19 (×2): qty 1000

## 2015-10-19 MED ORDER — ACETAMINOPHEN 160 MG/5ML PO SOLN: 650.0000 mg | Freq: Four times a day (QID) | ORAL | Status: DC | PRN

## 2015-10-19 MED ORDER — VITAL AF 1.2 CAL PO LIQD
1000.0000 mL | ORAL | Status: DC
  Filled 2015-10-19: qty 1000

## 2015-10-19 MED ORDER — PANTOPRAZOLE SODIUM 40 MG PO TBEC
40.0000 mg | DELAYED_RELEASE_TABLET | Freq: Every day | ORAL | Status: DC
  Administered 2015-10-20: 40 mg via ORAL
  Filled 2015-10-19: qty 1

## 2015-10-19 MED ORDER — OXCARBAZEPINE 300 MG PO TABS
600.0000 mg | ORAL_TABLET | Freq: Two times a day (BID) | ORAL | Status: DC
  Administered 2015-10-19 – 2015-10-20 (×2): 600 mg via ORAL
  Filled 2015-10-19 (×3): qty 2

## 2015-10-19 MED ORDER — AMLODIPINE BESYLATE 10 MG PO TABS
10.0000 mg | ORAL_TABLET | Freq: Every day | ORAL | Status: DC
  Administered 2015-10-20: 10 mg via ORAL
  Filled 2015-10-19: qty 1

## 2015-10-19 MED ORDER — LEVOTHYROXINE SODIUM 100 MCG PO TABS
100.0000 ug | ORAL_TABLET | Freq: Every day | ORAL | Status: DC
  Administered 2015-10-20: 100 ug via ORAL
  Filled 2015-10-19: qty 1

## 2015-10-19 MED ORDER — POTASSIUM CHLORIDE 20 MEQ/15ML (10%) PO SOLN
40.0000 meq | Freq: Once | ORAL | Status: AC
  Administered 2015-10-19: 40 meq
  Filled 2015-10-19: qty 30

## 2015-10-19 NOTE — Progress Notes (Signed)
PULMONARY / CRITICAL CARE MEDICINE   Name: Adrian [name]. MRN: [id number] DOB: [birth year]    ADMISSION DATE:  10/04/2015  REFERRING MD:  [facility]  CHIEF COMPLAINT: SOB  HISTORY OF PRESENT ILLNESS:   55 y/o who presented  to [facility] 12/16 with SOB @  527 lbs and has OSA, also has drainage from left leg and panus consistent with cellulitis.  SUBJECTIVE/overnight:    No issues. Tolerating TM and PMV.  VITAL SIGNS: BP 154/70 mmHg  Pulse 90  Temp(Src) 98.4 F (36.9 C) (Oral)  Resp 18  Ht 5\' 11"  (1.803 m)  Wt 427 lb 11.1 oz (194 kg)  BMI 59.68 kg/m2  SpO2 97%  HEMODYNAMICS:    VENTILATOR SETTINGS: Vent Mode:  [-]  FiO2 (%):  [40 %] 40 %  INTAKE / OUTPUT: I/O last 3 completed shifts: In: 5680 [P.O.:840; I.V.:2840; Other:1500; NG/GT:500] Out: 4177 [Urine:4175; Stool:2]  PHYSICAL EXAMINATION:  Gen: Morbid obesity, no distress Lungs: Clear, no wheeze, crackles CV: Regular rate and rhythm, no MRG GI: Obese, soft, nontender, BS+.  Neuro: awake and alert, answering questions and following commands Skin: massive panus, chronic appearing wound well dressed in panus, no drainage, fungal appearing erythema under panus in fold   LABS:  BMET  Recent Labs Lab 10/18/15 0633 10/18/15 1454 10/19/15 0445  NA 152* 152* 148*  K 3.3* 3.2* 3.1*  CL 110 111 109  CO2 29 29 28   BUN 47* 44* 39*  CREATININE 1.55* 1.74* 1.51*  GLUCOSE 129* 134* 141*   Electrolytes  Recent Labs Lab 10/17/15 0407  10/18/15 0633 10/18/15 1454 10/19/15 0445  CALCIUM QUANTITY NOT SUFFICIENT, UNABLE TO PERFORM TEST  < > 8.4* 8.1* 8.0*  MG QUANTITY NOT SUFFICIENT, UNABLE TO PERFORM TEST  --  2.2  --  2.1  < > = values in this interval not displayed.   CBC  Recent Labs Lab 10/17/15 0407 10/18/15 0633 10/19/15 0445  WBC 11.2* 11.6* 11.4*  HGB 9.9* 9.2* 8.9*  HCT 32.0* 31.1* 30.6*  PLT 327 364 327   Coag's No results for input(s): APTT, INR in the last 168 hours.   Sepsis Markers No  results for input(s): LATICACIDVEN, PROCALCITON, O2SATVEN in the last 168 hours.   ABG No results for input(s): PHART, PCO2ART, PO2ART in the last 168 hours. Liver Enzymes  Recent Labs Lab 10/17/15 0730 10/18/15 0633 10/19/15 0445  AST 90* 46* 35  ALT 151* 119* 93*  ALKPHOS 73 67 60  BILITOT 1.8* 0.6 0.5  ALBUMIN 2.6* 2.5* 2.3*   Cardiac Enzymes No results for input(s): TROPONINI, PROBNP in the last 168 hours. Glucose  Recent Labs Lab 10/17/15 2108 10/18/15 0827 10/18/15 1318 10/18/15 1702 10/18/15 2130 10/19/15 0828  GLUCAP 127* 124* 134* 129* 140* 121*   Imaging No results found.   STUDIES:  12/23 TEE with no vegetations  CULTURES: 12/16  bc [facility]) >> Strep group g 2 of 2 >> levaquin sensitive 12/16  [facility]) >> multiple species 12/18  urine >> negative 12/19  Resp >> moderate candida 12/18  Blood >> Coag negative staph  12/21  Blood >> neg 12/28  Blood >> gram + cocci in clusters > coag neg staph 12/28  Urine >> negative 12/28  C-diff >> negative 1/1 trach cultures >>>  ANTIBIOTICS: Ceftriaxone 12/17 >> 12/19 Vanco 12/18 >>12/22 Clinda 12/19 >> 12/19 Zosyn 12/19 >>12/22 Levaquin 12/22 >> 12/31 Vancomycin 12/28> 12/29  Diflucan 12/30 >>>  SIGNIFICANT EVENTS: 12/16  admit to [facility] with  panus infection 12/18  [facility]  Tracheostomy 12/25  off vent during daytime 12/27  off vent>TC 12/28  Fever, + blood cultures 12/29  FEES >> mild oral phase dysphagia, mild pharyngeal phase dysphagia, moderate aspiration risk  LINES/TUBES: ETT 12/18 >> 12/23 Tracheostomy Shiley 6 mm Cuffed (JY) 12/23 > 12/30 changed to #6 cuffless and replaced 1/1 (coughed out)  L IJ TLC 12/20 >> 12/26  DISCUSSION: Morbidly obese male with panus and leg infections, sepsis, and hypoxic respiratory failure due to ALI superimposed on OHS +/- sedating meds. Intubated 12/18; tracheostomy 12/23, now with improving oxygenation, remains off  vent and tolerating TC.    ASSESSMENT / PLAN:  PULMONARY A:  ARDS - resolving, progressed to ATC   ?HCAP - resolved Hx of asthma  Acute pulmonary edema with total body volume overload Obstructive sleep apnea - resolved with trach Obesity hypoventilation syndrome - currently doing well with out vent support P:   Continue PMV trials SLP following Aspiration precautions Trach to #6 cuffless, but do not plan to de-cannulate anytime soon Wean O2 for O2 saturation > 92% Pulmonary hygiene Mobilize, push PT  INFECTIOUS A:   Cellulitis of panus with chronic wound Group G strep bacteremia, TEE negative for vegetations HCAP / aspiration PNA; CXR 12/28 without  infiltrates Fever and leukocytosis; C-Diff negative; urine culture negative; blood cultures with gram positive cocci in clusters P:   Finished Levaquin. Appreciate WOC consult  Trend WBC / fever curve - recheck sputum 1/1  Rest of treatment plan per [facility] PCCM to follow for respiratory status/trach  FAMILY  - Updates: Patient updated at bedside.   Chilton GreathousePraveen Wirt Hemmerich MD [place] [facility] Pager [phone] If no answer or after 3pm call: [phone] 10/19/2015, 11:19 AM

## 2015-10-19 NOTE — Progress Notes (Signed)
Nutrition Follow-up  DOCUMENTATION CODES:   Morbid obesity  INTERVENTION:   Re-start nocturnal TF regimen -- Vital AF 1.2 formula at 80 ml/hr from 7 PM to 7 AM. Total time is 12 hours. Continue until PO intake improves.   TF regimen to provide 1152 kcals, 72 gm protein, 777 ml of free water   NUTRITION DIAGNOSIS:   Inadequate oral intake related to dysphagia as evidenced by meal completion < 50%.  Ongoing  GOAL:   Patient will meet greater than or equal to 90% of their needs  Unmet  MONITOR:   PO intake, Supplement acceptance, Labs, Weight trends, I & O's  REASON FOR ASSESSMENT:   Consult Assessment of nutrition requirement/status  ASSESSMENT:   55 year old, 514 lb male with panus and leg infections, sepsis. Hypoxic resp failure due to ALI. Intubated 12/18.   RD re-consulted: "This pt has been advanced to Dys diet. Is he to have parallel nighttime tube feeding. Need Nursing orders clarified". Pt asleep at time of visit. He continues to have Cortrak tube in place. Per nursing notes, pt is only eating 5-10% of meals. Per SLP note, pt does not like eggs or sausage in at the hospital; pt is not meeting nutritional status by oral only. Per nursing notes, TF's were stopped on 12/30. Tube feedings are now reordered to provide Vital AF 1.2 @ 50 ml/hr from 7 pm to 7 am. Pt receiving 500 ml free water flushes 6 times daily to provide 3000 ml of fluid.   Labs: elevated sodium, low potassium, low calcium, low hemoglobin  Diet Order:  DIET DYS 2 Room service appropriate?: Yes; Fluid consistency:: Thin  Skin:  Wound (see comment) (ulcer on L foot; weeping of lower abd; medial glutealfissure)  Last BM:  12/31  Height:   Ht Readings from Last 1 Encounters:  10/13/15 5\' 11"  (1.803 m)    Weight:   Wt Readings from Last 1 Encounters:  10/19/15 427 lb 11.1 oz (194 kg)    Ideal Body Weight:  78.2 kg  BMI:  Body mass index is 59.68 kg/(m^2).  Estimated Nutritional Needs:    Kcal:  2000-2200  Protein:  125-135 gm  Fluid:  2.0-2.2 L  EDUCATION NEEDS:   Education needs addressed  Dorothea Ogleeanne Arin Vanosdol RD, LDN Inpatient Clinical Dietitian Pager: [phone] After Hours Pager: [phone]

## 2015-10-19 NOTE — Progress Notes (Signed)
Physical Therapy Treatment Patient Details Name: Adrian [name]. MRN: [id number] DOB: 08-Dec-1960 Today's Date: 10/19/2015    History of Present Illness 55 yo PMH: morbid obesity (527 lbs), HTN, hyperlipidemia, DM, bipolar, OSA, drainage from left leg consistent cellulitis admitted from [facility] 12/16 where he presented with SOB. Found to have strep,with general decline and transported to [facility] ICU. Intubated 12/29, trach 12/23 vent, started trach collar 12/26.     PT Comments    Pt admitted with above diagnosis. Pt currently with functional limitations due to balance, endurance and strength  deficits. Pt continues to not be able to assist much with mobility needing +3 assist just to move a little in the bed.  Attempts to EOB futile.  Pt may be approaching baseline status but will continue to try and progress pt as able.   Pt will benefit from skilled PT to increase their independence and safety with mobility to allow discharge to the venue listed below.    Follow Up Recommendations  LTACH;Supervision/Assistance - 24 hour     Equipment Recommendations  None recommended by PT    Recommendations for Other Services OT consult     Precautions / Restrictions Precautions Precautions: Fall Precaution Comments: trach, rectal tube, NG tube Restrictions Weight Bearing Restrictions: No    Mobility  Bed Mobility Overal bed mobility: Needs Assistance;+2 for physical assistance (+3 or more)             General bed mobility comments: Attempts to get pt to EOB futile with pt unable to roll and unable to move LES due to stiffness and body habitus.  pt unable to grasp rail for assist with rolling. Attempted to guide pt's right hand to head board for assist with scooting up in bed but pt unable to hold on. Tot A +3 for 6" scoot up in bed  Transfers                 General transfer comment: unable to transfer at this time  Ambulation/Gait             General Gait Details:  unable   Stairs            Wheelchair Mobility    Modified Rankin (Stroke Patients Only)       Balance                                    Cognition Arousal/Alertness: Awake/alert Behavior During Therapy: Flat affect Overall Cognitive Status: Impaired/Different from baseline Area of Impairment: Orientation;Memory;Following commands;Problem solving Orientation Level: Disoriented to;Situation;Time   Memory: Decreased short-term memory Following Commands: Follows one step commands inconsistently;Follows one step commands with increased time     Problem Solving: Slow processing;Decreased initiation;Requires tactile cues;Requires verbal cues      Exercises General Exercises - Upper Extremity Shoulder Flexion: AAROM;Both;5 reps;Supine Shoulder ABduction: AAROM;Both;5 reps;Supine Shoulder Horizontal ABduction: AAROM;Both;5 reps;Supine General Exercises - Lower Extremity Ankle Circles/Pumps: AROM;Both;20 reps;Supine Heel Slides: AAROM;Both;10 reps;Supine Hip ABduction/ADduction: AAROM;Both;5 reps;Supine Straight Leg Raises: AAROM;Both;5 reps;Supine    General Comments General comments (skin integrity, edema, etc.): Placed bed in chair position upon departure.      Pertinent Vitals/Pain Pain Assessment: Faces Faces Pain Scale: Hurts little more Pain Location: shoulders with movement Pain Descriptors / Indicators: Aching Pain Intervention(s): Limited activity within patient's tolerance;Monitored during session;Repositioned  VSS on 40% trach collar.    Home Living  Prior Function            PT Goals (current goals can now be found in the care plan section) Progress towards PT goals: Progressing toward goals    Frequency  Min 2X/week    PT Plan Current plan remains appropriate    Co-evaluation             End of Session Equipment Utilized During Treatment: Gait belt;Oxygen (trach collar at 40%) Activity  Tolerance: Patient limited by lethargy;Patient limited by fatigue Patient left: in bed;with call bell/phone within reach     Time: 1028-1043 PT Time Calculation (min) (ACUTE ONLY): 15 min  Charges:  $Therapeutic Activity: 8-22 mins                    G CodesBerline Lopes:      Adrian [name] 10/19/2015, 3:17 PM Entergy CorporationDawn Lina Hitch,PT Acute Rehabilitation [phone] (pager)

## 2015-10-19 NOTE — Progress Notes (Addendum)
[place] TEAM 1 - Stepdown/ICU TEAM PROGRESS NOTE  Lieutenant Diego. YNW:[id number] DOB: [birth year] DOA: 10/04/2015 PCP: No PCP Per Patient  Admit HPI / Brief Narrative: 55 yo who presentedto [facility] 12/16 with SOB.  He is 527 lbs and has OSA.  He was noted to have drainage from left leg and panus consistent with cellulitis.  Significant Events: 12/16 admit to [facility] with panus infection 12/18 Transfer to [facility] due to no ICU nurses at [facility] 12/23 Tracheostomy 12/23 TEE - normal LV fxn - no vegetation 12/25 off vent during daytime 12/27 off vent > TC  HPI/Subjective: The patient is alert and conversant.  He presently denies any complaints.  He denies significant shortness of breath at rest, chest pain, nausea vomiting, or abdominal pain.  Though he has been cleared for a diet the staff has noted that he is eating very little due to not liking the food.  Assessment/Plan:  Cellulitis of panus with chronic wound - Group G strep bacteremia TEE negative for vegetations - completed 14 days of levaquin -   Coag neg Staph in 1 of 2 blood cx 12/28 AND 12/18 Worrisome this has been isolated twice, but still likely a simple contaminant w/ his body habitus making it challenging to obtain a peripheral blood sample - follow w/o directed abx for now - of note at time of last draw (12/28) pt was still on levaquin   ARDS - new Trach (12/23) continuing to improve - PCCM following trach/pulm issues for now  Candida Albicans PNA? Diflucan IV 200 mg daily prescribed 14 days - ?if this is a true pathogen given lower colony count and stable pulm status - stop diflucan for now and follow clinically   Dysphagia Cleared for D2 thin liquid diet, but intake is very limited - resume nocturnal tube feeds until intake improved - follow for GERD/regurg   Hx of asthma  No evidence of wheezing today   Acute pulmonary edema with total body volume overload Net negative ~14L - follow Is/Os and daily  weights  Hypernatremia Improving w/ free water administration    Obstructive sleep apnea / Obesity hypoventilation  Now s/p trach w/ ongoing trach care per PCCM - off vent w/ TC only   HTN Remains poorly controlled - adjust medical tx again and follow   Super morbid obesity - Body mass index is 59.68 kg/(m^2).   Bipolar D/O  DM Fairly well controlled at this time - A1c 5.9 12/16  Hypothyroid TSH 4.5 12/16 - synthroid increased to  MRSA screen +  Code Status: FULL Family Communication: no family present at time of exam Disposition Plan: SDU   Consultants: PCCM  Antibiotics: Ceftriaxone 12/17 > 12/19 Vanco 12/18 > 12/22 Clinda 12/19 > 12/19 Zosyn 12/19 > 12/22 Levaquin 12/22 > 12/31 Diflucan 12/30 > 10/19/2015  DVT prophylaxis: SQ heparin   Objective: Blood pressure 154/70, pulse 90, temperature 98.4 F (36.9 C), temperature source Oral, resp. rate 18, height 5\' 11"  (1.803 m), weight 194 kg (427 lb 11.1 oz), SpO2 97 %.  Intake/Output Summary (Last 24 hours) at 10/19/15 1133 Last data filed at 10/19/15 1100  Gross per 24 hour  Intake   2655 ml  Output   3525 ml  Net   -870 ml   Exam: General: No acute respiratory distress - alert  Lungs: very distant breath sounds th/o - no wheeze  Cardiovascular: Regular rate and rhythm - very distant heart sounds  Abdomen: severe obesity w/ large pannus,  soft, bowel sounds positive Extremities: No significant cyanosis, or clubbing;  3+ edema bilateral lower extremities  Data Reviewed: Basic Metabolic Panel:  Recent Labs Lab 10/15/15 0302 10/16/15 1235 10/17/15 0407 10/17/15 0730 10/18/15 0633 10/18/15 1454 10/19/15 0445  NA 163* 154* QUANTITY NOT SUFFICIENT, UNABLE TO PERFORM TEST 152* 152* 152* 148*  K 4.1 3.8 QUANTITY NOT SUFFICIENT, UNABLE TO PERFORM TEST 5.1 3.3* 3.2* 3.1*  CL 117* 111 QUANTITY NOT SUFFICIENT, UNABLE TO PERFORM TEST 109 110 111 109  CO2 32 30 QUANTITY NOT SUFFICIENT, UNABLE TO PERFORM  TEST 30 29 29 28   GLUCOSE 128* 131* 119* 135* 129* 134* 141*  BUN 59* 51* QUANTITY NOT SUFFICIENT, UNABLE TO PERFORM TEST 48* 47* 44* 39*  CREATININE 1.44* 1.64* 1.61* 1.71* 1.55* 1.74* 1.51*  CALCIUM 9.3 8.5* QUANTITY NOT SUFFICIENT, UNABLE TO PERFORM TEST 8.9 8.4* 8.1* 8.0*  MG 2.8* 2.3 QUANTITY NOT SUFFICIENT, UNABLE TO PERFORM TEST  --  2.2  --  2.1    CBC:  Recent Labs Lab 10/13/15 0230  10/15/15 0302 10/16/15 0820 10/17/15 0407 10/18/15 0633 10/19/15 0445  WBC 16.4*  < > 11.4* 9.9 11.2* 11.6* 11.4*  NEUTROABS 13.2*  --   --  7.1  --  9.1* 9.0*  HGB 9.0*  < > 9.9* 9.2* 9.9* 9.2* 8.9*  HCT 30.8*  < > 34.0* 31.3* 32.0* 31.1* 30.6*  MCV 92.2  < > 95.5 93.7 91.7 92.3 92.4  PLT 456*  < > 362 355 327 364 327  < > = values in this interval not displayed.  Liver Function Tests:  Recent Labs Lab 10/16/15 1235 10/17/15 0407 10/17/15 0730 10/18/15 0633 10/19/15 0445  AST 75* 80* 90* 46* 35  ALT 149* 151* 151* 119* 93*  ALKPHOS 74 74 73 67 60  BILITOT 0.5 0.6 1.8* 0.6 0.5  PROT 6.4* 6.6 6.4* 5.9* 6.0*  ALBUMIN 2.6* 2.7* 2.6* 2.5* 2.3*    CBG:  Recent Labs Lab 10/18/15 0827 10/18/15 1318 10/18/15 1702 10/18/15 2130 10/19/15 0828  GLUCAP 124* 134* 129* 140* 121*    Recent Results (from the past 240 hour(s))  Culture, blood (routine x 2)     Status: None   Collection Time: 10/14/15  1:44 AM  Result Value Ref Range Status   Specimen Description BLOOD LEFT HAND  Final   Special Requests BOTTLES DRAWN AEROBIC AND ANAEROBIC  Final   Culture  Setup Time   Final    GRAM POSITIVE COCCI IN CLUSTERS IN BOTH AEROBIC AND ANAEROBIC BOTTLES CRITICAL RESULT CALLED TO, READ BACK BY AND VERIFIED WITH: B REAP RN 2314 10/14/15 A BROWNING    Culture   Final    STAPHYLOCOCCUS SPECIES (COAGULASE NEGATIVE) THE SIGNIFICANCE OF ISOLATING THIS ORGANISM FROM A SINGLE SET OF BLOOD CULTURES WHEN MULTIPLE SETS ARE DRAWN IS UNCERTAIN. PLEASE NOTIFY THE MICROBIOLOGY DEPARTMENT WITHIN  ONE WEEK IF SPECIATION AND SENSITIVITIES ARE REQUIRED.    Report Status 10/17/2015 FINAL  Final  Culture, blood (routine x 2)     Status: None   Collection Time: 10/14/15  1:51 AM  Result Value Ref Range Status   Specimen Description BLOOD RIGHT HAND  Final   Special Requests BOTTLES DRAWN AEROBIC AND ANAEROBIC  Final   Culture NO GROWTH 5 DAYS  Final   Report Status 10/19/2015 FINAL  Final  Culture, Urine     Status: None   Collection Time: 10/14/15  1:32 PM  Result Value Ref Range Status   Specimen Description  URINE, CATHETERIZED  Final   Special Requests NONE  Final   Culture NO GROWTH 1 DAY  Final   Report Status 10/15/2015 FINAL  Final  C difficile quick scan w PCR reflex     Status: None   Collection Time: 10/15/15  4:29 AM  Result Value Ref Range Status   C Diff antigen NEGATIVE NEGATIVE Final   C Diff toxin NEGATIVE NEGATIVE Final   C Diff interpretation Negative for toxigenic C. difficile  Final     Studies:   Recent x-ray studies have been reviewed in detail by the Attending Physician  Scheduled Meds:  Scheduled Meds: . amLODipine  10 mg Per Tube Daily  . antiseptic oral rinse  7 mL Mouth Rinse QID  . chlorhexidine gluconate  15 mL Mouth Rinse BID  . collagenase   Topical Daily  . feeding supplement (VITAL AF 1.2 CAL)  1,000 mL Per Tube Q24H  . fluconazole (DIFLUCAN) IV  200 mg Intravenous Q24H  . free water  500 mL Per Tube Q3H  . heparin subcutaneous  5,000 Units Subcutaneous 3 times per day  . insulin aspart  0-5 Units Subcutaneous QHS  . insulin aspart  0-9 Units Subcutaneous TID WC  . levothyroxine  100 mcg Per Tube QAC breakfast  . metoprolol tartrate  100 mg Oral BID  . oxcarbazepine  600 mg Per Tube BID  . pantoprazole sodium  40 mg Per Tube Daily  . sodium chloride  10-40 mL Intracatheter Q12H  . ziprasidone  80 mg Oral BID WC    Time spent on care of this patient: 35 mins   MCCLUNG,JEFFREY T , MD   [facility] Office   [phone] Pager - Text Page per Loretha StaplerAmion as per below:  On-Call/Text Page:      [email or web address]      password [email or web address]  If 7PM-7AM, please contact night-coverage [email or web address] Password [email or web address] 10/19/2015, 11:33 AM   LOS: 15 days

## 2015-10-19 NOTE — Progress Notes (Signed)
Speech Language Pathology Treatment: Dysphagia;Passy Muir Speaking valve  Patient Details Name: Adrian DiegoCarlton B Ashford Jr. MRN: [id number] DOB: 11-Jan-1961 Today's Date: 10/19/2015 Time: [id number] SLP Time Calculation (min) (ACUTE ONLY): 25 min  Assessment / Plan / Recommendation Clinical Impression  Limited observation due to pt having finished breakfast when SLP arrived. No indications of aspiration with magic cup and thin Diet Sprite via straw with min verbal cues for small sips. Pt stated he does not like the eggs or sausage at the hospital. Per RN and RD's note, tube feedings were nocturnal until pt began to vomit, tube feedings held and have not been restarted. SLP suspects he is not meeting nutritional status by oral only. Paged RD (no answer); recommended to Dr. Sharon SellerMcClung to consider re-initiating tube feeds (noctornul per prior order). RN asking if pt continued to need full supervision. SLP recommends intermittent supervision for swallow, however left hand appears weak, edematous and will require set up and possibly feeding assist. Requested RN to position head of bed higher and place in reverse trendelenburg during meals. Wear valve with all meals.  PMSV donned when SLP arrived and placed leash to trach and valve (trach now cuffless). 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 Vitals were within normal range, speech intelligible without air trapping and moderate decreased respiratory support. SLP will continue treatment.   HPI HPI: 55 yo PMH: morbid obesity (527 lbs), HTN, hyperlipidemia, DM, bipolar, OSA, drainage from left leg consistent cellulitis admitted from  [facility] 12/16 where he presented with SOB. Found to have strep,with general decline and [facility] Intubated 12/19, trach 12/23 vent, started trach collar 12/26.      SLP Plan  Continue with current plan of care     Recommendations  Diet recommendations: Dysphagia 2 (fine chop);Thin liquid Liquids provided via:  Cup;Straw Medication Administration: Crushed with puree Supervision: Patient able to self feed;Intermittent supervision to cue for compensatory strategies (pt needs set up and mild assist for feeding) Compensations: Minimize environmental distractions;Small sips/bites;Slow rate;Follow solids with liquid;Other (Comment) Postural Changes and/or Swallow Maneuvers: Upright 30-60 min after meal;Seated upright 90 degrees      Patient may use Passy-Muir Speech Valve: During all therapies with supervision;During PO intake/meals;Intermittently with supervision PMSV Supervision: Full       Oral Care Recommendations: Oral care BID Follow up Recommendations: LTACH Plan: Continue with current plan of care   Royce MacadamiaLitaker, Kyarah Enamorado Willis 10/19/2015, 9:40 AM   Breck CoonsLisa Willis Lonell FaceLitaker M.Ed [employer]-SLP Pager [phone]

## 2015-10-20 ENCOUNTER — Inpatient Hospital Stay (HOSPITAL_COMMUNITY): Payer: Medicare Other

## 2015-10-20 LAB — COMPREHENSIVE METABOLIC PANEL
ALBUMIN: 2.3 g/dL — AB (ref 3.5–5.0)
ALK PHOS: 64 U/L (ref 38–126)
ALT: 68 U/L — ABNORMAL HIGH (ref 17–63)
AST: 24 U/L (ref 15–41)
Anion gap: 11 (ref 5–15)
BILIRUBIN TOTAL: 0.5 mg/dL (ref 0.3–1.2)
BUN: 34 mg/dL — AB (ref 6–20)
CALCIUM: 8.3 mg/dL — AB (ref 8.9–10.3)
CO2: 28 mmol/L (ref 22–32)
Chloride: 106 mmol/L (ref 101–111)
Creatinine, Ser: 1.32 mg/dL — ABNORMAL HIGH (ref 0.61–1.24)
GFR calc Af Amer: >60 mL/min (ref >60)
GFR calc non Af Amer: 60 mL/min — ABNORMAL LOW (ref >60)
GLUCOSE: 136 mg/dL — AB (ref 65–99)
Potassium: 3 mmol/L — ABNORMAL LOW (ref 3.5–5.1)
Sodium: 145 mmol/L (ref 135–145)
TOTAL PROTEIN: 6.3 g/dL — AB (ref 6.5–8.1)

## 2015-10-20 LAB — CULTURE, RESPIRATORY: SPECIAL REQUESTS: NORMAL

## 2015-10-20 LAB — CBC WITH DIFFERENTIAL/PLATELET
BASOS ABS: 0 10*3/uL (ref 0.0–0.1)
BASOS PCT: 0 %
Eosinophils Absolute: 0.2 10*3/uL (ref 0.0–0.7)
Eosinophils Relative: 2 %
HEMATOCRIT: 29.2 % — AB (ref 39.0–52.0)
Hemoglobin: 8.9 g/dL — ABNORMAL LOW (ref 13.0–17.0)
Lymphocytes Relative: 14 %
Lymphs Abs: 1.4 10*3/uL (ref 0.7–4.0)
MCH: 27.8 pg (ref 26.0–34.0)
MCHC: 30.5 g/dL (ref 30.0–36.0)
MCV: 91.3 fL (ref 78.0–100.0)
MONOS PCT: 4 %
Monocytes Absolute: 0.4 10*3/uL (ref 0.1–1.0)
NEUTROS ABS: 7.5 10*3/uL (ref 1.7–7.7)
NEUTROS PCT: 80 %
Platelets: 297 10*3/uL (ref 150–400)
RBC: 3.2 MIL/uL — AB (ref 4.22–5.81)
RDW: 16.8 % — ABNORMAL HIGH (ref 11.5–15.5)
WBC: 9.4 10*3/uL (ref 4.0–10.5)

## 2015-10-20 LAB — GLUCOSE, CAPILLARY
GLUCOSE-CAPILLARY: 139 mg/dL — AB (ref 65–99)
Glucose-Capillary: 140 mg/dL — ABNORMAL HIGH (ref 65–99)
Glucose-Capillary: 122 mg/dL — ABNORMAL HIGH (ref 65–99)

## 2015-10-20 LAB — CULTURE, RESPIRATORY W GRAM STAIN

## 2015-10-20 MED ORDER — LEVOTHYROXINE SODIUM 100 MCG PO TABS: 100.0000 ug | ORAL_TABLET | Freq: Every day | ORAL | Status: DC

## 2015-10-20 MED ORDER — VITAL AF 1.2 CAL PO LIQD: 1000.0000 mL | ORAL | Status: DC

## 2015-10-20 MED ORDER — INSULIN ASPART 100 UNIT/ML ~~LOC~~ SOLN: 0.0000 [IU] | Freq: Three times a day (TID) | SUBCUTANEOUS | Status: DC

## 2015-10-20 MED ORDER — FREE WATER: 500.0000 mL | Status: DC

## 2015-10-20 MED ORDER — COLLAGENASE 250 UNIT/GM EX OINT: TOPICAL_OINTMENT | Freq: Every day | CUTANEOUS | Status: DC

## 2015-10-20 MED ORDER — AMLODIPINE BESYLATE 10 MG PO TABS: 10.0000 mg | ORAL_TABLET | Freq: Every day | ORAL | Status: DC

## 2015-10-20 MED ORDER — PANTOPRAZOLE SODIUM 40 MG PO TBEC
40.0000 mg | DELAYED_RELEASE_TABLET | Freq: Every day | ORAL | Status: AC
Start: 2015-10-20 — End: ?

## 2015-10-20 MED ORDER — METOPROLOL TARTRATE 100 MG PO TABS: 100.0000 mg | ORAL_TABLET | Freq: Two times a day (BID) | ORAL | Status: DC

## 2015-10-20 MED ORDER — INSULIN ASPART 100 UNIT/ML ~~LOC~~ SOLN: 0.0000 [IU] | Freq: Every day | SUBCUTANEOUS | Status: DC

## 2015-10-20 MED ORDER — POTASSIUM CHLORIDE 10 MEQ/100ML IV SOLN
10.0000 meq | INTRAVENOUS | Status: AC
  Administered 2015-10-20 (×5): 10 meq via INTRAVENOUS
  Filled 2015-10-20 (×2): qty 100

## 2015-10-20 NOTE — Progress Notes (Signed)
Attempted to suction patient and unable to advance the catheter through the trach. Trach care performed. Patient not having signs or symptoms of respiratory distress. O2 sats high 90's on TC 40%. RT notified and at bedside. MD notified and ELINK camera on in room. PCCM MD to come to bedside.  M.Kyndel Egger, RN

## 2015-10-20 NOTE — Progress Notes (Addendum)
Checked trach placement with ETCO2, no color change noted. Dr. Isaiah SergeMannam paged and notified. No obvious respiratory distress noted at this time.

## 2015-10-20 NOTE — Progress Notes (Signed)
Family Lidia CollumFriend [name] Menz visited today:  Cell [phone] [phone]. She shared with RN That patient's mother Glena Norfolk(Polly)  is at "[employer]" [phone]

## 2015-10-20 NOTE — Discharge Summary (Signed)
Physician Discharge Summary  Adrian [name]. [name] DOB: March 22, 1961 DOA: 10/04/2015  PCP: No PCP Per Patient  Admit date: 10/04/2015 Discharge date: 10/20/2015  Time spent: 40 minutes  Recommendations for Outpatient Follow-up:  Cellulitis of panus with chronic wound (abdominal wall cellulitis)/ Group G strep bacteremia -TEE negative for vegetations - Completed 14 days of levaquin  -Afebrile overnight  ARDS/positive moderate Candida Albicans PNA continuing to improve - PCCM following trach/pulm issues for now  -12/29 trach changed to a #6 cuffless  -Diflucan IV 200 mg daily 14 days  Dysphagia -Passed swallow study;Dysphagia 2 (fine chop);Thin liquid -Continue Carb modified 2000-calorie/day diet  Hx of asthma  -No evidence of wheezing today   Acute pulmonary edema with total body volume overload -Strict in and out; since admission - 12.0 L  -Daily weights; admission weight= 233.1 kg 1/3 weight= 187 kg  Hypernatremia -Continue free water via feeding tube to 500 ml q 4 hr   Obstructive sleep apnea / Obesity hypoventilation Syndrome -Now on trach collar with blow-by -1/3 trach replaced with XLT #6 cuffless  HTN -Continue amlodipine 10 mg daily  -Continue Metoprolol 100 mg BID   Super morbid obesity - Body mass index is 61.52 kg/(m^2).  -Continue 2000-calorie/day diabetic diet, per nutrition  Bipolar D/O -Continue Geodon 80 mg BID  DM Type 2 -12/16 hemoglobin A1c= 5.9 -Fairly well controlled at this time   Hypothyroid -12/16 TSH =4.5  -Continue Synthroid to 100 g daily  Hypokalemia -Potassium IV 50 mEq -Will need to monitor electrolytes closely     Discharge Diagnoses:  Active Problems:   Respiratory failure (HCC)   Severe sepsis (HCC)   Acute renal failure (HCC)   Acute respiratory failure (HCC)   ARDS (adult respiratory distress syndrome) (HCC)   Acute respiratory failure with hypoxia (HCC)   Encounter for imaging  study to confirm nasogastric (NG) tube placement   History of ETT   Tracheostomy care (HCC)   C. difficile colitis   Abdominal wall cellulitis   Bacteremia due to Streptococcus   Dysphagia   OSA (obstructive sleep apnea)   Obesity hypoventilation syndrome (HCC)   Bipolar I disorder (HCC)   Other specified hypothyroidism   Candidal pneumonia (HCC)   Hypernatremia   Discharge Condition: Stable  Diet recommendation: Heart healthy/ADA 2000-calorie per day diet  Filed Weights   10/18/15 0311 10/19/15 0445 10/20/15 0349  Weight: 193 kg (425 lb 7.8 oz) 194 kg (427 lb 11.1 oz) 187 kg (412 lb 4.2 oz)    History of present illness:  55 yo WM PMHx MORBIDLY obese (527 pounds), OSA/OHS,  [facility]ed[facility] 12[facility]16 with SOB, drainage from left leg and panus consistent with cellulitis. During his hospitalization patient was treated for his cellulitis. Patient stay was complicated by Candida pneumonia which is currently being treated. In addition patient's morbid obesity has presented unique problems resulting in patient receiving tracheostomy.   Consultants: Dr.Douglas Leandrew Koyanagi [facility] M   Procedure/Significant Events: 12/16 admit to [facility] with panus infection 12/18 Transfer to [facility] due to no ICU nurses at [facility] 12/23 Tracheostomy 12/23 TEE - normal LV fxn - no vegetation 12/25 off vent during daytime 12/27 off vent > TC 1/3 trach replaced with XLT #6 cuffless   Culture 12/16 blood right chest/AC positive group G strep 12/18 MRSA by PCR positive 12/19 blood right hand negative final 12/19 tracheal aspirate positive moderate Candida Albicans 12/21 blood left hand/ AC negative final 12/28 blood left hand positive coag negative staph 12/28 blood  right hand NGTD 12/28 urine negative final 1/1 tracheal aspirate positive few Candida albicans   Antibiotics: Rocephin 12/18>>12/19 Zosyn 12/19>>12/22 Levofloxacin 12/22>>12/31 Vancomycin 12/201 dose : 12/29>>12/30   Diflucan  12/30>>    Discharge Exam: Filed Vitals:   10/20/15 0811 10/20/15 0900 10/20/15 1000 10/20/15 1334  BP: 149/76   153/74  Pulse: 94 91 90 87  Temp: 99.3 F (37.4 C)   99.5 F (37.5 C)  TempSrc: Oral   Oral  Resp: 24 18 20 16   Height:      Weight:      SpO2: 94% 92% 96% 99%    General: A/O 4 sitting comfortably in bed, negative acute respiratory distress, continued chronic respiratory distress requiring tracheostomy  Eyes: Negative headache, negative scleral hemorrhage ENT: Negative Runny nose, negative ear pain, negative gingival bleeding Neck: Negative scars, masses, torticollis, lymphadenopathy, JVD, tracheostomy present negative thick yellow discharge observed today. Lungs: Clear to auscultation bilaterally without wheezes or crackles (extremely difficult to hear secondary to MORBID OBESITY) Cardiovascular: Regular rate and rhythm without murmur gallop or rub normal S1 and S2 (extremely difficult to hear secondary to MORBID OBESITY) Abdomen: negative abdominal pain, nondistended, positive soft, bowel sounds, no rebound, no ascites, no appreciable mass. Midline vertical grade 2 ulceration of his pannus; good granulation tissue, negative discharge.     Discharge Instructions     Medication List    STOP taking these medications        cefTRIAXone 2 g in dextrose 5 % 50 mL     enoxaparin 40 MG/0.4ML injection  Commonly known as:  LOVENOX     gabapentin 300 MG capsule  Commonly known as:  NEURONTIN     lisinopril 5 MG tablet  Commonly known as:  PRINIVIL,ZESTRIL     loratadine 10 MG tablet  Commonly known as:  CLARITIN     metFORMIN 1000 MG tablet  Commonly known as:  GLUCOPHAGE     omeprazole 20 MG capsule  Commonly known as:  PRILOSEC     potassium chloride SA 20 MEQ tablet  Commonly known as:  K-DUR,KLOR-CON     torsemide 20 MG tablet  Commonly known as:  DEMADEX     vancomycin 2,000 mg in sodium chloride 0.9 % 500 mL     verapamil 120 MG CR tablet   Commonly known as:  CALAN-SR     zolpidem 10 MG tablet  Commonly known as:  AMBIEN      TAKE these medications        amLODipine 10 MG tablet  Commonly known as:  NORVASC  Take 1 tablet (10 mg total) by mouth daily.     collagenase ointment  Commonly known as:  SANTYL  Apply topically daily. Apply to affected area daily     feeding supplement (VITAL AF 1.2 CAL) Liqd  Place 1,000 mLs into feeding tube daily.     Fluticasone-Salmeterol 250-50 MCG/DOSE Aepb  Commonly known as:  ADVAIR  Inhale 1 puff into the lungs 2 (two) times daily.     free water Soln  Place 500 mLs into feeding tube every 4 (four) hours.     insulin aspart 100 UNIT/ML injection  Commonly known as:  novoLOG  Inject 0-5 Units into the skin at bedtime.     insulin aspart 100 UNIT/ML injection  Commonly known as:  novoLOG  Inject 0-9 Units into the skin 3 (three) times daily with meals.     levothyroxine 100 MCG tablet  Commonly known as:  SYNTHROID, LEVOTHROID  Take 1 tablet (100 mcg total) by mouth daily before breakfast.     metoprolol 100 MG tablet  Commonly known as:  LOPRESSOR  Take 1 tablet (100 mg total) by mouth 2 (two) times daily.     montelukast 10 MG tablet  Commonly known as:  SINGULAIR  Take 10 mg by mouth daily.     nystatin 100000 UNIT/GM Powd  Apply topically 3 (three) times daily.     oxcarbazepine 600 MG tablet  Commonly known as:  TRILEPTAL  Take 600 mg by mouth 2 (two) times daily.     pantoprazole 40 MG tablet  Commonly known as:  PROTONIX  Take 1 tablet (40 mg total) by mouth daily at 12 noon.     tiotropium 18 MCG inhalation capsule  Commonly known as:  SPIRIVA  Place 18 mcg into inhaler and inhale daily.     ziprasidone 80 MG capsule  Commonly known as:  GEODON  Take 80 mg by mouth 2 (two) times daily with a meal.       No Known Allergies    The results of significant diagnostics from this hospitalization (including imaging, microbiology, ancillary and  laboratory) are listed below for reference.    Significant Diagnostic Studies: Dg Chest 1 View  10/20/2015  CLINICAL DATA:  Acute respiratory failure. EXAM: CHEST 1 VIEW COMPARISON:  October 14, 2015. FINDINGS: Stable cardiomegaly. Right-sided PICC line is noted with distal tip in expected position of the SVC. Tracheostomy tube is in grossly good position. Nasogastric tube is noted proximally, but distal tip is not visualized due to body habitus. No pneumothorax or pleural effusion is noted. No acute pulmonary disease is noted. Bony thorax is intact. IMPRESSION: Stable support apparatus. No acute cardiopulmonary abnormality seen. Electronically Signed   By: Lupita Raider, M.D.   On: 10/20/2015 13:37   Dg Chest 1 View  10/04/2015  CLINICAL DATA:  Shortness of breath, history hypertension, asthma, hyperlipidemia, diabetes mellitus EXAM: CHEST 1 VIEW COMPARISON:  10/02/2015 FINDINGS: Rotated to the LEFT. Enlargement of cardiac silhouette. Diffuse BILATERAL airspace infiltrates RIGHT greater than LEFT question asymmetric edema versus infection. No gross pleural effusion or pneumothorax. Osseous structures inadequately evaluated. IMPRESSION: Enlargement of cardiac silhouette. Asymmetric pulmonary infiltrates RIGHT greater than LEFT new since previous exam question asymmetric edema versus pneumonia. Electronically Signed   By: Ulyses Southward M.D.   On: 10/04/2015 17:34   Dg Chest Port 1 View  10/14/2015  CLINICAL DATA:  Acute respiratory failure, trach, low-grade fever EXAM: PORTABLE CHEST 1 VIEW COMPARISON:  10/10/2015 FINDINGS: Tracheostomy in satisfactory position. Lungs are essentially clear. Prior right lower lobe opacity has resolved. No definite pleural effusions. No pneumothorax. Cardiomegaly. Enteric tube courses below the diaphragm. IMPRESSION: Tracheostomy in satisfactory position. Prior right lower lobe opacity has resolved. No evidence of acute cardiopulmonary disease. Electronically Signed   By:  Charline Bills M.D.   On: 10/14/2015 09:46   Dg Chest Port 1 View  10/10/2015  CLINICAL DATA:  Post tracheostomy placement. EXAM: PORTABLE CHEST 1 VIEW COMPARISON:  10/09/2015 FINDINGS: Tracheostomy tube is unchanged. Central venous catheter sheath from left internal jugular approach is unchanged. Feeding catheter is collimated off the image. Cardiomediastinal silhouette is enlarged. Mediastinal contours appear intact. There is persistent right more than left haziness of the lung parenchyma and peribronchovascular opacities suggestive of edema. There are bilateral probably moderate in size pleural effusions. There is probable left lower lobe atelectasis. No evidence of pneumothorax. Osseous structures are without  acute abnormality. Soft tissues are grossly normal. IMPRESSION: Persistently enlarged cardiac silhouette with pulmonary edema and bilateral pleural effusions. Probable left lower lobe atelectasis. Electronically Signed   By: Ted Mcalpineobrinka  Dimitrova M.D.   On: 10/10/2015 09:10   Dg Chest Port 1 View  10/09/2015  CLINICAL DATA:  Recent tracheostomy placement EXAM: PORTABLE CHEST - 1 VIEW COMPARISON:  10/09/2015 FINDINGS: Left jugular line is stable. The nasogastric catheter has been removed as has the endotracheal tube. A new tracheostomy is seen in satisfactory position. Diffuse vascular congestion is noted with mild CHF. Small bilateral pleural effusions arch again seen. IMPRESSION: Tracheostomy in satisfactory position. The remainder of the exam is stable. Electronically Signed   By: Alcide CleverMark  Lukens M.D.   On: 10/09/2015 12:25   Dg Chest Port 1 View  10/09/2015  CLINICAL DATA:  ET tube placement EXAM: PORTABLE CHEST 1 VIEW COMPARISON:  10/08/2015 FINDINGS: The ET tube tip is above the carina. There is a left IJ catheter with tip overlying the aortic arch and left brachiocephalic vein. The NG tube tip is in the stomach. Stable cardiac enlargement and bilateral pleural effusions, left greater than  right. Moderate interstitial edema is identified. IMPRESSION: 1. Cardiac enlargement and CHF. 2. Stable position of support apparatus. Electronically Signed   By: Signa Kellaylor  Stroud M.D.   On: 10/09/2015 08:11   Dg Chest Port 1 View  10/08/2015  CLINICAL DATA:  Endotracheal tube EXAM: PORTABLE CHEST 1 VIEW COMPARISON:  Chest x-ray from yesterday FINDINGS: Unremarkable endotracheal tube with tip at the clavicular heads. Orogastric tube is obscured inferiorly due to patient factors. Unchanged positioning of left neck catheter with tip overlapping the aortic arch and left brachiocephalic vein. Specific recommendations were provided 10/06/2015. Unchanged diffuse interstitial and airspace opacity. No air leak or definite/increasing pleural effusion. IMPRESSION: 1. Unchanged positioning of tubes and vascular catheter, as above. 2. Stable extensive lung opacity in this patient with ARDS history. Electronically Signed   By: Marnee SpringJonathon  Watts M.D.   On: 10/08/2015 06:09   Dg Chest Port 1 View  10/07/2015  CLINICAL DATA:  Acute respiratory failure, shortness of breath EXAM: PORTABLE CHEST 1 VIEW COMPARISON:  Portable chest x-ray of October 06, 2015 FINDINGS: The lungs remain hypoinflated. Confluent alveolar opacities and increased interstitial densities persist but are slightly less conspicuous today. The cardiac silhouette remains enlarged. The pulmonary vascularity remains engorged. The endotracheal tube tip lies 4.6 cm above the carina. The esophagogastric tube tip projects below the inferior margin of the image. The left internal jugular venous catheter tip projects at the junction of the left internal jugular vein with the left subclavian vein. This is stable. IMPRESSION: Slight interval improvement in the alveolar opacities since yesterday's study may reflect some improvement in pulmonary edema or bilateral pneumonia. The positioning of the left sided vascular catheter is stable and is presumably venous in location.  Please see discussion in the conclusion of the report of the chest x-ray of October 06, 2015. Electronically Signed   By: David  SwazilandJordan M.D.   On: 10/07/2015 07:16   Dg Chest Port 1 View  10/06/2015  CLINICAL DATA:  New left IJ central venous catheter. EXAM: PORTABLE CHEST 1 VIEW COMPARISON:  10/06/2015; 10/04/2015 FINDINGS: Grossly unchanged enlarged cardiac silhouette and mediastinal contours. Minimally improved aeration of the lungs with persistent perihilar predominant heterogeneous airspace opacities with relative areas of confluence within the medial aspect the right upper lung as well as the left retrocardiac lung. No new focal airspace opacities. Interval placement of  a left cervical approach catheter with tip overlying the aortic arch. No supine evidence of pneumothorax or pleural effusion. Otherwise, stable position remaining support apparatus. Unchanged bones. IMPRESSION: 1. Interval placement of a left cervical approach catheter whose tip overlies the aortic arch and thus is an indeterminate location. Correlation with blood gas and/or waveform is recommended to exclude the presence of an inadvertent arterial catheter 2. Otherwise, stable position of support apparatus. No supine evidence of pneumothorax. 3. Minimally improved aeration along suggests slightly improved atelectasis and/or edema. 4. Residual extensive perihilar opacities with broad differential considerations including alveolar pulmonary edema, multifocal infection and/or aspiration. These results will be called to the ordering clinician or representative by the Radiologist Assistant, and communication documented in the PACS or zVision Dashboard. Electronically Signed   By: Simonne Come M.D.   On: 10/06/2015 17:09   Dg Chest Port 1 View  10/06/2015  CLINICAL DATA:  ARDS. EXAM: PORTABLE CHEST 1 VIEW COMPARISON:  10/04/2015 FINDINGS: Portions of the right lateral chest not imaged. Endotracheal tube and NG tube in stable position.  Cardiomegaly. Bilateral pulmonary infiltrates, particularly severe in right lung are noted. No interim change No pneumothorax. No acute bony abnormality . IMPRESSION: 1. Lines and tubes in stable position. 2. Dense diffuse right lung infiltrate, unchanged. Mild left lung infiltrate unchanged. 3. Stable cardiomegaly. Electronically Signed   By: Maisie Fus  Register   On: 10/06/2015 07:45   Dg Chest Port 1 View  10/05/2015  CLINICAL DATA:  55 year old male with respiratory failure EXAM: PORTABLE CHEST 1 VIEW COMPARISON:  Earlier radiograph dated 10/04/2015 FINDINGS: There has been interval placement of an endotracheal tube with tip approximately 2 cm above the carina. Recommend retraction by approximately 4 cm for optimal positioning. An enteric tube is partially visualized with tip beyond the image cut off. Bilateral airspace opacities, right greater than left again noted. There has been slight interval improvement of the aeration of the right lower lung field. Stable cardiomegaly. The osseous structures are grossly unremarkable. IMPRESSION: Endotracheal tube above the carina. Electronically Signed   By: Elgie Collard M.D.   On: 10/05/2015 00:16   Dg Chest Port 1 View  10/02/2015  CLINICAL DATA:  Fever x 2 days, h/o htn, non smoker EXAM: PORTABLE CHEST 1 VIEW COMPARISON:  01/15/2014 FINDINGS: Mildly degraded exam due to AP portable technique and patient body habitus. Both views are also minimally motion degraded. Midline trachea. Moderate cardiomegaly. No right-sided and no definite left pleural effusion. No pneumothorax. Pulmonary interstitial prominence is favored to be technique related; due to low lung volumes. Cannot exclude bibasilar airspace disease. IMPRESSION: Decreased sensitivity and specificity exam due to technique related factors, as described above. Cardiomegaly, without overt congestive failure. Possible bibasilar airspace disease. Electronically Signed   By: Jeronimo Greaves M.D.   On: 10/02/2015  12:30   Dg Abd 2 Views  10/20/2015  CLINICAL DATA:  Abdominal distension. EXAM: ABDOMEN - 2 VIEW COMPARISON:  10/09/2015 FINDINGS: Examination is limited by patient body habitus. No gross intraperitoneal free air is identified, however evaluation is limited due to inability to obtain upright or lateral decubitus images. An enteric tube terminates in the left upper abdomen in the region of the proximal stomach, more proximal than on the prior study. There are multiple loops of gas-filled, moderately dilated small bowel loops in the left hemiabdomen with a paucity of colonic gas. No acute osseous abnormality is identified. IMPRESSION: Moderate dilatation of multiple small bowel loops in the left abdomen concerning for obstruction. Electronically Signed  By: Sebastian Ache M.D.   On: 10/20/2015 13:16   Dg Abd Portable 1v  10/09/2015  CLINICAL DATA:  Examination to confirm NG tube placement EXAM: PORTABLE ABDOMEN - 1 VIEW COMPARISON:  Chest x-ray 10/09/2015 FINDINGS: The study is limited by patient's large body habitus. There is NG tube with tip in upper pelvis probable within distal elongated stomach. Mild gaseous distended small bowel loop in left abdomen. IMPRESSION: NG tube tip within upper pelvis probable within elongated distal stomach. Mild gaseous distended small bowel loop in left abdomen. Electronically Signed   By: Natasha Mead M.D.   On: 10/09/2015 14:20    Microbiology: Recent Results (from the past 240 hour(s))  Culture, blood (routine x 2)     Status: None   Collection Time: 10/14/15  1:44 AM  Result Value Ref Range Status   Specimen Description BLOOD LEFT HAND  Final   Special Requests BOTTLES DRAWN AEROBIC AND ANAEROBIC  Final   Culture  Setup Time   Final    GRAM POSITIVE COCCI IN CLUSTERS IN BOTH AEROBIC AND ANAEROBIC BOTTLES CRITICAL RESULT CALLED TO, READ BACK BY AND VERIFIED WITH: B REAP RN [id number] 10/14/15 A BROWNING    Culture   Final    STAPHYLOCOCCUS SPECIES (COAGULASE  NEGATIVE) THE SIGNIFICANCE OF ISOLATING THIS ORGANISM FROM A SINGLE SET OF BLOOD CULTURES WHEN MULTIPLE SETS ARE DRAWN IS UNCERTAIN. PLEASE NOTIFY THE MICROBIOLOGY DEPARTMENT WITHIN ONE WEEK IF SPECIATION AND SENSITIVITIES ARE REQUIRED.    Report Status 10/17/2015 FINAL  Final  Culture, blood (routine x 2)     Status: None   Collection Time: 10/14/15  1:51 AM  Result Value Ref Range Status   Specimen Description BLOOD RIGHT HAND  Final   Special Requests BOTTLES DRAWN AEROBIC AND ANAEROBIC  Final   Culture NO GROWTH 5 DAYS  Final   Report Status 10/19/2015 FINAL  Final  Culture, Urine     Status: None   Collection Time: 10/14/15  1:32 PM  Result Value Ref Range Status   Specimen Description URINE, CATHETERIZED  Final   Special Requests NONE  Final   Culture NO GROWTH 1 DAY  Final   Report Status 10/15/2015 FINAL  Final  C difficile quick scan w PCR reflex     Status: None   Collection Time: 10/15/15  4:29 AM  Result Value Ref Range Status   C Diff antigen NEGATIVE NEGATIVE Final   C Diff toxin NEGATIVE NEGATIVE Final   C Diff interpretation Negative for toxigenic C. difficile  Final  Culture, respiratory (NON-Expectorated)     Status: None   Collection Time: 10/18/15  1:10 PM  Result Value Ref Range Status   Specimen Description TRACHEAL ASPIRATE  Final   Special Requests Normal  Final   Gram Stain   Final    ABUNDANT WBC PRESENT, PREDOMINANTLY PMN NO SQUAMOUS EPITHELIAL CELLS SEEN RARE GRAM POSITIVE COCCI IN PAIRS RARE GRAM POSITIVE RODS Performed at [facility]    Culture   Final    FEW YEAST CONSISTENT WITH CANDIDA SPECIES Performed at [facility]    Report Status 10/20/2015 FINAL  Final     Labs: Basic Metabolic Panel:  Recent Labs Lab 10/15/15 0302 10/16/15 1235 10/17/15 0407 10/17/15 0730 10/18/15 0633 10/18/15 1454 10/19/15 0445 10/20/15 0510  NA 163* 154* QUANTITY NOT SUFFICIENT, UNABLE TO PERFORM TEST 152* 152* 152* 148* 145  K  4.1 3.8 QUANTITY NOT SUFFICIENT, UNABLE TO PERFORM TEST 5.1 3.3*  3.2* 3.1* 3.0*  CL 117* 111 QUANTITY NOT SUFFICIENT, UNABLE TO PERFORM TEST 109 110 111 109 106  CO2 32 30 QUANTITY NOT SUFFICIENT, UNABLE TO PERFORM TEST 30 29 29 28 28   GLUCOSE 128* 131* 119* 135* 129* 134* 141* 136*  BUN 59* 51* QUANTITY NOT SUFFICIENT, UNABLE TO PERFORM TEST 48* 47* 44* 39* 34*  CREATININE 1.44* 1.64* 1.61* 1.71* 1.55* 1.74* 1.51* 1.32*  CALCIUM 9.3 8.5* QUANTITY NOT SUFFICIENT, UNABLE TO PERFORM TEST 8.9 8.4* 8.1* 8.0* 8.3*  MG 2.8* 2.3 QUANTITY NOT SUFFICIENT, UNABLE TO PERFORM TEST  --  2.2  --  2.1  --    Liver Function Tests:  Recent Labs Lab 10/17/15 0407 10/17/15 0730 10/18/15 0633 10/19/15 0445 10/20/15 0510  AST 80* 90* 46* 35 24  ALT 151* 151* 119* 93* 68*  ALKPHOS 74 73 67 60 64  BILITOT 0.6 1.8* 0.6 0.5 0.5  PROT 6.6 6.4* 5.9* 6.0* 6.3*  ALBUMIN 2.7* 2.6* 2.5* 2.3* 2.3*   No results for input(s): LIPASE, AMYLASE in the last 168 hours. No results for input(s): AMMONIA in the last 168 hours. CBC:  Recent Labs Lab 10/16/15 0820 10/17/15 0407 10/18/15 8119 10/19/15 0445 10/20/15 0510  WBC 9.9 11.2* 11.6* 11.4* 9.4  NEUTROABS 7.1  --  9.1* 9.0* 7.5  HGB 9.2* 9.9* 9.2* 8.9* 8.9*  HCT 31.3* 32.0* 31.1* 30.6* 29.2*  MCV 93.7 91.7 92.3 92.4 91.3  PLT 355 327 364 327 297   Cardiac Enzymes: No results for input(s): CKTOTAL, CKMB, CKMBINDEX, TROPONINI in the last 168 hours. BNP: BNP (last 3 results)  Recent Labs  10/04/15 2350  BNP 239.7*    ProBNP (last 3 results) No results for input(s): PROBNP in the last 8760 hours.  CBG:  Recent Labs Lab 10/19/15 1257 10/19/15 1727 10/19/15 2103 10/20/15 0810 10/20/15 1332  GLUCAP 105* 117* 116* 139* 140*       Signed:  Carolyne Littles, MD [facility] [phone] pager

## 2015-10-20 NOTE — Progress Notes (Signed)
PULMONARY / CRITICAL CARE MEDICINE   Name: Adrian DiegoCarlton B Petersen Jr. MRN: [id number] DOB: [birth year]    ADMISSION DATE:  10/04/2015  [facility]  [facility]  CHIEF COMPLAINT: SOB  HISTORY OF PRESENT ILLNESS:   55 y/[facility] @  527 lbs and has OSA, also has drainage from left leg and panus consistent with cellulitis.  SUBJECTIVE/overnight:   Coughed trach out this am, changed over bronch to XLT  VITAL SIGNS: BP 149/76 mmHg  Pulse 90  Temp(Src) 99.3 F (37.4 C) (Oral)  Resp 20  Ht 5\' 11"  (1.803 m)  Wt 412 lb 4.2 oz (187 kg)  BMI 57.52 kg/m2  SpO2 96%  HEMODYNAMICS:    VENTILATOR SETTINGS: Vent Mode:  [-]  FiO2 (%):  [40 %] 40 %  INTAKE / OUTPUT: I/O last 3 completed shifts: In: 4935 [P.O.:720; I.V.:1935; NG/GT:2280] Out: 4250 [Urine:4250]  PHYSICAL EXAMINATION:  Gen: Morbid obesity, no distress Lungs: Diminished but clear, no wheeze, crackles CV: Regular rate and rhythm, no MRG GI: Obese, soft, nontender, BS+.  Neuro: awake and alert, answering questions and following commands Skin: massive panus, chronic appearing wound well dressed in panus, no drainage, fungal appearing erythema under panus in fold   LABS:  BMET  Recent Labs Lab 10/18/15 1454 10/19/15 0445 10/20/15 0510  NA 152* 148* 145  K 3.2* 3.1* 3.0*  CL 111 109 106  CO2 29 28 28   BUN 44* 39* 34*  CREATININE 1.74* 1.51* 1.32*  GLUCOSE 134* 141* 136*   Electrolytes  Recent Labs Lab 10/17/15 0407  10/18/15 0633 10/18/15 1454 10/19/15 0445 10/20/15 0510  CALCIUM QUANTITY NOT SUFFICIENT, UNABLE TO PERFORM TEST  < > 8.4* 8.1* 8.0* 8.3*  MG QUANTITY NOT SUFFICIENT, UNABLE TO PERFORM TEST  --  2.2  --  2.1  --   < > = values in this interval not displayed.   CBC  Recent Labs Lab 10/18/15 0633 10/19/15 0445 10/20/15 0510  WBC 11.6* 11.4* 9.4  HGB 9.2* 8.9* 8.9*  HCT 31.1* 30.6* 29.2*  PLT 364 327 297   ABG No results for input(s): PHART, PCO2ART, PO2ART in  the last 168 hours.   Liver Enzymes  Recent Labs Lab 10/18/15 0633 10/19/15 0445 10/20/15 0510  AST 46* 35 24  ALT 119* 93* 68*  ALKPHOS 67 60 64  BILITOT 0.6 0.5 0.5  ALBUMIN 2.5* 2.3* 2.3*   Cardiac Enzymes No results for input(s): TROPONINI, PROBNP in the last 168 hours. Glucose  Recent Labs Lab 10/18/15 2130 10/19/15 0828 10/19/15 1257 10/19/15 1727 10/19/15 2103 10/20/15 0810  GLUCAP 140* 121* 105* 117* 116* 139*   Imaging No results found.   STUDIES:  12/23 TEE with no vegetations  CULTURES: 12/16  bc [facility] >> Strep group g 2 of 2 >> levaquin sensitive 12/16  [facility]) >> multiple species 12/18  urine >> negative 12/19  Resp >> moderate candida 12/18  Blood >> Coag negative staph  12/21  Blood >> neg 12/28  Blood >> gram + cocci in clusters > coag neg staph 12/28  Urine >> negative 12/28  C-diff >> negative 01/01  trach cultures >> few yeast consistent with candida   ANTIBIOTICS: Ceftriaxone 12/17 >> 12/19 Vanco 12/18 >>12/22 Clinda 12/19 >> 12/19 Zosyn 12/19 >>12/22 Levaquin 12/22 >> 12/31 Vancomycin 12/28 >>12/29  Diflucan 12/30 >> 1/2  SIGNIFICANT EVENTS: 12/16  [facility] 12/18  Transfer to [facility] due to no ICU  [facility]urs[facility] at [facility] 12/23  Tracheostomy 12/25  off vent during daytime 12/27  off vent>TC 12/28  Fever, + blood cultures 12/29  FEES >> mild oral phase dysphagia, mild pharyngeal phase dysphagia, moderate aspiration risk 01/03  Coughed trach out, upsized to #6 XLT  LINES/TUBES: ETT 12/18 >> 12/23 Tracheostomy Shiley 6 mm Cuffed (JY) 12/23 > 12/30 changed to #6 cuffless and replaced 1/1 (coughed out)  L IJ TLC 12/20 >> 12/26  DISCUSSION: Morbidly obese male with panus and leg infections, sepsis, and hypoxic respiratory failure due to ALI superimposed on OHS +/- sedating meds. Intubated 12/18; tracheostomy 12/23, now with improving oxygenation, remains off vent and tolerating TC.    ASSESSMENT /  PLAN:  PULMONARY A:  ARDS - resolving, progressed to ATC   ?HCAP - resolved Hx of asthma  Acute pulmonary edema with total body volume overload Obstructive sleep apnea - resolved with trach Obesity hypoventilation syndrome - currently doing well with out vent support P:   Continue PMV trials SLP following Aspiration precautions Monitor trach site for bleeding post trach change Assess CXR post trach change Trach to #6 cuffless, but would not de-cannulate unless he is mobile / able to provide self care Wean O2 for O2 saturation > 92% Pulmonary hygiene Mobilize, push PT  INFECTIOUS A:   Cellulitis of panus with chronic wound Group G strep bacteremia, TEE negative for vegetations HCAP / aspiration PNA; CXR 12/28 without  infiltrates Fever and leukocytosis; C-Diff negative; urine culture negative; blood cultures with gram positive cocci in clusters P:   Completed abx Appreciate WOC consult  Trend WBC / fever curve - recheck sputum 1/1  Rest of treatment plan per [facility] to follow for respiratory status/trach  FAMILY  - Updates: Patient updated at bedside.    Ok to transfer to [facility] from pulmonary standpoint.    Canary Brim, NP-C [facility] Pgr: [phone] or if no answer [phone] 10/20/2015, 11:08 AM

## 2015-10-20 NOTE — Progress Notes (Addendum)
RT called to patient beside. Tracheal catheter will not pass through the trach. Adrian [name] is dislodged out place. RT has attempted to push trach back in place. Adrian [name] has been push back into place but catheter still will not pass. MD is aware and MD is coming to look at trach. Patient is not in any distress at this time. RT will continue to monitor.

## 2015-10-20 NOTE — Procedures (Signed)
Trach change note  Called to assess pt i placed bronch through current trach, trach NOT in airway Removed old trach Did not want to do anything blind  Then    Bronchoscopy Procedure Note Lieutenant DiegoCarlton B Uhlig Jr. [id number] [birth year]  Procedure: Bronchoscopy Indications: Diagnostic evaluation of the airways  Procedure Details Consent: Risks of procedure as well as the alternatives and risks of each were explained to the (patient/caregiver).  Consent for procedure obtained. Time Out: Verified patient identification, verified procedure, site/side was marked, verified correct patient position, special equipment/implants available, medications/allergies/relevent history reviewed, required imaging and test results available.  Performed  In preparation for procedure, patient was given 100% FiO2 and bronchoscope lubricated. Sedation: none  Bronch placed , through track, can see small slit closed airway Placed finger slight and opened Airway entered and the following bronchi were examined: Bronchi.   enterred airway, then placed over bronch the new trach 6 cuffless Bronchoscope removed.  , Patient placed back on 100% FiO2 at conclusion of procedure.    Evaluation Hemodynamic Status: BP stable throughout; O2 sats: stable throughout Patient's Current Condition: stable Specimens:  None Complications: No apparent complications Patient did tolerate procedure well.   Nelda BucksFEINSTEIN,Abbi Mancini J. 10/20/2015

## 2016-07-05 DIAGNOSIS — R200 Anesthesia of skin: Secondary | ICD-10-CM | POA: Insufficient documentation

## 2016-07-05 DIAGNOSIS — R202 Paresthesia of skin: Secondary | ICD-10-CM

## 2016-07-05 DIAGNOSIS — R29898 Other symptoms and signs involving the musculoskeletal system: Secondary | ICD-10-CM | POA: Insufficient documentation

## 2016-07-07 ENCOUNTER — Other Ambulatory Visit: Payer: Self-pay | Admitting: Neurology

## 2016-07-07 DIAGNOSIS — R531 Weakness: Secondary | ICD-10-CM

## 2016-09-13 DIAGNOSIS — E11610 Type 2 diabetes mellitus with diabetic neuropathic arthropathy: Secondary | ICD-10-CM | POA: Insufficient documentation

## 2016-09-13 DIAGNOSIS — F3164 Bipolar disorder, current episode mixed, severe, with psychotic features: Secondary | ICD-10-CM | POA: Insufficient documentation

## 2016-09-13 DIAGNOSIS — J449 Chronic obstructive pulmonary disease, unspecified: Secondary | ICD-10-CM | POA: Insufficient documentation

## 2016-09-13 DIAGNOSIS — G4733 Obstructive sleep apnea (adult) (pediatric): Secondary | ICD-10-CM | POA: Insufficient documentation

## 2016-09-13 DIAGNOSIS — Z8631 Personal history of diabetic foot ulcer: Secondary | ICD-10-CM | POA: Insufficient documentation

## 2016-09-15 DIAGNOSIS — M6281 Muscle weakness (generalized): Secondary | ICD-10-CM | POA: Insufficient documentation

## 2016-09-15 DIAGNOSIS — I10 Essential (primary) hypertension: Secondary | ICD-10-CM | POA: Insufficient documentation

## 2016-11-07 ENCOUNTER — Ambulatory Visit: Payer: Self-pay | Admitting: Podiatry

## 2016-11-10 DIAGNOSIS — R269 Unspecified abnormalities of gait and mobility: Secondary | ICD-10-CM | POA: Insufficient documentation

## 2016-12-19 ENCOUNTER — Ambulatory Visit: Payer: Medicare Other | Admitting: Podiatry

## 2017-03-27 ENCOUNTER — Ambulatory Visit: Payer: Medicare Other | Admitting: Podiatry

## 2017-05-04 ENCOUNTER — Encounter: Payer: Self-pay | Admitting: Podiatry

## 2017-05-04 ENCOUNTER — Ambulatory Visit (INDEPENDENT_AMBULATORY_CARE_PROVIDER_SITE_OTHER): Payer: BLUE CROSS/BLUE SHIELD | Admitting: Podiatry

## 2017-05-04 VITALS — BP 125/66 | HR 60

## 2017-05-04 DIAGNOSIS — E1142 Type 2 diabetes mellitus with diabetic polyneuropathy: Secondary | ICD-10-CM

## 2017-05-04 DIAGNOSIS — M79676 Pain in unspecified toe(s): Secondary | ICD-10-CM

## 2017-05-04 DIAGNOSIS — B351 Tinea unguium: Secondary | ICD-10-CM

## 2017-05-04 NOTE — Progress Notes (Signed)
   Subjective:    Patient ID: Adrian Diegoarlton B Gotsch Jr., male    DOB: Dec 21, 1960, 56 y.o.   MRN: [id number]  HPI this patient presents the office with chief complaint of long thick painful nails.  Patient states the nails are painful walking and wearing his shoes.  He says he is unable to self treat.  This patient is diabetic taking insulin.  He also has a diagnosis of a diabetic neuropathy.  He presents the office today for an evaluation and treatment of his long thick painful nails.    Review of Systems  Constitutional: Positive for unexpected weight change.  HENT: Positive for sinus pain and sinus pressure.   Respiratory: Positive for shortness of breath.   Cardiovascular: Positive for leg swelling.  Gastrointestinal: Positive for constipation.  Endocrine:       Excessive thirst, increase urination  Genitourinary: Positive for difficulty urinating, frequency and urgency.  Musculoskeletal: Positive for gait problem.  Neurological: Positive for weakness.  Psychiatric/Behavioral: Positive for behavioral problems.       Objective:   Physical Exam GENERAL APPEARANCE: Alert, conversant. Appropriately groomed. No acute distress.  VASCULAR: Pedal pulses are  palpable at  [facility] and [facility] bilateral.  Capillary refill time is immediate to all digits,  Normal temperature gradient.  Digital hair growth is present bilateral  NEUROLOGIC: sensation is diminished  to 5.07 monofilament at 5/5 sites bilateral.  Light touch is diminished  bilateral, Muscle strength normal.  MUSCULOSKELETAL: acceptable muscle strength, tone and stability bilateral.  Intrinsic muscluature intact bilateral.  Rectus appearance of foot and digits noted bilateral. DJD 1st MPJ  B/L  DERMATOLOGIC: skin color, texture, and turgor are within normal limits.  No preulcerative lesions or ulcers  are seen, no interdigital maceration noted.  No open lesions present.  Digital nails are asymptomatic. No drainage noted.         Assessment &  Plan:  Onychomycosis  Diabetic neuropathy.  IE  Debride nails  RTC 3 months.   Helane GuntherGregory Virgilene Stryker DPM

## 2017-05-05 ENCOUNTER — Encounter: Payer: Self-pay | Admitting: Podiatry

## 2017-07-14 ENCOUNTER — Ambulatory Visit: Payer: Medicare Other | Attending: Family Medicine | Admitting: Occupational Therapy

## 2017-08-03 ENCOUNTER — Ambulatory Visit: Payer: BLUE CROSS/BLUE SHIELD | Admitting: Podiatry

## 2017-09-22 ENCOUNTER — Emergency Department: Payer: Medicare Other

## 2017-09-22 ENCOUNTER — Encounter: Payer: Self-pay | Admitting: Emergency Medicine

## 2017-09-22 ENCOUNTER — Ambulatory Visit (INDEPENDENT_AMBULATORY_CARE_PROVIDER_SITE_OTHER): Payer: BLUE CROSS/BLUE SHIELD | Admitting: Podiatry

## 2017-09-22 ENCOUNTER — Inpatient Hospital Stay
Admission: EM | Admit: 2017-09-22 | Discharge: 2017-09-25 | DRG: 603 | Disposition: A | Discharge status: Home Health | Payer: Medicare Other | Attending: Internal Medicine | Admitting: Internal Medicine

## 2017-09-22 ENCOUNTER — Ambulatory Visit: Payer: BLUE CROSS/BLUE SHIELD | Admitting: Podiatry

## 2017-09-22 ENCOUNTER — Encounter: Payer: Self-pay | Admitting: Podiatry

## 2017-09-22 DIAGNOSIS — M86172 Other acute osteomyelitis, left ankle and foot: Secondary | ICD-10-CM

## 2017-09-22 DIAGNOSIS — E1169 Type 2 diabetes mellitus with other specified complication: Secondary | ICD-10-CM | POA: Diagnosis present

## 2017-09-22 DIAGNOSIS — E11621 Type 2 diabetes mellitus with foot ulcer: Secondary | ICD-10-CM

## 2017-09-22 DIAGNOSIS — Z6837 Body mass index (BMI) 37.0-37.9, adult: Secondary | ICD-10-CM

## 2017-09-22 DIAGNOSIS — I10 Essential (primary) hypertension: Secondary | ICD-10-CM | POA: Diagnosis not present

## 2017-09-22 DIAGNOSIS — E039 Hypothyroidism, unspecified: Secondary | ICD-10-CM | POA: Diagnosis not present

## 2017-09-22 DIAGNOSIS — F319 Bipolar disorder, unspecified: Secondary | ICD-10-CM | POA: Diagnosis present

## 2017-09-22 DIAGNOSIS — G4733 Obstructive sleep apnea (adult) (pediatric): Secondary | ICD-10-CM | POA: Diagnosis present

## 2017-09-22 DIAGNOSIS — M869 Osteomyelitis, unspecified: Secondary | ICD-10-CM | POA: Diagnosis present

## 2017-09-22 DIAGNOSIS — F17200 Nicotine dependence, unspecified, uncomplicated: Secondary | ICD-10-CM | POA: Diagnosis not present

## 2017-09-22 DIAGNOSIS — Z794 Long term (current) use of insulin: Secondary | ICD-10-CM | POA: Diagnosis not present

## 2017-09-22 DIAGNOSIS — L03116 Cellulitis of left lower limb: Principal | ICD-10-CM

## 2017-09-22 DIAGNOSIS — L97529 Non-pressure chronic ulcer of other part of left foot with unspecified severity: Secondary | ICD-10-CM | POA: Diagnosis not present

## 2017-09-22 DIAGNOSIS — E1140 Type 2 diabetes mellitus with diabetic neuropathy, unspecified: Secondary | ICD-10-CM | POA: Diagnosis not present

## 2017-09-22 DIAGNOSIS — Z79890 Hormone replacement therapy (postmenopausal): Secondary | ICD-10-CM | POA: Diagnosis not present

## 2017-09-22 DIAGNOSIS — I70245 Atherosclerosis of native arteries of left leg with ulceration of other part of foot: Secondary | ICD-10-CM

## 2017-09-22 DIAGNOSIS — F329 Major depressive disorder, single episode, unspecified: Secondary | ICD-10-CM | POA: Diagnosis not present

## 2017-09-22 DIAGNOSIS — M79673 Pain in unspecified foot: Secondary | ICD-10-CM | POA: Diagnosis present

## 2017-09-22 DIAGNOSIS — Z7951 Long term (current) use of inhaled steroids: Secondary | ICD-10-CM

## 2017-09-22 DIAGNOSIS — L089 Local infection of the skin and subcutaneous tissue, unspecified: Secondary | ICD-10-CM | POA: Diagnosis present

## 2017-09-22 DIAGNOSIS — E11628 Type 2 diabetes mellitus with other skin complications: Secondary | ICD-10-CM | POA: Diagnosis present

## 2017-09-22 DIAGNOSIS — E785 Hyperlipidemia, unspecified: Secondary | ICD-10-CM | POA: Diagnosis not present

## 2017-09-22 DIAGNOSIS — Z79899 Other long term (current) drug therapy: Secondary | ICD-10-CM

## 2017-09-22 DIAGNOSIS — J45909 Unspecified asthma, uncomplicated: Secondary | ICD-10-CM | POA: Diagnosis not present

## 2017-09-22 DIAGNOSIS — E0843 Diabetes mellitus due to underlying condition with diabetic autonomic (poly)neuropathy: Secondary | ICD-10-CM

## 2017-09-22 DIAGNOSIS — R262 Difficulty in walking, not elsewhere classified: Secondary | ICD-10-CM | POA: Diagnosis present

## 2017-09-22 LAB — CBC WITH DIFFERENTIAL/PLATELET
BASOS ABS: 0.1 10*3/uL (ref 0–0.1)
BASOS PCT: 0 %
EOS ABS: 0.1 10*3/uL (ref 0–0.7)
EOS PCT: 1 %
HCT: 34.5 % — ABNORMAL LOW (ref 40.0–52.0)
Hemoglobin: 11.7 g/dL — ABNORMAL LOW (ref 13.0–18.0)
Lymphocytes Relative: 11 %
Lymphs Abs: 1.6 10*3/uL (ref 1.0–3.6)
MCH: 30.3 pg (ref 26.0–34.0)
MCHC: 34 g/dL (ref 32.0–36.0)
MCV: 89.1 fL (ref 80.0–100.0)
Monocytes Absolute: 0.6 10*3/uL (ref 0.2–1.0)
Monocytes Relative: 4 %
Neutro Abs: 12.3 10*3/uL — ABNORMAL HIGH (ref 1.4–6.5)
Neutrophils Relative %: 84 %
PLATELETS: 323 10*3/uL (ref 150–440)
RBC: 3.87 MIL/uL — AB (ref 4.40–5.90)
RDW: 13.9 % (ref 11.5–14.5)
WBC: 14.6 10*3/uL — AB (ref 3.8–10.6)

## 2017-09-22 LAB — COMPREHENSIVE METABOLIC PANEL
ALK PHOS: 126 U/L (ref 38–126)
ALT: 23 U/L (ref 17–63)
AST: 19 U/L (ref 15–41)
Albumin: 3.6 g/dL (ref 3.5–5.0)
Anion gap: 10 (ref 5–15)
BILIRUBIN TOTAL: 0.3 mg/dL (ref 0.3–1.2)
BUN: 15 mg/dL (ref 6–20)
CALCIUM: 8.8 mg/dL — AB (ref 8.9–10.3)
CO2: 22 mmol/L (ref 22–32)
CREATININE: 0.88 mg/dL (ref 0.61–1.24)
Chloride: 108 mmol/L (ref 101–111)
GFR calc Af Amer: >60 mL/min (ref >60)
Glucose, Bld: 104 mg/dL — ABNORMAL HIGH (ref 65–99)
POTASSIUM: 4.2 mmol/L (ref 3.5–5.1)
Sodium: 140 mmol/L (ref 135–145)
TOTAL PROTEIN: 7.3 g/dL (ref 6.5–8.1)

## 2017-09-22 LAB — SEDIMENTATION RATE: SED RATE: 67 mm/h — AB (ref 0–20)

## 2017-09-22 LAB — LACTIC ACID, PLASMA: Lactic Acid, Venous: 0.8 mmol/L (ref 0.5–1.9)

## 2017-09-22 MED ORDER — VANCOMYCIN HCL IN DEXTROSE 1-5 GM/200ML-% IV SOLN
1000.0000 mg | Freq: Once | INTRAVENOUS | Status: AC
  Administered 2017-09-22: 1000 mg via INTRAVENOUS
  Filled 2017-09-22: qty 200

## 2017-09-22 MED ORDER — PIPERACILLIN-TAZOBACTAM 3.375 G IVPB 30 MIN
3.3750 g | Freq: Once | INTRAVENOUS | Status: AC
  Administered 2017-09-22: 3.375 g via INTRAVENOUS
  Filled 2017-09-22: qty 50

## 2017-09-22 NOTE — ED Triage Notes (Signed)
Patient presents to the ED after going to a podiatrist today.  Patient has had pain in his left foot for the past few days with some drainage.  Patient states he had a foot operation 7 years ago and it had healed up until today.  Patient states drainage, "looks like old coffee or old tea."  [facility]  Podiatrist told patient he felt he needed antibiotics for his foot.

## 2017-09-22 NOTE — ED Triage Notes (Signed)
Patient assisted to the bathroom and given water at this time.

## 2017-09-22 NOTE — ED Notes (Addendum)
Patient states he is his own guardian but he does have a power of attorney Adrian [name]([name] Menz).  Group home staff agrees, patient does not have a legal guardian.  Patient's group home staff worker's number is:  [phone]

## 2017-09-22 NOTE — ED Provider Notes (Signed)
[facility] Emergency Department Provider Note  ____________________________________________   First MD Initiated Contact with Patient 09/22/17 2258     (approximate)  I have reviewed the triage vital signs and the nursing notes.   HISTORY  Chief Complaint Foot Pain    HPI Adrian [name]. is a 56 y.o. male with extensive medical and lives at a group home but according to the group home staff the patient does not have a legal guardian.  The patient presents for evaluation of pain and drainage in his left foot.  Reportedly he has had surgery on this foot in the past and has had problems with ulcers secondary to his diabetes.  He noticed over the last week he has gradually been having more pain and drainage that is dark brown in color and has a foul odor.  He always has some swelling of his feet and legs, but he states that the swelling has been much worse over the last week and that his left foot is bigger than usual and red and the swelling extends into the lower leg.  He was able to go to his podiatrist at [facility] in [place] today.  The podiatrist probed the wound and commented on how deep it was and told him to come to the emergency department for IV antibiotics.  He reports that he has been feeling ill in general recently but cannot give any specifics.  He denies fever/chills, nausea, vomiting, abdominal pain, difficulty breathing, and dysuria.  He is having difficulty walking on the foot due to pain.  He describes his symptoms as severe.  Nothing makes them better.  Past Medical History:  Diagnosis Date  . Asthma   . Bipolar disorder (HCC)   . Depression   . Diabetes mellitus without complication (HCC)   . Hyperlipemia   . Hypertension   . Hypothyroidism   . Sleep apnea, obstructive     Patient Active Problem List   Diagnosis Date Noted  . Hypernatremia   . Candidal pneumonia (HCC)   . Abdominal wall cellulitis   . Bacteremia due to  Streptococcus   . Dysphagia   . OSA (obstructive sleep apnea)   . Obesity hypoventilation syndrome (HCC)   . Bipolar I disorder (HCC)   . Other specified hypothyroidism   . Tracheostomy care (HCC)   . C. difficile colitis   . Acute respiratory failure with hypoxia (HCC)   . Encounter for imaging study to confirm nasogastric (NG) tube placement   . History of ETT   . Acute respiratory failure (HCC)   . ARDS (adult respiratory distress syndrome) (HCC)   . Severe sepsis (HCC)   . Acute renal failure (HCC)   . Respiratory failure (HCC) 10/04/2015  . Sepsis (HCC) 10/02/2015  . Allergic rhinitis, cause unspecified 02/24/2014  . Unspecified constipation 02/24/2014  . Essential hypertension, benign 02/24/2014  . UTI (lower urinary tract infection) 01/16/2014  . Altered mental status 01/16/2014  . Diabetic osteomyelitis (HCC) 01/03/2014  . Diabetic neuropathy (HCC) 01/03/2014  . Bipolar disorder, unspecified (HCC) 01/03/2014  . Unspecified hypothyroidism 01/03/2014  . Obstructive sleep apnea 01/03/2014  . Asthma, chronic 01/03/2014  . Seborrheic dermatitis of scalp 01/03/2014  . Morbid obesity (HCC) 01/03/2014    Past Surgical History:  Procedure Laterality Date  . bone removed     bone removed in foot due to infection   . TONSILLECTOMY      Prior to Admission medications   Medication Sig  Start Date End Date Taking? Authorizing Provider  acetaminophen (TYLENOL) 325 MG tablet Take 325 mg by mouth every 4 (four) hours as needed.   Yes [provider]  albuterol (PROVENTIL HFA;VENTOLIN HFA) 108 (90 Base) MCG/ACT inhaler Inhale 2 puffs into the lungs every 6 (six) hours as needed for wheezing or shortness of breath.   Yes [provider]  alum & mag hydroxide-simeth (GERI-LANTA) 200-200-20 MG/5ML suspension Take by mouth as needed for indigestion or heartburn.   Yes [provider]  amLODipine (NORVASC) 10 MG tablet Take 1 tablet (10 mg total) by mouth daily.  10/20/15  Yes Drema DallasWoods, Curtis J, MD  atorvastatin (LIPITOR) 40 MG tablet Take 40 mg by mouth daily.   Yes [provider]  azelastine (ASTELIN) 0.1 % nasal spray Place 1 spray into both nostrils 2 (two) times daily. Use in each nostril as directed   Yes [provider]  bismuth subsalicylate (PEPTO BISMOL) 262 MG/15ML suspension Take 30 mLs by mouth every 6 (six) hours as needed.   Yes [provider]  cholecalciferol (VITAMIN D) 1000 units tablet Take 1,000 Units by mouth daily.   Yes [provider]  collagenase (SANTYL) ointment Apply topically daily. Apply to affected area daily 10/20/15  Yes Drema DallasWoods, Curtis J, MD  famotidine (PEPCID) 20 MG tablet Take 20 mg by mouth once.   Yes [provider]  ferrous sulfate 325 (65 FE) MG tablet Take 325 mg by mouth 2 (two) times daily with a meal.   Yes [provider]  fluticasone furoate-vilanterol (BREO ELLIPTA) 100-25 MCG/INH AEPB Inhale 1 puff into the lungs daily.   Yes [provider]  Fluticasone-Salmeterol (ADVAIR) 250-50 MCG/DOSE AEPB Inhale 1 puff into the lungs 2 (two) times daily.   Yes [provider]  guaifenesin (ROBITUSSIN) 100 MG/5ML syrup Take 200 mg by mouth 3 (three) times daily as needed for cough.   Yes [provider]  insulin aspart (NOVOLOG) 100 UNIT/ML injection Inject 0-5 Units into the skin at bedtime. 10/20/15  Yes Drema DallasWoods, Curtis J, MD  insulin aspart (NOVOLOG) 100 UNIT/ML injection Inject 0-9 Units into the skin 3 (three) times daily with meals. 10/20/15  Yes Drema DallasWoods, Curtis J, MD  levothyroxine (SYNTHROID, LEVOTHROID) 100 MCG tablet Take 1 tablet (100 mcg total) by mouth daily before breakfast. 10/20/15  Yes Drema DallasWoods, Curtis J, MD  lisinopril (PRINIVIL,ZESTRIL) 2.5 MG tablet Take 2.5 mg by mouth daily.   Yes [provider]  loperamide (IMODIUM A-D) 2 MG tablet Take 2 mg by mouth as needed for diarrhea or loose stools.   Yes [provider]    magnesium hydroxide (MILK OF MAGNESIA) 400 MG/5ML suspension Take by mouth daily as needed for mild constipation.   Yes [provider]  metoprolol (LOPRESSOR) 100 MG tablet Take 1 tablet (100 mg total) by mouth 2 (two) times daily. 10/20/15  Yes Drema DallasWoods, Curtis J, MD  montelukast (SINGULAIR) 10 MG tablet Take 10 mg by mouth daily.   Yes [provider]  Nutritional Supplements (FEEDING SUPPLEMENT, VITAL AF 1.2 CAL,) LIQD Place 1,000 mLs into feeding tube daily. 10/20/15  Yes Drema DallasWoods, Curtis J, MD  nystatin (MYCOSTATIN/NYSTOP) 100000 UNIT/GM POWD Apply topically 3 (three) times daily.   Yes [provider]  oxcarbazepine (TRILEPTAL) 600 MG tablet Take 600 mg by mouth 2 (two) times daily.   Yes [provider]  pantoprazole (PROTONIX) 40 MG tablet Take 1 tablet (40 mg total) by mouth daily at 12 noon. 10/20/15  Yes Drema Dallas, MD  tiotropium (SPIRIVA) 18 MCG inhalation capsule Place 18 mcg into inhaler and inhale daily.   Yes [provider]  traZODone (DESYREL) 100 MG tablet Take 100 mg by mouth at bedtime.   Yes [provider]  Water For Irrigation, Sterile (FREE WATER) SOLN Place 500 mLs into feeding tube every 4 (four) hours. 10/20/15  Yes Drema Dallas, MD  ziprasidone (GEODON) 40 MG capsule Take 40 mg by mouth 2 (two) times daily with a meal.  09/07/15  Yes [provider]    Allergies Patient has no known allergies.  Family History  Problem Relation Age of Onset  . Heart failure Mother   . Stroke Father     Social History Social History   Tobacco Use  . Smoking status: Current Every Day Smoker  . Smokeless tobacco: Never Used  Substance Use Topics  . Alcohol use: No  . Drug use: No    Review of Systems Constitutional: No fever/chills.  General malaise over the last several days Cardiovascular: Denies chest pain. Respiratory: Denies shortness of breath. Gastrointestinal: No abdominal pain.  No nausea, no vomiting.   No diarrhea.  No constipation. Genitourinary: Negative for dysuria. Musculoskeletal: Pain and swelling in the left foot and lower leg gradually worsening over 1 week.  Negative for neck pain.  Negative for back pain. Integumentary: Draining wound in the bottom of left foot Neurological: Negative for headaches, focal weakness or numbness.   ____________________________________________   PHYSICAL EXAM:  VITAL SIGNS: ED Triage Vitals  Enc Vitals Group     BP 09/22/17 1731 131/77     Pulse Rate 09/22/17 1731 80     Resp 09/22/17 1731 18     Temp 09/22/17 1731 99.2 F (37.3 C)     Temp Source 09/22/17 1731 Oral     SpO2 09/22/17 1731 100 %     Weight 09/22/17 1827 109.3 kg (241 lb)     Height 09/22/17 1827 1.803 m (5\' 11" )     Head Circumference --      Peak Flow --      Pain Score 09/22/17 1730 10     Pain Loc --      Pain Edu? --      Excl. in GC? --     Constitutional: Alert and oriented.  Has the appearance of chronic illness but is in no acute distress Eyes: Conjunctivae are normal.  Head: Atraumatic. Neck: No stridor.  No meningeal signs.   Cardiovascular: Normal rate, regular rhythm. Good peripheral circulation. Grossly normal heart sounds. Respiratory: Normal respiratory effort.  No retractions. Lungs CTAB. Gastrointestinal: Morbid obesity . soft and nontender. No distention.  Musculoskeletal: Trace pitting edema in the right lower extremity.  More substantial pitting edema in the left lower extremity particularly the left foot with some redness that may represent cellulitis.  He has a 4 cm x 2 cm open wound draining serosanguineous fluid on the bottom of the left foot near the fifth metatarsal.  The foot is doubly to moderately tender to palpation.  There is a foul odor associated with the wound. Neurologic:  Normal speech and language. No gross focal neurologic deficits are appreciated.  Skin: See musculoskeletal exam for wound description.  Otherwise  unremarkable.  ____________________________________________   LABS (all labs ordered are listed, but only abnormal results are displayed)  Labs Reviewed  COMPREHENSIVE METABOLIC PANEL - Abnormal; Notable for the following components:      Result Value  Glucose, Bld 104 (*)    Calcium 8.8 (*)    All other components within normal limits  CBC WITH DIFFERENTIAL/PLATELET - Abnormal; Notable for the following components:   WBC 14.6 (*)    RBC 3.87 (*)    Hemoglobin 11.7 (*)    HCT 34.5 (*)    Neutro Abs 12.3 (*)    All other components within normal limits  SEDIMENTATION RATE - Abnormal; Notable for the following components:   Sed Rate 67 (*)    All other components within normal limits  CULTURE, BLOOD (ROUTINE X 2)  CULTURE, BLOOD (ROUTINE X 2)  AEROBIC/ANAEROBIC CULTURE (SURGICAL/DEEP WOUND)  LACTIC ACID, PLASMA   ____________________________________________  EKG  None - EKG not ordered by ED physician ____________________________________________  RADIOLOGY Marylou MccoyI, Sophya Vanblarcom, personally viewed and evaluated these images (plain radiographs) as part of my medical decision making, as well as reviewing the written report by the radiologist.  Dg Foot Complete Left  Result Date: 09/22/2017 CLINICAL DATA:  Ulcer RIGHT lateral aspect of LEFT foot for 1 week per patient, history of ulcer for 4 years off and on, recently began draining again, soreness, diabetes mellitus EXAM: LEFT FOOT - COMPLETE 3+ VIEW COMPARISON:  04/25/2014 FINDINGS: Diffuse osseous demineralization. Interval amputation of the fifth metatarsal. Calcific density residual at the base of the proximal phalanx of the LEFT fifth toe demonstrates questionable cortical destruction, cannot exclude osteomyelitis. Overlying dressing artifacts. Significant soft tissue swelling of the LEFT foot, LEFT ankle and lower leg. No acute fracture, dislocation or additional bone destruction. Scattered intertarsal degenerative changes.  Calcaneal spurring. IMPRESSION: Interval amputation of the fifth metatarsal with deformity at the base of the proximal phalanx fifth toe, questionably demonstrating cortical loss, cannot exclude osteomyelitis ; these changes would be better evaluated by MR. Extensive soft tissue swelling LEFT foot with osseous demineralization and scattered degenerative changes. Electronically Signed   By: Ulyses SouthwardMark  Boles M.D.   On: 09/22/2017 19:04    ____________________________________________   PROCEDURES  Critical Care performed: No   Procedure(s) performed:   Procedures   ____________________________________________   INITIAL IMPRESSION / ASSESSMENT AND PLAN / ED COURSE  As part of my medical decision making, I reviewed the following data within the electronic MEDICAL RECORD NUMBER Nursing notes reviewed and incorporated, Labs reviewed , Old chart reviewed, Radiograph reviewed , Discussed with admitting physician  and A consult was requested and obtained from this/these consultant(s) (Dr. Ether GriffinsFowler, podiatry)    Differential diagnosis includes, but is not limited to, acute diabetic foot ulcer with osteomyelitis, chronic wound, gangrene, etc.  Based on the patient's comorbidities and body habitus, lab results that demonstrated elevation of his sedimentation rate and a leukocytosis, and the chronicity of symptoms which indicates gradual worsening over the last week (rather than a wound that is been present for an extended period of time), I suspect you will need IV antibiotics and inpatient podiatry consult and possible surgery.  I will call and speak with podiatry to verify the plan before starting antibiotics.  I do not feel he would be a good candidate for outpatient treatment given the reasons listed above, in addition to the fact it appears he has some spreading cellulitis of his left foot as well.  Fortunately the patient is not septic.  Clinical Course as of Sep 22 2332  Fri Sep 22, 2017  2312 Spoke by  phone with Dr. Ether GriffinsFowler with the podiatry service.  Discussed case, he agreed with plan for admission, broad-spectrum IV antibiotics, and overnight  MRI (w/o contrast) of affected foot.  He will see the patient in the hospital tomorrow.  Placed consult order, antibiotic orders, and will discuss with hospitalist.  [CF]  2322 Discussed in person with Dr. Katheren Shams with the hospitalist service who will admit.  [CF]    Clinical Course User Index [CF] Loleta Rose, MD    ____________________________________________  FINAL CLINICAL IMPRESSION(S) / ED DIAGNOSES  Final diagnoses:  Other acute osteomyelitis of left foot (HCC)  Diabetic ulcer of toe of left foot associated with type 2 diabetes mellitus, unspecified ulcer stage (HCC)  Cellulitis of left foot     MEDICATIONS GIVEN DURING THIS VISIT:  Medications  piperacillin-tazobactam (ZOSYN) IVPB 3.375 g (not administered)  vancomycin (VANCOCIN) IVPB 1000 mg/200 mL premix (not administered)     ED Discharge Orders    None       Note:  This document was prepared using Dragon voice recognition software and may include unintentional dictation errors.    Loleta Rose, MD 09/22/17 [phone]

## 2017-09-22 NOTE — Progress Notes (Signed)
   Subjective:  Patient with a history of diabetes mellitus and history of diabetic osteomyelitis with diabetic neuropathy presents today for initial treatment and evaluation regarding an ulcer to the lateral aspect of the left foot.  Patient believes that the ulcer has been present for approximately 1 week however the history of the ulcer approximately goes back for 4 years.  Patient states that he had the ulcer off and on for the past 4 years.  Patient states that one week ago it started draining again and now the foot is very sore to touch.     Objective/Physical Exam General: The patient is alert and oriented x3 in no acute distress.  Dermatology:  Wound #1 noted to the lateral aspect of the left foot measuring approximately 3.0 x 1.0 cm (LxW).  Probing of the ulceration indicates deep tissue abscesses with penetration of the ulcer greater than 3 cm deep.  It appears as though the deep compartments of the foot are infected based on clinical exam of the ulceration.  To the noted ulceration(s), there is no eschar. There is a moderate amount of slough, fibrin, and necrotic tissue noted. Granulation tissue and wound base is red. There is a heavy amount of serosanguineous drainage noted.  There is exposed tendon muscle.  The ulceration may even probe to bone however it is not identified clinically. Periwound integrity is intact. Skin is warm, dry and supple bilateral lower extremities.  Vascular: Palpable pedal pulses bilaterally.  Heavy amount of edema with erythema noted to the foot which extends along the plantar arch and also to the dorsum of the foot. Capillary refill within normal limits.  Neurological: Epicritic and protective threshold absent bilaterally.   Musculoskeletal Exam: Range of motion within normal limits to all pedal and ankle joints bilateral. Muscle strength 5/5 in all groups bilateral.   Assessment: #1  Ulcer left lateral foot secondary to diabetes mellitus #2 diabetes  mellitus w/ peripheral neuropathy #3 left foot cellulitis #4 possible deep tissue abscess with deep tissue infection  Plan of Care:  #1 Patient was evaluated. #2 medically necessary excisional debridement including muscle and deep fascial tissue was performed using a tissue nipper and a chisel blade. Excisional debridement of all the necrotic nonviable tissue down to healthy bleeding viable tissue was performed with post-debridement measurements same as pre-. #3 the wound was cleansed and dry sterile dressing applied. #4  Due to the patient's history of osteomyelitis and uncontrolled diabetes mellitus, as well as clinical exam today, there is concern for acute cellulitis of the foot.  Recommended the patient present to the emergency department at [facility] for workup and evaluation.  Based on labs and imaging the patient may require surgical incision and drainage with hospital admission. #5 deep wound cultures were taken today and sent to pathology for cultures and sensitivity #6 return to clinic upon discharge or follow-up with the on-call podiatrist   Felecia ShellingBrent M. Haroun Cotham, DPM [facility]  Dr. Felecia ShellingBrent M. Lindsie Simar, DPM    [address]                                        [place], [place] [id number]                Office [phone]  Fax [phone]

## 2017-09-23 ENCOUNTER — Inpatient Hospital Stay: Payer: Medicare Other

## 2017-09-23 ENCOUNTER — Other Ambulatory Visit: Payer: Self-pay

## 2017-09-23 DIAGNOSIS — M869 Osteomyelitis, unspecified: Secondary | ICD-10-CM | POA: Diagnosis present

## 2017-09-23 DIAGNOSIS — M86172 Other acute osteomyelitis, left ankle and foot: Secondary | ICD-10-CM | POA: Diagnosis present

## 2017-09-23 LAB — GLUCOSE, CAPILLARY
GLUCOSE-CAPILLARY: 107 mg/dL — AB (ref 65–99)
Glucose-Capillary: 102 mg/dL — ABNORMAL HIGH (ref 65–99)
Glucose-Capillary: 109 mg/dL — ABNORMAL HIGH (ref 65–99)
Glucose-Capillary: 116 mg/dL — ABNORMAL HIGH (ref 65–99)
Glucose-Capillary: 106 mg/dL — ABNORMAL HIGH (ref 65–99)

## 2017-09-23 LAB — CBC
HCT: 28.8 % — ABNORMAL LOW (ref 40.0–52.0)
Hemoglobin: 10.1 g/dL — ABNORMAL LOW (ref 13.0–18.0)
MCH: 30.9 pg (ref 26.0–34.0)
MCHC: 34.9 g/dL (ref 32.0–36.0)
MCV: 88.6 fL (ref 80.0–100.0)
PLATELETS: 245 10*3/uL (ref 150–440)
RBC: 3.26 MIL/uL — ABNORMAL LOW (ref 4.40–5.90)
RDW: 13.7 % (ref 11.5–14.5)
WBC: 10.2 10*3/uL (ref 3.8–10.6)

## 2017-09-23 LAB — MRSA PCR SCREENING: MRSA by PCR: POSITIVE — AB

## 2017-09-23 MED ORDER — ALUM & MAG HYDROXIDE-SIMETH 200-200-20 MG/5ML PO SUSP: 15.0000 mL | ORAL | Status: DC | PRN

## 2017-09-23 MED ORDER — AMLODIPINE BESYLATE 10 MG PO TABS
10.0000 mg | ORAL_TABLET | Freq: Every day | ORAL | Status: DC
  Administered 2017-09-23: 10 mg via ORAL
  Filled 2017-09-23: qty 1

## 2017-09-23 MED ORDER — LEVOTHYROXINE SODIUM 100 MCG PO TABS
100.0000 ug | ORAL_TABLET | Freq: Every day | ORAL | Status: DC
  Administered 2017-09-23 – 2017-09-25 (×3): 100 ug via ORAL
  Filled 2017-09-23 (×3): qty 1

## 2017-09-23 MED ORDER — GADOBENATE DIMEGLUMINE 529 MG/ML IV SOLN
20.0000 mL | Freq: Once | INTRAVENOUS | Status: AC | PRN
  Administered 2017-09-23: 20 mL via INTRAVENOUS

## 2017-09-23 MED ORDER — INSULIN ASPART 100 UNIT/ML ~~LOC~~ SOLN
5.0000 [IU] | Freq: Three times a day (TID) | SUBCUTANEOUS | Status: DC
  Administered 2017-09-24: 5 [IU] via SUBCUTANEOUS
  Filled 2017-09-23: qty 1

## 2017-09-23 MED ORDER — SODIUM CHLORIDE 0.9% FLUSH
3.0000 mL | Freq: Two times a day (BID) | INTRAVENOUS | Status: DC
  Administered 2017-09-23 – 2017-09-24 (×4): 3 mL via INTRAVENOUS

## 2017-09-23 MED ORDER — COLLAGENASE 250 UNIT/GM EX OINT
TOPICAL_OINTMENT | Freq: Every day | CUTANEOUS | Status: DC
  Administered 2017-09-24: 18:00:00 via TOPICAL
  Filled 2017-09-23 (×2): qty 30

## 2017-09-23 MED ORDER — HEPARIN SODIUM (PORCINE) 5000 UNIT/ML IJ SOLN
5000.0000 [IU] | Freq: Three times a day (TID) | INTRAMUSCULAR | Status: DC
  Administered 2017-09-23 – 2017-09-25 (×7): 5000 [IU] via SUBCUTANEOUS
  Filled 2017-09-23 (×6): qty 1

## 2017-09-23 MED ORDER — INSULIN ASPART 100 UNIT/ML ~~LOC~~ SOLN: 0.0000 [IU] | Freq: Three times a day (TID) | SUBCUTANEOUS | Status: DC

## 2017-09-23 MED ORDER — FLUTICASONE FUROATE-VILANTEROL 100-25 MCG/INH IN AEPB
1.0000 | INHALATION_SPRAY | Freq: Every day | RESPIRATORY_TRACT | Status: DC
  Administered 2017-09-23 – 2017-09-24 (×2): 1 via RESPIRATORY_TRACT
  Filled 2017-09-23 (×2): qty 28

## 2017-09-23 MED ORDER — ALBUTEROL SULFATE (2.5 MG/3ML) 0.083% IN NEBU: 2.5000 mg | INHALATION_SOLUTION | Freq: Four times a day (QID) | RESPIRATORY_TRACT | Status: DC | PRN

## 2017-09-23 MED ORDER — AZELASTINE HCL 0.1 % NA SOLN
1.0000 | Freq: Two times a day (BID) | NASAL | Status: DC
  Administered 2017-09-23 – 2017-09-24 (×4): 1 via NASAL
  Filled 2017-09-23 (×2): qty 30

## 2017-09-23 MED ORDER — HYDROCODONE-ACETAMINOPHEN 5-325 MG PO TABS
1.0000 | ORAL_TABLET | ORAL | Status: DC | PRN
  Administered 2017-09-24: 1 via ORAL
  Filled 2017-09-23: qty 1

## 2017-09-23 MED ORDER — NYSTATIN 100000 UNIT/GM EX POWD
Freq: Three times a day (TID) | CUTANEOUS | Status: DC
  Administered 2017-09-23 – 2017-09-24 (×4): via TOPICAL
  Filled 2017-09-23 (×2): qty 15

## 2017-09-23 MED ORDER — ONDANSETRON HCL 4 MG PO TABS: 4.0000 mg | ORAL_TABLET | Freq: Four times a day (QID) | ORAL | Status: DC | PRN

## 2017-09-23 MED ORDER — TIOTROPIUM BROMIDE MONOHYDRATE 18 MCG IN CAPS
18.0000 ug | ORAL_CAPSULE | Freq: Every day | RESPIRATORY_TRACT | Status: DC
  Administered 2017-09-23 – 2017-09-24 (×2): 18 ug via RESPIRATORY_TRACT
  Filled 2017-09-23 (×2): qty 5

## 2017-09-23 MED ORDER — TRAZODONE HCL 100 MG PO TABS
100.0000 mg | ORAL_TABLET | Freq: Every day | ORAL | Status: DC
  Administered 2017-09-23 – 2017-09-24 (×3): 100 mg via ORAL
  Filled 2017-09-23 (×3): qty 1

## 2017-09-23 MED ORDER — VITAL AF 1.2 CAL PO LIQD: 1000.0000 mL | ORAL | Status: DC

## 2017-09-23 MED ORDER — ACETAMINOPHEN 650 MG RE SUPP: 650.0000 mg | Freq: Four times a day (QID) | RECTAL | Status: DC | PRN

## 2017-09-23 MED ORDER — LOPERAMIDE HCL 2 MG PO CAPS: 2.0000 mg | ORAL_CAPSULE | ORAL | Status: DC | PRN

## 2017-09-23 MED ORDER — PIPERACILLIN-TAZOBACTAM 3.375 G IVPB
3.3750 g | Freq: Three times a day (TID) | INTRAVENOUS | Status: DC
  Administered 2017-09-23 – 2017-09-24 (×4): 3.375 g via INTRAVENOUS
  Filled 2017-09-23 (×2): qty 50

## 2017-09-23 MED ORDER — VANCOMYCIN HCL IN DEXTROSE 1-5 GM/200ML-% IV SOLN
1000.0000 mg | Freq: Three times a day (TID) | INTRAVENOUS | Status: DC
  Administered 2017-09-23 – 2017-09-24 (×4): 1000 mg via INTRAVENOUS
  Filled 2017-09-23 (×6): qty 200

## 2017-09-23 MED ORDER — VITAMIN D 1000 UNITS PO TABS
1000.0000 [IU] | ORAL_TABLET | Freq: Every day | ORAL | Status: DC
  Administered 2017-09-23 – 2017-09-25 (×3): 1000 [IU] via ORAL
  Filled 2017-09-23 (×3): qty 1

## 2017-09-23 MED ORDER — FREE WATER: 500.0000 mL | Status: DC

## 2017-09-23 MED ORDER — OXCARBAZEPINE 300 MG PO TABS
600.0000 mg | ORAL_TABLET | Freq: Two times a day (BID) | ORAL | Status: DC
  Administered 2017-09-23 – 2017-09-25 (×5): 600 mg via ORAL
  Filled 2017-09-23 (×6): qty 2

## 2017-09-23 MED ORDER — PANTOPRAZOLE SODIUM 40 MG PO TBEC
40.0000 mg | DELAYED_RELEASE_TABLET | Freq: Every day | ORAL | Status: DC
  Administered 2017-09-24: 40 mg via ORAL
  Filled 2017-09-23: qty 1

## 2017-09-23 MED ORDER — ACETAMINOPHEN 325 MG PO TABS
650.0000 mg | ORAL_TABLET | Freq: Four times a day (QID) | ORAL | Status: DC | PRN
Start: 2017-09-23 — End: 2017-09-25

## 2017-09-23 MED ORDER — FAMOTIDINE 20 MG PO TABS
20.0000 mg | ORAL_TABLET | Freq: Once | ORAL | Status: AC
  Administered 2017-09-23: 20 mg via ORAL
  Filled 2017-09-23: qty 1

## 2017-09-23 MED ORDER — FERROUS SULFATE 325 (65 FE) MG PO TABS
325.0000 mg | ORAL_TABLET | Freq: Two times a day (BID) | ORAL | Status: DC
  Administered 2017-09-23 – 2017-09-25 (×5): 325 mg via ORAL
  Filled 2017-09-23 (×5): qty 1

## 2017-09-23 MED ORDER — MONTELUKAST SODIUM 10 MG PO TABS
10.0000 mg | ORAL_TABLET | Freq: Every day | ORAL | Status: DC
  Administered 2017-09-23 – 2017-09-25 (×3): 10 mg via ORAL
  Filled 2017-09-23 (×3): qty 1

## 2017-09-23 MED ORDER — ONDANSETRON HCL 4 MG/2ML IJ SOLN: 4.0000 mg | Freq: Four times a day (QID) | INTRAMUSCULAR | Status: DC | PRN

## 2017-09-23 MED ORDER — VANCOMYCIN HCL IN DEXTROSE 1-5 GM/200ML-% IV SOLN: 1000.0000 mg | Freq: Two times a day (BID) | INTRAVENOUS | Status: DC

## 2017-09-23 MED ORDER — SODIUM CHLORIDE 0.9 % IV SOLN: 250.0000 mL | INTRAVENOUS | Status: DC | PRN

## 2017-09-23 MED ORDER — SODIUM CHLORIDE 0.9% FLUSH: 3.0000 mL | Freq: Two times a day (BID) | INTRAVENOUS | Status: DC

## 2017-09-23 MED ORDER — METOPROLOL TARTRATE 50 MG PO TABS
100.0000 mg | ORAL_TABLET | Freq: Two times a day (BID) | ORAL | Status: DC
  Administered 2017-09-23 – 2017-09-25 (×5): 100 mg via ORAL
  Filled 2017-09-23 (×5): qty 2

## 2017-09-23 MED ORDER — LISINOPRIL 5 MG PO TABS
2.5000 mg | ORAL_TABLET | Freq: Every day | ORAL | Status: DC
  Administered 2017-09-23 – 2017-09-25 (×2): 2.5 mg via ORAL
  Filled 2017-09-23 (×2): qty 1

## 2017-09-23 MED ORDER — SODIUM CHLORIDE 0.9% FLUSH: 3.0000 mL | INTRAVENOUS | Status: DC | PRN

## 2017-09-23 MED ORDER — PIPERACILLIN-TAZOBACTAM 3.375 G IVPB 30 MIN: 3.3750 g | Freq: Three times a day (TID) | INTRAVENOUS | Status: DC

## 2017-09-23 MED ORDER — INSULIN ASPART 100 UNIT/ML ~~LOC~~ SOLN: 0.0000 [IU] | Freq: Every day | SUBCUTANEOUS | Status: DC

## 2017-09-23 MED ORDER — ATORVASTATIN CALCIUM 20 MG PO TABS
40.0000 mg | ORAL_TABLET | Freq: Every day | ORAL | Status: DC
  Administered 2017-09-23 – 2017-09-24 (×2): 40 mg via ORAL
  Filled 2017-09-23 (×2): qty 2

## 2017-09-23 MED ORDER — FLUTICASONE FUROATE-VILANTEROL 200-25 MCG/INH IN AEPB: 1.0000 | INHALATION_SPRAY | Freq: Every day | RESPIRATORY_TRACT | Status: DC

## 2017-09-23 NOTE — Consult Note (Signed)
ORTHOPAEDIC CONSULTATION  REQUESTING PHYSICIAN: Alford Highland, MD  Chief Complaint: Left foot infection.  Concern for osteomyelitis.  HPI: Adrian [name]. is a 56 y.o. male who complains of left foot infection.  Seen at outside podiatrist office yesterday and had a wound that probed deep.  They sent him to the hospital for antibiotics and further workup with possible debridement.  Patient has a history of ulceration to this left foot for some time now.  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  He states he has some pain into his left foot.  He states the pain has been worse over the last week.  Past Medical History:  Diagnosis Date  . Asthma   . Bipolar disorder (HCC)   . Depression   . Diabetes mellitus without complication (HCC)   . Hyperlipemia   . Hypertension   . Hypothyroidism   . Sleep apnea, obstructive    Past Surgical History:  Procedure Laterality Date  . bone removed     bone removed in foot due to infection   . TONSILLECTOMY     Social History   Socioeconomic History  . Marital status: Single    Spouse name: None  . Number of children: None  . Years of education: None  . Highest education level: None  Social Needs  . Financial resource strain: None  . Food insecurity - worry: None  . Food insecurity - inability: None  . Transportation needs - medical: None  . Transportation needs - non-medical: None  Occupational History  . None  Tobacco Use  . Smoking status: Current Every Day Smoker  . Smokeless tobacco: Never Used  Substance and Sexual Activity  . Alcohol use: No  . Drug use: No  . Sexual activity: None  Other Topics Concern  . None  Social History Narrative  . None   Family History  Problem Relation Age of Onset  . Heart failure Mother   . Stroke Father    No Known Allergies Prior to Admission medications   Medication Sig Start Date End Date Taking? Authorizing Provider  acetaminophen (TYLENOL) 325 MG tablet Take 325 mg by  mouth every 4 (four) hours as needed.   Yes [provider]  albuterol (PROVENTIL HFA;VENTOLIN HFA) 108 (90 Base) MCG/ACT inhaler Inhale 2 puffs into the lungs every 6 (six) hours as needed for wheezing or shortness of breath.   Yes [provider]  alum & mag hydroxide-simeth (GERI-LANTA) 200-200-20 MG/5ML suspension Take by mouth as needed for indigestion or heartburn.   Yes [provider]  amLODipine (NORVASC) 10 MG tablet Take 1 tablet (10 mg total) by mouth daily. 10/20/15  Yes Drema Dallas, MD  atorvastatin (LIPITOR) 40 MG tablet Take 40 mg by mouth daily.   Yes [provider]  azelastine (ASTELIN) 0.1 % nasal spray Place 1 spray into both nostrils 2 (two) times daily. Use in each nostril as directed   Yes [provider]  bismuth subsalicylate (PEPTO BISMOL) 262 MG/15ML suspension Take 30 mLs by mouth every 6 (six) hours as needed.   Yes [provider]  cholecalciferol (VITAMIN D) 1000 units tablet Take 1,000 Units by mouth daily.   Yes [provider]  collagenase (SANTYL) ointment Apply topically daily. Apply to affected area daily 10/20/15  Yes Drema Dallas, MD  famotidine (PEPCID) 20 MG tablet Take 20 mg by mouth once.   Yes [provider]  ferrous sulfate 325 (65 FE) MG tablet Take  325 mg by mouth 2 (two) times daily with a meal.   Yes [provider]  fluticasone furoate-vilanterol (BREO ELLIPTA) 100-25 MCG/INH AEPB Inhale 1 puff into the lungs daily.   Yes [provider]  Fluticasone-Salmeterol (ADVAIR) 250-50 MCG/DOSE AEPB Inhale 1 puff into the lungs 2 (two) times daily.   Yes [provider]  guaifenesin (ROBITUSSIN) 100 MG/5ML syrup Take 200 mg by mouth 3 (three) times daily as needed for cough.   Yes [provider]  insulin aspart (NOVOLOG) 100 UNIT/ML injection Inject 0-5 Units into the skin at bedtime. 10/20/15  Yes Drema DallasWoods, Curtis J, MD  insulin aspart (NOVOLOG) 100  UNIT/ML injection Inject 0-9 Units into the skin 3 (three) times daily with meals. 10/20/15  Yes Drema DallasWoods, Curtis J, MD  levothyroxine (SYNTHROID, LEVOTHROID) 100 MCG tablet Take 1 tablet (100 mcg total) by mouth daily before breakfast. 10/20/15  Yes Drema DallasWoods, Curtis J, MD  lisinopril (PRINIVIL,ZESTRIL) 2.5 MG tablet Take 2.5 mg by mouth daily.   Yes [provider]  loperamide (IMODIUM A-D) 2 MG tablet Take 2 mg by mouth as needed for diarrhea or loose stools.   Yes [provider]  magnesium hydroxide (MILK OF MAGNESIA) 400 MG/5ML suspension Take by mouth daily as needed for mild constipation.   Yes [provider]  metoprolol (LOPRESSOR) 100 MG tablet Take 1 tablet (100 mg total) by mouth 2 (two) times daily. 10/20/15  Yes Drema DallasWoods, Curtis J, MD  montelukast (SINGULAIR) 10 MG tablet Take 10 mg by mouth daily.   Yes [provider]  Nutritional Supplements (FEEDING SUPPLEMENT, VITAL AF 1.2 CAL,) LIQD Place 1,000 mLs into feeding tube daily. 10/20/15  Yes Drema DallasWoods, Curtis J, MD  nystatin (MYCOSTATIN/NYSTOP) 100000 UNIT/GM POWD Apply topically 3 (three) times daily.   Yes [provider]  oxcarbazepine (TRILEPTAL) 600 MG tablet Take 600 mg by mouth 2 (two) times daily.   Yes [provider]  pantoprazole (PROTONIX) 40 MG tablet Take 1 tablet (40 mg total) by mouth daily at 12 noon. 10/20/15  Yes Drema DallasWoods, Curtis J, MD  tiotropium (SPIRIVA) 18 MCG inhalation capsule Place 18 mcg into inhaler and inhale daily.   Yes [provider]  traZODone (DESYREL) 100 MG tablet Take 100 mg by mouth at bedtime.   Yes [provider]  Water For Irrigation, Sterile (FREE WATER) SOLN Place 500 mLs into feeding tube every 4 (four) hours. 10/20/15  Yes Drema DallasWoods, Curtis J, MD  ziprasidone (GEODON) 40 MG capsule Take 40 mg by mouth 2 (two) times daily with a meal.  09/07/15  Yes [provider]   Mr Foot Left W Wo Contrast  Result Date: 09/23/2017 CLINICAL DATA:   Chronic left foot wound on and off for 7 years began to drain over the past week. Question osteomyelitis. EXAM: MRI OF THE LEFT FOREFOOT WITHOUT AND WITH CONTRAST TECHNIQUE: Multiplanar, multisequence MR imaging of the left forefoot was performed both before and after administration of intravenous contrast. CONTRAST:  20 cc MultiHance IV. COMPARISON:  Plain films left foot 09/22/2017. MRI left foot 08/11/2014. FINDINGS: Patient motion somewhat degrades the exam. Bones/Joint/Cartilage The patient is status post amputation of the fifth metatarsal. No bone marrow edema or enhancement to suggest osteomyelitis is identified. Ligaments Intact. Muscles and Tendons Intact. Severe fatty atrophy of intrinsic musculature the foot noted. Soft tissues A soft tissue wound is seen is seen on the plantar surface of the foot laterally. A small amount of fluid tracks into the wound patent  and measures approximately 2 cm craniocaudal by up to 0.6 cm AP by 0.8 cm transverse. Soft tissues surrounding the wound are edematous and enhancing. IMPRESSION: Soft tissue wound on the lateral side aspect of the foot with surrounding cellulitis. A small amount of fluid tracks into the wound. Negative for osteomyelitis or septic joint. Electronically Signed   By: Drusilla Kannerhomas  Dalessio M.D.   On: 09/23/2017 12:59   Dg Foot Complete Left  Result Date: 09/22/2017 CLINICAL DATA:  Ulcer RIGHT lateral aspect of LEFT foot for 1 week per patient, history of ulcer for 4 years off and on, recently began draining again, soreness, diabetes mellitus EXAM: LEFT FOOT - COMPLETE 3+ VIEW COMPARISON:  04/25/2014 FINDINGS: Diffuse osseous demineralization. Interval amputation of the fifth metatarsal. Calcific density residual at the base of the proximal phalanx of the LEFT fifth toe demonstrates questionable cortical destruction, cannot exclude osteomyelitis. Overlying dressing artifacts. Significant soft tissue swelling of the LEFT foot, LEFT ankle and lower leg. No  acute fracture, dislocation or additional bone destruction. Scattered intertarsal degenerative changes. Calcaneal spurring. IMPRESSION: Interval amputation of the fifth metatarsal with deformity at the base of the proximal phalanx fifth toe, questionably demonstrating cortical loss, cannot exclude osteomyelitis ; these changes would be better evaluated by MR. Extensive soft tissue swelling LEFT foot with osseous demineralization and scattered degenerative changes. Electronically Signed   By: Ulyses SouthwardMark  Boles M.D.   On: 09/22/2017 19:04    Positive ROS: All other systems have been reviewed and were otherwise negative with the exception of those mentioned in the HPI and as above.  12 point ROS was performed.  Physical Exam: General: Alert and oriented.  No apparent distress.  Vascular:  Left foot:Dorsalis Pedis:  present Posterior Tibial:  present  Right foot: Dorsalis Pedis:  present Posterior Tibial:  present  Neuro:absent protective sensation  Derm: Right foot without ulceration  Left foot plantar lateral ulceration along the area of the fifth metatarsal region.  This does probe deep underneath the foot.  No purulent drainage at this time.  Minimal erythema is noted.  Lymphangitic streaking is absent.  No foul odor.  Some fibrotic tissue noted from the ulceration at this time.  Ortho/MS: Good range of motion and muscle strength on the right lower extremity.  Diffuse edema to the left lower extremity.  Good range of motion noted with dorsi and plantar flexion.  Assessment: Diabetes with neuropathy. Diabetic foot ulceration Cellulitis  Plan: The MRI is negative for osteomyelitis or drainable abscess in the area.  He does have a wound that does probe deep into the plantar lateral and plantar aspect of the foot.  Deep wound culture was performed by myself as a wound culture has been performed in the outpatient clinic.  This can be followed outpatient as well.  At this point no need for surgical  debridement for now.  Certainly if this continues to progress outpatient surgical debridement can be performed.  I would recommend dressing changes with packing on a regular basis.  Follow-up in the outpatient clinic with his physicians.  Recommend heel touchdown weightbearing only on the left side for transfer.  Should ambulate minimally to the forefoot region to reduce pressure to this area.  Dressing changes: Flush wound with saline.  Packed with iodoform gauze packing and cover with bulky sterile dressing daily.    Irean HongFowler, Dara Beidleman A, DPM Cell [phone]   09/23/2017 3:16 PM

## 2017-09-23 NOTE — Progress Notes (Signed)
Patient ID: Adrian DiegoCarlton B Woodyard Jr., male   DOB: 1961/10/10, 56 y.o.   MRN: [id number]  [facility] PROGRESS NOTE  Adrian Diegoarlton B Routon Jr. XBM:[id number] DOB: 1961/10/10 DOA: 09/22/2017 [facility]  HPI/Subjective: Patient  has not been feeling well for a while.  Feels pretty weak.  Came to the ER with foot pain.  He states that a podiatrist as outpatient stuck a wooden spike into his foot.  Objective: Vitals:   09/23/17 0504 09/23/17 0830  BP: (!) 120/50 121/62  Pulse: 61 68  Resp: 18 16  Temp: 98.8 F (37.1 C) 98.5 F (36.9 C)  SpO2: 96% 98%    Filed Weights   09/22/17 1827  Weight: 109.3 kg (241 lb)    ROS: Review of Systems  Constitutional: Negative for chills and fever.  Eyes: Negative for blurred vision.  Respiratory: Negative for cough and shortness of breath.   Cardiovascular: Negative for chest pain.  Gastrointestinal: Negative for abdominal pain, constipation, diarrhea, nausea and vomiting.  Genitourinary: Negative for dysuria.  Musculoskeletal: Positive for joint pain.  Neurological: Negative for dizziness and headaches.   Exam: Physical Exam  Constitutional: He is oriented to person, place, and time.  HENT:  Nose: No mucosal edema.  Mouth/Throat: No oropharyngeal exudate or posterior oropharyngeal edema.  Eyes: Conjunctivae, EOM and lids are normal. Pupils are equal, round, and reactive to light.  Neck: No JVD present. Carotid bruit is not present. No edema present. No thyroid mass and no thyromegaly present.  Cardiovascular: S1 normal and S2 normal. Exam reveals no gallop.  No murmur heard. Pulses:      Dorsalis pedis pulses are 2+ on the right side, and 2+ on the left side.  Respiratory: No respiratory distress. He has decreased breath sounds in the right lower field and the left lower field. He has no wheezes. He has no rhonchi. He has no rales.  GI: Soft. Bowel sounds are normal. There is no tenderness.  Musculoskeletal:        Right ankle: He exhibits swelling.       Left ankle: He exhibits swelling.  Lymphadenopathy:    He has no cervical adenopathy.  Neurological: He is alert and oriented to person, place, and time. No cranial nerve deficit.  Skin: Skin is warm. Nails show no clubbing.  Bottom of left foot a little area where there is drainage.  Surrounding erythema.  Psychiatric: He has a normal mood and affect.      Data Reviewed: Basic Metabolic Panel: Recent Labs  Lab 09/22/17 1831  NA 140  K 4.2  CL 108  CO2 22  GLUCOSE 104*  BUN 15  CREATININE 0.88  CALCIUM 8.8*   Liver Function Tests: Recent Labs  Lab 09/22/17 1831  AST 19  ALT 23  ALKPHOS 126  BILITOT 0.3  PROT 7.3  ALBUMIN 3.6   CBC: Recent Labs  Lab 09/22/17 1831 09/23/17 0341  WBC 14.6* 10.2  NEUTROABS 12.3*  --   HGB 11.7* 10.1*  HCT 34.5* 28.8*  MCV 89.1 88.6  PLT 323 245    CBG: Recent Labs  Lab 09/23/17 0132 09/23/17 0752 09/23/17 1312  GLUCAP 107* 102* 109*    Recent Results (from the past 240 hour(s))  Blood culture (routine x 2)     Status: None (Preliminary result)   Collection Time: 09/22/17 11:06 PM  Result Value Ref Range Status   Specimen Description BLOOD RIGHT HAND  Final   Special Requests   Final  BOTTLES DRAWN AEROBIC AND ANAEROBIC Blood Culture results may not be optimal due to an excessive volume of blood received in culture bottles   Culture NO GROWTH < 12 HOURS  Final   Report Status PENDING  Incomplete  Aerobic/Anaerobic Culture (surgical/deep wound)     Status: None (Preliminary result)   Collection Time: 09/22/17 11:06 PM  Result Value Ref Range Status   Specimen Description WOUND left foot  Final   Special Requests Normal  Final   Gram Stain   Final    NO WBC SEEN ABUNDANT GRAM POSITIVE COCCI IN PAIRS FEW GRAM NEGATIVE RODS FEW GRAM POSITIVE RODS Performed at [facility], [address] [place], [place] [id number]    Culture PENDING  Incomplete   Report Status  PENDING  Incomplete  Blood culture (routine x 2)     Status: None (Preliminary result)   Collection Time: 09/22/17 11:07 PM  Result Value Ref Range Status   Specimen Description BLOOD LEFT HAND  Final   Special Requests   Final    BOTTLES DRAWN AEROBIC AND ANAEROBIC Blood Culture adequate volume   Culture NO GROWTH < 12 HOURS  Final   Report Status PENDING  Incomplete  MRSA PCR Screening     Status: Abnormal   Collection Time: 09/23/17  1:10 AM  Result Value Ref Range Status   MRSA by PCR POSITIVE (A) NEGATIVE Final    Comment:        The GeneXpert MRSA Assay (FDA approved for NASAL specimens only), is one component of a comprehensive MRSA colonization surveillance program. It is not intended to diagnose MRSA infection nor to guide or monitor treatment for MRSA infections. RESULT CALLED TO, READ BACK BY AND VERIFIED WITH: JENNIFER COBLE AT 0314 09/23/17.PMH      Studies: Mr Foot Left W Wo Contrast  Result Date: 09/23/2017 CLINICAL DATA:  Chronic left foot wound on and off for 7 years began to drain over the past week. Question osteomyelitis. EXAM: MRI OF THE LEFT FOREFOOT WITHOUT AND WITH CONTRAST TECHNIQUE: Multiplanar, multisequence MR imaging of the left forefoot was performed both before and after administration of intravenous contrast. CONTRAST:  20 cc MultiHance IV. COMPARISON:  Plain films left foot 09/22/2017. MRI left foot 08/11/2014. FINDINGS: Patient motion somewhat degrades the exam. Bones/Joint/Cartilage The patient is status post amputation of the fifth metatarsal. No bone marrow edema or enhancement to suggest osteomyelitis is identified. Ligaments Intact. Muscles and Tendons Intact. Severe fatty atrophy of intrinsic musculature the foot noted. Soft tissues A soft tissue wound is seen is seen on the plantar surface of the foot laterally. A small amount of fluid tracks into the wound patent and measures approximately 2 cm craniocaudal by up to 0.6 cm AP by 0.8 cm  transverse. Soft tissues surrounding the wound are edematous and enhancing. IMPRESSION: Soft tissue wound on the lateral side aspect of the foot with surrounding cellulitis. A small amount of fluid tracks into the wound. Negative for osteomyelitis or septic joint. Electronically Signed   By: Drusilla Kannerhomas  Dalessio M.D.   On: 09/23/2017 12:59   Dg Foot Complete Left  Result Date: 09/22/2017 CLINICAL DATA:  Ulcer RIGHT lateral aspect of LEFT foot for 1 week per patient, history of ulcer for 4 years off and on, recently began draining again, soreness, diabetes mellitus EXAM: LEFT FOOT - COMPLETE 3+ VIEW COMPARISON:  04/25/2014 FINDINGS: Diffuse osseous demineralization. Interval amputation of the fifth metatarsal. Calcific density residual at the base of the proximal  phalanx of the LEFT fifth toe demonstrates questionable cortical destruction, cannot exclude osteomyelitis. Overlying dressing artifacts. Significant soft tissue swelling of the LEFT foot, LEFT ankle and lower leg. No acute fracture, dislocation or additional bone destruction. Scattered intertarsal degenerative changes. Calcaneal spurring. IMPRESSION: Interval amputation of the fifth metatarsal with deformity at the base of the proximal phalanx fifth toe, questionably demonstrating cortical loss, cannot exclude osteomyelitis ; these changes would be better evaluated by MR. Extensive soft tissue swelling LEFT foot with osseous demineralization and scattered degenerative changes. Electronically Signed   By: Ulyses Southward M.D.   On: 09/22/2017 19:04    Scheduled Meds: . amLODipine  10 mg Oral Daily  . atorvastatin  40 mg Oral Daily  . azelastine  1 spray Each Nare BID  . cholecalciferol  1,000 Units Oral Daily  . collagenase   Topical Daily  . ferrous sulfate  325 mg Oral BID WC  . fluticasone furoate-vilanterol  1 puff Inhalation Daily  . heparin  5,000 Units Subcutaneous Q8H  . insulin aspart  0-20 Units Subcutaneous TID WC  . insulin aspart  0-5  Units Subcutaneous QHS  . insulin aspart  5 Units Subcutaneous TID WC  . levothyroxine  100 mcg Oral QAC breakfast  . lisinopril  2.5 mg Oral Daily  . metoprolol tartrate  100 mg Oral BID  . montelukast  10 mg Oral Daily  . nystatin   Topical TID  . oxcarbazepine  600 mg Oral BID  . pantoprazole  40 mg Oral Q1200  . sodium chloride flush  3 mL Intravenous Q12H  . tiotropium  18 mcg Inhalation Daily  . traZODone  100 mg Oral QHS   Continuous Infusions: . sodium chloride    . piperacillin-tazobactam (ZOSYN)  IV 3.375 g (09/23/17 0901)  . vancomycin Stopped (09/23/17 0715)    Assessment/Plan:  1. Diabetic foot ulcer with surrounding cellulitis.  MRI negative for osteomyelitis.  Patient empirically placed on vancomycin and Zosyn.  Podiatry consultation pending. 2. Type 2 diabetes mellitus.  Add on hemoglobin A1c.  Patient seems to be only on short acting insulin which is a strange regimen.  His sugars seem good at this point. 3. Morbid obesity.  Weight loss needed 4. Hypothyroidism unspecified on levothyroxine. 5. Essential hypertension on low-dose lisinopril and Norvasc. 6. Bipolar disorder on psychiatric medications  7. hyperlipidemia unspecified on atorvastatin 8. History of sleep apnea  Code Status:     Code Status Orders  (From admission, onward)        Start     Ordered   09/23/17 0113  Full code  Continuous     09/23/17 0112    Code Status History    Date Active Date Inactive Code Status Order ID Comments User Context   10/04/2015 22:26 10/20/2015 20:45 Full Code [id number]  Candie Chroman, NP Inpatient   10/04/2015 21:53 10/04/2015 22:26 Full Code [id number]  Candie Chroman, NP Inpatient   10/02/2015 14:32 10/04/2015 21:53 Full Code [id number]  Katharina Caper, MD Inpatient   01/16/2014 03:34 01/17/2014 18:06 Full Code [id number]  Hillary Bow, DO ED    Advance Directive Documentation     Most Recent Value  Type of Advance Directive  Healthcare Power of Attorney   Pre-existing out of facility DNR order (yellow form or pink MOST form)  No data  "MOST" Form in Place?  No data     Disposition Plan: To be determined  Consultants:  Podiatry  Antibiotics:  Vancomycin  Zosyn  Time spent: 28 minutes  [place]

## 2017-09-23 NOTE — Progress Notes (Signed)
Pharmacy Antibiotic Note  Adrian DiegoCarlton B Chopra Jr. is a 56 y.o. male admitted on 09/22/2017 with osteomyelitis.  Pharmacy has been consulted for vanc/zosyn dosing.  Plan: Patient received vanc 1g and zosyn 3.375g IV x 1 in ED  Will f/u w/ vanc 1g IV q8h w/ 6 hour stack dose. Will draw vanc trough 12/9 @ 0400 prior to 4th dose. Will continue zosyn 3.375g IV q8h   Ke 0.102 (overestimated due to body weight) T1/2 6 ~ 8 hrs Goal trough 15 - 20 mcg/mL  Height: 5\' 11"  (180.3 cm) Weight: 241 lb (109.3 kg) IBW/kg (Calculated) : 75.3  Temp (24hrs), Avg:99.2 F (37.3 C), Min:99.2 F (37.3 C), Max:99.2 F (37.3 C)  Recent Labs  Lab 09/22/17 1831  WBC 14.6*  CREATININE 0.88  LATICACIDVEN 0.8    Estimated Creatinine Clearance: 117.9 mL/min (by C-G formula based on SCr of 0.88 mg/dL).    No Known Allergies  Thank you for allowing pharmacy to be a part of this patient's care.  Thomasene Rippleavid Izella Ybanez, PharmD, BCPS Clinical Pharmacist 09/23/2017

## 2017-09-23 NOTE — Progress Notes (Signed)
Pt refused CPAP. Nurse notified. O2 Sat 98% on room air. Will continue to monitor pt.

## 2017-09-23 NOTE — H&P (Signed)
[facility] [place] NAME: Adrian [name]    MR#:  [id number]  DATE OF BIRTH:  Nov 25, 1960  DATE OF ADMISSION:  09/22/2017  PRIMARY CARE PHYSICIAN: [facility]   REQUESTING/REFERRING PHYSICIAN:   CHIEF COMPLAINT:   Chief Complaint  Patient presents with  . Foot Pain    HISTORY OF PRESENT ILLNESS: Adrian [name]  is a 56 y.o. male with a known history per below, sent over from his podiatrist clinic after left foot wound noted to be deep/concerning for abscess/probable osteomyelitis, patient lives in a group home for psychiatric illness, has had left foot wound off and on for approximately 7 years, wound has been draining material for the last 1 week, in the emergency room ESR 67, left foot x-ray noted for extensive edema/osteomyelitis could not be excluded, white count 14.6, case was discussed with podiatry-recommended IV antibiotics/MRI of the fluid for further evaluation as patient may require future operative intervention, patient evaluated emergency room, no apparent distress, resting comfortably in bed, nontoxic-appearing, patient now been admitted for acute on chronic left diabetic foot wound concerning for acute osteomyelitis.  PAST MEDICAL HISTORY:   Past Medical History:  Diagnosis Date  . Asthma   . Bipolar disorder ([place])   . Depression   . Diabetes mellitus without complication ([place])   . Hyperlipemia   . Hypertension   . Hypothyroidism   . Sleep apnea, obstructive     PAST SURGICAL HISTORY:  Past Surgical History:  Procedure Laterality Date  . bone removed     bone removed in foot due to infection   . TONSILLECTOMY      SOCIAL HISTORY:  Social History   Tobacco Use  . Smoking status: Current Every Day Smoker  . Smokeless tobacco: Never Used  Substance Use Topics  . Alcohol use: No    FAMILY HISTORY:  Family History  Problem Relation Age of Onset  . Heart failure Mother   . Stroke Father      DRUG ALLERGIES: No Known Allergies  REVIEW OF SYSTEMS:   CONSTITUTIONAL: No fever, fatigue or weakness.  EYES: No blurred or double vision.  EARS, NOSE, AND THROAT: No tinnitus or ear pain.  RESPIRATORY: No cough, shortness of breath, wheezing or hemoptysis.  CARDIOVASCULAR: No chest pain, orthopnea, edema.  GASTROINTESTINAL: No nausea, vomiting, diarrhea or abdominal pain.  GENITOURINARY: No dysuria, hematuria.  ENDOCRINE: No polyuria, nocturia,  HEMATOLOGY: No anemia, easy bruising or bleeding SKIN: No rash or lesion.  Chronic left foot wound with drainage MUSCULOSKELETAL: No joint pain or arthritis.   NEUROLOGIC: No tingling, numbness, weakness.  PSYCHIATRY: No anxiety or depression.   MEDICATIONS AT HOME:  Prior to Admission medications   Medication Sig Start Date End Date Taking? Authorizing Provider  acetaminophen (TYLENOL) 325 MG tablet Take 325 mg by mouth every 4 (four) hours as needed.   Yes [provider]  albuterol (PROVENTIL HFA;VENTOLIN HFA) 108 (90 Base) MCG/ACT inhaler Inhale 2 puffs into the lungs every 6 (six) hours as needed for wheezing or shortness of breath.   Yes [provider]  alum & mag hydroxide-simeth (GERI-LANTA) 200-200-20 MG/5ML suspension Take by mouth as needed for indigestion or heartburn.   Yes [provider]  amLODipine (NORVASC) 10 MG tablet Take 1 tablet (10 mg total) by mouth daily. 10/20/15  Yes Allie Bossier, MD  atorvastatin (LIPITOR) 40 MG tablet Take 40 mg by mouth daily.   Yes [provider]  azelastine (ASTELIN) 0.1 % nasal spray Place 1 spray into both nostrils 2 (two) times daily. Use in each nostril as directed   Yes [provider]  bismuth subsalicylate (PEPTO BISMOL) 262 MG/15ML suspension Take 30 mLs by mouth every 6 (six) hours as needed.   Yes [provider]  cholecalciferol (VITAMIN D) 1000 units tablet Take 1,000 Units by mouth daily.   Yes [provider]   collagenase (SANTYL) ointment Apply topically daily. Apply to affected area daily 10/20/15  Yes Allie Bossier, MD  famotidine (PEPCID) 20 MG tablet Take 20 mg by mouth once.   Yes [provider]  ferrous sulfate 325 (65 FE) MG tablet Take 325 mg by mouth 2 (two) times daily with a meal.   Yes [provider]  fluticasone furoate-vilanterol (BREO ELLIPTA) 100-25 MCG/INH AEPB Inhale 1 puff into the lungs daily.   Yes [provider]  Fluticasone-Salmeterol (ADVAIR) 250-50 MCG/DOSE AEPB Inhale 1 puff into the lungs 2 (two) times daily.   Yes [provider]  guaifenesin (ROBITUSSIN) 100 MG/5ML syrup Take 200 mg by mouth 3 (three) times daily as needed for cough.   Yes [provider]  insulin aspart (NOVOLOG) 100 UNIT/ML injection Inject 0-5 Units into the skin at bedtime. 10/20/15  Yes Allie Bossier, MD  insulin aspart (NOVOLOG) 100 UNIT/ML injection Inject 0-9 Units into the skin 3 (three) times daily with meals. 10/20/15  Yes Allie Bossier, MD  levothyroxine (SYNTHROID, LEVOTHROID) 100 MCG tablet Take 1 tablet (100 mcg total) by mouth daily before breakfast. 10/20/15  Yes Allie Bossier, MD  lisinopril (PRINIVIL,ZESTRIL) 2.5 MG tablet Take 2.5 mg by mouth daily.   Yes [provider]  loperamide (IMODIUM A-D) 2 MG tablet Take 2 mg by mouth as needed for diarrhea or loose stools.   Yes [provider]  magnesium hydroxide (MILK OF MAGNESIA) 400 MG/5ML suspension Take by mouth daily as needed for mild constipation.   Yes [provider]  metoprolol (LOPRESSOR) 100 MG tablet Take 1 tablet (100 mg total) by mouth 2 (two) times daily. 10/20/15  Yes Allie Bossier, MD  montelukast (SINGULAIR) 10 MG tablet Take 10 mg by mouth daily.   Yes [provider]  Nutritional Supplements (FEEDING SUPPLEMENT, VITAL AF 1.2 CAL,) LIQD Place 1,000 mLs into feeding tube daily. 10/20/15  Yes Allie Bossier, MD  nystatin (MYCOSTATIN/NYSTOP)  100000 UNIT/GM POWD Apply topically 3 (three) times daily.   Yes [provider]  oxcarbazepine (TRILEPTAL) 600 MG tablet Take 600 mg by mouth 2 (two) times daily.   Yes [provider]  pantoprazole (PROTONIX) 40 MG tablet Take 1 tablet (40 mg total) by mouth daily at 12 noon. 10/20/15  Yes Allie Bossier, MD  tiotropium (SPIRIVA) 18 MCG inhalation capsule Place 18 mcg into inhaler and inhale daily.   Yes [provider]  traZODone (DESYREL) 100 MG tablet Take 100 mg by mouth at bedtime.   Yes [provider]  Water For Irrigation, Sterile (FREE WATER) SOLN Place 500 mLs into feeding tube every 4 (four) hours. 10/20/15  Yes Allie Bossier, MD  ziprasidone (GEODON) 40 MG capsule Take 40 mg by mouth 2 (two) times daily with a meal.  09/07/15  Yes [provider]      PHYSICAL EXAMINATION:   VITAL SIGNS: Blood pressure (!) 127/52, pulse 71, temperature 99.2 F (37.3 C), temperature source Oral, resp. rate 17, height '5\' 11"'  (1.803 m), weight  109.3 kg (241 lb), SpO2 100 %.  GENERAL:  56 y.o.-year-old patient lying in the bed with no acute distress.  Morbidly obese, nontoxic-appearing EYES: Pupils equal, round, reactive to light and accommodation. No scleral icterus. Extraocular muscles intact.  HEENT: Head atraumatic, normocephalic. Oropharynx and nasopharynx clear.  NECK:  Supple, no jugular venous distention. No thyroid enlargement, no tenderness.  LUNGS: Normal breath sounds bilaterally, no wheezing, rales,rhonchi or crepitation. No use of accessory muscles of respiration.  CARDIOVASCULAR: S1, S2 normal. No murmurs, rubs, or gallops.  ABDOMEN: Soft, nontender, nondistended. Bowel sounds present. No organomegaly or mass.  EXTREMITIES: Bilateral lower extremity pitting edema-left greater than right, no cyanosis, or clubbing.  NEUROLOGIC: Cranial nerves II through XII are intact. MAES. Gait not checked.  PSYCHIATRIC: The patient is alert and oriented x  3.  SKIN: Lateral left foot wound, draining purulent material, left foot cellulitis noted   LABORATORY PANEL:   CBC Recent Labs  Lab 09/22/17 1831  WBC 14.6*  HGB 11.7*  HCT 34.5*  PLT 323  MCV 89.1  MCH 30.3  MCHC 34.0  RDW 13.9  LYMPHSABS 1.6  MONOABS 0.6  EOSABS 0.1  BASOSABS 0.1   ------------------------------------------------------------------------------------------------------------------  Chemistries  Recent Labs  Lab 09/22/17 1831  NA 140  K 4.2  CL 108  CO2 22  GLUCOSE 104*  BUN 15  CREATININE 0.88  CALCIUM 8.8*  AST 19  ALT 23  ALKPHOS 126  BILITOT 0.3   ------------------------------------------------------------------------------------------------------------------ estimated creatinine clearance is 117.9 mL/min (by C-G formula based on SCr of 0.88 mg/dL). ------------------------------------------------------------------------------------------------------------------ No results for input(s): TSH, T4TOTAL, T3FREE, THYROIDAB in the last 72 hours.  Invalid input(s): FREET3   Coagulation profile No results for input(s): INR, PROTIME in the last 168 hours. ------------------------------------------------------------------------------------------------------------------- No results for input(s): DDIMER in the last 72 hours. -------------------------------------------------------------------------------------------------------------------  Cardiac Enzymes No results for input(s): CKMB, TROPONINI, MYOGLOBIN in the last 168 hours.  Invalid input(s): CK ------------------------------------------------------------------------------------------------------------------ Invalid input(s): POCBNP  ---------------------------------------------------------------------------------------------------------------  Urinalysis    Component Value Date/Time   COLORURINE YELLOW (A) 10/02/2015 1136   APPEARANCEUR CLEAR (A) 10/02/2015 1136   APPEARANCEUR Clear  04/25/2014 1246   LABSPEC 1.006 10/02/2015 1136   LABSPEC 1.004 04/25/2014 1246   PHURINE 8.0 10/02/2015 1136   GLUCOSEU NEGATIVE 10/02/2015 1136   GLUCOSEU Negative 04/25/2014 1246   HGBUR NEGATIVE 10/02/2015 1136   BILIRUBINUR NEGATIVE 10/02/2015 1136   BILIRUBINUR Negative 04/25/2014 1246   KETONESUR NEGATIVE 10/02/2015 1136   PROTEINUR 100 (A) 10/02/2015 1136   UROBILINOGEN 0.2 01/16/2014 0207   NITRITE NEGATIVE 10/02/2015 1136   LEUKOCYTESUR NEGATIVE 10/02/2015 1136   LEUKOCYTESUR Trace 04/25/2014 1246     RADIOLOGY: Dg Foot Complete Left  Result Date: 09/22/2017 CLINICAL DATA:  Ulcer RIGHT lateral aspect of LEFT foot for 1 week per patient, history of ulcer for 4 years off and on, recently began draining again, soreness, diabetes mellitus EXAM: LEFT FOOT - COMPLETE 3+ VIEW COMPARISON:  04/25/2014 FINDINGS: Diffuse osseous demineralization. Interval amputation of the fifth metatarsal. Calcific density residual at the base of the proximal phalanx of the LEFT fifth toe demonstrates questionable cortical destruction, cannot exclude osteomyelitis. Overlying dressing artifacts. Significant soft tissue swelling of the LEFT foot, LEFT ankle and lower leg. No acute fracture, dislocation or additional bone destruction. Scattered intertarsal degenerative changes. Calcaneal spurring. IMPRESSION: Interval amputation of the fifth metatarsal with deformity at the base of the proximal phalanx fifth toe, questionably demonstrating cortical loss, cannot exclude osteomyelitis ; these changes would be better evaluated  by MR. Extensive soft tissue swelling LEFT foot with osseous demineralization and scattered degenerative changes. Electronically Signed   By: Lavonia Dana M.D.   On: 09/22/2017 19:04    EKG: Orders placed or performed during the hospital encounter of 10/04/15  . EKG 12-Lead  . EKG 12-Lead  . EKG 12-Lead  . EKG 12-Lead  . EKG 12-Lead  . EKG 12-Lead  . EKG 12-Lead  . EKG 12-Lead  . EKG  12-Lead  . EKG 12-Lead  . EKG 12-Lead  . 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 Management plans discussed with the patient, family and they are in agreement.  CODE STATUS: Code Status History    Date Active Date Inactive Code Status Order ID Comments User Context   10/04/2015 22:26 10/20/2015 20:45 Full Code [id number]  Rhodia Albright, NP Inpatient   10/04/2015 21:53 10/04/2015 22:26 Full Code [id number]  Rhodia Albright, NP Inpatient   10/02/2015 14:32 10/04/2015 21:53 Full Code [id number]  Theodoro Grist, MD Inpatient   01/16/2014 03:34 01/17/2014 18:06 Full Code [id number]  Etta Quill, DO ED       TOTAL TIME TAKING CARE OF THIS PATIENT: 40 minutes.    Avel Peace Salary M.D on 09/23/2017   Between 7am to 6pm - Pager - [phone]  After 6pm go to [email or web address] - password [email or web address] [facility]  Office  [phone]  CC: Primary care physician; [facility], [facility]   Note: This dictation was prepared with Dragon dictation along with smaller  phrase technology. Any transcriptional errors that result from this process are unintentional.

## 2017-09-24 DIAGNOSIS — M79673 Pain in unspecified foot: Secondary | ICD-10-CM | POA: Diagnosis present

## 2017-09-24 LAB — GLUCOSE, CAPILLARY
GLUCOSE-CAPILLARY: 97 mg/dL (ref 65–99)
GLUCOSE-CAPILLARY: 108 mg/dL — AB (ref 65–99)
Glucose-Capillary: 119 mg/dL — ABNORMAL HIGH (ref 65–99)
Glucose-Capillary: 93 mg/dL (ref 65–99)

## 2017-09-24 LAB — BASIC METABOLIC PANEL
ANION GAP: 10 (ref 5–15)
BUN: 13 mg/dL (ref 6–20)
CHLORIDE: 105 mmol/L (ref 101–111)
CO2: 24 mmol/L (ref 22–32)
Calcium: 8.6 mg/dL — ABNORMAL LOW (ref 8.9–10.3)
Creatinine, Ser: 0.93 mg/dL (ref 0.61–1.24)
GFR calc Af Amer: >60 mL/min (ref >60)
GLUCOSE: 103 mg/dL — AB (ref 65–99)
POTASSIUM: 4 mmol/L (ref 3.5–5.1)
Sodium: 139 mmol/L (ref 135–145)

## 2017-09-24 LAB — VANCOMYCIN, TROUGH: VANCOMYCIN TR: 19 ug/mL (ref 15–20)

## 2017-09-24 LAB — HEMOGLOBIN A1C
Hgb A1c MFr Bld: 5.1 % (ref 4.8–5.6)
Mean Plasma Glucose: 99.67 mg/dL

## 2017-09-24 MED ORDER — INSULIN ASPART 100 UNIT/ML ~~LOC~~ SOLN: 5.0000 [IU] | Freq: Three times a day (TID) | SUBCUTANEOUS | 0 refills | Status: DC

## 2017-09-24 MED ORDER — DOXYCYCLINE HYCLATE 100 MG PO TABS
100.0000 mg | ORAL_TABLET | Freq: Two times a day (BID) | ORAL | Status: DC
  Administered 2017-09-24: 100 mg via ORAL
  Filled 2017-09-24 (×2): qty 1

## 2017-09-24 MED ORDER — DOXYCYCLINE HYCLATE 100 MG PO TABS: 100.0000 mg | ORAL_TABLET | Freq: Two times a day (BID) | ORAL | 0 refills | Status: DC

## 2017-09-24 NOTE — Progress Notes (Signed)
CH rounding on unit. Patient was with medical staff. CH unable to consult at this time. CH available upon requests.

## 2017-09-24 NOTE — Discharge Instructions (Signed)

## 2017-09-24 NOTE — Progress Notes (Signed)
Physical Therapy Evaluation Patient Details Name: Adrian DiegoCarlton B Raatz Jr. MRN: [id number] DOB: 1961/05/31 Today's Date: 09/24/2017   History of Present Illness  Patient is a pleasant 56 y.o. male admitted on 07 DEC after being diagnosed with acute osteomyelitis of L foot. PMH includes DMII, benign essential HTN, and chronic bipolar.  Clinical Impression  Patient is a pleasant male admitted for above listed reasons. Patient previously living at group home, ambulating with RW. On evaluation, patient demonstrates need for verbal cues to maintain WB precautions for transfers. Because patient is not at baseline level of function, it is believed that he will benefit from f/u PT at home, as well as manual w/c due to significant standing/walking limitations at this time.    Follow Up Recommendations Home health PT    Equipment Recommendations  Wheelchair (measurements PT);Wheelchair cushion (measurements PT)    Recommendations for Other Services       Precautions / Restrictions Precautions Precautions: Fall Required Braces or Orthoses: Other Brace/Splint(Surgical shoe) Restrictions Weight Bearing Restrictions: Yes LLE Weight Bearing: Touchdown weight bearing      Mobility  Bed Mobility                  Transfers Overall transfer level: Needs assistance Equipment used: Rolling walker (2 wheeled) Transfers: Stand Pivot Transfers;Sit to/from Stand Sit to Stand: Min guard Stand pivot transfers: Min guard       General transfer comment: Patient moves from sit to stand and stand to sit with CGA. Verbal cues utilized during stand pivot transfer to maintain WB precautions.  Ambulation/Gait             General Gait Details: Not performed  Stairs            Wheelchair Mobility    Modified Rankin (Stroke Patients Only)       Balance Overall balance assessment: Needs assistance Sitting-balance support: Feet supported Sitting balance-Leahy Scale: Good      Standing balance support: Bilateral upper extremity supported Standing balance-Leahy Scale: Fair                               Pertinent Vitals/Pain Pain Assessment: Faces Faces Pain Scale: Hurts a little bit Pain Location: L foot Pain Descriptors / Indicators: Aching Pain Intervention(s): Limited activity within patient's tolerance;Monitored during session    Home Living Family/patient expects to be discharged to:: Group home                      Prior Function Level of Independence: Needs assistance   Gait / Transfers Assistance Needed: Performs household distances with RW  ADL's / Homemaking Assistance Needed: Performed by assistants at group home        Hand Dominance        Extremity/Trunk Assessment   Upper Extremity Assessment Upper Extremity Assessment: Overall WFL for tasks assessed    Lower Extremity Assessment Lower Extremity Assessment: Overall WFL for tasks assessed;LLE deficits/detail LLE Deficits / Details: Unable to assess due to dressing LLE: Unable to fully assess due to immobilization       Communication   Communication: No difficulties  Cognition Arousal/Alertness: Awake/alert Behavior During Therapy: WFL for tasks assessed/performed Overall Cognitive Status: Within Functional Limits for tasks assessed  General Comments      Exercises     Assessment/Plan    PT Assessment Patient needs continued PT services  PT Problem List Decreased activity tolerance;Decreased balance;Decreased mobility;Decreased safety awareness;Decreased knowledge of use of DME;Pain;Decreased knowledge of precautions       PT Treatment Interventions DME instruction;Gait training;Stair training;Functional mobility training;Therapeutic activities;Therapeutic exercise;Balance training;Patient/family education    PT Goals (Current goals can be found in the Care Plan section)  Acute Rehab PT  Goals Patient Stated Goal: To go home PT Goal Formulation: With patient Time For Goal Achievement: 10/08/17 Potential to Achieve Goals: Good    Frequency Min 2X/week   Barriers to discharge        Co-evaluation               AM-PAC PT "6 Clicks" Daily Activity  Outcome Measure Difficulty turning over in bed (including adjusting bedclothes, sheets and blankets)?: A Little Difficulty moving from lying on back to sitting on the side of the bed? : A Little Difficulty sitting down on and standing up from a chair with arms (e.g., wheelchair, bedside commode, etc,.)?: A Little Help needed moving to and from a bed to chair (including a wheelchair)?: A Little     6 Click Score: 12    End of Session Equipment Utilized During Treatment: Gait belt Activity Tolerance: Patient tolerated treatment well Patient left: in chair;with call bell/phone within reach;with chair alarm set Nurse Communication: Mobility status PT Visit Diagnosis: Muscle weakness (generalized) (M62.81);Difficulty in walking, not elsewhere classified (R26.2);Pain Pain - Right/Left: Left Pain - part of body: Ankle and joints of foot    Time: 1146-1209 PT Time Calculation (min) (ACUTE ONLY): 23 min   Charges:   PT Evaluation $PT Eval Low Complexity: 1 Low     PT G Codes:   PT G-Codes **NOT FOR INPATIENT CLASS** Functional Assessment Tool Used: AM-PAC 6 Clicks Basic Mobility;Clinical judgement Functional Limitation: Mobility: Walking and moving around Mobility: Walking and Moving Around Current Status ([id number]): At least 60 percent but less than 80 percent impaired, limited or restricted Mobility: Walking and Moving Around Goal Status [phone]): At least 40 percent but less than 60 percent impaired, limited or restricted      Neita CarpJulie Ann Aaliyan Brinkmeier, PT, DPT, COMT 09/24/2017, 12:17 PM

## 2017-09-24 NOTE — Progress Notes (Signed)
Patient ID: Adrian DiegoCarlton B Ellis Jr., male   DOB: September 16, 1961, 56 y.o.   MRN: [id number]  Patient ID: Adrian DiegoCarlton B Boehringer Jr., male   DOB: September 16, 1961, 56 y.o.   MRN: [id number]  [employer] PROGRESS NOTE  Adrian Diegoarlton B Pagel Jr. FAO:[id number] DOB: September 16, 1961 DOA: 09/22/2017 [facility]  HPI/Subjective: Patient feeling better, some soreness on foot.  I was just informed that EMS declined to take him back to his group home today.  Group home owner was unable to pick him up secondary to the weather.  Objective: Vitals:   09/23/17 2208 09/24/17 0330  BP: (!) 122/58 (!) 95/44  Pulse: 67 65  Resp:  18  Temp:  98.2 F (36.8 C)  SpO2:  98%    Filed Weights   09/22/17 1827  Weight: 109.3 kg (241 lb)    ROS: Review of Systems  Constitutional: Negative for chills and fever.  Eyes: Negative for blurred vision.  Respiratory: Negative for cough and shortness of breath.   Cardiovascular: Negative for chest pain.  Gastrointestinal: Negative for abdominal pain, constipation, diarrhea, nausea and vomiting.  Genitourinary: Negative for dysuria.  Musculoskeletal: Positive for joint pain.  Neurological: Negative for dizziness and headaches.   Exam: Physical Exam  Constitutional: He is oriented to person, place, and time.  HENT:  Nose: No mucosal edema.  Mouth/Throat: No oropharyngeal exudate or posterior oropharyngeal edema.  Eyes: Conjunctivae, EOM and lids are normal. Pupils are equal, round, and reactive to light.  Neck: No JVD present. Carotid bruit is not present. No edema present. No thyroid mass and no thyromegaly present.  Cardiovascular: S1 normal and S2 normal. Exam reveals no gallop.  No murmur heard. Pulses:      Dorsalis pedis pulses are 2+ on the right side, and 2+ on the left side.  Respiratory: No respiratory distress. He has decreased breath sounds in the right lower field and the left lower field. He has no wheezes. He has no rhonchi. He has no rales.   GI: Soft. Bowel sounds are normal. There is no tenderness.  Musculoskeletal:       Right ankle: He exhibits swelling.       Left ankle: He exhibits swelling.  Lymphadenopathy:    He has no cervical adenopathy.  Neurological: He is alert and oriented to person, place, and time. No cranial nerve deficit.  Skin: Skin is warm. Nails show no clubbing.  Bottom of left foot a little area where there is drainage.  Surrounding erythema.  Psychiatric: He has a normal mood and affect.      Data Reviewed: Basic Metabolic Panel: Recent Labs  Lab 09/22/17 1831 09/24/17 0410  NA 140 139  K 4.2 4.0  CL 108 105  CO2 22 24  GLUCOSE 104* 103*  BUN 15 13  CREATININE 0.88 0.93  CALCIUM 8.8* 8.6*   Liver Function Tests: Recent Labs  Lab 09/22/17 1831  AST 19  ALT 23  ALKPHOS 126  BILITOT 0.3  PROT 7.3  ALBUMIN 3.6   CBC: Recent Labs  Lab 09/22/17 1831 09/23/17 0341  WBC 14.6* 10.2  NEUTROABS 12.3*  --   HGB 11.7* 10.1*  HCT 34.5* 28.8*  MCV 89.1 88.6  PLT 323 245    CBG: Recent Labs  Lab 09/23/17 1312 09/23/17 1643 09/23/17 2122 09/24/17 0810 09/24/17 1229  GLUCAP 109* 116* 106* 119* 97    Recent Results (from the past 240 hour(s))  WOUND CULTURE     Status: Abnormal (Preliminary  result)   Collection Time: 09/22/17  4:15 PM  Result Value Ref Range Status   Gram Stain Result Final report  Final   Organism ID, Bacteria Comment  Final    Comment: No white blood cells seen.   Organism ID, Bacteria Comment  Final    Comment: Many gram negative diplococci.   Organism ID, Bacteria Comment  Final    Comment: Moderate number of gram positive cocci.   Aerobic Bacterial Culture Preliminary report (A)  Preliminary   Organism ID, Bacteria Comment  Preliminary    Comment: Microbiological testing to rule out the presence of possible pathogens is in progress.    Organism ID, Bacteria Comment (A)  Preliminary    Comment: Beta hemolytic Streptococcus, group C Heavy  growth Penicillin and ampicillin are drugs of choice for treatment of beta-hemolytic streptococcal infections. Susceptibility testing of penicillins and other beta-lactam agents approved by the FDA for treatment of beta-hemolytic streptococcal infections need not be performed routinely because nonsusceptible isolates are extremely rare in any beta-hemolytic streptococcus and have not been reported for Streptococcus pyogenes (group A). (CLSI)   Blood culture (routine x 2)     Status: None (Preliminary result)   Collection Time: 09/22/17 11:06 PM  Result Value Ref Range Status   Specimen Description BLOOD RIGHT HAND  Final   Special Requests   Final    BOTTLES DRAWN AEROBIC AND ANAEROBIC Blood Culture results may not be optimal due to an excessive volume of blood received in culture bottles   Culture NO GROWTH 2 DAYS  Final   Report Status PENDING  Incomplete  Aerobic/Anaerobic Culture (surgical/deep wound)     Status: None (Preliminary result)   Collection Time: 09/22/17 11:06 PM  Result Value Ref Range Status   Specimen Description WOUND left foot  Final   Special Requests Normal  Final   Gram Stain   Final    NO WBC SEEN ABUNDANT GRAM POSITIVE COCCI IN PAIRS FEW GRAM NEGATIVE RODS FEW GRAM POSITIVE RODS    Culture   Final    ABUNDANT UNIDENTIFIED ORGANISM Performed at [facility], [address] [place], [place] [address]    Report Status PENDING  Incomplete  Blood culture (routine x 2)     Status: None (Preliminary result)   Collection Time: 09/22/17 11:07 PM  Result Value Ref Range Status   Specimen Description BLOOD LEFT HAND  Final   Special Requests   Final    BOTTLES DRAWN AEROBIC AND ANAEROBIC Blood Culture adequate volume   Culture NO GROWTH 2 DAYS  Final   Report Status PENDING  Incomplete  MRSA PCR Screening     Status: Abnormal   Collection Time: 09/23/17  1:10 AM  Result Value Ref Range Status   MRSA by PCR POSITIVE (A) NEGATIVE Final    Comment:         The GeneXpert MRSA Assay (FDA approved for NASAL specimens only), is one component of a comprehensive MRSA colonization surveillance program. It is not intended to diagnose MRSA infection nor to guide or monitor treatment for MRSA infections. RESULT CALLED TO, READ BACK BY AND VERIFIED WITH: JENNIFER COBLE AT 0314 09/23/17.PMH   Aerobic/Anaerobic Culture (surgical/deep wound)     Status: None (Preliminary result)   Collection Time: 09/23/17  3:15 PM  Result Value Ref Range Status   Specimen Description ABSCESS  Final   Special Requests Immunocompromised  Final   Gram Stain   Final    RARE WBC PRESENT,  PREDOMINANTLY PMN MODERATE GRAM POSITIVE COCCI IN CHAINS    Culture   Final    ABUNDANT UNIDENTIFIED ORGANISM IDENTIFICATION TO FOLLOW Performed at [facility], [address], [place], [place] [address]    Report Status PENDING  Incomplete     Studies: Mr Foot Left W Wo Contrast  Result Date: 09/23/2017 CLINICAL DATA:  Chronic left foot wound on and off for 7 years began to drain over the past week. Question osteomyelitis. EXAM: MRI OF THE LEFT FOREFOOT WITHOUT AND WITH CONTRAST TECHNIQUE: Multiplanar, multisequence MR imaging of the left forefoot was performed both before and after administration of intravenous contrast. CONTRAST:  20 cc MultiHance IV. COMPARISON:  Plain films left foot 09/22/2017. MRI left foot 08/11/2014. FINDINGS: Patient motion somewhat degrades the exam. Bones/Joint/Cartilage The patient is status post amputation of the fifth metatarsal. No bone marrow edema or enhancement to suggest osteomyelitis is identified. Ligaments Intact. Muscles and Tendons Intact. Severe fatty atrophy of intrinsic musculature the foot noted. Soft tissues A soft tissue wound is seen is seen on the plantar surface of the foot laterally. A small amount of fluid tracks into the wound patent and measures approximately 2 cm craniocaudal by up to 0.6 cm AP by 0.8 cm transverse. Soft  tissues surrounding the wound are edematous and enhancing. IMPRESSION: Soft tissue wound on the lateral side aspect of the foot with surrounding cellulitis. A small amount of fluid tracks into the wound. Negative for osteomyelitis or septic joint. Electronically Signed   By: Drusilla Kanner M.D.   On: 09/23/2017 12:59   Dg Foot Complete Left  Result Date: 09/22/2017 CLINICAL DATA:  Ulcer RIGHT lateral aspect of LEFT foot for 1 week per patient, history of ulcer for 4 years off and on, recently began draining again, soreness, diabetes mellitus EXAM: LEFT FOOT - COMPLETE 3+ VIEW COMPARISON:  04/25/2014 FINDINGS: Diffuse osseous demineralization. Interval amputation of the fifth metatarsal. Calcific density residual at the base of the proximal phalanx of the LEFT fifth toe demonstrates questionable cortical destruction, cannot exclude osteomyelitis. Overlying dressing artifacts. Significant soft tissue swelling of the LEFT foot, LEFT ankle and lower leg. No acute fracture, dislocation or additional bone destruction. Scattered intertarsal degenerative changes. Calcaneal spurring. IMPRESSION: Interval amputation of the fifth metatarsal with deformity at the base of the proximal phalanx fifth toe, questionably demonstrating cortical loss, cannot exclude osteomyelitis ; these changes would be better evaluated by MR. Extensive soft tissue swelling LEFT foot with osseous demineralization and scattered degenerative changes. Electronically Signed   By: Ulyses Southward M.D.   On: 09/22/2017 19:04    Scheduled Meds: . atorvastatin  40 mg Oral Daily  . azelastine  1 spray Each Nare BID  . cholecalciferol  1,000 Units Oral Daily  . collagenase   Topical Daily  . doxycycline  100 mg Oral Q12H  . ferrous sulfate  325 mg Oral BID WC  . fluticasone furoate-vilanterol  1 puff Inhalation Daily  . heparin  5,000 Units Subcutaneous Q8H  . insulin aspart  0-20 Units Subcutaneous TID WC  . insulin aspart  0-5 Units  Subcutaneous QHS  . insulin aspart  5 Units Subcutaneous TID WC  . levothyroxine  100 mcg Oral QAC breakfast  . lisinopril  2.5 mg Oral Daily  . metoprolol tartrate  100 mg Oral BID  . montelukast  10 mg Oral Daily  . nystatin   Topical TID  . oxcarbazepine  600 mg Oral BID  . pantoprazole  40 mg  Oral Q1200  . sodium chloride flush  3 mL Intravenous Q12H  . tiotropium  18 mcg Inhalation Daily  . traZODone  100 mg Oral QHS   Continuous Infusions: . sodium chloride    . piperacillin-tazobactam (ZOSYN)  IV Stopped (09/24/17 1232)  . vancomycin Stopped (09/24/17 0743)    Assessment/Plan:  1. Diabetic foot ulcer with surrounding cellulitis.  MRI negative for osteomyelitis.  Patient empirically placed on vancomycin and Zosyn.    I switched him over to oral doxycycline.  Podiatry cleared to go home. 2. Type 2 diabetes mellitus.  pending hemoglobin A1c.  Patient on no medications at home. 3. Morbid obesity.  Weight loss needed 4. Hypothyroidism unspecified on levothyroxine. 5. Essential hypertension on low-dose lisinopril and stop the blood pressure on the lower side Norvasc. 6. Bipolar disorder on psychiatric medications  7. hyperlipidemia unspecified on atorvastatin 8. History of sleep apnea  Code Status:     Code Status Orders  (From admission, onward)        Start     Ordered   09/23/17 0113  Full code  Continuous     09/23/17 0112    Code Status History    Date Active Date Inactive Code Status Order ID Comments User Context   10/04/2015 22:26 10/20/2015 20:45 Full Code [id number]  Candie Chroman, NP Inpatient   10/04/2015 21:53 10/04/2015 22:26 Full Code [id number]  Candie Chroman, NP Inpatient   10/02/2015 14:32 10/04/2015 21:53 Full Code [id number]  Katharina Caper, MD Inpatient   01/16/2014 03:34 01/17/2014 18:06 Full Code [id number]  Hillary Bow, DO ED    Advance Directive Documentation     Most Recent Value  Type of Advance Directive  Healthcare Power of Attorney   Pre-existing out of facility DNR order (yellow form or pink MOST form)  No data  "MOST" Form in Place?  No data     Disposition Plan: To be determined  Consultants:  Podiatry  Antibiotics:  Doxycycline  Time spent: 45 minutes  Shalunda Lindh [employer]

## 2017-09-24 NOTE — Care Management Note (Signed)
Case Management Note  Patient Details  Name: Adrian DiegoCarlton B Savoca Jr. MRN: [id number] Date of Birth: [birth year]  Subjective/Objective:  Set up home health dressing daily dressing change to left foot and Physical Therapy for strengthening . Patient choose [facility].               Action/Plan:[facility] and discharge information faxed to [phone]. Start date 12.10.18   Expected Discharge Date:  09/24/17               Expected Discharge Plan:     In-House Referral:     Discharge planning Services   Wound care and Physical Therapy  Post Acute Care Choice:  Choice offered to:     DME Arranged:    DME Agency:     HH Arranged:   YES HH Agency:   [facility] spoke w Delman Cheadleisa [phone]  Status of Service:     If discussed at Long Length of Stay Meetings, dates discussed:    Additional Comments:  Caren MacadamMichelle Jahkeem Kurka, RN 09/24/2017, 12:42 PM

## 2017-09-24 NOTE — Progress Notes (Signed)
LCSW consulted  with Dr Hilton SinclairWeiting and floor nurse Ree KidaJack, he received medications from pharmacy and script and will place in patient envelope.  Consulted with Care manager and she arranged home health and PT for patient.  No further needs  Erline Siddoway LCSW

## 2017-09-24 NOTE — Discharge Summary (Addendum)
[employer] - [place] at [facility]   PATIENT NAME: Adrian ConesCarlton Cressman    MR#:  [id number]  DATE OF BIRTH:  [birth year]  DATE OF ADMISSION:  09/22/2017 ADMITTING PHYSICIAN: Evelena AsaMontell D Salary, MD  DATE OF DISCHARGE: 09/25/2017  PRIMARY CARE PHYSICIAN: [facility]    ADMISSION DIAGNOSIS:  Cellulitis of left foot [L03.116] Other acute osteomyelitis of left foot (HCC) [M86.172] Diabetic ulcer of toe of left foot associated with type 2 diabetes mellitus, unspecified ulcer stage (HCC) [X32.440[E11.621, L97.529]  DISCHARGE DIAGNOSIS:  Diabetic foot ulcer and cellulitis  SECONDARY DIAGNOSIS:   Past Medical History:  Diagnosis Date  . Asthma   . Bipolar disorder (HCC)   . Depression   . Diabetes mellitus without complication (HCC)   . Hyperlipemia   . Hypertension   . Hypothyroidism   . Sleep apnea, obstructive     HOSPITAL COURSE:   1.  Diabetic foot ulcer with surrounding cellulitis.  MRI of the foot negative for osteomyelitis.  Patient empirically placed on vancomycin and Zosyn here in the hospital.  Wound culture growing group c strep.  Change antibiotics to amoxil.  Podiatry consultation appreciated and no surgical procedure needed at this time.  Close clinical follow-up as outpatient with oral antibiotics.  Patient wants to follow with [facility]. 2.  Type 2 diabetes mellitus.  Hemoglobin A1c pending at this time.   Patient's sugars have all been good off medications. 3.  Morbid obesity.  Weight loss needed 4.  Hypothyroidism unspecified on levothyroxine 5.  Essential hypertension.  Blood pressure little bit on the lower side.  Can stop Norvasc.  Can continue low-dose lisinopril as outpatient. 6.  Bipolar disorder on psychiatric medications. 7.  Hyperlipidemia unspecified on atorvastatin 8.  History of sleep apnea.  Home health PT and RN set up for dressing changes and strengthening.  Changes in medication.  amoxil for 8 more days. Stop  Norvasc. The patient no longer takes Advair (Fluticason- Salmeterol) and I cant get this medication out of the medication reconciliation)  All of the medication should be the same as what he is taking at the group home.  DISCHARGE CONDITIONS:   Satisfactory  CONSULTS OBTAINED:  Treatment Team:  [name], Justin, DPM  DRUG ALLERGIES:  No Known Allergies  DISCHARGE MEDICATIONS:   Allergies as of 09/25/2017   No Known Allergies     Medication List    STOP taking these medications   amLODipine 10 MG tablet Commonly known as:  NORVASC   feeding supplement (VITAL AF 1.2 CAL) Liqd   free water Soln   insulin aspart 100 UNIT/ML injection Commonly known as:  novoLOG     TAKE these medications   acetaminophen 325 MG tablet Commonly known as:  TYLENOL Take 650 mg by mouth every 4 (four) hours as needed.   albuterol 108 (90 Base) MCG/ACT inhaler Commonly known as:  PROVENTIL HFA;VENTOLIN HFA Inhale 2 puffs into the lungs every 6 (six) hours as needed for wheezing or shortness of breath.   amoxicillin 500 MG capsule Commonly known as:  AMOXIL Take 1 capsule (500 mg total) by mouth every 8 (eight) hours.   atorvastatin 40 MG tablet Commonly known as:  LIPITOR Take 40 mg by mouth daily.   azelastine 0.1 % nasal spray Commonly known as:  ASTELIN Place 1 spray into both nostrils 2 (two) times daily. Use in each nostril as directed   bismuth subsalicylate 262 MG/15ML suspension Commonly known as:  PEPTO BISMOL Take 30  mLs by mouth every 6 (six) hours as needed.   BREO ELLIPTA 100-25 MCG/INH Aepb Generic drug:  fluticasone furoate-vilanterol Inhale 1 puff into the lungs daily.   cholecalciferol 1000 units tablet Commonly known as:  VITAMIN D Take 1,000 Units by mouth daily.   famotidine 20 MG tablet Commonly known as:  PEPCID Take 20 mg by mouth once.   ferrous sulfate 325 (65 FE) MG tablet Take 325 mg by mouth 2 (two) times daily with a meal.   GERI-LANTA  200-200-20 MG/5ML suspension Generic drug:  alum & mag hydroxide-simeth Take by mouth as needed for indigestion or heartburn.   guaifenesin 100 MG/5ML syrup Commonly known as:  ROBITUSSIN Take 200 mg by mouth 3 (three) times daily as needed for cough.   levothyroxine 100 MCG tablet Commonly known as:  SYNTHROID, LEVOTHROID Take 1 tablet (100 mcg total) by mouth daily before breakfast. What changed:  how much to take   lisinopril 2.5 MG tablet Commonly known as:  PRINIVIL,ZESTRIL Take 2.5 mg by mouth daily.   loperamide 2 MG tablet Commonly known as:  IMODIUM A-D Take 2 mg by mouth as needed for diarrhea or loose stools.   metoprolol tartrate 100 MG tablet Commonly known as:  LOPRESSOR Take 1 tablet (100 mg total) by mouth 2 (two) times daily.   MILK OF MAGNESIA 400 MG/5ML suspension Generic drug:  magnesium hydroxide Take by mouth daily as needed for mild constipation.   montelukast 10 MG tablet Commonly known as:  SINGULAIR Take 10 mg by mouth daily.   nystatin powder Generic drug:  nystatin Apply topically 4 (four) times daily as needed.   oxcarbazepine 600 MG tablet Commonly known as:  TRILEPTAL Take 600 mg by mouth 2 (two) times daily.   pantoprazole 40 MG tablet Commonly known as:  PROTONIX Take 1 tablet (40 mg total) by mouth daily at 12 noon.   senna 8.6 MG tablet Commonly known as:  SENOKOT Take 1 tablet by mouth at bedtime as needed for constipation.   SPIRIVA RESPIMAT 2.5 MCG/ACT Aers Generic drug:  Tiotropium Bromide Monohydrate Inhale 1 spray into the lungs daily.   traZODone 100 MG tablet Commonly known as:  DESYREL Take 100 mg by mouth at bedtime.   ziprasidone 40 MG capsule Commonly known as:  GEODON Take 40 mg by mouth 2 (two) times daily with a meal.        DISCHARGE INSTRUCTIONS:   Follow-up PMD 1 week Follow-up with Dr. Ether Griffins podiatry 1 week or his podiatrist  If you experience worsening of your admission symptoms, develop  shortness of breath, life threatening emergency, suicidal or homicidal thoughts you must seek medical attention immediately by calling 911 or calling your MD immediately  if symptoms less severe.  You Must read complete instructions/literature along with all the possible adverse reactions/side effects for all the Medicines you take and that have been prescribed to you. Take any new Medicines after you have completely understood and accept all the possible adverse reactions/side effects.   Please note  You were cared for by a hospitalist during your hospital stay. If you have any questions about your discharge medications or the care you received while you were in the hospital after you are discharged, you can call the unit and asked to speak with the hospitalist on call if the hospitalist that took care of you is not available. Once you are discharged, your primary care physician will handle any further medical issues. Please note that NO REFILLS  for any discharge medications will be authorized once you are discharged, as it is imperative that you return to your primary care physician (or establish a relationship with a primary care physician if you do not have one) for your aftercare needs so that they can reassess your need for medications and monitor your lab values.    Today   CHIEF COMPLAINT:   Chief Complaint  Patient presents with  . Foot Pain    HISTORY OF PRESENT ILLNESS:  Jaiveon Suppes  is a 56 y.o. male with a known history of foot ulcer presents with foot pain and redness.   VITAL SIGNS:  Blood pressure 114/61, pulse 65, temperature 98.6 F (37 C), temperature source Oral, resp. rate 18, height 5\' 11"  (1.803 m), weight 121 kg (266 lb 11.2 oz), SpO2 96 %.     PHYSICAL EXAMINATION:  GENERAL:  57 y.o.-year-old patient lying in the bed with no acute distress.  EYES: Pupils equal, round, reactive to light and accommodation. No scleral icterus. Extraocular muscles intact.   HEENT: Head atraumatic, normocephalic. Oropharynx and nasopharynx clear.  NECK:  Supple, no jugular venous distention. No thyroid enlargement, no tenderness.  LUNGS: Normal breath sounds bilaterally, no wheezing, rales,rhonchi or crepitation. No use of accessory muscles of respiration.  CARDIOVASCULAR: S1, S2 normal. No murmurs, rubs, or gallops.  ABDOMEN: Soft, non-tender, non-distended. Bowel sounds present. No organomegaly or mass.  EXTREMITIES: No pedal edema, cyanosis, or clubbing.  NEUROLOGIC: Cranial nerves II through XII are intact. Muscle strength 5/5 in all extremities. Sensation intact. Gait not checked.  PSYCHIATRIC: The patient is alert and oriented x 3.  SKIN: Left foot ulcer with packing in it. Erythema surrounding is less  DATA REVIEW:   CBC Recent Labs  Lab 09/23/17 0341  WBC 10.2  HGB 10.1*  HCT 28.8*  PLT 245    Chemistries  Recent Labs  Lab 09/22/17 1831 09/24/17 0410  NA 140 139  K 4.2 4.0  CL 108 105  CO2 22 24  GLUCOSE 104* 103*  BUN 15 13  CREATININE 0.88 0.93  CALCIUM 8.8* 8.6*  AST 19  --   ALT 23  --   ALKPHOS 126  --   BILITOT 0.3  --      Microbiology Results  Results for orders placed or performed during the hospital encounter of 09/22/17  Blood culture (routine x 2)     Status: None (Preliminary result)   Collection Time: 09/22/17 11:06 PM  Result Value Ref Range Status   Specimen Description BLOOD RIGHT HAND  Final   Special Requests   Final    BOTTLES DRAWN AEROBIC AND ANAEROBIC Blood Culture results may not be optimal due to an excessive volume of blood received in culture bottles   Culture NO GROWTH 3 DAYS  Final   Report Status PENDING  Incomplete  Aerobic/Anaerobic Culture (surgical/deep wound)     Status: None (Preliminary result)   Collection Time: 09/22/17 11:06 PM  Result Value Ref Range Status   Specimen Description WOUND left foot  Final   Special Requests Normal  Final   Gram Stain   Final    NO WBC SEEN ABUNDANT  GRAM POSITIVE COCCI IN PAIRS FEW GRAM NEGATIVE RODS FEW GRAM POSITIVE RODS    Culture   Final    ABUNDANT STREPTOCOCCUS GROUP C CULTURE REINCUBATED FOR BETTER GROWTH Performed at [facility], [address] [place], [place] [address]    Report Status PENDING  Incomplete  Blood  culture (routine x 2)     Status: None (Preliminary result)   Collection Time: 09/22/17 11:07 PM  Result Value Ref Range Status   Specimen Description BLOOD LEFT HAND  Final   Special Requests   Final    BOTTLES DRAWN AEROBIC AND ANAEROBIC Blood Culture adequate volume   Culture NO GROWTH 3 DAYS  Final   Report Status PENDING  Incomplete  MRSA PCR Screening     Status: Abnormal   Collection Time: 09/23/17  1:10 AM  Result Value Ref Range Status   MRSA by PCR POSITIVE (A) NEGATIVE Final    Comment:        The GeneXpert MRSA Assay (FDA approved for NASAL specimens only), is one component of a comprehensive MRSA colonization surveillance program. It is not intended to diagnose MRSA infection nor to guide or monitor treatment for MRSA infections. RESULT CALLED TO, READ BACK BY AND VERIFIED WITH: JENNIFER COBLE AT 0314 09/23/17.PMH   Aerobic/Anaerobic Culture (surgical/deep wound)     Status: None (Preliminary result)   Collection Time: 09/23/17  3:15 PM  Result Value Ref Range Status   Specimen Description ABSCESS  Final   Special Requests Immunocompromised  Final   Gram Stain   Final    RARE WBC PRESENT, PREDOMINANTLY PMN MODERATE GRAM POSITIVE COCCI IN CHAINS    Culture   Final    ABUNDANT STREPTOCOCCUS GROUP C CULTURE REINCUBATED FOR BETTER GROWTH Performed at [facility], [address] [place], [place] [address]    Report Status PENDING  Incomplete    RADIOLOGY:  Mr Foot Left W Wo Contrast  Result Date: 09/23/2017 CLINICAL DATA:  Chronic left foot wound on and off for 7 years began to drain over the past week. Question osteomyelitis. EXAM: MRI OF THE LEFT FOREFOOT WITHOUT  AND WITH CONTRAST TECHNIQUE: Multiplanar, multisequence MR imaging of the left forefoot was performed both before and after administration of intravenous contrast. CONTRAST:  20 cc MultiHance IV. COMPARISON:  Plain films left foot 09/22/2017. MRI left foot 08/11/2014. FINDINGS: Patient motion somewhat degrades the exam. Bones/Joint/Cartilage The patient is status post amputation of the fifth metatarsal. No bone marrow edema or enhancement to suggest osteomyelitis is identified. Ligaments Intact. Muscles and Tendons Intact. Severe fatty atrophy of intrinsic musculature the foot noted. Soft tissues A soft tissue wound is seen is seen on the plantar surface of the foot laterally. A small amount of fluid tracks into the wound patent and measures approximately 2 cm craniocaudal by up to 0.6 cm AP by 0.8 cm transverse. Soft tissues surrounding the wound are edematous and enhancing. IMPRESSION: Soft tissue wound on the lateral side aspect of the foot with surrounding cellulitis. A small amount of fluid tracks into the wound. Negative for osteomyelitis or septic joint. Electronically Signed   By: Drusilla Kannerhomas  Dalessio M.D.   On: 09/23/2017 12:59      Management plans discussed with the patient, group home owner Doristine MangoClement and they are in agreement.  CODE STATUS:     Code Status Orders  (From admission, onward)        Start     Ordered   09/23/17 0113  Full code  Continuous     09/23/17 0112    Code Status History    Date Active Date Inactive Code Status Order ID Comments User Context   10/04/2015 22:26 10/20/2015 20:45 Full Code [id number]  Candie ChromanMinor, William S, NP Inpatient   10/04/2015 21:53 10/04/2015 22:26 Full Code [id number]  Minor,  Vilinda Blanks, NP Inpatient   10/02/2015 14:32 10/04/2015 21:53 Full Code [id number]  Katharina Caper, MD Inpatient   01/16/2014 03:34 01/17/2014 18:06 Full Code [id number]  Hillary Bow, DO ED    Advance Directive Documentation     Most Recent Value  Type of Advance Directive   Healthcare Power of Attorney  Pre-existing out of facility DNR order (yellow form or pink MOST form)  No data  "MOST" Form in Place?  No data      TOTAL TIME TAKING CARE OF THIS PATIENT: .    Alford Highland M.D on 09/25/2017 at 9:15 AM  Between 7am to 6pm - Pager - [phone]  After 6pm go to [email or web address] - Social research officer, government  [employer] Office  [phone]  CC: Primary care physician; Center, [facility]
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
## 2017-09-24 NOTE — Progress Notes (Signed)
Pharmacy Antibiotic Note  Adrian DiegoCarlton B Swalley Jr. is a 56 y.o. male admitted on 09/22/2017 with osteomyelitis.  Pharmacy has been consulted for vanc/zosyn dosing.  Plan: Patient received vanc 1g and zosyn 3.375g IV x 1 in ED  Will f/u w/ vanc 1g IV q8h w/ 6 hour stack dose. Will draw vanc trough 12/9 @ 0400 prior to 4th dose. Will continue zosyn 3.375g IV q8h   Ke 0.102 (overestimated due to body weight) T1/2 6 ~ 8 hrs Goal trough 15 - 20 mcg/mL  12/9 @ 0416 VT 19 drawn 2 hours too early, extrapolated trough is 15.5 mcg/mL therapeutic. Will continue current regimen and recheck level 12/11 @ 0500. Will recheck BMP to assess renal function. UOP in past 24 hrs: 1.3L.  Height: 5\' 11"  (180.3 cm) Weight: 241 lb (109.3 kg) IBW/kg (Calculated) : 75.3  Temp (24hrs), Avg:99 F (37.2 C), Min:98.2 F (36.8 C), Max:99.7 F (37.6 C)  Recent Labs  Lab 09/22/17 1831 09/23/17 0341 09/24/17 0410  WBC 14.6* 10.2  --   CREATININE 0.88  --   --   LATICACIDVEN 0.8  --   --   VANCOTROUGH  --   --  19    Estimated Creatinine Clearance: 117.9 mL/min (by C-G formula based on SCr of 0.88 mg/dL).    No Known Allergies  Thank you for allowing pharmacy to be a part of this patient's care.  Thomasene Rippleavid Nicko Daher, PharmD, BCPS Clinical Pharmacist 09/24/2017

## 2017-09-25 DIAGNOSIS — Z79890 Hormone replacement therapy (postmenopausal): Secondary | ICD-10-CM | POA: Diagnosis not present

## 2017-09-25 DIAGNOSIS — L97529 Non-pressure chronic ulcer of other part of left foot with unspecified severity: Secondary | ICD-10-CM | POA: Diagnosis not present

## 2017-09-25 DIAGNOSIS — E1140 Type 2 diabetes mellitus with diabetic neuropathy, unspecified: Secondary | ICD-10-CM | POA: Diagnosis not present

## 2017-09-25 DIAGNOSIS — E11621 Type 2 diabetes mellitus with foot ulcer: Secondary | ICD-10-CM | POA: Diagnosis not present

## 2017-09-25 DIAGNOSIS — E1169 Type 2 diabetes mellitus with other specified complication: Secondary | ICD-10-CM | POA: Diagnosis not present

## 2017-09-25 DIAGNOSIS — Z794 Long term (current) use of insulin: Secondary | ICD-10-CM | POA: Diagnosis not present

## 2017-09-25 DIAGNOSIS — G4733 Obstructive sleep apnea (adult) (pediatric): Secondary | ICD-10-CM | POA: Diagnosis not present

## 2017-09-25 DIAGNOSIS — L089 Local infection of the skin and subcutaneous tissue, unspecified: Secondary | ICD-10-CM

## 2017-09-25 DIAGNOSIS — F17200 Nicotine dependence, unspecified, uncomplicated: Secondary | ICD-10-CM | POA: Diagnosis not present

## 2017-09-25 DIAGNOSIS — Z6837 Body mass index (BMI) 37.0-37.9, adult: Secondary | ICD-10-CM | POA: Diagnosis not present

## 2017-09-25 DIAGNOSIS — E039 Hypothyroidism, unspecified: Secondary | ICD-10-CM | POA: Diagnosis not present

## 2017-09-25 DIAGNOSIS — E11628 Type 2 diabetes mellitus with other skin complications: Secondary | ICD-10-CM | POA: Diagnosis present

## 2017-09-25 DIAGNOSIS — J45909 Unspecified asthma, uncomplicated: Secondary | ICD-10-CM | POA: Diagnosis not present

## 2017-09-25 DIAGNOSIS — Z79899 Other long term (current) drug therapy: Secondary | ICD-10-CM | POA: Diagnosis not present

## 2017-09-25 DIAGNOSIS — F329 Major depressive disorder, single episode, unspecified: Secondary | ICD-10-CM | POA: Diagnosis not present

## 2017-09-25 DIAGNOSIS — F319 Bipolar disorder, unspecified: Secondary | ICD-10-CM | POA: Diagnosis not present

## 2017-09-25 DIAGNOSIS — L03116 Cellulitis of left lower limb: Secondary | ICD-10-CM | POA: Diagnosis present

## 2017-09-25 DIAGNOSIS — E785 Hyperlipidemia, unspecified: Secondary | ICD-10-CM | POA: Diagnosis not present

## 2017-09-25 DIAGNOSIS — I10 Essential (primary) hypertension: Secondary | ICD-10-CM | POA: Diagnosis not present

## 2017-09-25 LAB — GLUCOSE, CAPILLARY
GLUCOSE-CAPILLARY: 105 mg/dL — AB (ref 65–99)
GLUCOSE-CAPILLARY: 118 mg/dL — AB (ref 65–99)

## 2017-09-25 MED ORDER — AMOXICILLIN 500 MG PO CAPS: 500.0000 mg | ORAL_CAPSULE | Freq: Three times a day (TID) | ORAL | 0 refills | Status: DC

## 2017-09-25 MED ORDER — AMOXICILLIN 500 MG PO CAPS
500.0000 mg | ORAL_CAPSULE | Freq: Three times a day (TID) | ORAL | Status: DC
  Filled 2017-09-25 (×2): qty 1

## 2017-09-25 NOTE — [place] FL2 (Signed)
[place] MEDICAID FL2 LEVEL OF CARE SCREENING TOOL     IDENTIFICATION  Patient Name: Adrian DiegoCarlton B Heid Jr. Birthdate: 1961/04/14 Sex: male Admission Date (Current Location): 09/22/2017  [place]   Facility and Address:  [facility], [address], [place], [place] [address]      Provider Number: [id number]  Attending Physician Name and Address:  Alford HighlandWieting, Richard, MD  Relative Name and Phone Number:       Current Level of Care: Hospital Recommended Level of Care: Other (Comment)(Group Home. ) Prior Approval Number:    Date Approved/Denied:   PASRR Number: [id number])  Discharge Plan: Domiciliary (Rest home)    Current Diagnoses: Patient Active Problem List   Diagnosis Date Noted  . Diabetic foot infection (HCC) 09/25/2017  . Foot pain 09/24/2017  . Acute osteomyelitis of left foot (HCC) 09/23/2017  . Hypernatremia   . Candidal pneumonia (HCC)   . Abdominal wall cellulitis   . Bacteremia due to Streptococcus   . Dysphagia   . OSA (obstructive sleep apnea)   . Obesity hypoventilation syndrome (HCC)   . Bipolar I disorder (HCC)   . Other specified hypothyroidism   . Tracheostomy care (HCC)   . C. difficile colitis   . Acute respiratory failure with hypoxia (HCC)   . Encounter for imaging study to confirm nasogastric (NG) tube placement   . History of ETT   . Acute respiratory failure (HCC)   . ARDS (adult respiratory distress syndrome) (HCC)   . Severe sepsis (HCC)   . Acute renal failure (HCC)   . Respiratory failure (HCC) 10/04/2015  . Sepsis (HCC) 10/02/2015  . Allergic rhinitis, cause unspecified 02/24/2014  . Unspecified constipation 02/24/2014  . Essential hypertension, benign 02/24/2014  . UTI (lower urinary tract infection) 01/16/2014  . Altered mental status 01/16/2014  . Diabetic osteomyelitis (HCC) 01/03/2014  . Diabetic neuropathy (HCC) 01/03/2014  . Bipolar disorder, unspecified (HCC)  01/03/2014  . Unspecified hypothyroidism 01/03/2014  . Obstructive sleep apnea 01/03/2014  . Asthma, chronic 01/03/2014  . Seborrheic dermatitis of scalp 01/03/2014  . Morbid obesity (HCC) 01/03/2014    Orientation RESPIRATION BLADDER Height & Weight     Self, Time, Situation, Place  Normal Continent Weight: 266 lb 11.2 oz (121 kg) Height:  5\' 11"  (180.3 cm)  BEHAVIORAL SYMPTOMS/MOOD NEUROLOGICAL BOWEL NUTRITION STATUS      Continent Diet(Diet: Heart Healthy/ Carb Modified. )  AMBULATORY STATUS COMMUNICATION OF NEEDS Skin   Limited Assist Verbally PU Stage and Appropriate Care(Diabetic Ulcer Left Foot. )                       Personal Care Assistance Level of Assistance  Bathing, Feeding, Dressing Bathing Assistance: Limited assistance Feeding assistance: Independent Dressing Assistance: Limited assistance     Functional Limitations Info  Sight, Hearing, Speech Sight Info: Adequate Hearing Info: Adequate Speech Info: Adequate    SPECIAL CARE FACTORS FREQUENCY  PT (By licensed PT)     PT Frequency: (2-3 home health PT and wound care. )              Contractures      Additional Factors Info  Code Status, Allergies, Insulin Sliding Scale Code Status Info: (Full Code. ) Allergies Info: (No Known Allergies. )   Insulin Sliding Scale Info: (MRSA Nasal Swab. )      Discharge Medications: Please see discharge summary for a list of discharge medications. Medication List  STOP taking these medications   amLODipine 10 MG tablet Commonly known as:  NORVASC   feeding supplement (VITAL AF 1.2 CAL) Liqd   free water Soln   insulin aspart 100 UNIT/ML injection Commonly known as:  novoLOG     TAKE these medications   acetaminophen 325 MG tablet Commonly known as:  TYLENOL Take 650 mg by mouth every 4 (four) hours as needed.   albuterol 108 (90 Base) MCG/ACT inhaler Commonly known as:  PROVENTIL HFA;VENTOLIN HFA Inhale 2 puffs into the lungs  every 6 (six) hours as needed for wheezing or shortness of breath.   amoxicillin 500 MG capsule Commonly known as:  AMOXIL Take 1 capsule (500 mg total) by mouth every 8 (eight) hours.   atorvastatin 40 MG tablet Commonly known as:  LIPITOR Take 40 mg by mouth daily.   azelastine 0.1 % nasal spray Commonly known as:  ASTELIN Place 1 spray into both nostrils 2 (two) times daily. Use in each nostril as directed   bismuth subsalicylate 262 MG/15ML suspension Commonly known as:  PEPTO BISMOL Take 30 mLs by mouth every 6 (six) hours as needed.   BREO ELLIPTA 100-25 MCG/INH Aepb Generic drug:  fluticasone furoate-vilanterol Inhale 1 puff into the lungs daily.   cholecalciferol 1000 units tablet Commonly known as:  VITAMIN D Take 1,000 Units by mouth daily.   famotidine 20 MG tablet Commonly known as:  PEPCID Take 20 mg by mouth once.   ferrous sulfate 325 (65 FE) MG tablet Take 325 mg by mouth 2 (two) times daily with a meal.   GERI-LANTA 200-200-20 MG/5ML suspension Generic drug:  alum & mag hydroxide-simeth Take by mouth as needed for indigestion or heartburn.   guaifenesin 100 MG/5ML syrup Commonly known as:  ROBITUSSIN Take 200 mg by mouth 3 (three) times daily as needed for cough.   levothyroxine 100 MCG tablet Commonly known as:  SYNTHROID, LEVOTHROID Take 1 tablet (100 mcg total) by mouth daily before breakfast. What changed:  how much to take   lisinopril 2.5 MG tablet Commonly known as:  PRINIVIL,ZESTRIL Take 2.5 mg by mouth daily.   loperamide 2 MG tablet Commonly known as:  IMODIUM A-D Take 2 mg by mouth as needed for diarrhea or loose stools.   metoprolol tartrate 100 MG tablet Commonly known as:  LOPRESSOR Take 1 tablet (100 mg total) by mouth 2 (two) times daily.   MILK OF MAGNESIA 400 MG/5ML suspension Generic drug:  magnesium hydroxide Take by mouth daily as needed for mild constipation.   montelukast 10 MG tablet Commonly known  as:  SINGULAIR Take 10 mg by mouth daily.   nystatin powder Generic drug:  nystatin Apply topically 4 (four) times daily as needed.   oxcarbazepine 600 MG tablet Commonly known as:  TRILEPTAL Take 600 mg by mouth 2 (two) times daily.   pantoprazole 40 MG tablet Commonly known as:  PROTONIX Take 1 tablet (40 mg total) by mouth daily at 12 noon.   senna 8.6 MG tablet Commonly known as:  SENOKOT Take 1 tablet by mouth at bedtime as needed for constipation.   SPIRIVA RESPIMAT 2.5 MCG/ACT Aers Generic drug:  Tiotropium Bromide Monohydrate Inhale 1 spray into the lungs daily.   traZODone 100 MG tablet Commonly known as:  DESYREL Take 100 mg by mouth at bedtime.   ziprasidone 40 MG capsule Commonly known as:  GEODON Take 40 mg by mouth 2 (two) times daily with a meal  Relevant Imaging Results: Relevant Lab  Results: Additional Information (SSN: [id number])  Ricci Dirocco, Darleen Crocker, LCSW

## 2017-09-25 NOTE — Progress Notes (Signed)
Called EMS for transport. 

## 2017-09-25 NOTE — Clinical Social Work Note (Signed)
Clinical Social Work Assessment  Patient Details  Name: Adrian [name]. MRN: [id number] Date of Birth: 1961-08-02  Date of referral:  09/25/17               Reason for consult:  Other (Comment Required)(From Group Home. )                Permission sought to share information with:  Facility Art therapist granted to share information::  Yes, Verbal Permission Granted  Name::        Agency::     Relationship::     Contact Information:     Housing/Transportation Living arrangements for the past 2 months:  Group Home Source of Information:  Patient, Facility Patient Interpreter Needed:  None Criminal Activity/Legal Involvement Pertinent to Current Situation/Hospitalization:  No - Comment as needed Significant Relationships:  Friend Lives with:  Facility Resident Do you feel safe going back to the place where you live?  Yes Need for family participation in patient care:  Yes (Comment)  Care giving concerns:  Patient is a resident at [facility] located at [place] [address].    Social Worker assessment / plan:  Holiday representative (CSW) received verbal consult from MD that patient is from a group home and is ready to return today. Per MD patient will require home health for PT and wound care (needs the wound in his foot packed). Per RN case manager home health has been arranged. CSW contacted group home owner Adrian [name] who reported that patient can return today and requested EMS for transport. RN will arrange EMS and send patient with enough amoxicillin to get him through Wednesday at [facility]'s request because they can't get medication from their pharmacy due to the weather. CSW met with patient and made him aware of above. Patient was very pleasant and agreeable to return to the group home today. CSW contacted patient's friend Adrian [name] and made her aware of above. CSW sent D/C summary and FL2 to [facility] and prepared D/C packet. Please  reconsult if future social work needs arise. CSW signing off.   Employment status:  Disabled (Comment on whether or not currently receiving Disability) Insurance information:  Medicare PT Recommendations:  Home with [place] / Referral to community resources:  Other (Comment Required)(Patient will return to the group home. )  Patient/Family's Response to care:  Patient is agreeable to return to [facility] today.   Patient/Family's Understanding of and Emotional Response to Diagnosis, Current Treatment, and Prognosis:  Patient and his friend Adrian [name] were very pleasant and thanked CSW for assistance.   Emotional Assessment Appearance:  Appears stated age Attitude/Demeanor/Rapport:    Affect (typically observed):  Accepting, Adaptable, Pleasant Orientation:  Oriented to Self, Oriented to Place, Oriented to  Time, Oriented to Situation Alcohol / Substance use:  Not Applicable Psych involvement (Current and /or in the community):  No (Comment)  Discharge Needs  Concerns to be addressed:  Discharge Planning Concerns Readmission within the last 30 days:  No Current discharge risk:  None Barriers to Discharge:  No Barriers Identified   Shiara Mcgough, Veronia Beets, LCSW 09/25/2017, 10:57 AM

## 2017-09-25 NOTE — Care Management (Signed)
Barbara CowerJason with [facility] notified of patient discharge back to facility today. No other RNCM needs.

## 2017-09-27 ENCOUNTER — Telehealth: Payer: Self-pay | Admitting: Podiatry

## 2017-09-27 DIAGNOSIS — Z89432 Acquired absence of left foot: Secondary | ICD-10-CM

## 2017-09-27 LAB — WOUND CULTURE

## 2017-09-27 LAB — CULTURE, BLOOD (ROUTINE X 2)
CULTURE: NO GROWTH
Culture: NO GROWTH
SPECIAL REQUESTS: ADEQUATE

## 2017-09-27 NOTE — Telephone Encounter (Signed)
This is Dian QueenLynn Collins a PT with [facility]. I am calling to get a verbal approval for his physical therapy plan of care and frequency of care. We would like to see him twice a week for four weeks. We would also like approval for a new rolling walker with a seat. We will be working on gait training, strengthening, and fall prevention with the pt. If you would call those verbal order/approvals to me on either my cell [phone] or at my office [phone]. Thank you.

## 2017-09-28 LAB — AEROBIC/ANAEROBIC CULTURE W GRAM STAIN (SURGICAL/DEEP WOUND)
Gram Stain: NONE SEEN
Special Requests: NORMAL

## 2017-09-28 LAB — AEROBIC/ANAEROBIC CULTURE (SURGICAL/DEEP WOUND)

## 2017-09-28 NOTE — Addendum Note (Signed)
Addended by: Geraldine ContrasVENABLE, ANGELA D on: 09/28/2017 04:22 PM   Modules accepted: Orders

## 2017-09-28 NOTE — Telephone Encounter (Signed)
I returned Lynn's call with [facility], verbal orders given for 2xw x 4 weeks, gait training, fall prevention and strengthening via voice mail.

## 2017-10-16 ENCOUNTER — Telehealth: Payer: Self-pay | Admitting: Podiatry

## 2017-10-16 NOTE — Telephone Encounter (Signed)
Left message informed Amy, RN - [facility] that we did not have a appt set for him at this time, that we recommended he go to the ED, and I would contact the pt. I spoke with Doristine MangoClement [phone] and he said pt is being [facility] by the doctor that did his surgery 10/18/2017 at 2:15pm. I asked if he would have a updated clinical note for the appt faxed to our office [phone] Attn: Angie.

## 2017-10-16 NOTE — Telephone Encounter (Signed)
This is Amy, RN with [facility]. I'm out here seeing [name] today. I wanted to touch base with you guys because I don't know when his next appointment is but his wound seems to be getting worse on his left foot. If you can, give me a call back at [phone] and if by chance I do not answer please leave me a detailed message as I may be with another pt. Thank you very much. Bye Bye

## 2017-10-27 ENCOUNTER — Ambulatory Visit: Payer: BLUE CROSS/BLUE SHIELD | Admitting: Podiatry

## 2017-10-30 ENCOUNTER — Inpatient Hospital Stay
Admission: EM | Admit: 2017-10-30 | Discharge: 2017-11-03 | DRG: 638 | Disposition: A | Discharge status: Skilled Nursing Facility | Payer: Medicare Other | Attending: Internal Medicine | Admitting: Internal Medicine

## 2017-10-30 ENCOUNTER — Emergency Department: Payer: Medicare Other

## 2017-10-30 DIAGNOSIS — I10 Essential (primary) hypertension: Secondary | ICD-10-CM | POA: Diagnosis present

## 2017-10-30 DIAGNOSIS — F17200 Nicotine dependence, unspecified, uncomplicated: Secondary | ICD-10-CM | POA: Diagnosis present

## 2017-10-30 DIAGNOSIS — F319 Bipolar disorder, unspecified: Secondary | ICD-10-CM | POA: Diagnosis present

## 2017-10-30 DIAGNOSIS — G4733 Obstructive sleep apnea (adult) (pediatric): Secondary | ICD-10-CM | POA: Diagnosis present

## 2017-10-30 DIAGNOSIS — J45909 Unspecified asthma, uncomplicated: Secondary | ICD-10-CM | POA: Diagnosis present

## 2017-10-30 DIAGNOSIS — Z8249 Family history of ischemic heart disease and other diseases of the circulatory system: Secondary | ICD-10-CM

## 2017-10-30 DIAGNOSIS — E039 Hypothyroidism, unspecified: Secondary | ICD-10-CM | POA: Diagnosis present

## 2017-10-30 DIAGNOSIS — R319 Hematuria, unspecified: Secondary | ICD-10-CM | POA: Diagnosis not present

## 2017-10-30 DIAGNOSIS — L03116 Cellulitis of left lower limb: Secondary | ICD-10-CM | POA: Diagnosis present

## 2017-10-30 DIAGNOSIS — E1140 Type 2 diabetes mellitus with diabetic neuropathy, unspecified: Secondary | ICD-10-CM | POA: Diagnosis present

## 2017-10-30 DIAGNOSIS — E1169 Type 2 diabetes mellitus with other specified complication: Principal | ICD-10-CM | POA: Diagnosis present

## 2017-10-30 DIAGNOSIS — Z823 Family history of stroke: Secondary | ICD-10-CM | POA: Diagnosis not present

## 2017-10-30 DIAGNOSIS — Z7951 Long term (current) use of inhaled steroids: Secondary | ICD-10-CM | POA: Diagnosis not present

## 2017-10-30 DIAGNOSIS — E11621 Type 2 diabetes mellitus with foot ulcer: Secondary | ICD-10-CM | POA: Diagnosis present

## 2017-10-30 DIAGNOSIS — E785 Hyperlipidemia, unspecified: Secondary | ICD-10-CM | POA: Diagnosis present

## 2017-10-30 DIAGNOSIS — L97429 Non-pressure chronic ulcer of left heel and midfoot with unspecified severity: Secondary | ICD-10-CM | POA: Diagnosis present

## 2017-10-30 DIAGNOSIS — F419 Anxiety disorder, unspecified: Secondary | ICD-10-CM | POA: Diagnosis present

## 2017-10-30 DIAGNOSIS — E662 Morbid (severe) obesity with alveolar hypoventilation: Secondary | ICD-10-CM | POA: Diagnosis present

## 2017-10-30 DIAGNOSIS — Z6837 Body mass index (BMI) 37.0-37.9, adult: Secondary | ICD-10-CM | POA: Diagnosis not present

## 2017-10-30 DIAGNOSIS — Z79890 Hormone replacement therapy (postmenopausal): Secondary | ICD-10-CM | POA: Diagnosis not present

## 2017-10-30 DIAGNOSIS — E038 Other specified hypothyroidism: Secondary | ICD-10-CM | POA: Diagnosis present

## 2017-10-30 DIAGNOSIS — L089 Local infection of the skin and subcutaneous tissue, unspecified: Secondary | ICD-10-CM

## 2017-10-30 DIAGNOSIS — E11628 Type 2 diabetes mellitus with other skin complications: Secondary | ICD-10-CM | POA: Diagnosis present

## 2017-10-30 LAB — COMPREHENSIVE METABOLIC PANEL
ALK PHOS: 120 U/L (ref 38–126)
ALT: 18 U/L (ref 17–63)
ANION GAP: 9 (ref 5–15)
AST: 22 U/L (ref 15–41)
Albumin: 3.5 g/dL (ref 3.5–5.0)
BILIRUBIN TOTAL: 0.5 mg/dL (ref 0.3–1.2)
BUN: 19 mg/dL (ref 6–20)
CALCIUM: 8.2 mg/dL — AB (ref 8.9–10.3)
CO2: 22 mmol/L (ref 22–32)
CREATININE: 0.99 mg/dL (ref 0.61–1.24)
Chloride: 105 mmol/L (ref 101–111)
GFR calc Af Amer: >60 mL/min (ref >60)
GFR calc non Af Amer: >60 mL/min (ref >60)
GLUCOSE: 129 mg/dL — AB (ref 65–99)
Potassium: 3.4 mmol/L — ABNORMAL LOW (ref 3.5–5.1)
Sodium: 136 mmol/L (ref 135–145)
TOTAL PROTEIN: 6.6 g/dL (ref 6.5–8.1)

## 2017-10-30 LAB — CBC WITH DIFFERENTIAL/PLATELET
BASOS PCT: 0 %
Basophils Absolute: 0 10*3/uL (ref 0–0.1)
Eosinophils Absolute: 0.1 10*3/uL (ref 0–0.7)
Eosinophils Relative: 1 %
HEMATOCRIT: 29.1 % — AB (ref 40.0–52.0)
HEMOGLOBIN: 10 g/dL — AB (ref 13.0–18.0)
LYMPHS ABS: 1.6 10*3/uL (ref 1.0–3.6)
Lymphocytes Relative: 20 %
MCH: 30.3 pg (ref 26.0–34.0)
MCHC: 34.5 g/dL (ref 32.0–36.0)
MCV: 87.9 fL (ref 80.0–100.0)
MONOS PCT: 10 %
Monocytes Absolute: 0.8 10*3/uL (ref 0.2–1.0)
NEUTROS ABS: 5.6 10*3/uL (ref 1.4–6.5)
NEUTROS PCT: 69 %
Platelets: 198 10*3/uL (ref 150–440)
RBC: 3.31 MIL/uL — AB (ref 4.40–5.90)
RDW: 14.1 % (ref 11.5–14.5)
WBC: 8.1 10*3/uL (ref 3.8–10.6)

## 2017-10-30 LAB — LACTIC ACID, PLASMA: Lactic Acid, Venous: 1.4 mmol/L (ref 0.5–1.9)

## 2017-10-30 MED ORDER — PIPERACILLIN-TAZOBACTAM 3.375 G IVPB
3.3750 g | Freq: Three times a day (TID) | INTRAVENOUS | Status: DC
  Administered 2017-10-31 – 2017-11-02 (×7): 3.375 g via INTRAVENOUS
  Filled 2017-10-30 (×7): qty 50

## 2017-10-30 MED ORDER — VANCOMYCIN HCL IN DEXTROSE 1-5 GM/200ML-% IV SOLN
1000.0000 mg | Freq: Three times a day (TID) | INTRAVENOUS | Status: DC
  Administered 2017-10-31 (×3): 1000 mg via INTRAVENOUS
  Filled 2017-10-30 (×6): qty 200

## 2017-10-30 MED ORDER — PIPERACILLIN-TAZOBACTAM 3.375 G IVPB 30 MIN
3.3750 g | Freq: Once | INTRAVENOUS | Status: AC
  Administered 2017-10-30: 3.375 g via INTRAVENOUS
  Filled 2017-10-30: qty 50

## 2017-10-30 MED ORDER — VANCOMYCIN HCL IN DEXTROSE 1-5 GM/200ML-% IV SOLN
1000.0000 mg | Freq: Once | INTRAVENOUS | Status: AC
Start: 2017-10-30 — End: 2017-10-31
  Administered 2017-10-30: 1000 mg via INTRAVENOUS
  Filled 2017-10-30: qty 200

## 2017-10-30 NOTE — H&P (Signed)
[facility] [place] [facility]   PATIENT NAME: Adrian [name]    MR#:  [id number]  DATE OF BIRTH:  1961/03/02  DATE OF ADMISSION:  10/30/2017  PRIMARY CARE PHYSICIAN: [facility]   REQUESTING/REFERRING PHYSICIAN: Don PerkingVeronese, MD  CHIEF COMPLAINT:   Chief Complaint  Patient presents with  . Wound Infection    HISTORY OF PRESENT ILLNESS:  Adrian [name]  is a 57 y.o. male who presents with left diabetic foot ulcer.  Patient has been following with podiatry for a brief period of time for this ulcer.  He was seen by home health nursing today who felt like he had some worsening of the wound and tracking cellulitis up his leg.  He came to the hospital for evaluation.  Here he was found to have cellulitis of his left lower extremity and on imaging some new gas formation around his foot ulcer.  He was placed on IV antibiotics and hospitalist were called for admission  PAST MEDICAL HISTORY:   Past Medical History:  Diagnosis Date  . Asthma   . Bipolar disorder (HCC)   . Depression   . Diabetes mellitus without complication (HCC)   . Hyperlipemia   . Hypertension   . Hypothyroidism   . Sleep apnea, obstructive     PAST SURGICAL HISTORY:   Past Surgical History:  Procedure Laterality Date  . bone removed     bone removed in foot due to infection   . TONSILLECTOMY      SOCIAL HISTORY:   Social History   Tobacco Use  . Smoking status: Current Every Day Smoker  . Smokeless tobacco: Never Used  Substance Use Topics  . Alcohol use: No    FAMILY HISTORY:   Family History  Problem Relation Age of Onset  . Heart failure Mother   . Stroke Father     DRUG ALLERGIES:  No Known Allergies  MEDICATIONS AT HOME:   Prior to Admission medications   Medication Sig Start Date End Date Taking? Authorizing Provider  acetaminophen (TYLENOL) 325 MG tablet Take 650 mg by mouth every 4 (four) hours as needed for mild pain or fever.     Yes [provider]  albuterol (PROVENTIL HFA;VENTOLIN HFA) 108 (90 Base) MCG/ACT inhaler Inhale 2 puffs into the lungs every 6 (six) hours as needed for wheezing or shortness of breath.   Yes [provider]  alum & mag hydroxide-simeth (GERI-LANTA) 200-200-20 MG/5ML suspension Take 30 mLs by mouth as needed for indigestion or heartburn.    Yes [provider]  atorvastatin (LIPITOR) 40 MG tablet Take 40 mg by mouth daily.   Yes [provider]  azelastine (ASTELIN) 0.1 % nasal spray Place 1 spray into both nostrils 2 (two) times daily. Use in each nostril as directed   Yes [provider]  bismuth subsalicylate (PEPTO BISMOL) 262 MG/15ML suspension Take 30 mLs by mouth every 6 (six) hours as needed for indigestion.    Yes [provider]  cholecalciferol (VITAMIN D) 1000 units tablet Take 1,000 Units by mouth daily.   Yes [provider]  famotidine (PEPCID) 20 MG tablet Take 20 mg by mouth once.   Yes [provider]  ferrous sulfate 325 (65 FE) MG tablet Take 325 mg by mouth 2 (two) times daily with a meal.   Yes [provider]  fluticasone furoate-vilanterol (BREO ELLIPTA) 100-25 MCG/INH AEPB Inhale 1 puff into the lungs daily.   Yes [provider]  guaifenesin (ROBITUSSIN) 100 MG/5ML syrup Take 200 mg by mouth 3 (three) times daily as needed for cough.   Yes [provider]  levothyroxine (SYNTHROID, LEVOTHROID) 75 MCG tablet Take 75 mcg by mouth daily before breakfast.   Yes [provider]  lisinopril (PRINIVIL,ZESTRIL) 2.5 MG tablet Take 2.5 mg by mouth daily.   Yes [provider]  loperamide (IMODIUM A-D) 2 MG tablet Take 2 mg by mouth as needed for diarrhea or loose stools.   Yes [provider]  magnesium hydroxide (MILK OF MAGNESIA) 400 MG/5ML suspension Take by mouth daily as needed for mild constipation.   Yes [provider]  metoprolol  (LOPRESSOR) 100 MG tablet Take 1 tablet (100 mg total) by mouth 2 (two) times daily. 10/20/15  Yes Drema Dallas, MD  montelukast (SINGULAIR) 10 MG tablet Take 10 mg by mouth daily.   Yes [provider]  nystatin (MYCOSTATIN/NYSTOP) 100000 UNIT/GM POWD Apply 1 g topically 4 (four) times daily.    Yes [provider]  oxcarbazepine (TRILEPTAL) 600 MG tablet Take 600 mg by mouth 2 (two) times daily.   Yes [provider]  pantoprazole (PROTONIX) 40 MG tablet Take 1 tablet (40 mg total) by mouth daily at 12 noon. 10/20/15  Yes Drema Dallas, MD  senna (SENOKOT) 8.6 MG tablet Take 1 tablet by mouth at bedtime as needed for constipation.   Yes [provider]  Tiotropium Bromide Monohydrate (SPIRIVA RESPIMAT) 2.5 MCG/ACT AERS Inhale 1 spray into the lungs daily.   Yes [provider]  traZODone (DESYREL) 100 MG tablet Take 100 mg by mouth at bedtime.   Yes [provider]  ziprasidone (GEODON) 40 MG capsule Take 40 mg by mouth 2 (two) times daily with a meal.  09/07/15  Yes [provider]    REVIEW OF SYSTEMS:  Review of Systems  Constitutional: Negative for chills, fever, malaise/fatigue and weight loss.  HENT: Negative for ear pain, hearing loss and tinnitus.   Eyes: Negative for blurred vision, double vision, pain and redness.  Respiratory: Negative for cough, hemoptysis and shortness of breath.   Cardiovascular: Negative for chest pain, palpitations, orthopnea and leg swelling.  Gastrointestinal: Negative for abdominal pain, constipation, diarrhea, nausea and vomiting.  Genitourinary: Negative for dysuria, frequency and hematuria.  Musculoskeletal: Negative for back pain, joint pain and neck pain.  Skin:       Erythema of left leg  Neurological: Negative for dizziness, tremors, focal weakness and weakness.  Endo/Heme/Allergies: Negative for polydipsia. Does not bruise/bleed easily.  Psychiatric/Behavioral: Negative for  depression. The patient is not nervous/anxious and does not have insomnia.      VITAL SIGNS:   Vitals:   10/30/17 2035 10/30/17 2036 10/30/17 2300  BP: (!) 121/104  125/66  Pulse: 64  61  Resp: 17  18  Temp: 98.9 F (37.2 C)    SpO2: 98%  98%  Weight:  120.7 kg (266 lb)   Height:  5\' 11"  (1.803 m)    Wt Readings from Last 3 Encounters:  10/30/17 120.7 kg (266 lb)  09/25/17 121 kg (266 lb 11.2 oz)  10/20/15 (!) 187 kg (412 lb 4.2 oz)    PHYSICAL EXAMINATION:  Physical Exam  Vitals reviewed. Constitutional: He is oriented to person, place, and time. He appears well-developed and well-nourished. No distress.  HENT:  Head: Normocephalic and atraumatic.  Mouth/Throat: Oropharynx is clear and moist.  Eyes: Conjunctivae and EOM are normal. Pupils are equal,  round, and reactive to light. No scleral icterus.  Neck: Normal range of motion. Neck supple. No JVD present. No thyromegaly present.  Cardiovascular: Normal rate, regular rhythm and intact distal pulses. Exam reveals no gallop and no friction rub.  No murmur heard. Respiratory: Effort normal and breath sounds normal. No respiratory distress. He has no wheezes. He has no rales.  GI: Soft. Bowel sounds are normal. He exhibits no distension. There is no tenderness.  Musculoskeletal: Normal range of motion. He exhibits no edema.  No arthritis, no gout  Lymphadenopathy:    He has no cervical adenopathy.  Neurological: He is alert and oriented to person, place, and time. No cranial nerve deficit.  No dysarthria, no aphasia  Skin: Skin is warm and dry. No rash noted. There is erythema (on left leg with tenderness and warmth).  Open ulcer left heel  Psychiatric: He has a normal mood and affect. His behavior is normal. Judgment and thought content normal.    LABORATORY PANEL:   CBC Recent Labs  Lab 10/30/17 2038  WBC 8.1  HGB 10.0*  HCT 29.1*  PLT 198    ------------------------------------------------------------------------------------------------------------------  Chemistries  Recent Labs  Lab 10/30/17 2038  NA 136  K 3.4*  CL 105  CO2 22  GLUCOSE 129*  BUN 19  CREATININE 0.99  CALCIUM 8.2*  AST 22  ALT 18  ALKPHOS 120  BILITOT 0.5   ------------------------------------------------------------------------------------------------------------------  Cardiac Enzymes No results for input(s): TROPONINI in the last 168 hours. ------------------------------------------------------------------------------------------------------------------  RADIOLOGY:  Dg Foot Complete Left  Result Date: 10/30/2017 CLINICAL DATA:  Possible worsening infection in this patient with a wound involving the lateral left foot, currently being treated at the wound center. EXAM: LEFT FOOT - COMPLETE 3+ VIEW COMPARISON:  Left foot MRI 09/23/2017, 08/11/2014. Left foot x-rays 09/22/2017. FINDINGS: Gas is now present within the soft tissue wound involving the lateral aspect of the midfoot and forefoot. No convincing evidence of acute osteomyelitis involving the base of the fourth metatarsal near the gas-filled wound. Severe diffuse soft tissue swelling. Prior resection of most of the fifth metatarsal. Osseous demineralization. Narrowing of the tarsal-metatarsal joint spaces, the first through fifth MTP joint spaces, and narrowing of the IP joint spaces of the toes. IMPRESSION: 1. Gas within the previously described wound involving the lateral aspect of the left midfoot and forefoot. 2. No convincing evidence of osteomyelitis. No acute osseous abnormalities. Electronically Signed   By: Hulan Saas M.D.   On: 10/30/2017 21:14    EKG:   Orders placed or performed during the hospital encounter of 10/04/15  . EKG 12-Lead  . EKG 12-Lead  . EKG 12-Lead  . EKG 12-Lead  . EKG 12-Lead  . EKG 12-Lead  . EKG 12-Lead  . EKG 12-Lead  . EKG 12-Lead  . EKG  12-Lead  . EKG 12-Lead  . EKG 12-Lead    IMPRESSION AND PLAN:  Principal Problem:   Diabetic foot infection (HCC) -IV antibiotics started, patient has corresponding cellulitis tracking of his left lower extremity.  Podiatry consult placed, patient states that he has seen Dr. Ether Griffins for this. Active Problems:   Asthma, chronic -continue home dose inhalers   Essential hypertension, benign -continue home meds   Diabetes (HCC) -findings scale insulin with corresponding glucose checks   OSA (obstructive sleep apnea) -CPAP nightly   Other specified hypothyroidism -home dose thyroid replacement  All the records are reviewed and case discussed with ED provider. Management plans discussed with the patient and/or family.  DVT PROPHYLAXIS: SubQ lovenox  GI PROPHYLAXIS: PPI at home dose  ADMISSION STATUS: Inpatient  CODE STATUS: Full Code Status History    Date Active Date Inactive Code Status Order ID Comments User Context   09/23/2017 01:12 09/25/2017 15:31 Full Code [id number]  Bertrum Sol, MD Inpatient   10/04/2015 22:26 10/20/2015 20:45 Full Code [id number]  Candie Chroman, NP Inpatient   10/04/2015 21:53 10/04/2015 22:26 Full Code [id number]  Candie Chroman, NP Inpatient   10/02/2015 14:32 10/04/2015 21:53 Full Code [id number]  Katharina Caper, MD Inpatient   01/16/2014 03:34 01/17/2014 18:06 Full Code [id number]  Hillary Bow, DO ED    Advance Directive Documentation     Most Recent Value  Type of Advance Directive  Healthcare Power of Attorney  Pre-existing out of facility DNR order (yellow form or pink MOST form)  No data  "MOST" Form in Place?  No data      TOTAL TIME TAKING CARE OF THIS PATIENT: 45 minutes.   Gerrick Ray FIELDING 10/30/2017, 11:30 PM  [facility]  Office  [phone]  CC: Primary care physician; [facility]  Note:  This document was prepared using Conservation officer, historic buildings and may include  unintentional dictation errors.

## 2017-10-30 NOTE — ED Triage Notes (Signed)
Patient reports wound to left foot. Patient is being followed by wound health, sees RN 3X per week. RN and physician recommended patient be seen at ED for possible worsening infection.    Wound is approx 1 1/2 - 2" in diameter with purulent drainage. Patient has area of redness on lower leg extending up towards knee.

## 2017-10-30 NOTE — ED Notes (Signed)
Pt updated on admission status.

## 2017-10-30 NOTE — ED Triage Notes (Signed)
Rep from group home, [facility] [phone]. Contact for discharge and patient updates.

## 2017-10-30 NOTE — Progress Notes (Signed)
Pharmacy Antibiotic Note  Adrian Adrian B Kubicek Jr. is a 57 y.o. male admitted on 10/30/2017 with diabetic foot infection.  Pharmacy has been consulted for vanc/zosyn dosing.  Plan: Patient received vanc 1g and zosyn 3.375g IV x 1 in ED  Will f/u w/ vanc 1g IV q8h w/ 6 hour stack dose Will draw vanc trough 1/16 @ 0400 prior to 4th dose Will continue zosyn 3.375g IV q8h  Ke 0.0958 T1/2 8 hrs Goal trough 15 - 20 mcg/mL  Height: 5\' 11"  (180.3 cm) Weight: 266 lb (120.7 kg) IBW/kg (Calculated) : 75.3  Temp (24hrs), Avg:98.9 F (37.2 C), Min:98.9 F (37.2 C), Max:98.9 F (37.2 C)  Recent Labs  Lab 10/30/17 2038  WBC 8.1  CREATININE 0.99    Estimated Creatinine Clearance: 110.2 mL/min (by C-G formula based on SCr of 0.99 mg/dL).    No Known Allergies  Thank you for allowing pharmacy to be a part of this patient's care.  Adrian [name], PharmD, BCPS Clinical Pharmacist 10/30/2017

## 2017-10-30 NOTE — ED Provider Notes (Signed)
[facility] Emergency Department Provider Note  ____________________________________________  Time seen: Approximately 9:58 PM  I have reviewed the triage vital signs and the nursing notes.   HISTORY  Chief Complaint Wound Infection   HPI Adrian DiegoCarlton B Pyeatt Jr. is a 57 y.o. male with a history of diabetes, hypertension, hyperlipidemia, hypothyroidism, OSA, bipolar disorder, osteomyelitis who presents for evaluation of a wound infection. Patient is followed by the wound clinic for a left diabetic foot ulcer. Today he was seen by wound care nurse who noted some worsening redness and swelling around the ulcer and the left lower extremity and was concerned the patient might be developing an infection on his ulcer. Patient denies fever, chills, nausea, vomiting. He denies pain on his foot. He is followed by Dr. Ether GriffinsFowler from the wound clinic.  Chief Complaint: diabetic foot ulcer and cellulitis Location: L foot and L leg Severity: moderate Duration: few days Associated signs/symptoms: no pain, fever, nausea, or vomiting.    Past Medical History:  Diagnosis Date  . Asthma   . Bipolar disorder (HCC)   . Depression   . Diabetes mellitus without complication (HCC)   . Hyperlipemia   . Hypertension   . Hypothyroidism   . Sleep apnea, obstructive     Patient Active Problem List   Diagnosis Date Noted  . Diabetes (HCC) 10/30/2017  . Diabetic foot infection (HCC) 09/25/2017  . Foot pain 09/24/2017  . Acute osteomyelitis of left foot (HCC) 09/23/2017  . Hypernatremia   . Candidal pneumonia (HCC)   . Abdominal wall cellulitis   . Bacteremia due to Streptococcus   . Dysphagia   . OSA (obstructive sleep apnea)   . Obesity hypoventilation syndrome (HCC)   . Bipolar I disorder (HCC)   . Other specified hypothyroidism   . Tracheostomy care (HCC)   . C. difficile colitis   . Acute respiratory failure with hypoxia (HCC)   . Encounter for imaging study to  confirm nasogastric (NG) tube placement   . History of ETT   . Acute respiratory failure (HCC)   . ARDS (adult respiratory distress syndrome) (HCC)   . Severe sepsis (HCC)   . Acute renal failure (HCC)   . Respiratory failure (HCC) 10/04/2015  . Sepsis (HCC) 10/02/2015  . Allergic rhinitis, cause unspecified 02/24/2014  . Unspecified constipation 02/24/2014  . Essential hypertension, benign 02/24/2014  . UTI (lower urinary tract infection) 01/16/2014  . Altered mental status 01/16/2014  . Diabetic osteomyelitis (HCC) 01/03/2014  . Diabetic neuropathy (HCC) 01/03/2014  . Bipolar disorder, unspecified (HCC) 01/03/2014  . Unspecified hypothyroidism 01/03/2014  . Obstructive sleep apnea 01/03/2014  . Asthma, chronic 01/03/2014  . Seborrheic dermatitis of scalp 01/03/2014  . Morbid obesity (HCC) 01/03/2014    Past Surgical History:  Procedure Laterality Date  . bone removed     bone removed in foot due to infection   . TONSILLECTOMY      Prior to Admission medications   Medication Sig Start Date End Date Taking? Authorizing Provider  acetaminophen (TYLENOL) 325 MG tablet Take 650 mg by mouth every 4 (four) hours as needed for mild pain or fever.    Yes [provider]  albuterol (PROVENTIL HFA;VENTOLIN HFA) 108 (90 Base) MCG/ACT inhaler Inhale 2 puffs into the lungs every 6 (six) hours as needed for wheezing or shortness of breath.   Yes [provider]  alum & mag hydroxide-simeth (GERI-LANTA) 200-200-20 MG/5ML suspension Take 30 mLs by mouth as needed for indigestion or  heartburn.    Yes [provider]  atorvastatin (LIPITOR) 40 MG tablet Take 40 mg by mouth daily.   Yes [provider]  azelastine (ASTELIN) 0.1 % nasal spray Place 1 spray into both nostrils 2 (two) times daily. Use in each nostril as directed   Yes [provider]  bismuth subsalicylate (PEPTO BISMOL) 262 MG/15ML suspension Take 30 mLs by mouth every 6 (six) hours as  needed for indigestion.    Yes [provider]  cholecalciferol (VITAMIN D) 1000 units tablet Take 1,000 Units by mouth daily.   Yes [provider]  famotidine (PEPCID) 20 MG tablet Take 20 mg by mouth once.   Yes [provider]  ferrous sulfate 325 (65 FE) MG tablet Take 325 mg by mouth 2 (two) times daily with a meal.   Yes [provider]  fluticasone furoate-vilanterol (BREO ELLIPTA) 100-25 MCG/INH AEPB Inhale 1 puff into the lungs daily.   Yes [provider]  guaifenesin (ROBITUSSIN) 100 MG/5ML syrup Take 200 mg by mouth 3 (three) times daily as needed for cough.   Yes [provider]  levothyroxine (SYNTHROID, LEVOTHROID) 75 MCG tablet Take 75 mcg by mouth daily before breakfast.   Yes [provider]  lisinopril (PRINIVIL,ZESTRIL) 2.5 MG tablet Take 2.5 mg by mouth daily.   Yes [provider]  loperamide (IMODIUM A-D) 2 MG tablet Take 2 mg by mouth as needed for diarrhea or loose stools.   Yes [provider]  magnesium hydroxide (MILK OF MAGNESIA) 400 MG/5ML suspension Take by mouth daily as needed for mild constipation.   Yes [provider]  metoprolol (LOPRESSOR) 100 MG tablet Take 1 tablet (100 mg total) by mouth 2 (two) times daily. 10/20/15  Yes Drema Dallas, MD  montelukast (SINGULAIR) 10 MG tablet Take 10 mg by mouth daily.   Yes [provider]  nystatin (MYCOSTATIN/NYSTOP) 100000 UNIT/GM POWD Apply 1 g topically 4 (four) times daily.    Yes [provider]  oxcarbazepine (TRILEPTAL) 600 MG tablet Take 600 mg by mouth 2 (two) times daily.   Yes [provider]  pantoprazole (PROTONIX) 40 MG tablet Take 1 tablet (40 mg total) by mouth daily at 12 noon. 10/20/15  Yes Drema Dallas, MD  senna (SENOKOT) 8.6 MG tablet Take 1 tablet by mouth at bedtime as needed for constipation.   Yes [provider]  Tiotropium Bromide Monohydrate (SPIRIVA RESPIMAT) 2.5  MCG/ACT AERS Inhale 1 spray into the lungs daily.   Yes [provider]  traZODone (DESYREL) 100 MG tablet Take 100 mg by mouth at bedtime.   Yes [provider]  ziprasidone (GEODON) 40 MG capsule Take 40 mg by mouth 2 (two) times daily with a meal.  09/07/15  Yes [provider]    Allergies Patient has no known allergies.  Family History  Problem Relation Age of Onset  . Heart failure Mother   . Stroke Father     Social History Social History   Tobacco Use  . Smoking status: Current Every Day Smoker  . Smokeless tobacco: Never Used  Substance Use Topics  . Alcohol use: No  . Drug use: No    Review of Systems Constitutional: Negative for fever. Eyes: Negative for visual changes. ENT: Negative for sore throat. Neck: No neck pain  Cardiovascular: Negative for chest pain. Respiratory: Negative for shortness of breath. Gastrointestinal: Negative for abdominal pain, vomiting or diarrhea. Genitourinary: Negative for dysuria. Musculoskeletal: Negative for  back pain. + redness and swelling on the LLE Skin: Negative for rash. Neurological: Negative for headaches, weakness or numbness. Psych: No SI or HI  ____________________________________________   PHYSICAL EXAM:  VITAL SIGNS: ED Triage Vitals  Enc Vitals Group     BP 10/30/17 2035 (!) 121/104     Pulse Rate 10/30/17 2035 64     Resp 10/30/17 2035 17     Temp 10/30/17 2035 98.9 F (37.2 C)     Temp src --      SpO2 10/30/17 2035 98 %     Weight 10/30/17 2036 266 lb (120.7 kg)     Height 10/30/17 2036 5\' 11"  (1.803 m)     Head Circumference --      Peak Flow --      Pain Score 10/30/17 2035 4     Pain Loc --      Pain Edu? --      Excl. in GC? --     Constitutional: Alert and oriented. Well appearing and in no apparent distress. HEENT:      Head: Normocephalic and atraumatic.         Eyes: Conjunctivae are normal. Sclera is non-icteric.       Mouth/Throat: Mucous membranes are  moist.       Neck: Supple with no signs of meningismus. Cardiovascular: Regular rate and rhythm. No murmurs, gallops, or rubs. 2+ symmetrical distal pulses are present in all extremities. No JVD. Respiratory: Normal respiratory effort. Lungs are clear to auscultation bilaterally. No wheezes, crackles, or rhonchi.  Gastrointestinal: Soft, non tender, and non distended with positive bowel sounds. No rebound or guarding. Musculoskeletal: There is an open ulcer in the bottom of the L foot which is dry with no discharge. There is mild amount of erythema and warmth surrounding it. Neurologic: Normal speech and language. Face is symmetric. Moving all extremities. No gross focal neurologic deficits are appreciated. Skin: Erythema, warmth, and swelling on the LLE Psychiatric: Mood and affect are normal. Speech and behavior are normal.  ____________________________________________   LABS (all labs ordered are listed, but only abnormal results are displayed)  Labs Reviewed  CBC WITH DIFFERENTIAL/PLATELET - Abnormal; Notable for the following components:      Result Value   RBC 3.31 (*)    Hemoglobin 10.0 (*)    HCT 29.1 (*)    All other components within normal limits  COMPREHENSIVE METABOLIC PANEL - Abnormal; Notable for the following components:   Potassium 3.4 (*)    Glucose, Bld 129 (*)    Calcium 8.2 (*)    All other components within normal limits  CULTURE, BLOOD (ROUTINE X 2)  CULTURE, BLOOD (ROUTINE X 2)  LACTIC ACID, PLASMA   ____________________________________________  EKG  none  ____________________________________________  RADIOLOGY  XR foot:  1. Gas within the previously described wound involving the lateral aspect of the left midfoot and forefoot. 2. No convincing evidence of osteomyelitis. No acute osseous abnormalities. ____________________________________________   PROCEDURES  Procedure(s) performed: None Procedures Critical Care performed:   None ____________________________________________   INITIAL IMPRESSION / ASSESSMENT AND PLAN / ED COURSE   57 y.o. male with a history of diabetes, hypertension, hyperlipidemia, hypothyroidism, OSA, bipolar disorder, osteomyelitis who presents for evaluation of a wound infection. Presentation concerning for cellulitis of the left lower extremity, diabetic foot ulcer with no purulent discharge but x-ray showing gas which is new when compared to prior. Based on the findings of the x-ray, the fact the patient is a  diabetic I do believe patient warrants admission for IV antibiotics. I will start patient on Zosyn and vancomycin, get lactic acid and blood cultures and plan to admit for IV abx.      As part of my medical decision making, I reviewed the following data within the electronic MEDICAL RECORD NUMBER Nursing notes reviewed and incorporated, Labs reviewed , Radiograph reviewed , Discussed with admitting physician , Notes from prior ED visits and [place] Controlled Substance Database    Pertinent labs & imaging results that were available during my care of the patient were reviewed by me and considered in my medical decision making (see chart for details).    ____________________________________________   FINAL CLINICAL IMPRESSION(S) / ED DIAGNOSES  Final diagnoses:  Cellulitis of left lower extremity  Diabetic ulcer of left midfoot associated with type 2 diabetes mellitus, unspecified ulcer stage (HCC)      NEW MEDICATIONS STARTED DURING THIS VISIT:  ED Discharge Orders    None       Note:  This document was prepared using Dragon voice recognition software and may include unintentional dictation errors.    Nita Sickle, MD 10/30/17 2330

## 2017-10-31 ENCOUNTER — Other Ambulatory Visit: Payer: Self-pay

## 2017-10-31 LAB — GLUCOSE, CAPILLARY
Glucose-Capillary: 113 mg/dL — ABNORMAL HIGH (ref 65–99)
Glucose-Capillary: 91 mg/dL (ref 65–99)
Glucose-Capillary: 105 mg/dL — ABNORMAL HIGH (ref 65–99)
Glucose-Capillary: 98 mg/dL (ref 65–99)
Glucose-Capillary: 99 mg/dL (ref 65–99)

## 2017-10-31 LAB — BASIC METABOLIC PANEL
Anion gap: 10 (ref 5–15)
BUN: 15 mg/dL (ref 6–20)
CHLORIDE: 106 mmol/L (ref 101–111)
CO2: 24 mmol/L (ref 22–32)
CREATININE: 1.04 mg/dL (ref 0.61–1.24)
Calcium: 8.2 mg/dL — ABNORMAL LOW (ref 8.9–10.3)
GFR calc Af Amer: >60 mL/min (ref >60)
GFR calc non Af Amer: >60 mL/min (ref >60)
Glucose, Bld: 103 mg/dL — ABNORMAL HIGH (ref 65–99)
Potassium: 3.4 mmol/L — ABNORMAL LOW (ref 3.5–5.1)
SODIUM: 140 mmol/L (ref 135–145)

## 2017-10-31 LAB — CBC
HCT: 29.4 % — ABNORMAL LOW (ref 40.0–52.0)
Hemoglobin: 10 g/dL — ABNORMAL LOW (ref 13.0–18.0)
MCH: 30.6 pg (ref 26.0–34.0)
MCHC: 34.1 g/dL (ref 32.0–36.0)
MCV: 89.7 fL (ref 80.0–100.0)
Platelets: 160 10*3/uL (ref 150–440)
RBC: 3.27 MIL/uL — ABNORMAL LOW (ref 4.40–5.90)
RDW: 14.3 % (ref 11.5–14.5)
WBC: 7 10*3/uL (ref 3.8–10.6)

## 2017-10-31 LAB — URINALYSIS, COMPLETE (UACMP) WITH MICROSCOPIC
Bilirubin Urine: NEGATIVE
GLUCOSE, UA: NEGATIVE mg/dL
KETONES UR: NEGATIVE mg/dL
Nitrite: NEGATIVE
PH: 6 (ref 5.0–8.0)
Protein, ur: NEGATIVE mg/dL
SPECIFIC GRAVITY, URINE: 1.005 (ref 1.005–1.030)
SQUAMOUS EPITHELIAL / LPF: NONE SEEN

## 2017-10-31 LAB — MRSA PCR SCREENING: MRSA BY PCR: POSITIVE — AB

## 2017-10-31 MED ORDER — MUPIROCIN 2 % EX OINT
1.0000 "application " | TOPICAL_OINTMENT | Freq: Two times a day (BID) | CUTANEOUS | Status: DC
  Administered 2017-10-31 – 2017-11-03 (×7): 1 via NASAL
  Filled 2017-10-31: qty 22

## 2017-10-31 MED ORDER — CHLORHEXIDINE GLUCONATE CLOTH 2 % EX PADS
6.0000 | MEDICATED_PAD | Freq: Every day | CUTANEOUS | Status: DC
  Administered 2017-10-31 – 2017-11-03 (×4): 6 via TOPICAL

## 2017-10-31 MED ORDER — ONDANSETRON HCL 4 MG PO TABS: 4.0000 mg | ORAL_TABLET | Freq: Four times a day (QID) | ORAL | Status: DC | PRN

## 2017-10-31 MED ORDER — SODIUM CHLORIDE 0.9 % IV SOLN
INTRAVENOUS | Status: DC
  Administered 2017-10-31: 01:00:00 via INTRAVENOUS

## 2017-10-31 MED ORDER — ACETAMINOPHEN 650 MG RE SUPP: 650.0000 mg | Freq: Four times a day (QID) | RECTAL | Status: DC | PRN

## 2017-10-31 MED ORDER — TRAZODONE HCL 100 MG PO TABS
100.0000 mg | ORAL_TABLET | Freq: Every day | ORAL | Status: DC
  Administered 2017-10-31 – 2017-11-02 (×3): 100 mg via ORAL
  Filled 2017-10-31 (×3): qty 1

## 2017-10-31 MED ORDER — FLUTICASONE FUROATE-VILANTEROL 100-25 MCG/INH IN AEPB
1.0000 | INHALATION_SPRAY | Freq: Every day | RESPIRATORY_TRACT | Status: DC
  Administered 2017-10-31 – 2017-11-03 (×4): 1 via RESPIRATORY_TRACT
  Filled 2017-10-31: qty 28

## 2017-10-31 MED ORDER — ATORVASTATIN CALCIUM 20 MG PO TABS
40.0000 mg | ORAL_TABLET | Freq: Every day | ORAL | Status: DC
  Administered 2017-11-01 – 2017-11-02 (×2): 40 mg via ORAL
  Filled 2017-10-31 (×2): qty 2

## 2017-10-31 MED ORDER — ALBUTEROL SULFATE (2.5 MG/3ML) 0.083% IN NEBU: 2.5000 mg | INHALATION_SOLUTION | Freq: Four times a day (QID) | RESPIRATORY_TRACT | Status: DC | PRN

## 2017-10-31 MED ORDER — LISINOPRIL 5 MG PO TABS
2.5000 mg | ORAL_TABLET | Freq: Every day | ORAL | Status: DC
  Administered 2017-11-02 – 2017-11-03 (×2): 2.5 mg via ORAL
  Filled 2017-10-31 (×2): qty 1

## 2017-10-31 MED ORDER — ZIPRASIDONE HCL 40 MG PO CAPS
40.0000 mg | ORAL_CAPSULE | Freq: Two times a day (BID) | ORAL | Status: DC
  Administered 2017-10-31 – 2017-11-03 (×7): 40 mg via ORAL
  Filled 2017-10-31 (×7): qty 1

## 2017-10-31 MED ORDER — INSULIN ASPART 100 UNIT/ML ~~LOC~~ SOLN: 0.0000 [IU] | Freq: Four times a day (QID) | SUBCUTANEOUS | Status: DC

## 2017-10-31 MED ORDER — PANTOPRAZOLE SODIUM 40 MG PO TBEC
40.0000 mg | DELAYED_RELEASE_TABLET | Freq: Every day | ORAL | Status: DC
Start: 2017-10-31 — End: 2017-11-03
  Administered 2017-10-31 – 2017-11-03 (×4): 40 mg via ORAL
  Filled 2017-10-31 (×4): qty 1

## 2017-10-31 MED ORDER — MONTELUKAST SODIUM 10 MG PO TABS
10.0000 mg | ORAL_TABLET | Freq: Every day | ORAL | Status: DC
  Administered 2017-10-31 – 2017-11-03 (×4): 10 mg via ORAL
  Filled 2017-10-31 (×4): qty 1

## 2017-10-31 MED ORDER — INSULIN ASPART 100 UNIT/ML ~~LOC~~ SOLN: 0.0000 [IU] | Freq: Three times a day (TID) | SUBCUTANEOUS | Status: DC

## 2017-10-31 MED ORDER — OXYCODONE HCL 5 MG PO TABS
5.0000 mg | ORAL_TABLET | ORAL | Status: DC | PRN
  Administered 2017-10-31: 5 mg via ORAL
  Filled 2017-10-31: qty 1

## 2017-10-31 MED ORDER — LEVOTHYROXINE SODIUM 50 MCG PO TABS
75.0000 ug | ORAL_TABLET | Freq: Every day | ORAL | Status: DC
  Administered 2017-10-31 – 2017-11-03 (×4): 75 ug via ORAL
  Filled 2017-10-31 (×4): qty 1

## 2017-10-31 MED ORDER — METOPROLOL TARTRATE 50 MG PO TABS
100.0000 mg | ORAL_TABLET | Freq: Two times a day (BID) | ORAL | Status: DC
  Administered 2017-11-01 – 2017-11-03 (×4): 100 mg via ORAL
  Filled 2017-10-31 (×5): qty 2

## 2017-10-31 MED ORDER — ONDANSETRON HCL 4 MG/2ML IJ SOLN: 4.0000 mg | Freq: Four times a day (QID) | INTRAMUSCULAR | Status: DC | PRN

## 2017-10-31 MED ORDER — OXCARBAZEPINE 300 MG PO TABS
600.0000 mg | ORAL_TABLET | Freq: Two times a day (BID) | ORAL | Status: DC
  Administered 2017-10-31 – 2017-11-03 (×7): 600 mg via ORAL
  Filled 2017-10-31 (×8): qty 2

## 2017-10-31 MED ORDER — TIOTROPIUM BROMIDE MONOHYDRATE 18 MCG IN CAPS
18.0000 ug | ORAL_CAPSULE | Freq: Every day | RESPIRATORY_TRACT | Status: DC
  Administered 2017-10-31 – 2017-11-03 (×4): 18 ug via RESPIRATORY_TRACT
  Filled 2017-10-31: qty 5

## 2017-10-31 MED ORDER — IBUPROFEN 400 MG PO TABS
400.0000 mg | ORAL_TABLET | Freq: Four times a day (QID) | ORAL | Status: DC | PRN
  Administered 2017-10-31: 400 mg via ORAL
  Filled 2017-10-31: qty 1

## 2017-10-31 MED ORDER — ACETAMINOPHEN 325 MG PO TABS
650.0000 mg | ORAL_TABLET | Freq: Four times a day (QID) | ORAL | Status: DC | PRN
  Administered 2017-10-31: 650 mg via ORAL
  Filled 2017-10-31: qty 2

## 2017-10-31 MED ORDER — ENOXAPARIN SODIUM 40 MG/0.4ML ~~LOC~~ SOLN
40.0000 mg | SUBCUTANEOUS | Status: DC
  Administered 2017-11-01 – 2017-11-03 (×3): 40 mg via SUBCUTANEOUS
  Filled 2017-10-31 (×3): qty 0.4

## 2017-10-31 MED ORDER — POTASSIUM CHLORIDE CRYS ER 20 MEQ PO TBCR
40.0000 meq | EXTENDED_RELEASE_TABLET | Freq: Once | ORAL | Status: AC
Start: 2017-10-31 — End: 2017-10-31
  Administered 2017-10-31: 40 meq via ORAL
  Filled 2017-10-31: qty 2

## 2017-10-31 NOTE — Progress Notes (Signed)
Patient's Temp 102.9. MD Sudini made aware. Tylenol PRN given

## 2017-10-31 NOTE — Clinical Social Work Note (Addendum)
Clinical Social Work Assessment  Patient Details  Name: Adrian [name]. MRN: [id number] Date of Birth: 10-Sep-1961  Date of referral:  10/31/17               Reason for consult:  Facility Placement, Discharge Planning                Permission sought to share information with:  Chartered certified accountant granted to share information::  Yes, Verbal Permission Granted  Name::      [place]::   [place]  Relationship::     Contact Information:     Housing/Transportation Living arrangements for the past 2 months:  [place] of Information:  Patient, Power of Attorney, Facility Patient Interpreter Needed:  None Criminal Activity/Legal Involvement Pertinent to Current Situation/Hospitalization:  No - Comment as needed Significant Relationships:  Friend Lives with:  Facility Resident Do you feel safe going back to the place where you live?  Yes Need for family participation in patient care:  No (Coment)  Care giving concerns: Patient has been a resident at [facility] in [place] for a year (fax: [phone]).   Social Worker assessment / plan: Holiday representative (CSW) reviewed patient's chart and noted that he is from a group home. Social work Theatre manager met with patient alone at bedside. Patient was sitting up alert and oriented x4. CSW introduced self and explained the role of the [place] department. Patient shared that he has been a resident at [facility] for a year and that his friend Adrian [name] [phone]) is his HPOA.  Patient shared that he is comfortable returning to [facility]. FL2 is completed. CSW contacted [place] group home owner to get additional information. Per Madelynn Done patient ambulates with a walker every day and has [place] home health for wound care. Per Madelynn Done patient has been at the group home for 1 year now and does not have a guardian however his friend  Adrian [name] is his HPOA. Per Madelynn Done patient is on room air at baseline and can return to the group home when stable. Per Madelynn Done he will transport patient. CSW also contacted patient's HPOA Adrian [name] and made her aware of above. Per Adrian [name] patient has been dealing with this wound for several years and stated that this is not a new problem. Per Adrian [name] she is agreeable for patient to return to the group home. CSW and social work Theatre manager will continue to follow up and assist. RN case manager aware that patient is open to [place].   Employment status:  Retired Forensic scientist:  Medicare PT Recommendations:  Not assessed at this time Information / Referral to community resources:     Patient/Family's Response to care: Patient is agreeable to return to [facility].  Patient/Family's Understanding of and Emotional Response to Diagnosis, Current Treatment, and Prognosis: Patient was pleasant to social work Theatre manager and thanked her for her assistance.  Emotional Assessment Appearance:  Appears stated age Attitude/Demeanor/Rapport:    Affect (typically observed):  Adaptable, Pleasant, Calm Orientation:  Oriented to Self, Oriented to Place, Oriented to  Time, Oriented to Situation Alcohol / Substance use:  Not Applicable Psych involvement (Current and /or in the community):  No (Comment)  Discharge Needs  Concerns to be addressed:  Care Coordination, Discharge Planning Concerns Readmission within the last 30 days:    Current discharge risk:  Dependent with Mobility Barriers to Discharge:  Continued Medical Work up   Smith Mince, [place] Work 10/31/2017, 11:32 AM

## 2017-10-31 NOTE — [place] FL2 (Addendum)
[place] MEDICAID FL2 LEVEL OF CARE SCREENING TOOL     IDENTIFICATION  Patient Name: Adrian DiegoCarlton B Mirabile Jr. Birthdate: Mar 15, 1961 Sex: male Admission Date (Current Location): 10/30/2017  [place]   Facility and Address:  [facility], [address], [place], [place] [address]      Provider Number: [id number]  Attending Physician Name and Address:  Milagros LollSudini, Srikar, MD  Relative Name and Phone Number:       Current Level of Care: Hospital Recommended Level of Care: Skilled Nursing Facility  Prior Approval Number:    Date Approved/Denied:   PASRR Number:   Discharge Plan: Skilled Nursing Facility     Current Diagnoses: Patient Active Problem List   Diagnosis Date Noted  . Diabetes (HCC) 10/30/2017  . Diabetic foot infection (HCC) 09/25/2017  . Foot pain 09/24/2017  . Acute osteomyelitis of left foot (HCC) 09/23/2017  . Hypernatremia   . Candidal pneumonia (HCC)   . Abdominal wall cellulitis   . Bacteremia due to Streptococcus   . Dysphagia   . OSA (obstructive sleep apnea)   . Obesity hypoventilation syndrome (HCC)   . Bipolar I disorder (HCC)   . Other specified hypothyroidism   . Tracheostomy care (HCC)   . C. difficile colitis   . Acute respiratory failure with hypoxia (HCC)   . Encounter for imaging study to confirm nasogastric (NG) tube placement   . History of ETT   . Acute respiratory failure (HCC)   . ARDS (adult respiratory distress syndrome) (HCC)   . Severe sepsis (HCC)   . Acute renal failure (HCC)   . Respiratory failure (HCC) 10/04/2015  . Sepsis (HCC) 10/02/2015  . Allergic rhinitis, cause unspecified 02/24/2014  . Unspecified constipation 02/24/2014  . Essential hypertension, benign 02/24/2014  . UTI (lower urinary tract infection) 01/16/2014  . Altered mental status 01/16/2014  . Diabetic osteomyelitis (HCC) 01/03/2014  . Diabetic neuropathy (HCC) 01/03/2014  . Bipolar disorder,  unspecified (HCC) 01/03/2014  . Unspecified hypothyroidism 01/03/2014  . Obstructive sleep apnea 01/03/2014  . Asthma, chronic 01/03/2014  . Seborrheic dermatitis of scalp 01/03/2014  . Morbid obesity (HCC) 01/03/2014    Orientation RESPIRATION BLADDER Height & Weight     Self, Time, Place, Situation  Normal Continent Weight: 266 lb (120.7 kg) Height:  5\' 11"  (180.3 cm)  BEHAVIORAL SYMPTOMS/MOOD NEUROLOGICAL BOWEL NUTRITION STATUS      Continent Diet(Carb Modified)  AMBULATORY STATUS COMMUNICATION OF NEEDS Skin   Extensive Assistance  Verbally (Diabetic Left Foot Ulcer)                       Personal Care Assistance Level of Assistance  Bathing, Feeding, Dressing Bathing Assistance: Limited assistance Feeding assistance: Independent Dressing Assistance: Limited assistance     Functional Limitations Info  Sight, Hearing, Speech Sight Info: Adequate Hearing Info: Adequate Speech Info: Adequate    SPECIAL CARE FACTORS FREQUENCY  PT and OT      PT and OT 5 days per week              Contractures      Additional Factors Info  Code Status, Allergies, Isolation Precautions Code Status Info: (Full Code) Allergies Info: (No Known Allergies)     Isolation Precautions Info: (Nasal Swab)     Current Medications (10/31/2017):  This is the current hospital active medication list Current Facility-Administered Medications  Medication Dose Route Frequency Provider Last Rate Last Dose  . acetaminophen (  TYLENOL) tablet 650 mg  650 mg Oral Q6H PRN Oralia Manis, MD       Or  . acetaminophen (TYLENOL) suppository 650 mg  650 mg Rectal Q6H PRN Oralia Manis, MD      . albuterol (PROVENTIL) (2.5 MG/3ML) 0.083% nebulizer solution 2.5 mg  2.5 mg Inhalation Q6H PRN Oralia Manis, MD      . atorvastatin (LIPITOR) tablet 40 mg  40 mg Oral Daily Oralia Manis, MD      . Chlorhexidine Gluconate Cloth 2 % PADS 6 each  6 each Topical Q0600 Oralia Manis, MD   6 each at 10/31/17 [phone]   . enoxaparin (LOVENOX) injection 40 mg  40 mg Subcutaneous Q24H Oralia Manis, MD      . fluticasone furoate-vilanterol (BREO ELLIPTA) 100-25 MCG/INH 1 puff  1 puff Inhalation Daily Oralia Manis, MD   1 puff at 10/31/17 0915  . insulin aspart (novoLOG) injection 0-9 Units  0-9 Units Subcutaneous Q6H Oralia Manis, MD      . levothyroxine (SYNTHROID, LEVOTHROID) tablet 75 mcg  75 mcg Oral QAC breakfast Oralia Manis, MD   75 mcg at 10/31/17 0913  . lisinopril (PRINIVIL,ZESTRIL) tablet 2.5 mg  2.5 mg Oral Daily Oralia Manis, MD      . metoprolol tartrate (LOPRESSOR) tablet 100 mg  100 mg Oral BID Oralia Manis, MD      . montelukast (SINGULAIR) tablet 10 mg  10 mg Oral Daily Oralia Manis, MD   10 mg at 10/31/17 0914  . mupirocin ointment (BACTROBAN) 2 % 1 application  1 application Nasal BID Oralia Manis, MD   1 application at 10/31/17 0914  . ondansetron (ZOFRAN) tablet 4 mg  4 mg Oral Q6H PRN Oralia Manis, MD       Or  . ondansetron [facility]) injection 4 mg  4 mg Intravenous Q6H PRN Oralia Manis, MD      . Oxcarbazepine (TRILEPTAL) tablet 600 mg  600 mg Oral BID Oralia Manis, MD   600 mg at 10/31/17 0915  . oxyCODONE (Oxy IR/ROXICODONE) immediate release tablet 5 mg  5 mg Oral Q4H PRN Oralia Manis, MD   5 mg at 10/31/17 0113  . pantoprazole (PROTONIX) EC tablet 40 mg  40 mg Oral Q1200 Oralia Manis, MD      . piperacillin-tazobactam (ZOSYN) IVPB 3.375 g  3.375 g Intravenous Willow Ora, MD 12.5 mL/hr at 10/31/17 0804 3.375 g at 10/31/17 0804  . tiotropium (SPIRIVA) inhalation capsule 18 mcg  18 mcg Inhalation Daily Oralia Manis, MD   18 mcg at 10/31/17 0914  . traZODone (DESYREL) tablet 100 mg  100 mg Oral QHS Oralia Manis, MD      . vancomycin (VANCOCIN) IVPB 1000 mg/200 mL premix  1,000 mg Intravenous Willow Ora, MD   Stopped at 10/31/17 0602  . ziprasidone (GEODON) capsule 40 mg  40 mg Oral BID WC Oralia Manis, MD   40 mg at 10/31/17 9604     Discharge  Medications: Please see discharge summary for a list of discharge medications.  Relevant Imaging Results:  Relevant Lab Results:   Additional Information (SSN: [id number])  May need IV ABX  Payton Spark, Student-Social Work

## 2017-10-31 NOTE — Care Management (Addendum)
Patient requesting extra wide rollator. Ordered from [facility].  Patient active with [facility] for SN, PT and OT, Barbara CowerJason with [facility] aware of admission.

## 2017-10-31 NOTE — Progress Notes (Signed)
Temp rechecked 102.5 Oral. MD Imogene Burnhen Paged and made aware. Received orders for new blood cultures and Ibuprofen 400 mg PRN/6 hrs. Orders placed.

## 2017-10-31 NOTE — Consult Note (Signed)
ORTHOPAEDIC CONSULTATION  REQUESTING PHYSICIAN: Milagros LollSudini, Srikar, MD  Chief Complaint: Worsening cellulitis left leg  HPI: Adrian DiegoCarlton B Royce Jr. is a 57 y.o. male who complains of worsening cellulitis left leg.  A wound care nurse has been following patient.  She noticed worsening cellulitis of the leg.  Patient sent to the emergency room and admitted.  Patient has been ambulating with an OrthoWedge shoe.  Past Medical History:  Diagnosis Date  . Asthma   . Bipolar disorder (HCC)   . Depression   . Diabetes mellitus without complication (HCC)   . Hyperlipemia   . Hypertension   . Hypothyroidism   . Sleep apnea, obstructive    Past Surgical History:  Procedure Laterality Date  . bone removed     bone removed in foot due to infection   . TONSILLECTOMY     Social History   Socioeconomic History  . Marital status: Single    Spouse name: None  . Number of children: None  . Years of education: None  . Highest education level: None  Social Needs  . Financial resource strain: None  . Food insecurity - worry: None  . Food insecurity - inability: None  . Transportation needs - medical: None  . Transportation needs - non-medical: None  Occupational History  . None  Tobacco Use  . Smoking status: Current Every Day Smoker  . Smokeless tobacco: Never Used  Substance and Sexual Activity  . Alcohol use: No  . Drug use: No  . Sexual activity: None  Other Topics Concern  . None  Social History Narrative  . None   Family History  Problem Relation Age of Onset  . Heart failure Mother   . Stroke Father    No Known Allergies Prior to Admission medications   Medication Sig Start Date End Date Taking? Authorizing Provider  acetaminophen (TYLENOL) 325 MG tablet Take 650 mg by mouth every 4 (four) hours as needed for mild pain or fever.    Yes [provider]  albuterol (PROVENTIL HFA;VENTOLIN HFA) 108 (90 Base) MCG/ACT inhaler Inhale 2 puffs into the lungs every 6 (six)  hours as needed for wheezing or shortness of breath.   Yes [provider]  alum & mag hydroxide-simeth (GERI-LANTA) 200-200-20 MG/5ML suspension Take 30 mLs by mouth as needed for indigestion or heartburn.    Yes [provider]  atorvastatin (LIPITOR) 40 MG tablet Take 40 mg by mouth daily.   Yes [provider]  azelastine (ASTELIN) 0.1 % nasal spray Place 1 spray into both nostrils 2 (two) times daily. Use in each nostril as directed   Yes [provider]  bismuth subsalicylate (PEPTO BISMOL) 262 MG/15ML suspension Take 30 mLs by mouth every 6 (six) hours as needed for indigestion.    Yes [provider]  cholecalciferol (VITAMIN D) 1000 units tablet Take 1,000 Units by mouth daily.   Yes [provider]  famotidine (PEPCID) 20 MG tablet Take 20 mg by mouth once.   Yes [provider]  ferrous sulfate 325 (65 FE) MG tablet Take 325 mg by mouth 2 (two) times daily with a meal.   Yes [provider]  fluticasone furoate-vilanterol (BREO ELLIPTA) 100-25 MCG/INH AEPB Inhale 1 puff into the lungs daily.   Yes [provider]  guaifenesin (ROBITUSSIN) 100 MG/5ML syrup Take 200 mg by mouth 3 (three) times daily as needed for cough.   Yes [provider]  levothyroxine (SYNTHROID, LEVOTHROID) 75 MCG tablet Take  75 mcg by mouth daily before breakfast.   Yes [provider]  lisinopril (PRINIVIL,ZESTRIL) 2.5 MG tablet Take 2.5 mg by mouth daily.   Yes [provider]  loperamide (IMODIUM A-D) 2 MG tablet Take 2 mg by mouth as needed for diarrhea or loose stools.   Yes [provider]  magnesium hydroxide (MILK OF MAGNESIA) 400 MG/5ML suspension Take by mouth daily as needed for mild constipation.   Yes [provider]  metoprolol (LOPRESSOR) 100 MG tablet Take 1 tablet (100 mg total) by mouth 2 (two) times daily. 10/20/15  Yes Drema Dallas, MD  montelukast (SINGULAIR) 10 MG tablet  Take 10 mg by mouth daily.   Yes [provider]  nystatin (MYCOSTATIN/NYSTOP) 100000 UNIT/GM POWD Apply 1 g topically 4 (four) times daily.    Yes [provider]  oxcarbazepine (TRILEPTAL) 600 MG tablet Take 600 mg by mouth 2 (two) times daily.   Yes [provider]  pantoprazole (PROTONIX) 40 MG tablet Take 1 tablet (40 mg total) by mouth daily at 12 noon. 10/20/15  Yes Drema Dallas, MD  senna (SENOKOT) 8.6 MG tablet Take 1 tablet by mouth at bedtime as needed for constipation.   Yes [provider]  Tiotropium Bromide Monohydrate (SPIRIVA RESPIMAT) 2.5 MCG/ACT AERS Inhale 1 spray into the lungs daily.   Yes [provider]  traZODone (DESYREL) 100 MG tablet Take 100 mg by mouth at bedtime.   Yes [provider]  ziprasidone (GEODON) 40 MG capsule Take 40 mg by mouth 2 (two) times daily with a meal.  09/07/15  Yes [provider]   Dg Foot Complete Left  Result Date: 10/30/2017 CLINICAL DATA:  Possible worsening infection in this patient with a wound involving the lateral left foot, currently being treated at the wound center. EXAM: LEFT FOOT - COMPLETE 3+ VIEW COMPARISON:  Left foot MRI 09/23/2017, 08/11/2014. Left foot x-rays 09/22/2017. FINDINGS: Gas is now present within the soft tissue wound involving the lateral aspect of the midfoot and forefoot. No convincing evidence of acute osteomyelitis involving the base of the fourth metatarsal near the gas-filled wound. Severe diffuse soft tissue swelling. Prior resection of most of the fifth metatarsal. Osseous demineralization. Narrowing of the tarsal-metatarsal joint spaces, the first through fifth MTP joint spaces, and narrowing of the IP joint spaces of the toes. IMPRESSION: 1. Gas within the previously described wound involving the lateral aspect of the left midfoot and forefoot. 2. No convincing evidence of osteomyelitis. No acute osseous abnormalities. Electronically Signed   By:  Hulan Saas M.D.   On: 10/30/2017 21:14    Positive ROS: All other systems have been reviewed and were otherwise negative with the exception of those mentioned in the HPI and as above.  12 point ROS was performed.  Physical Exam: General: Alert and oriented.  No apparent distress.  Vascular:  Left foot:Dorsalis Pedis:  diminished Posterior Tibial:  diminished secondary to edema excellent cap fill time to the digits  Right foot: Dorsalis Pedis:  present Posterior Tibial:  present  Neuro:absent protective sensation  Derm: Right foot without ulceration Left foot plantar lateral ulcer under the previous fifth metatarsal region is full-thickness the epidermis and dermal layer with a scant amount of fibrotic tissue.  Attempted probing of the wound to any deep structures was not obtained.  This is limited to subcutaneous tissue.  There was a small lymphangitic streak into the medial arch which was subtle in nature.  Patient definitely  has cellulitis to the left leg with diffuse edema.  Ortho/MS: [place] edema to the left lower extremity.  No pain secondary to neuropathy.  I reviewed the x-rays that were concerning for gas.  This is consistent with the ulcerative site.  There is no gas or crepitance with palpation to the area.  Assessment: Cellulitis left lower extremity Diabetes with neuropathy Diabetic foot ulceration to subcutaneous tissue  Plan: Attempted probing of the wound did not reveal any deep ulcerative sites.  I did perform a wound culture to further evaluate the cellulitic area as there was a small amount of lymphangitis from the area.  X-ray is negative for osteomyelitis.  Area of concern for gas is consistent with the ulcerative site.  No suspicious gas throughout the remainder of the x-ray.  At this point would continue with antibiotics following wound culture.  Will hold on debridement for now but if the area does have purulent drainage or probes deep in the future will  consider debridement formally in the OR.  Will write for wound care dressings to be performed.  Will follow on Thursday if still in-house.    Irean Hong, DPM Cell [phone]   10/31/2017 8:20 AM

## 2017-10-31 NOTE — Progress Notes (Signed)
[facility] - [place] at [facility]   PATIENT NAME: Adrian [name]    MR#:  [id number]  DATE OF BIRTH:  [birth year]  SUBJECTIVE:  CHIEF COMPLAINT:   Chief Complaint  Patient presents with  . Wound Infection   Pain with left foot well controlled.  Blood in urine.  No dysuria or abdominal pain.  Afebrile.  REVIEW OF SYSTEMS:    Review of Systems  Constitutional: Positive for malaise/fatigue. Negative for chills and fever.  HENT: Negative for sore throat.   Eyes: Negative for blurred vision, double vision and pain.  Respiratory: Negative for cough, hemoptysis, shortness of breath and wheezing.   Cardiovascular: Negative for chest pain, palpitations, orthopnea and leg swelling.  Gastrointestinal: Negative for abdominal pain, constipation, diarrhea, heartburn, nausea and vomiting.  Genitourinary: Positive for hematuria. Negative for dysuria.  Musculoskeletal: Positive for joint pain. Negative for back pain.  Skin: Negative for rash.  Neurological: Positive for weakness. Negative for sensory change, speech change, focal weakness and headaches.  Endo/Heme/Allergies: Does not bruise/bleed easily.  Psychiatric/Behavioral: Negative for depression. The patient is not nervous/anxious.     DRUG ALLERGIES:  No Known Allergies  VITALS:  Blood pressure (!) 104/53, pulse 63, temperature 98.6 F (37 C), temperature source Oral, resp. rate 20, height 5\' 11"  (1.803 m), weight 120.7 kg (266 lb), SpO2 98 %.  PHYSICAL EXAMINATION:   Physical Exam  GENERAL:  57 y.o.-year-old patient lying in the bed , orbitally obese EYES: Pupils equal, round, reactive to light and accommodation. No scleral icterus. Extraocular muscles intact.  HEENT: Head atraumatic, normocephalic. Oropharynx and nasopharynx clear.  NECK:  Supple, no jugular venous distention. No thyroid enlargement, no tenderness.  LUNGS: Normal breath sounds bilaterally, no wheezing, rales, rhonchi. No use of accessory  muscles of respiration.  CARDIOVASCULAR: S1, S2 normal. No murmurs, rubs, or gallops.  ABDOMEN: Soft, nontender, nondistended. Bowel sounds present. No organomegaly or mass.  EXTREMITIES: Left heel dressing with redness tracking up to mid leg NEUROLOGIC: Cranial nerves II through XII are intact. No focal Motor or sensory deficits b/l.   PSYCHIATRIC: The patient is alert and awake SKIN: No obvious rash, lesion, or ulcer.   LABORATORY PANEL:   CBC Recent Labs  Lab 10/31/17 0314  WBC 7.0  HGB 10.0*  HCT 29.4*  PLT 160   ------------------------------------------------------------------------------------------------------------------ Chemistries  Recent Labs  Lab 10/30/17 2038 10/31/17 0314  NA 136 140  K 3.4* 3.4*  CL 105 106  CO2 22 24  GLUCOSE 129* 103*  BUN 19 15  CREATININE 0.99 1.04  CALCIUM 8.2* 8.2*  AST 22  --   ALT 18  --   ALKPHOS 120  --   BILITOT 0.5  --    ------------------------------------------------------------------------------------------------------------------  Cardiac Enzymes No results for input(s): TROPONINI in the last 168 hours. ------------------------------------------------------------------------------------------------------------------  RADIOLOGY:  Dg Foot Complete Left  Result Date: 10/30/2017 CLINICAL DATA:  Possible worsening infection in this patient with a wound involving the lateral left foot, currently being treated at the wound center. EXAM: LEFT FOOT - COMPLETE 3+ VIEW COMPARISON:  Left foot MRI 09/23/2017, 08/11/2014. Left foot x-rays 09/22/2017. FINDINGS: Gas is now present within the soft tissue wound involving the lateral aspect of the midfoot and forefoot. No convincing evidence of acute osteomyelitis involving the base of the fourth metatarsal near the gas-filled wound. Severe diffuse soft tissue swelling. Prior resection of most of the fifth metatarsal. Osseous demineralization. Narrowing of the tarsal-metatarsal joint  spaces, the first through fifth MTP joint  spaces, and narrowing of the IP joint spaces of the toes. IMPRESSION: 1. Gas within the previously described wound involving the lateral aspect of the left midfoot and forefoot. 2. No convincing evidence of osteomyelitis. No acute osseous abnormalities. Electronically Signed   By: Hulan Saashomas  Lawrence M.D.   On: 10/30/2017 21:14     ASSESSMENT AND PLAN:    * Diabetic foot ulcer with cellulitis No osteomyelitis on x-ray Continue IV antibiotics.  Appreciate podiatry input.  No need for debridement. Wound culture sent and pending.  *Hematuria.  Etiology unclear.  Improving.  Patient had similar problems in the past.  Will check urinalysis.  On antibiotics which would cover UTI.  No dysuria or abdominal pain. Will need urology follow-up as outpatient  *  Asthma, chronic -continue home dose inhalers   * Essential hypertension, benign -continue home meds   * Diabetes (HCC) -findings scale insulin with corresponding glucose checks   * OSA (obstructive sleep apnea) -CPAP nightly  All the records are reviewed and case discussed with Care Management/Social Worker Management plans discussed with the patient, family and they are in agreement.  CODE STATUS: FULL CODE  DVT Prophylaxis: SCDs  TOTAL TIME TAKING CARE OF THIS PATIENT: 35 minutes.   POSSIBLE D/C IN 2-3 DAYS, DEPENDING ON CLINICAL CONDITION.  Orie FishermanSrikar R Ashara Lounsbury M.D on 10/31/2017 at 2:43 PM  Between 7am to 6pm - Pager - [phone]  After 6pm go to [email or web address] - password [email or web address]  [facility] [place] [facility]  Office  [phone]  CC: Primary care physician; Center, [facility]  Note: This dictation was prepared with Dragon dictation along with smaller phrase technology. Any transcriptional errors that result from this process are unintentional.

## 2017-11-01 LAB — GLUCOSE, CAPILLARY
GLUCOSE-CAPILLARY: 99 mg/dL (ref 65–99)
Glucose-Capillary: 85 mg/dL (ref 65–99)
Glucose-Capillary: 82 mg/dL (ref 65–99)
Glucose-Capillary: 84 mg/dL (ref 65–99)

## 2017-11-01 LAB — VANCOMYCIN, TROUGH: VANCOMYCIN TR: 14 ug/mL — AB (ref 15–20)

## 2017-11-01 LAB — HEMOGLOBIN: HEMOGLOBIN: 10.8 g/dL — AB (ref 13.0–18.0)

## 2017-11-01 MED ORDER — VANCOMYCIN HCL IN DEXTROSE 1-5 GM/200ML-% IV SOLN
1000.0000 mg | Freq: Three times a day (TID) | INTRAVENOUS | Status: DC
  Administered 2017-11-01 – 2017-11-02 (×3): 1000 mg via INTRAVENOUS
  Filled 2017-11-01 (×6): qty 200

## 2017-11-01 NOTE — Progress Notes (Signed)
Pharmacy Antibiotic Note  Lieutenant DiegoCarlton B Ramnath Jr. is a 57 y.o. male admitted on 10/30/2017 with diabetic foot infection.  Pharmacy has been consulted for vanc/zosyn dosing.  Plan: Patient received vanc 1g and zosyn 3.375g IV x 1 in ED  Will f/u w/ vanc 1g IV q8h w/ 6 hour stack dose Will draw vanc trough 1/16 @ 0400 prior to 4th dose Will continue zosyn 3.375g IV q8h  Ke 0.0914 T1/2 8 hrs Goal trough 15 - 20 mcg/mL  01/16 @ 0630 VT 14 drawn 2.5 hours too late true trough around 17.6 mcg/mL. Will continue current dose and will redraw VT @ 0117 0700  Height: 5\' 11"  (180.3 cm) Weight: 266 lb (120.7 kg) IBW/kg (Calculated) : 75.3  Temp (24hrs), Avg:100.1 F (37.8 C), Min:98.1 F (36.7 C), Max:102.5 F (39.2 C)  Recent Labs  Lab 10/30/17 2038 10/30/17 2254 10/31/17 0314 11/01/17 0630  WBC 8.1  --  7.0  --   CREATININE 0.99  --  1.04  --   LATICACIDVEN  --  1.4  --   --   VANCOTROUGH  --   --   --  14*    Estimated Creatinine Clearance: 104.9 mL/min (by C-G formula based on SCr of 1.04 mg/dL).    No Known Allergies  Thank you for allowing pharmacy to be a part of this patient's care.  Thomasene Rippleavid Krysia Zahradnik, PharmD, BCPS Clinical Pharmacist 11/01/2017

## 2017-11-01 NOTE — Progress Notes (Signed)
[facility] - [place] at [facility]   PATIENT NAME: Adrian [name]    MR#:  [id number]  DATE OF BIRTH:  Sep 30, 1961  SUBJECTIVE:  CHIEF COMPLAINT:   Chief Complaint  Patient presents with  . Wound Infection   -Still has significant swelling and redness of left lower extremity. Remains on IV antibiotics -Fever of 102F last evening  REVIEW OF SYSTEMS:  Review of Systems  Constitutional: Positive for fever. Negative for chills.  HENT: Negative for ear discharge, ear pain, hearing loss and nosebleeds.   Eyes: Negative for blurred vision and double vision.  Respiratory: Negative for cough, shortness of breath and wheezing.   Cardiovascular: Positive for leg swelling. Negative for chest pain and palpitations.  Gastrointestinal: Negative for abdominal pain, constipation, diarrhea, nausea and vomiting.  Genitourinary: Negative for dysuria and urgency.  Musculoskeletal: Negative for myalgias.  Skin: Positive for rash.  Neurological: Negative for dizziness, speech change, focal weakness, seizures and headaches.  Psychiatric/Behavioral: Negative for depression.    DRUG ALLERGIES:  No Known Allergies  VITALS:  Blood pressure 112/62, pulse (!) 58, temperature 97.8 F (36.6 C), temperature source Oral, resp. rate 20, height 5\' 11"  (1.803 m), weight 120.7 kg (266 lb), SpO2 100 %.  PHYSICAL EXAMINATION:  Physical Exam  GENERAL:  57 y.o.-year-old patient lying in the bed with no acute distress.  EYES: Pupils equal, round, reactive to light and accommodation. No scleral icterus. Extraocular muscles intact.  HEENT: Head atraumatic, normocephalic. Oropharynx and nasopharynx clear.  NECK:  Supple, no jugular venous distention. No thyroid enlargement, no tenderness.  LUNGS: Normal breath sounds bilaterally, no wheezing, rales,rhonchi or crepitation. No use of accessory muscles of respiration.  CARDIOVASCULAR: S1, S2 normal. No murmurs, rubs, or gallops.  ABDOMEN: Soft,  nontender, nondistended. Bowel sounds present. No organomegaly or mass.  EXTREMITIES: Left lower extremity swelling and erythema and warmth present from the foot to mid leg, dressing in place for the left foot for ulcer on the plantar surface. NEUROLOGIC: Cranial nerves II through XII are intact. Muscle strength 5/5 in all extremities. Sensation intact. Gait not checked.  PSYCHIATRIC: The patient is alert and oriented x 3. Masked affect SKIN: No obvious rash, lesion, or ulcer.    LABORATORY PANEL:   CBC Recent Labs  Lab 10/31/17 0314 11/01/17 0630  WBC 7.0  --   HGB 10.0* 10.8*  HCT 29.4*  --   PLT 160  --    ------------------------------------------------------------------------------------------------------------------  Chemistries  Recent Labs  Lab 10/30/17 2038 10/31/17 0314  NA 136 140  K 3.4* 3.4*  CL 105 106  CO2 22 24  GLUCOSE 129* 103*  BUN 19 15  CREATININE 0.99 1.04  CALCIUM 8.2* 8.2*  AST 22  --   ALT 18  --   ALKPHOS 120  --   BILITOT 0.5  --    ------------------------------------------------------------------------------------------------------------------  Cardiac Enzymes No results for input(s): TROPONINI in the last 168 hours. ------------------------------------------------------------------------------------------------------------------  RADIOLOGY:  Dg Foot Complete Left  Result Date: 10/30/2017 CLINICAL DATA:  Possible worsening infection in this patient with a wound involving the lateral left foot, currently being treated at the wound center. EXAM: LEFT FOOT - COMPLETE 3+ VIEW COMPARISON:  Left foot MRI 09/23/2017, 08/11/2014. Left foot x-rays 09/22/2017. FINDINGS: Gas is now present within the soft tissue wound involving the lateral aspect of the midfoot and forefoot. No convincing evidence of acute osteomyelitis involving the base of the fourth metatarsal near the gas-filled wound. Severe diffuse soft tissue swelling. Prior  resection of most  of the fifth metatarsal. Osseous demineralization. Narrowing of the tarsal-metatarsal joint spaces, the first through fifth MTP joint spaces, and narrowing of the IP joint spaces of the toes. IMPRESSION: 1. Gas within the previously described wound involving the lateral aspect of the left midfoot and forefoot. 2. No convincing evidence of osteomyelitis. No acute osseous abnormalities. Electronically Signed   By: Hulan Saashomas  Lawrence M.D.   On: 10/30/2017 21:14    EKG:   Orders placed or performed during the hospital encounter of 10/04/15  . EKG 12-Lead  . EKG 12-Lead  . EKG 12-Lead  . EKG 12-Lead  . EKG 12-Lead  . EKG 12-Lead  . EKG 12-Lead  . EKG 12-Lead  . EKG 12-Lead  . EKG 12-Lead  . EKG 12-Lead  . EKG 12-Lead    ASSESSMENT AND PLAN:   57 year old male with past medical history significant for depression, bipolar disorder, asthma, hypertension, diabetes presents to hospital secondary to worsening swelling and also rolled his left foot.  1. Left foot cellulitis and ulcer on the plantar surface-no history of allergies on x-ray -Appreciate podiatry input. Since it's not deep wound, no need for debridement at this time. -Podiatry to recheck wound tomorrow. -Until then continue IV vancomycin and Zosyn. - Wound cultures are pending at this time  2. Hematuria- outpatient urology follow-up recommended. No hematuria today.  3. Bipolar with depression and anxiety-continue home medications. Patient on Trileptal, trazodone, Geodon  4. Hypertension- lisinopril and Metoprolol  5. DVT prophylaxis-Lovenox  Physical therapy consult. Weightbearing per podiatry. Patient has a OrthoWedge shoe. -Uses walker at baseline   All the records are reviewed and case discussed with Care Management/Social Workerr. Management plans discussed with the patient, family and they are in agreement.  CODE STATUS: Full Code  TOTAL TIME TAKING CARE OF THIS PATIENT: 38 minutes.   POSSIBLE D/C IN 2 DAYS,  DEPENDING ON CLINICAL CONDITION.   Enid BaasKALISETTI,Cyrus Ramsburg M.D on 11/01/2017 at 1:54 PM  Between 7am to 6pm - Pager - [phone]  After 6pm go to [email or web address] - Social research officer, governmentpassword [email or web address]  [facility] [place] [facility]  Office  [phone]  CC: Primary care physician; Center, [facility]

## 2017-11-02 LAB — BASIC METABOLIC PANEL
Anion gap: 9 (ref 5–15)
BUN: 10 mg/dL (ref 6–20)
CHLORIDE: 103 mmol/L (ref 101–111)
CO2: 27 mmol/L (ref 22–32)
Calcium: 8.9 mg/dL (ref 8.9–10.3)
Creatinine, Ser: 0.88 mg/dL (ref 0.61–1.24)
GFR calc Af Amer: >60 mL/min (ref >60)
GFR calc non Af Amer: >60 mL/min (ref >60)
GLUCOSE: 99 mg/dL (ref 65–99)
POTASSIUM: 4.7 mmol/L (ref 3.5–5.1)
Sodium: 139 mmol/L (ref 135–145)

## 2017-11-02 LAB — GLUCOSE, CAPILLARY
GLUCOSE-CAPILLARY: 84 mg/dL (ref 65–99)
GLUCOSE-CAPILLARY: 69 mg/dL (ref 65–99)
Glucose-Capillary: 75 mg/dL (ref 65–99)

## 2017-11-02 LAB — VANCOMYCIN, TROUGH: VANCOMYCIN TR: 22 ug/mL — AB (ref 15–20)

## 2017-11-02 MED ORDER — VANCOMYCIN HCL 10 G IV SOLR
1500.0000 mg | Freq: Two times a day (BID) | INTRAVENOUS | Status: DC
  Filled 2017-11-02 (×2): qty 1500

## 2017-11-02 MED ORDER — SULFAMETHOXAZOLE-TRIMETHOPRIM 400-80 MG PO TABS
1.0000 | ORAL_TABLET | Freq: Two times a day (BID) | ORAL | Status: DC
  Filled 2017-11-02 (×2): qty 1

## 2017-11-02 MED ORDER — SULFAMETHOXAZOLE-TRIMETHOPRIM 800-160 MG PO TABS
1.0000 | ORAL_TABLET | Freq: Two times a day (BID) | ORAL | Status: DC
  Administered 2017-11-02 – 2017-11-03 (×3): 1 via ORAL
  Filled 2017-11-02 (×2): qty 1

## 2017-11-02 MED ORDER — VANCOMYCIN HCL 10 G IV SOLR
1250.0000 mg | Freq: Two times a day (BID) | INTRAVENOUS | Status: DC
  Filled 2017-11-02 (×2): qty 1250

## 2017-11-02 NOTE — Progress Notes (Addendum)
Pharmacy Antibiotic Note  Adrian DiegoCarlton B Varano Jr. is a 57 y.o. male admitted on 10/30/2017 with diabetic foot infection.  Pharmacy has been consulted for vanc/zosyn dosing.  Plan: Patient is  receiving  vanc 1g IV q8h. Vanc trough resulted at 22 this morning (1/17 @ 0701) Last dose was given 1 hour late and trough was drawn 30 min early. Expect actual trough to be right at 20.   New Kinetics:  Ke 0.064   T1/2: 10.8  Vd: 84 Goal trough 15 - 20 mcg/mL  Will adjust vancomycin regimen to 1250mg  IV every 12 hours. Calculated trough at Css is 15. Trough level ordered prior to 4th dose.   Continue Zosyn 3.375 IV EI every 8 hours.   Height: 5\' 11"  (180.3 cm) Weight: 266 lb (120.7 kg) IBW/kg (Calculated) : 75.3  Temp (24hrs), Avg:98.7 F (37.1 C), Min:98.3 F (36.8 C), Max:99 F (37.2 C)  Recent Labs  Lab 10/30/17 2038 10/30/17 2254 10/31/17 0314 11/01/17 0630 11/02/17 0701  WBC 8.1  --  7.0  --   --   CREATININE 0.99  --  1.04  --  0.88  LATICACIDVEN  --  1.4  --   --   --   VANCOTROUGH  --   --   --  14* 22*    Estimated Creatinine Clearance: 124 mL/min (by C-G formula based on SCr of 0.88 mg/dL).    No Known Allergies  Thank you for allowing pharmacy to be a part of this patient's care.  Gardner CandleSheema M Voris Tigert, PharmD, BCPS Clinical Pharmacist 11/02/2017 8:09 AM

## 2017-11-02 NOTE — Progress Notes (Signed)
Daily Progress Note   Subjective  - * No surgery found *  F/u left foot ulcer  Objective Vitals:   11/01/17 0748 11/01/17 1636 11/01/17 2009 11/02/17 0552  BP: 112/62 117/69 115/62 (!) 135/57  Pulse: (!) 58 64 65 (!) 54  Resp: 20 18 16    Temp: 97.8 F (36.6 C) 98.3 F (36.8 C) 99 F (37.2 C) 98.7 F (37.1 C)  TempSrc: Oral Oral Oral Oral  SpO2: 100% 100% 98% 100%  Weight:      Height:        Physical Exam: Ulcer is improved with just mixed mild fibrotic and mostly granular tissue.  No purulence.  Lymphangitic streak is reduced.  Cellulitis to lower extremity is improved as well.  Not as intense.  Culture with GPC and GNR.  This was [name] superficial wound culture and did not probe deep. Laboratory CBC    Component Value Date/Time   WBC 7.0 10/31/2017 0314   HGB 10.8 (L) 11/01/2017 0630   HGB 9.6 (L) 04/26/2014 0309   HCT 29.4 (L) 10/31/2017 0314   HCT 29.0 (L) 04/26/2014 0309   PLT 160 10/31/2017 0314   PLT 331 04/26/2014 0309    BMET    Component Value Date/Time   NA 139 11/02/2017 0701   NA 143 04/26/2014 0309   K 4.7 11/02/2017 0701   K 4.1 04/26/2014 0309   CL 103 11/02/2017 0701   CL 106 04/26/2014 0309   CO2 27 11/02/2017 0701   CO2 27 04/26/2014 0309   GLUCOSE 99 11/02/2017 0701   GLUCOSE 100 (H) 04/26/2014 0309   BUN 10 11/02/2017 0701   BUN 11 04/26/2014 0309   CREATININE 0.88 11/02/2017 0701   CREATININE 1.08 04/26/2014 0309   CALCIUM 8.9 11/02/2017 0701   CALCIUM 8.5 04/26/2014 0309   GFRNONAA >60 11/02/2017 0701   GFRNONAA >60 04/26/2014 0309   GFRAA >60 11/02/2017 0701   GFRAA >60 04/26/2014 0309    Assessment/Planning: Cellulitis left foot Diabetic foot ulcer   C/W wound care.  Pt would benefit from PT with NWB to left lower leg.  Have pt f/u outpt in 2-3 weeks.    Adrian RevelsFowler, Adrian [name]  11/02/2017, 8:21 AM

## 2017-11-02 NOTE — Clinical Social Work Placement (Signed)
   CLINICAL SOCIAL WORK PLACEMENT  NOTE  Date:  11/02/2017  Patient Details  Name: Adrian DiegoCarlton B Mcfarlane Jr. MRN: [id number] Date of Birth: Jan 03, 1961  Clinical Social Work is seeking post-discharge placement for this patient at the Skilled  Nursing Facility level of care (*CSW will initial, date and re-position this form in  chart as items are completed):  Yes   Patient/family provided with [facility] Clinical Social Work Department's list of facilities offering this level of care within the geographic area requested by the patient (or if unable, by the patient's family).  Yes   Patient/family informed of their freedom to choose among providers that offer the needed level of care, that participate in Medicare, Medicaid or managed care program needed by the patient, have an available bed and are willing to accept the patient.  Yes   Patient/family informed of [facility]'s ownership interest in [facility] and [facility], as well as of the fact that they are under no obligation to receive care at these facilities.  PASRR submitted to EDS on 11/02/17     PASRR number received on 11/02/17     Existing PASRR number confirmed on       FL2 transmitted to all facilities in geographic area requested by pt/family on 11/02/17     FL2 transmitted to all facilities within larger geographic area on       Patient informed that his/her managed care company has contracts with or will negotiate with certain facilities, including the following:        Yes   Patient/family informed of bed offers received.  Patient chooses bed at ([facility] )     Physician recommends and patient chooses bed at      Patient to be transferred to   on  .  Patient to be transferred to facility by       Patient family notified on   of transfer.  Name of family member notified:        PHYSICIAN       Additional Comment:    _______________________________________________ Satcha Storlie, Darleen CrockerBailey M, LCSW 11/02/2017,  4:55 PM

## 2017-11-02 NOTE — Evaluation (Signed)
Physical Therapy Evaluation Patient Details Name: Adrian [name]. MRN: [id number] DOB: Mar 18, 1961 Today's Date: 11/02/2017   History of Present Illness  Pt is a56 y.o.malewho presents with left diabetic foot ulcer. Patient has been following with podiatry for a brief period of time for this ulcer. He was seen by home health nursing who felt like he had some worsening of the wound and tracking cellulitis up his leg. He came to the hospital for evaluation. Here he was found to have cellulitis of his left lower extremity and on imaging some new gas formation around his foot ulcer. He was placed on IV antibiotics and hospitalist were called for admission.  Assessment includes: Left foot cellulitis and ulcer on the plantar surface, hematuria, UTI, bipolar with depression and anxiety, and HTN.     Clinical Impression  Pt presents with deficits in strength, transfers, mobility, gait, balance, and activity tolerance.  Pt mod Ind with bed mobility tasks with use of rails and only slight increase in time/effort.  Pt able to stand from elevated EOB with Min A and RW with cues for sequencing to maintain LLE NWB status which pt was able to do throughout session.  Once in standing pt was able to pivot nearly 90 deg to the R and then pivot back to EOB but then required to return to sitting secondary to fatigue.  Pt was unable to attempt a hop-to step at EOB this session.  Pt will benefit from PT services in a SNF setting upon discharge to safely address above deficits for decreased caregiver assistance and eventual return to PLOF.     Follow Up Recommendations SNF    Equipment Recommendations  Other (comment)(TBD at next venue of care.  If pt unable to discharge to SNF will require an appropriate sized drop arm w/c and a BSC)    Recommendations for Other Services       Precautions / Restrictions Precautions Precautions: Fall Restrictions Weight Bearing Restrictions: Yes LLE Weight Bearing: Non  weight bearing      Mobility  Bed Mobility Overal bed mobility: Modified Independent Bed Mobility: Supine to Sit;Sit to Supine     Supine to sit: Modified independent (Device/Increase time) Sit to supine: Modified independent (Device/Increase time)   General bed mobility comments: Use of rails with min extra time and effort for tasks  Transfers Overall transfer level: Needs assistance Equipment used: Rolling walker (2 wheeled) Transfers: Sit to/from [employer] Sit to Stand: From elevated surface;Min assist Stand pivot transfers: Min assist;From elevated surface       General transfer comment: Verbal cues for sequencing with pt able to remain compliant with LLE NWB status throughout session.   After sit to stand pt able to pivot with RW close to 90 deg to the R and then back with min instability.  Ambulation/Gait             General Gait Details: Pt unable to perform hop-to step  Stairs            Wheelchair Mobility    Modified Rankin (Stroke Patients Only)       Balance Overall balance assessment: Needs assistance   Sitting balance-Leahy Scale: Good     Standing balance support: Bilateral upper extremity supported Standing balance-Leahy Scale: Poor Standing balance comment: Pt required min A for stability in standing on RLE with heavy reliance on RW  Pertinent Vitals/Pain Pain Assessment: No/denies pain    Home Living Family/patient expects to be discharged to:: Group home(Group home has ramp access but too narrow for pt's w/c)                      Prior Function Level of Independence: Needs assistance   Gait / Transfers Assistance Needed: Mod Ind with amb with a RW limited to [facility] distances, no fall history; Ind with bed mobility and transfers  ADL's / Homemaking Assistance Needed: Pt received assistance from group home staff for bathing, meals, and meds but was Ind with dressing.          Hand Dominance   Dominant Hand: Right    Extremity/Trunk Assessment   Upper Extremity Assessment Upper Extremity Assessment: Overall WFL for tasks assessed    Lower Extremity Assessment Lower Extremity Assessment: Generalized weakness       Communication   Communication: No difficulties  Cognition Arousal/Alertness: Awake/alert Behavior During Therapy: WFL for tasks assessed/performed Overall Cognitive Status: Within Functional Limits for tasks assessed                                        General Comments      Exercises Total Joint Exercises Ankle Circles/Pumps: AROM;Both;10 reps Quad Sets: Strengthening;Both;10 reps Long Arc Quad: AROM;Strengthening;Both;10 reps Knee Flexion: AROM;Strengthening;Both;10 reps   Assessment/Plan    PT Assessment Patient needs continued PT services  PT Problem List Decreased strength;Decreased activity tolerance;Decreased balance;Decreased knowledge of use of DME;Decreased mobility       PT Treatment Interventions DME instruction;Gait training;Functional mobility training;Neuromuscular re-education;Balance training;Therapeutic exercise;Therapeutic activities;Patient/family education    PT Goals (Current goals can be found in the Care Plan section)  Acute Rehab PT Goals Patient Stated Goal: To walk better PT Goal Formulation: With patient Time For Goal Achievement: 11/15/17 Potential to Achieve Goals: Good    Frequency Min 2X/week   Barriers to discharge Inaccessible home environment      Co-evaluation               AM-PAC PT "6 Clicks" Daily Activity  Outcome Measure Difficulty turning over in bed (including adjusting bedclothes, sheets and blankets)?: A Little Difficulty moving from lying on back to sitting on the side of the bed? : A Little Difficulty sitting down on and standing up from a chair with arms (e.g., wheelchair, bedside commode, etc,.)?: Unable Help needed moving to and from  a bed to chair (including a wheelchair)?: A Little Help needed walking in hospital room?: Total Help needed climbing 3-5 steps with a railing? : Total 6 Click Score: 12    End of Session Equipment Utilized During Treatment: Gait belt Activity Tolerance: Patient tolerated treatment well Patient left: in bed;with call bell/phone within reach;with bed alarm set Nurse Communication: Mobility status PT Visit Diagnosis: Difficulty in walking, not elsewhere classified (R26.2);Muscle weakness (generalized) (M62.81)    Time: 1415(Also 9:02-9:15 obtaining history with nursing requesting PT to return later after dressing change to L foot)-1440 PT Time Calculation (min) (ACUTE ONLY): 25 min   Charges:   PT Evaluation $PT Eval Low Complexity: 1 Low PT Treatments $Therapeutic Exercise: 8-22 mins   PT G Codes:        Elly Modena. Scott Huriel Matt PT, DPT 11/02/17, 5:12 PM

## 2017-11-02 NOTE — Progress Notes (Signed)
PT is recommending SNF. Clinical Social Worker (CSW) met with patient and made him aware of above. Patient is agreeable to SNF search in [facility]. CSW presented bed offers to patient and he chose [facility]. Patient's HPOA [name] is aware of above and will bring patient's c-pap machine to [facility]. [facility] liaison is aware of above.   [employer], LCSW [phone]

## 2017-11-02 NOTE — Care Management (Addendum)
Notified Jason with [facility] of admission. Patient has RN, PT and OT.

## 2017-11-02 NOTE — Progress Notes (Signed)
[facility]   PATIENT NAME: Adrian [name]    MR#:  [id number]  DATE OF BIRTH:  07-04-1961  SUBJECTIVE:  CHIEF COMPLAINT:   Chief Complaint  Patient presents with  . Wound Infection   -Remains afebrile. Doing well, improved redness and swelling of his left foot  REVIEW OF SYSTEMS:  Review of Systems  Constitutional: Negative for chills and fever.  HENT: Negative for ear discharge, ear pain, hearing loss and nosebleeds.   Eyes: Negative for blurred vision and double vision.  Respiratory: Negative for cough, shortness of breath and wheezing.   Cardiovascular: Positive for leg swelling. Negative for chest pain and palpitations.  Gastrointestinal: Negative for abdominal pain, constipation, diarrhea, nausea and vomiting.  Genitourinary: Negative for dysuria and urgency.  Musculoskeletal: Negative for myalgias.  Skin: Negative for rash.  Neurological: Negative for dizziness, speech change, focal weakness, seizures and headaches.  Psychiatric/Behavioral: Negative for depression.    DRUG ALLERGIES:  No Known Allergies  VITALS:  Blood pressure (!) 135/57, pulse (!) 54, temperature 98.7 F (37.1 C), temperature source Oral, resp. rate 16, height 5\' 11"  (1.803 m), weight 120.7 kg (266 lb), SpO2 100 %.  PHYSICAL EXAMINATION:  Physical Exam  GENERAL:  57 y.o.-year-old patient lying in the bed with no acute distress.  EYES: Pupils equal, round, reactive to light and accommodation. No scleral icterus. Extraocular muscles intact.  HEENT: Head atraumatic, normocephalic. Oropharynx and nasopharynx clear.  NECK:  Supple, no jugular venous distention. No thyroid enlargement, no tenderness.  LUNGS: Normal breath sounds bilaterally, no wheezing, rales,rhonchi or crepitation. No use of accessory muscles of respiration.  CARDIOVASCULAR: S1, S2 normal. No murmurs, rubs, or gallops.  ABDOMEN: Soft, nontender, nondistended. Bowel sounds present. No  organomegaly or mass.  EXTREMITIES: Left lower extremity swelling and erythema and warmth present from the foot to mid leg, dressing in place for the left foot for ulcer on the plantar surface. NEUROLOGIC: Cranial nerves II through XII are intact. Muscle strength 5/5 in all extremities. Sensation intact. Gait not checked.  PSYCHIATRIC: The patient is alert and oriented x 3. Masked affect SKIN: No obvious rash, lesion, or ulcer.    LABORATORY PANEL:   CBC Recent Labs  Lab 10/31/17 0314 11/01/17 0630  WBC 7.0  --   HGB 10.0* 10.8*  HCT 29.4*  --   PLT 160  --    ------------------------------------------------------------------------------------------------------------------  Chemistries  Recent Labs  Lab 10/30/17 2038  11/02/17 0701  NA 136   < > 139  K 3.4*   < > 4.7  CL 105   < > 103  CO2 22   < > 27  GLUCOSE 129*   < > 99  BUN 19   < > 10  CREATININE 0.99   < > 0.88  CALCIUM 8.2*   < > 8.9  AST 22  --   --   ALT 18  --   --   ALKPHOS 120  --   --   BILITOT 0.5  --   --    < > = values in this interval not displayed.   ------------------------------------------------------------------------------------------------------------------  Cardiac Enzymes No results for input(s): TROPONINI in the last 168 hours. ------------------------------------------------------------------------------------------------------------------  RADIOLOGY:  No results found.  EKG:   Orders placed or performed during the hospital encounter of 10/04/15  . EKG 12-Lead  . EKG 12-Lead  . EKG 12-Lead  . EKG 12-Lead  . EKG 12-Lead  . EKG 12-Lead  .  EKG 12-Lead  . EKG 12-Lead  . EKG 12-Lead  . EKG 12-Lead  . EKG 12-Lead  . EKG 12-Lead    ASSESSMENT AND PLAN:   57 year old male with past medical history significant for depression, bipolar disorder, asthma, hypertension, diabetes presents to hospital secondary to worsening swelling and also rolled his left foot.  1. Left foot  cellulitis and ulcer on the plantar surface-no osteomyelitis on x-ray -Appreciate podiatry input. Since it's not deep wound, no need for debridement at this time. -Received vancomycin and Zosyn in the hospital. Wound cultures growing gram-positive cocci in chains and clusters and a few gram-negative rods. -Changed to Bactrim. -Nonweightbearing of the left leg per podiatry  2. Hematuria- outpatient urology follow-up recommended. No hematuria now. - also has UTI, no cultures were sent, continue antibiotics  3. Bipolar with depression and anxiety-continue home medications. Patient on Trileptal, trazodone, Geodon  4. Hypertension- lisinopril and Metoprolol  5. DVT prophylaxis-Lovenox  Physical therapy consult. Non Weightbearing of left leg per podiatry.  -Uses walker at baseline -Will need rehabilitation at discharge   All the records are reviewed and case discussed with Care Management/Social Workerr. Management plans discussed with the patient, family and they are in agreement.  CODE STATUS: Full Code  TOTAL TIME TAKING CARE OF THIS PATIENT: 36 minutes.   POSSIBLE D/C IN 1-2 DAYS, DEPENDING ON CLINICAL CONDITION.   Enid BaasKALISETTI,Ferman Basilio M.D on 11/02/2017 at 12:58 PM  Between 7am to 6pm - Pager - [phone]  After 6pm go to [email or web address] - Social research officer, governmentpassword EPAS [facility]  [facility]  Office  [phone]  CC: Primary care physician; [facility]

## 2017-11-02 NOTE — Progress Notes (Signed)
Dressing changed per order. Circular wound bed of dorsal foot wound pale pink in color. No drainage and no order. Unapproximated. Pt tolerated dressing change well. Will continue to monitor.

## 2017-11-03 LAB — BASIC METABOLIC PANEL
ANION GAP: 11 (ref 5–15)
BUN: 13 mg/dL (ref 6–20)
CALCIUM: 8.8 mg/dL — AB (ref 8.9–10.3)
CO2: 24 mmol/L (ref 22–32)
Chloride: 106 mmol/L (ref 101–111)
Creatinine, Ser: 0.91 mg/dL (ref 0.61–1.24)
GFR calc non Af Amer: >60 mL/min (ref >60)
GLUCOSE: 109 mg/dL — AB (ref 65–99)
Potassium: 4 mmol/L (ref 3.5–5.1)
SODIUM: 141 mmol/L (ref 135–145)

## 2017-11-03 LAB — GLUCOSE, CAPILLARY: GLUCOSE-CAPILLARY: 79 mg/dL (ref 65–99)

## 2017-11-03 MED ORDER — POLYETHYLENE GLYCOL 3350 17 G PO PACK: 17.0000 g | PACK | Freq: Every day | ORAL | 0 refills | Status: DC

## 2017-11-03 MED ORDER — BISACODYL 10 MG RE SUPP
10.0000 mg | Freq: Every day | RECTAL | Status: DC | PRN
  Filled 2017-11-03: qty 1

## 2017-11-03 MED ORDER — SULFAMETHOXAZOLE-TRIMETHOPRIM 800-160 MG PO TABS: 1.0000 | ORAL_TABLET | Freq: Two times a day (BID) | ORAL | 0 refills | Status: AC

## 2017-11-03 MED ORDER — OXYCODONE HCL 5 MG PO TABS: 5.0000 mg | ORAL_TABLET | Freq: Four times a day (QID) | ORAL | 0 refills | Status: DC | PRN

## 2017-11-03 MED ORDER — POLYETHYLENE GLYCOL 3350 17 G PO PACK
17.0000 g | PACK | Freq: Every day | ORAL | Status: DC
  Administered 2017-11-03: 17 g via ORAL
  Filled 2017-11-03: qty 1

## 2017-11-03 NOTE — Progress Notes (Signed)
Patient is medically stable for D/C to [facility] today. Per Adrian [name] [facility] liaison patient can come today to room 808. RN will call report to [name] at [phone] and arrange EMS for transport. Clinical Child psychotherapistocial Worker (CSW) sent D/C orders to [facility] via [facility]. Patient is aware of above. CSW left patient's friend/ HPOA Adrian [name] a voicemail making her aware of above. Patient's group home ([facility]) owner Adrian [name] is aware of above. Please reconsult if future social work needs arise. CSW signing off.   [employer]iley Leanza Shepperson, LCSW [phone]

## 2017-11-03 NOTE — Discharge Summary (Addendum)
[employer] [facility] [place] at [facility]   PATIENT NAME: Adrian [name]    MR#:  [id number]  DATE OF BIRTH:  1961-08-24  DATE OF ADMISSION:  10/30/2017   ADMITTING PHYSICIAN: Oralia Manis, MD  DATE OF DISCHARGE: 11/03/17  PRIMARY CARE PHYSICIAN: Center, [facility]   ADMISSION DIAGNOSIS:   Cellulitis of left lower extremity [L03.116] Diabetic ulcer of left midfoot associated with type 2 diabetes mellitus, unspecified ulcer stage (HCC) [W09.811, L97.429]  DISCHARGE DIAGNOSIS:   Principal Problem:   Diabetic foot infection (HCC) Active Problems:   Asthma, chronic   Essential hypertension, benign   OSA (obstructive sleep apnea)   Other specified hypothyroidism   Diabetes (HCC)   SECONDARY DIAGNOSIS:   Past Medical History:  Diagnosis Date  . Asthma   . Bipolar disorder (HCC)   . Depression   . Diabetes mellitus without complication (HCC)   . Hyperlipemia   . Hypertension   . Hypothyroidism   . Sleep apnea, obstructive     HOSPITAL COURSE:   57 year old male with past medical history significant for depression, bipolar disorder, asthma, hypertension, diabetes presents to hospital secondary to worsening swelling and also rolled his left foot.  1. Left foot cellulitis and ulcer on the plantar surface-no osteomyelitis on x-ray -Appreciate podiatry input. Since it's not deep wound, no need for debridement at this time. -Received vancomycin and Zosyn in the hospital. Wound cultures growing gram-positive cocci in chains and clusters and a few gram-negative rods. -Changed to Bactrim. -Nonweightbearing of the left leg per podiatry - follow-up with podiatry in 1 week  2. Hematuria- outpatient urology follow-up recommended. No hematuria now. - also has UTI, no cultures were sent, on antibiotics  3. Bipolar with depression and anxiety-continue home medications. Patient on Trileptal, trazodone, Geodon  4. Hypertension- lisinopril and  Metoprolol   Physical therapy consult. Non Weightbearing of left leg per podiatry.  -Uses walker at baseline -Will need rehabilitation at discharge     DISCHARGE CONDITIONS:   Guarded  CONSULTS OBTAINED:   Podiatry by Dr. Ether Griffins  DRUG ALLERGIES:   No Known Allergies DISCHARGE MEDICATIONS:   Allergies as of 11/03/2017   No Known Allergies     Medication List    STOP taking these medications   azelastine 0.1 % nasal spray Commonly known as:  ASTELIN   famotidine 20 MG tablet Commonly known as:  PEPCID   nystatin powder Generic drug:  nystatin     TAKE these medications   acetaminophen 325 MG tablet Commonly known as:  TYLENOL Take 650 mg by mouth every 4 (four) hours as needed for mild pain or fever.   albuterol 108 (90 Base) MCG/ACT inhaler Commonly known as:  PROVENTIL HFA;VENTOLIN HFA Inhale 2 puffs into the lungs every 6 (six) hours as needed for wheezing or shortness of breath.   atorvastatin 40 MG tablet Commonly known as:  LIPITOR Take 40 mg by mouth daily.   bismuth subsalicylate 262 MG/15ML suspension Commonly known as:  PEPTO BISMOL Take 30 mLs by mouth every 6 (six) hours as needed for indigestion.   BREO ELLIPTA 100-25 MCG/INH Aepb Generic drug:  fluticasone furoate-vilanterol Inhale 1 puff into the lungs daily.   cholecalciferol 1000 units tablet Commonly known as:  VITAMIN D Take 1,000 Units by mouth daily.   ferrous sulfate 325 (65 FE) MG tablet Take 325 mg by mouth 2 (two) times daily with a meal.   GERI-LANTA 200-200-20 MG/5ML suspension Generic drug:  alum & mag  hydroxide-simeth Take 30 mLs by mouth as needed for indigestion or heartburn.   guaifenesin 100 MG/5ML syrup Commonly known as:  ROBITUSSIN Take 200 mg by mouth 3 (three) times daily as needed for cough.   levothyroxine 75 MCG tablet Commonly known as:  SYNTHROID, LEVOTHROID Take 75 mcg by mouth daily before breakfast.   lisinopril 2.5 MG tablet Commonly known  as:  PRINIVIL,ZESTRIL Take 2.5 mg by mouth daily.   loperamide 2 MG tablet Commonly known as:  IMODIUM A-D Take 2 mg by mouth as needed for diarrhea or loose stools.   metoprolol tartrate 100 MG tablet Commonly known as:  LOPRESSOR Take 1 tablet (100 mg total) by mouth 2 (two) times daily.   MILK OF MAGNESIA 400 MG/5ML suspension Generic drug:  magnesium hydroxide Take by mouth daily as needed for mild constipation.   montelukast 10 MG tablet Commonly known as:  SINGULAIR Take 10 mg by mouth daily.   oxcarbazepine 600 MG tablet Commonly known as:  TRILEPTAL Take 600 mg by mouth 2 (two) times daily.   oxyCODONE 5 MG immediate release tablet Commonly known as:  Oxy IR/ROXICODONE Take 1 tablet (5 mg total) by mouth every 6 (six) hours as needed for moderate pain or severe pain.   pantoprazole 40 MG tablet Commonly known as:  PROTONIX Take 1 tablet (40 mg total) by mouth daily at 12 noon.   polyethylene glycol packet Commonly known as:  MIRALAX / GLYCOLAX Take 17 g by mouth daily.   senna 8.6 MG tablet Commonly known as:  SENOKOT Take 1 tablet by mouth at bedtime as needed for constipation.   SPIRIVA RESPIMAT 2.5 MCG/ACT Aers Generic drug:  Tiotropium Bromide Monohydrate Inhale 1 spray into the lungs daily.   sulfamethoxazole-trimethoprim 800-160 MG tablet Commonly known as:  BACTRIM DS,SEPTRA DS Take 1 tablet by mouth every 12 (twelve) hours for 10 days.   traZODone 100 MG tablet Commonly known as:  DESYREL Take 100 mg by mouth at bedtime.   ziprasidone 40 MG capsule Commonly known as:  GEODON Take 40 mg by mouth 2 (two) times daily with a meal.            Durable Medical Equipment  (From admission, onward)        Start     Ordered   10/31/17 1113  For home use only DME 4 wheeled rolling walker with seat  Once    Comments:  Extra wide  Question:  Patient needs a walker to treat with the following condition  Answer:  Weakness   10/31/17 1114        DISCHARGE INSTRUCTIONS:  \ 1.  Podiatry follow-up in 1 week 2.  Urology follow-up in 1-2 weeks if hematuria persists 3.  PCP follow-up in the next 2 weeks  DIET:   Cardiac diet  ACTIVITY:   Activity as tolerated  OXYGEN:   Home Oxygen: No.  Oxygen Delivery: room air  DISCHARGE LOCATION:   nursing home   If you experience worsening of your admission symptoms, develop shortness of breath, life threatening emergency, suicidal or homicidal thoughts you must seek medical attention immediately by calling 911 or calling your MD immediately  if symptoms less severe.  You Must read complete instructions/literature along with all the possible adverse reactions/side effects for all the Medicines you take and that have been prescribed to you. Take any new Medicines after you have completely understood and accpet all the possible adverse reactions/side effects.   Please note  You  were cared for by a hospitalist during your hospital stay. If you have any questions about your discharge medications or the care you received while you were in the hospital after you are discharged, you can call the unit and asked to speak with the hospitalist on call if the hospitalist that took care of you is not available. Once you are discharged, your primary care physician will handle any further medical issues. Please note that NO REFILLS for any discharge medications will be authorized once you are discharged, as it is imperative that you return to your primary care physician (or establish a relationship with a primary care physician if you do not have one) for your aftercare needs so that they can reassess your need for medications and monitor your lab values.    On the day of Discharge:  VITAL SIGNS:   Blood pressure (!) 102/51, pulse (!) 52, temperature 98.3 F (36.8 C), temperature source Oral, resp. rate 20, height 5\' 11"  (1.803 m), weight 120.7 kg (266 lb), SpO2 99 %.  PHYSICAL EXAMINATION:     GENERAL:  57 y.o.-year-old patient lying in the bed with no acute distress.  EYES: Pupils equal, round, reactive to light and accommodation. No scleral icterus. Extraocular muscles intact.  HEENT: Head atraumatic, normocephalic. Oropharynx and nasopharynx clear.  NECK:  Supple, no jugular venous distention. No thyroid enlargement, no tenderness.  LUNGS: Normal breath sounds bilaterally, no wheezing, rales,rhonchi or crepitation. No use of accessory muscles of respiration.  CARDIOVASCULAR: S1, S2 normal. No murmurs, rubs, or gallops.  ABDOMEN: Soft, nontender, nondistended. Bowel sounds present. No organomegaly or mass.  EXTREMITIES: Left lower extremity swelling and erythema and warmth present from the foot to mid leg- much improved now, dressing in place for the left foot for ulcer on the plantar surface. Wound along the lateral edge of the left plantar surface.  Packing in place. NEUROLOGIC: Cranial nerves II through XII are intact. Muscle strength 5/5 in all extremities. Sensation intact. Gait not checked.  PSYCHIATRIC: The patient is alert and oriented x 3. Masked affect SKIN: No obvious rash, lesion, or ulcer.     DATA REVIEW:   CBC Recent Labs  Lab 10/31/17 0314 11/01/17 0630  WBC 7.0  --   HGB 10.0* 10.8*  HCT 29.4*  --   PLT 160  --     Chemistries  Recent Labs  Lab 10/30/17 2038  11/03/17 0302  NA 136   < > 141  K 3.4*   < > 4.0  CL 105   < > 106  CO2 22   < > 24  GLUCOSE 129*   < > 109*  BUN 19   < > 13  CREATININE 0.99   < > 0.91  CALCIUM 8.2*   < > 8.8*  AST 22  --   --   ALT 18  --   --   ALKPHOS 120  --   --   BILITOT 0.5  --   --    < > = values in this interval not displayed.     Microbiology Results  Results for orders placed or performed during the hospital encounter of 10/30/17  Blood culture (routine x 2)     Status: None (Preliminary result)   Collection Time: 10/30/17 10:54 PM  Result Value Ref Range Status   Specimen Description BLOOD  RIGHT ANTECUBITAL  Final   Special Requests   Final    BOTTLES DRAWN AEROBIC AND ANAEROBIC Blood Culture adequate volume  Culture   Final    NO GROWTH 4 DAYS Performed at [facility], [address] [place], [place] [address]    Report Status PENDING  Incomplete  Blood culture (routine x 2)     Status: None (Preliminary result)   Collection Time: 10/30/17 10:54 PM  Result Value Ref Range Status   Specimen Description BLOOD LEFT ANTECUBITAL  Final   Special Requests   Final    BOTTLES DRAWN AEROBIC AND ANAEROBIC Blood Culture adequate volume   Culture   Final    NO GROWTH 4 DAYS Performed at [facility] [address], [place], [place] [address]    Report Status PENDING  Incomplete  MRSA PCR Screening     Status: Abnormal   Collection Time: 10/31/17  1:05 AM  Result Value Ref Range Status   MRSA by PCR POSITIVE (A) NEGATIVE Final    Comment:        The GeneXpert MRSA Assay (FDA approved for NASAL specimens only), is one component of a comprehensive MRSA colonization surveillance program. It is not intended to diagnose MRSA infection nor to guide or monitor treatment for MRSA infections. RESULT CALLED TO, READ BACK BY AND VERIFIED WITH: [facility] RAYMOND AT 0981 10/31/17 ALV Performed at [facility], [address] [place], [place] [address]   Aerobic/Anaerobic Culture (surgical/deep wound)     Status: Abnormal (Preliminary result)   Collection Time: 10/31/17  8:44 AM  Result Value Ref Range Status   Specimen Description   Final    FOOT LEFT FOOT Performed at [facility], [address] [place], [place] [address]    Special Requests   Final    Immunocompromised Performed at [facility], [address] [place], [place] [address]    Gram Stain   Final    RARE WBC PRESENT, PREDOMINANTLY PMN RARE GRAM POSITIVE COCCI IN PAIRS IN CLUSTERS RARE GRAM POSITIVE RODS Performed at [facility], [address]  [place], [place] [address]    Culture (A)  Final    MULTIPLE ORGANISMS PRESENT, NONE PREDOMINANT NO ANAEROBES ISOLATED; CULTURE IN PROGRESS FOR 5 DAYS    Report Status PENDING  Incomplete  CULTURE, BLOOD (ROUTINE X 2) w Reflex to ID Panel     Status: None (Preliminary result)   Collection Time: 10/31/17  8:55 PM  Result Value Ref Range Status   Specimen Description BLOOD RIGHT WRIST  Final   Special Requests   Final    BOTTLES DRAWN AEROBIC AND ANAEROBIC Blood Culture adequate volume   Culture   Final    NO GROWTH 3 DAYS Performed at [facility], [address] [place], [place] [address]    Report Status PENDING  Incomplete  CULTURE, BLOOD (ROUTINE X 2) w Reflex to ID Panel     Status: None (Preliminary result)   Collection Time: 10/31/17  9:25 PM  Result Value Ref Range Status   Specimen Description BLOOD RIGHT HAND  Final   Special Requests   Final    BOTTLES DRAWN AEROBIC AND ANAEROBIC Blood Culture adequate volume   Culture   Final    NO GROWTH 3 DAYS Performed at [facility], [address] [place], [place] [address]    Report Status PENDING  Incomplete    RADIOLOGY:  No results found.   Management plans discussed with the patient, family and they are in agreement.  CODE STATUS:     Code Status Orders  (From  admission, onward)        Start     Ordered   10/31/17 0050  Full code  Continuous     10/31/17 0049    Code Status History    Date Active Date Inactive Code Status Order ID Comments User Context   09/23/2017 01:12 09/25/2017 15:31 Full Code [id number]  Bertrum SolSalary, Montell D, MD Inpatient   10/04/2015 22:26 10/20/2015 20:45 Full Code [id number]  Minor, Vilinda BlanksWilliam S, NP Inpatient   10/04/2015 21:53 10/04/2015 22:26 Full Code [id number]  Candie ChromanMinor, William S, NP Inpatient   10/02/2015 14:32 10/04/2015 21:53 Full Code [id number]  Katharina CaperVaickute, Rima, MD Inpatient   01/16/2014 03:34 01/17/2014 18:06 Full Code [id number]  Hillary BowGardner, Jared M, DO ED    Advance Directive  Documentation     Most Recent Value  Type of Advance Directive  Healthcare Power of Attorney  Pre-existing out of facility DNR order (yellow form or pink MOST form)  No data  "MOST" Form in Place?  No data      TOTAL TIME TAKING CARE OF THIS PATIENT: 38 minutes.    Enid BaasKALISETTI,Dailey Alberson M.D on 11/03/2017 at 8:53 AM  Between 7am to 6pm - Pager - [phone]  After 6pm go to [email or web address] - Scientist, research (life sciences)password [email or web address]  [facility]  Office  [phone]  CC: Primary care physician; [facility]   Note: This dictation was prepared with Dragon dictation along with smaller phrase technology. Any transcriptional errors that result from this process are unintentional.

## 2017-11-03 NOTE — Progress Notes (Signed)
Report called to nicole at [facility].  Lunch ordered for pt

## 2017-11-03 NOTE — Clinical Social Work Placement (Signed)
   CLINICAL SOCIAL WORK PLACEMENT  NOTE  Date:  11/03/2017  Patient Details  Name: Adrian DiegoCarlton B Grandpre Jr. MRN: [id number] Date of Birth: 07-17-1961  Clinical Social Work is seeking post-discharge placement for this patient at the Skilled  Nursing Facility level of care (*CSW will initial, date and re-position this form in  chart as items are completed):  Yes   Patient/family provided with [place] Clinical Social Work Department's list of facilities offering this level of care within the geographic area requested by the patient (or if unable, by the patient's family).  Yes   Patient/family informed of their freedom to choose among providers that offer the needed level of care, that participate in Medicare, Medicaid or managed care program needed by the patient, have an available bed and are willing to accept the patient.  Yes   Patient/family informed of 's ownership interest in [facility] and [facility], as well as of the fact that they are under no obligation to receive care at these facilities.  PASRR submitted to EDS on 11/02/17     PASRR number received on 11/02/17     Existing PASRR number confirmed on       FL2 transmitted to all facilities in geographic area requested by pt/family on 11/02/17     FL2 transmitted to all facilities within larger geographic area on       Patient informed that his/her managed care company has contracts with or will negotiate with certain facilities, including the following:        Yes   Patient/family informed of bed offers received.  Patient chooses bed at ([facility] )     Physician recommends and patient chooses bed at      Patient to be transferred to ([facility] ) on 11/03/17.  Patient to be transferred to facility by [facility]     Patient family notified on 11/03/17 of transfer.  Name of family member notified:  (CSW left patient's friend/ HPOA Juliette AlcideMelinda a voicemail making her aware of above. )     PHYSICIAN       Additional Comment:    _______________________________________________ Pauline Trainer, Darleen CrockerBailey M, LCSW 11/03/2017, 9:57 AM

## 2017-11-03 NOTE — Progress Notes (Signed)
Pt dc to [facility] via EMS.  Lunch never arrived.  Ems instructed to tell RN that pt needs lunch

## 2017-11-03 NOTE — Care Management (Signed)
RNCM has notified Barbara CowerJason with [facility] that patient will discharge today.  No other RNCM needs. CSW will assist with facility discharge.

## 2017-11-04 LAB — CULTURE, BLOOD (ROUTINE X 2)
CULTURE: NO GROWTH
Culture: NO GROWTH
Special Requests: ADEQUATE
Special Requests: ADEQUATE

## 2017-11-05 LAB — AEROBIC/ANAEROBIC CULTURE W GRAM STAIN (SURGICAL/DEEP WOUND)

## 2017-11-05 LAB — CULTURE, BLOOD (ROUTINE X 2)
CULTURE: NO GROWTH
Culture: NO GROWTH
SPECIAL REQUESTS: ADEQUATE
Special Requests: ADEQUATE

## 2018-01-19 ENCOUNTER — Encounter: Payer: Self-pay | Admitting: *Deleted

## 2018-01-31 ENCOUNTER — Other Ambulatory Visit: Payer: Self-pay

## 2018-01-31 DIAGNOSIS — Z1211 Encounter for screening for malignant neoplasm of colon: Secondary | ICD-10-CM

## 2018-01-31 MED ORDER — PEG 3350-KCL-NA BICARB-NACL 420 G PO SOLR: 4000.0000 mL | Freq: Once | ORAL | 0 refills | Status: AC

## 2018-01-31 NOTE — Progress Notes (Signed)
Spoke to [place] (caregiver) at [phone].  Faxed letters to [phone] along with Rx for the pharmacy.   Alona BeneJoyce is to ensure patient's prep and will forward Rx to internal pharmacy.

## 2018-02-01 ENCOUNTER — Telehealth: Payer: Self-pay | Admitting: Gastroenterology

## 2018-02-01 NOTE — Telephone Encounter (Signed)
Alona BeneJoyce left a vm to reschedule pt colonoscopy to 02/14/18 instead  Of 02/15/18 please call joyce back at [phone]

## 2018-02-05 ENCOUNTER — Telehealth: Payer: Self-pay | Admitting: Gastroenterology

## 2018-02-05 NOTE — Telephone Encounter (Signed)
Alona BeneJoyce left vm regarding pt she states she missed a call her number is [phone] or [phone]

## 2018-02-05 NOTE — Telephone Encounter (Signed)
Returned Joyce's phone call to reschedule Adrian [name] to 5/1.   Unable to leave vm due to box being full.   Changed date per request. Please advise Adrian BeneJoyce if she calls back.

## 2018-02-09 LAB — HIV ANTIBODY (ROUTINE TESTING W REFLEX): HIV SCREEN 4TH GENERATION: NONREACTIVE

## 2018-02-14 ENCOUNTER — Ambulatory Visit: Payer: Medicare Other | Admitting: Anesthesiology

## 2018-02-14 ENCOUNTER — Encounter
Admission: RE | Disposition: A | Discharge status: Home / Self Care | Payer: Self-pay | Source: Ambulatory Visit | Attending: Gastroenterology

## 2018-02-14 ENCOUNTER — Other Ambulatory Visit: Payer: Self-pay

## 2018-02-14 ENCOUNTER — Encounter: Payer: Self-pay | Admitting: Student

## 2018-02-14 ENCOUNTER — Ambulatory Visit
Admission: RE | Admit: 2018-02-14 | Discharge: 2018-02-14 | Disposition: A | Discharge status: Home / Self Care | Payer: Medicare Other | Source: Ambulatory Visit | Attending: Gastroenterology | Admitting: Gastroenterology

## 2018-02-14 DIAGNOSIS — E785 Hyperlipidemia, unspecified: Secondary | ICD-10-CM | POA: Insufficient documentation

## 2018-02-14 DIAGNOSIS — F319 Bipolar disorder, unspecified: Secondary | ICD-10-CM | POA: Diagnosis not present

## 2018-02-14 DIAGNOSIS — I69354 Hemiplegia and hemiparesis following cerebral infarction affecting left non-dominant side: Secondary | ICD-10-CM | POA: Diagnosis not present

## 2018-02-14 DIAGNOSIS — J45909 Unspecified asthma, uncomplicated: Secondary | ICD-10-CM | POA: Insufficient documentation

## 2018-02-14 DIAGNOSIS — Z5309 Procedure and treatment not carried out because of other contraindication: Secondary | ICD-10-CM | POA: Insufficient documentation

## 2018-02-14 DIAGNOSIS — Z9989 Dependence on other enabling machines and devices: Secondary | ICD-10-CM | POA: Insufficient documentation

## 2018-02-14 DIAGNOSIS — G4730 Sleep apnea, unspecified: Secondary | ICD-10-CM | POA: Diagnosis not present

## 2018-02-14 DIAGNOSIS — Z1211 Encounter for screening for malignant neoplasm of colon: Secondary | ICD-10-CM

## 2018-02-14 DIAGNOSIS — I10 Essential (primary) hypertension: Secondary | ICD-10-CM | POA: Diagnosis not present

## 2018-02-14 DIAGNOSIS — E1140 Type 2 diabetes mellitus with diabetic neuropathy, unspecified: Secondary | ICD-10-CM | POA: Insufficient documentation

## 2018-02-14 DIAGNOSIS — E039 Hypothyroidism, unspecified: Secondary | ICD-10-CM | POA: Insufficient documentation

## 2018-02-14 HISTORY — DX: Psoriasis, unspecified: L40.9

## 2018-02-14 HISTORY — DX: Anemia, unspecified: D64.9

## 2018-02-14 SURGERY — COLONOSCOPY WITH PROPOFOL
Anesthesia: General

## 2018-02-14 MED ORDER — SODIUM CHLORIDE 0.9 % IV SOLN: INTRAVENOUS | Status: DC

## 2018-02-14 NOTE — Progress Notes (Signed)
Procedure cancelled due to poor prep. 

## 2018-02-14 NOTE — Anesthesia Preprocedure Evaluation (Deleted)
Anesthesia Evaluation  Patient identified by MRN, date of birth, ID band Patient awake    Reviewed: Allergy & Precautions, H&P , NPO status , reviewed documented beta blocker date and time   Airway Mallampati: II  TM Distance: >3 FB Neck ROM: full   Comment: S/P trach p CVA, healed Dental  (+) Missing, Poor Dentition   Pulmonary asthma , sleep apnea and Continuous Positive Airway Pressure Ventilation , pneumonia, Current Smoker,    Pulmonary exam normal        Cardiovascular hypertension, Normal cardiovascular exam     Neuro/Psych PSYCHIATRIC DISORDERS Depression Bipolar Disorder L hand weakness CVA, Residual Symptoms    GI/Hepatic neg GERD  ,  Endo/Other  diabetesHypothyroidism   Renal/GU Renal disease     Musculoskeletal   Abdominal   Peds  Hematology   Anesthesia Other Findings Left hand weakness 07/05/2016 Numbness and tingling 07/05/2016 Acute kidney injury , unspecified 09/23/2014 Diabetic Charcot foot 09/22/2014 Asthma, unspecified 10/05/2013 HTN (hypertension) 10/05/2013 Hyperlipidemia, unspecified 10/05/2013 Hypothyroid, unspecified 10/05/2013 DMII (diabetes mellitus, type 2) 10/05/2013 Diabetic neuropathy , unspecified 10/05/2013 Osteomyelitis 10/05/2013 Bipolar 1 disorder 10/05/2013 Sepsis   Reproductive/Obstetrics                            Anesthesia Physical Anesthesia Plan  ASA: III  Anesthesia Plan: General   Post-op Pain Management:    Induction:   PONV Risk Score and Plan: 2 and TIVA  Airway Management Planned:   Additional Equipment:   Intra-op Plan:   Post-operative Plan:   Informed Consent: I have reviewed the patients History and Physical, chart, labs and discussed the procedure including the risks, benefits and alternatives for the proposed anesthesia with the patient or authorized representative who has indicated his/her understanding and  acceptance.   Dental Advisory Given  Plan Discussed with: CRNA  Anesthesia Plan Comments:         Anesthesia Quick Evaluation

## 2018-03-06 ENCOUNTER — Emergency Department: Payer: Medicare Other

## 2018-03-06 ENCOUNTER — Other Ambulatory Visit: Payer: Self-pay

## 2018-03-06 ENCOUNTER — Encounter: Payer: Self-pay | Admitting: Emergency Medicine

## 2018-03-06 ENCOUNTER — Emergency Department
Admission: EM | Admit: 2018-03-06 | Discharge: 2018-03-06 | Disposition: A | Discharge status: Home / Self Care | Payer: Medicare Other | Attending: Student in an Organized Health Care Education/Training Program | Admitting: Student in an Organized Health Care Education/Training Program

## 2018-03-06 DIAGNOSIS — Z79899 Other long term (current) drug therapy: Secondary | ICD-10-CM | POA: Insufficient documentation

## 2018-03-06 DIAGNOSIS — L97429 Non-pressure chronic ulcer of left heel and midfoot with unspecified severity: Secondary | ICD-10-CM | POA: Insufficient documentation

## 2018-03-06 DIAGNOSIS — I10 Essential (primary) hypertension: Secondary | ICD-10-CM | POA: Insufficient documentation

## 2018-03-06 DIAGNOSIS — M79605 Pain in left leg: Secondary | ICD-10-CM | POA: Diagnosis not present

## 2018-03-06 DIAGNOSIS — E11621 Type 2 diabetes mellitus with foot ulcer: Secondary | ICD-10-CM

## 2018-03-06 DIAGNOSIS — E039 Hypothyroidism, unspecified: Secondary | ICD-10-CM | POA: Insufficient documentation

## 2018-03-06 DIAGNOSIS — J45909 Unspecified asthma, uncomplicated: Secondary | ICD-10-CM | POA: Diagnosis not present

## 2018-03-06 DIAGNOSIS — R2242 Localized swelling, mass and lump, left lower limb: Secondary | ICD-10-CM | POA: Diagnosis present

## 2018-03-06 DIAGNOSIS — L03116 Cellulitis of left lower limb: Secondary | ICD-10-CM | POA: Diagnosis not present

## 2018-03-06 DIAGNOSIS — F17200 Nicotine dependence, unspecified, uncomplicated: Secondary | ICD-10-CM | POA: Insufficient documentation

## 2018-03-06 DIAGNOSIS — E1140 Type 2 diabetes mellitus with diabetic neuropathy, unspecified: Secondary | ICD-10-CM | POA: Insufficient documentation

## 2018-03-06 LAB — CBC WITH DIFFERENTIAL/PLATELET
Basophils Absolute: 0 10*3/uL (ref 0–0.1)
Basophils Relative: 0 %
EOS ABS: 0 10*3/uL (ref 0–0.7)
EOS PCT: 1 %
HCT: 32.3 % — ABNORMAL LOW (ref 40.0–52.0)
HEMOGLOBIN: 11.5 g/dL — AB (ref 13.0–18.0)
LYMPHS ABS: 1.7 10*3/uL (ref 1.0–3.6)
LYMPHS PCT: 19 %
MCH: 31.5 pg (ref 26.0–34.0)
MCHC: 35.7 g/dL (ref 32.0–36.0)
MCV: 88.3 fL (ref 80.0–100.0)
MONOS PCT: 7 %
Monocytes Absolute: 0.6 10*3/uL (ref 0.2–1.0)
NEUTROS PCT: 73 %
Neutro Abs: 6.3 10*3/uL (ref 1.4–6.5)
PLATELETS: 154 10*3/uL (ref 150–440)
RBC: 3.66 MIL/uL — AB (ref 4.40–5.90)
RDW: 14.1 % (ref 11.5–14.5)
WBC: 8.6 10*3/uL (ref 3.8–10.6)

## 2018-03-06 LAB — COMPREHENSIVE METABOLIC PANEL
ALT: 25 U/L (ref 17–63)
AST: 26 U/L (ref 15–41)
Albumin: 3.5 g/dL (ref 3.5–5.0)
Alkaline Phosphatase: 86 U/L (ref 38–126)
Anion gap: 9 (ref 5–15)
BUN: 20 mg/dL (ref 6–20)
CHLORIDE: 106 mmol/L (ref 101–111)
CO2: 22 mmol/L (ref 22–32)
Calcium: 8.3 mg/dL — ABNORMAL LOW (ref 8.9–10.3)
Creatinine, Ser: 1.03 mg/dL (ref 0.61–1.24)
Glucose, Bld: 102 mg/dL — ABNORMAL HIGH (ref 65–99)
Potassium: 3.3 mmol/L — ABNORMAL LOW (ref 3.5–5.1)
Sodium: 137 mmol/L (ref 135–145)
Total Bilirubin: 0.9 mg/dL (ref 0.3–1.2)
Total Protein: 6.9 g/dL (ref 6.5–8.1)

## 2018-03-06 LAB — PROTIME-INR
INR: 0.97
PROTHROMBIN TIME: 12.8 s (ref 11.4–15.2)

## 2018-03-06 LAB — LACTIC ACID, PLASMA: LACTIC ACID, VENOUS: 0.7 mmol/L (ref 0.5–1.9)

## 2018-03-06 MED ORDER — AMOXICILLIN-POT CLAVULANATE 875-125 MG PO TABS
1.0000 | ORAL_TABLET | Freq: Once | ORAL | Status: AC
  Administered 2018-03-06: 1 via ORAL
  Filled 2018-03-06: qty 1

## 2018-03-06 MED ORDER — CULTURELLE DIGESTIVE HEALTH PO CAPS: 1.0000 | ORAL_CAPSULE | Freq: Every day | ORAL | 0 refills | Status: DC

## 2018-03-06 MED ORDER — AMOXICILLIN-POT CLAVULANATE 875-125 MG PO TABS: 1.0000 | ORAL_TABLET | Freq: Two times a day (BID) | ORAL | 0 refills | Status: AC

## 2018-03-06 NOTE — ED Provider Notes (Signed)
[facility] Emergency Department Provider Note    First MD Initiated Contact with Patient 03/06/18 1633     (approximate)  I have reviewed the triage vital signs and the nursing notes.   HISTORY  Chief Complaint Leg Pain and Leg Swelling    HPI Adrian [name]. is a 57 y.o. male with a history of diabetes and chronic diabetic foot ulcer presents to the ER due to worsening redness and swelling of the left leg.  Denies any pain.  Is not been on any antibiotics.  States that he thinks the ulcer is actually improving and he has follow-up with podiatry on Thursday.  Denies any fevers at home.  No nausea or vomiting.  No other associated symptoms.  Recently came today was because his home health nurse checked on him so the redness was much worse than previous and wanted him to be evaluated  Past Medical History:  Diagnosis Date  . Anemia   . Asthma   . Bipolar disorder (HCC)   . Depression   . Diabetes mellitus without complication (HCC)   . Hyperlipemia   . Hypertension   . Hypothyroidism   . Psoriasis   . Sleep apnea, obstructive    Family History  Problem Relation Age of Onset  . Heart failure Mother   . Stroke Father    Past Surgical History:  Procedure Laterality Date  . bone removed     bone removed in foot due to infection   . TONSILLECTOMY     Patient Active Problem List   Diagnosis Date Noted  . Diabetic foot infection (HCC) 09/25/2017  . Foot pain 09/24/2017  . Acute osteomyelitis of left foot (HCC) 09/23/2017  . Left hand weakness 07/05/2016  . Numbness and tingling 07/05/2016  . Hypernatremia   . Candidal pneumonia (HCC)   . Abdominal wall cellulitis   . Bacteremia due to Streptococcus   . Dysphagia   . OSA (obstructive sleep apnea)   . Obesity hypoventilation syndrome (HCC)   . Bipolar I disorder (HCC)   . Other specified hypothyroidism   . Tracheostomy care (HCC)   . C. difficile colitis   . Acute respiratory failure  with hypoxia (HCC)   . Encounter for imaging study to confirm nasogastric (NG) tube placement   . History of ETT   . Acute respiratory failure (HCC)   . ARDS (adult respiratory distress syndrome) (HCC)   . Acute renal failure (HCC)   . Respiratory failure (HCC) 10/04/2015  . Sepsis (HCC) 10/02/2015  . Acute kidney injury (HCC) 09/23/2014  . Allergic rhinitis, cause unspecified 02/24/2014  . Unspecified constipation 02/24/2014  . Essential hypertension, benign 02/24/2014  . UTI (lower urinary tract infection) 01/16/2014  . Altered mental status 01/16/2014  . Diabetic osteomyelitis (HCC) 01/03/2014  . Diabetic neuropathy (HCC) 01/03/2014  . Obstructive sleep apnea 01/03/2014  . Asthma, chronic 01/03/2014  . Seborrheic dermatitis of scalp 01/03/2014  . Morbid obesity (HCC) 01/03/2014  . HTN (hypertension) 10/05/2013  . Hyperlipidemia, unspecified 10/05/2013  . Osteomyelitis (HCC) 10/05/2013      Prior to Admission medications   Medication Sig Start Date End Date Taking? Authorizing Provider  acetaminophen (TYLENOL) 325 MG tablet Take 650 mg by mouth every 4 (four) hours as needed for mild pain or fever.     [provider]  albuterol (PROVENTIL HFA;VENTOLIN HFA) 108 (90 Base) MCG/ACT inhaler Inhale 2 puffs into the lungs every 6 (six) hours as needed for wheezing or  shortness of breath.    [provider]  alum & mag hydroxide-simeth (GERI-LANTA) 200-200-20 MG/5ML suspension Take 30 mLs by mouth as needed for indigestion or heartburn.     [provider]  amoxicillin-clavulanate (AUGMENTIN) 875-125 MG tablet Take 1 tablet by mouth 2 (two) times daily for 7 days. 03/06/18 03/13/18  Willy Eddy, MD  atorvastatin (LIPITOR) 40 MG tablet Take 40 mg by mouth daily.    [provider]  bismuth subsalicylate (PEPTO BISMOL) 262 MG/15ML suspension Take 30 mLs by mouth every 6 (six) hours as needed for indigestion.     [provider]    cholecalciferol (VITAMIN D) 1000 units tablet Take 1,000 Units by mouth daily.    [provider]  ferrous sulfate 325 (65 FE) MG tablet Take 325 mg by mouth 2 (two) times daily with a meal.    [provider]  fluticasone furoate-vilanterol (BREO ELLIPTA) 100-25 MCG/INH AEPB Inhale 1 puff into the lungs daily.    [provider]  guaifenesin (ROBITUSSIN) 100 MG/5ML syrup Take 200 mg by mouth 3 (three) times daily as needed for cough.    [provider]  Lactobacillus-Inulin (CULTURELLE DIGESTIVE HEALTH) CAPS Take 1 capsule by mouth daily. 03/06/18   Willy Eddy, MD  levothyroxine (SYNTHROID, LEVOTHROID) 75 MCG tablet Take 75 mcg by mouth daily before breakfast.    [provider]  lisinopril (PRINIVIL,ZESTRIL) 2.5 MG tablet Take 2.5 mg by mouth daily.    [provider]  loperamide (IMODIUM A-D) 2 MG tablet Take 2 mg by mouth as needed for diarrhea or loose stools.    [provider]  magnesium hydroxide (MILK OF MAGNESIA) 400 MG/5ML suspension Take by mouth daily as needed for mild constipation.    [provider]  metoprolol (LOPRESSOR) 100 MG tablet Take 1 tablet (100 mg total) by mouth 2 (two) times daily. 10/20/15   Drema Dallas, MD  montelukast (SINGULAIR) 10 MG tablet Take 10 mg by mouth daily.    [provider]  oxcarbazepine (TRILEPTAL) 600 MG tablet Take 600 mg by mouth 2 (two) times daily.    [provider]  oxyCODONE (OXY IR/ROXICODONE) 5 MG immediate release tablet Take 1 tablet (5 mg total) by mouth every 6 (six) hours as needed for moderate pain or severe pain. 11/03/17   Enid Baas, MD  pantoprazole (PROTONIX) 40 MG tablet Take 1 tablet (40 mg total) by mouth daily at 12 noon. 10/20/15   Drema Dallas, MD  polyethylene glycol [facility] / Ethelene Hal) packet Take 17 g by mouth daily. 11/03/17   Enid Baas, MD  senna (SENOKOT) 8.6 MG tablet Take 1 tablet by mouth at bedtime as  needed for constipation.    [provider]  Tiotropium Bromide Monohydrate (SPIRIVA RESPIMAT) 2.5 MCG/ACT AERS Inhale 1 spray into the lungs daily.    [provider]  traZODone (DESYREL) 100 MG tablet Take 100 mg by mouth at bedtime.    [provider]  ziprasidone (GEODON) 40 MG capsule Take 40 mg by mouth 2 (two) times daily with a meal.  09/07/15   [provider]    Allergies Patient has no known allergies.    Social History Social History   Tobacco Use  . Smoking status: Current Every Day Smoker    Packs/day: 0.25  . Smokeless tobacco: Never Used  Substance Use Topics  . Alcohol use: No  . Drug use: No    Review of Systems Patient denies headaches,  rhinorrhea, blurry vision, numbness, shortness of breath, chest pain, edema, cough, abdominal pain, nausea, vomiting, diarrhea, dysuria, fevers, rashes or hallucinations unless otherwise stated above in HPI. ____________________________________________   PHYSICAL EXAM:  VITAL SIGNS: Vitals:   03/06/18 1544  BP: 114/77  Pulse: 72  Resp: (!) 22  Temp: 99.1 F (37.3 C)  SpO2: 97%    Constitutional: Alert and oriented. Well appearing and in no acute distress. Eyes: Conjunctivae are normal.  Head: Atraumatic. Nose: No congestion/rhinnorhea. Mouth/Throat: Mucous membranes are moist.   Neck: No stridor. Painless ROM.  Cardiovascular: Normal rate, regular rhythm. Grossly normal heart sounds.  Good peripheral circulation. Respiratory: Normal respiratory effort.  No retractions. Lungs CTAB. Gastrointestinal: Soft and nontender. No distention. No abdominal bruits. No CVA tenderness. Genitourinary: deferred Musculoskeletal: No lower extremity tenderness.  Small 2cm diabetic foot wound to left lateral foot.  No tunneling or purulent drainage at this time.  There is some scant serous drainage.  There is no significant surrounding erythema.  Does have venous stasis changes as well as cellulitis  to the left mid calf.  No joint effusion. Neurologic:  Normal speech and language. No gross focal neurologic deficits are appreciated. No facial droop Skin:  Skin is warm, dry and intact. No rash noted. Psychiatric: Mood and affect are normal. Speech and behavior are normal.  ____________________________________________   LABS (all labs ordered are listed, but only abnormal results are displayed)  Results for orders placed or performed during the hospital encounter of 03/06/18 (from the past 24 hour(s))  CBC with Differential/Platelet     Status: Abnormal   Collection Time: 03/06/18  3:49 PM  Result Value Ref Range   WBC 8.6 3.8 - 10.6 K/uL   RBC 3.66 (L) 4.40 - 5.90 MIL/uL   Hemoglobin 11.5 (L) 13.0 - 18.0 g/dL   HCT 16.1 (L) 09.6 - 04.5 %   MCV 88.3 80.0 - 100.0 fL   MCH 31.5 26.0 - 34.0 pg   MCHC 35.7 32.0 - 36.0 g/dL   RDW 40.9 81.1 - 91.4 %   Platelets 154 150 - 440 K/uL   Neutrophils Relative % 73 %   Neutro Abs 6.3 1.4 - 6.5 K/uL   Lymphocytes Relative 19 %   Lymphs Abs 1.7 1.0 - 3.6 K/uL   Monocytes Relative 7 %   Monocytes Absolute 0.6 0.2 - 1.0 K/uL   Eosinophils Relative 1 %   Eosinophils Absolute 0.0 0 - 0.7 K/uL   Basophils Relative 0 %   Basophils Absolute 0.0 0 - 0.1 K/uL  Comprehensive metabolic panel     Status: Abnormal   Collection Time: 03/06/18  3:49 PM  Result Value Ref Range   Sodium 137 135 - 145 mmol/L   Potassium 3.3 (L) 3.5 - 5.1 mmol/L   Chloride 106 101 - 111 mmol/L   CO2 22 22 - 32 mmol/L   Glucose, Bld 102 (H) 65 - 99 mg/dL   BUN 20 6 - 20 mg/dL   Creatinine, Ser 7.82 0.61 - 1.24 mg/dL   Calcium 8.3 (L) 8.9 - 10.3 mg/dL   Total Protein 6.9 6.5 - 8.1 g/dL   Albumin 3.5 3.5 - 5.0 g/dL   AST 26 15 - 41 U/L   ALT 25 17 - 63 U/L   Alkaline Phosphatase 86 38 - 126 U/L   Total Bilirubin 0.9 0.3 - 1.2 mg/dL   GFR calc non Af Amer >60 >60 mL/min   GFR calc Af Amer >60 >60 mL/min  Anion gap 9 5 - 15  Lactic acid, plasma     Status: None    Collection Time: 03/06/18  3:49 PM  Result Value Ref Range   Lactic Acid, Venous 0.7 0.5 - 1.9 mmol/L  Protime-INR     Status: None   Collection Time: 03/06/18  3:49 PM  Result Value Ref Range   Prothrombin Time 12.8 11.4 - 15.2 seconds   INR 0.97    ____________________________________________ ____________________________________________  RADIOLOGY  I personally reviewed all radiographic images ordered to evaluate for the above acute complaints and reviewed radiology reports and findings.  These findings were personally discussed with the patient.  Please see medical record for radiology report.  ____________________________________________   PROCEDURES  Procedure(s) performed:  Procedures    Critical Care performed: no ____________________________________________   INITIAL IMPRESSION / ASSESSMENT AND PLAN / ED COURSE  Pertinent labs & imaging results that were available during my care of the patient were reviewed by me and considered in my medical decision making (see chart for details).  DDX: Cellulitis, DVT, venous stasis, osteomyelitis, abscess, diabetic foot wound  Adrian [name]. is a 57 y.o. who presents to the ED with symptoms as described above.  Patient otherwise well-appearing and grossly asymptomatic.  Not toxic appearing.  Does have some cellulitic changes but none that seem to be directly related to the diabetic foot wound.  Ultrasound ordered to evaluate for DVT.  X-ray to be ordered to evaluate for evidence of worsening osteolytic changes.  Clinical Course as of Mar 06 1901  Tue Mar 06, 2018  1807 Just discussed the case with Dr. Ether Griffins, the patient's primary podiatrist, who states that he has been following this ulcer for quite some time and the ulcer itself have been well-appearing.  States that on previous radiographs there were osteolytic changes but MRI did not show any evidence of acute osteomyelitis.  In the absence of leukocytosis, fever or  lactic acidosis or systemic toxicity does not seem to meet any indication for hospitalization at this time and the patient has follow-up on Thursday   [PR]    Clinical Course User Index [PR] Willy Eddy, MD     As part of my medical decision making, I reviewed the following data within the electronic MEDICAL RECORD NUMBER Nursing notes reviewed and incorporated, Labs reviewed, notes from prior ED visits and [place] Controlled Substance Database   ____________________________________________   FINAL CLINICAL IMPRESSION(S) / ED DIAGNOSES  Final diagnoses:  Cellulitis of left lower extremity  Diabetic ulcer of left midfoot associated with type 2 diabetes mellitus, unspecified ulcer stage (HCC)      NEW MEDICATIONS STARTED DURING THIS VISIT:  New Prescriptions   AMOXICILLIN-CLAVULANATE (AUGMENTIN) 875-125 MG TABLET    Take 1 tablet by mouth 2 (two) times daily for 7 days.   LACTOBACILLUS-INULIN (CULTURELLE DIGESTIVE HEALTH) CAPS    Take 1 capsule by mouth daily.     Note:  This document was prepared using Dragon voice recognition software and may include unintentional dictation errors.    Willy Eddy, MD 03/06/18 [id number]

## 2018-03-06 NOTE — ED Notes (Signed)
Called Caregiver Doristine Mango and he said he is on his way to pick up patient and will be here in about 30 minutes.

## 2018-03-06 NOTE — ED Notes (Signed)
Pt's caregiver Doristine Mango called and left message that pt is up for discharge and is ready to be picked up at this time.

## 2018-03-06 NOTE — ED Notes (Signed)
Pt verbalized discharged instructions and has no questions at this time

## 2018-03-06 NOTE — ED Triage Notes (Signed)
Pt reports that his leg on Sunday, he developed left leg pain, he showed his home health nurse and she called Dr. Ether Griffins and they told him to come here because his leg is infected. His left leg is red and swollen. He has a dark red line that goes around the circumference of his left lower leg.

## 2018-03-06 NOTE — ED Notes (Signed)
Pt's leg rewrapped with gauze

## 2018-03-11 LAB — CULTURE, BLOOD (ROUTINE X 2)
Culture: NO GROWTH
Culture: NO GROWTH

## 2018-04-30 ENCOUNTER — Other Ambulatory Visit: Payer: Self-pay | Admitting: Podiatry

## 2018-05-03 ENCOUNTER — Other Ambulatory Visit: Payer: Self-pay

## 2018-05-03 ENCOUNTER — Encounter
Admission: RE | Admit: 2018-05-03 | Discharge: 2018-05-03 | Disposition: A | Discharge status: Home / Self Care | Payer: Medicare Other | Source: Ambulatory Visit | Attending: Podiatry | Admitting: Podiatry

## 2018-05-03 DIAGNOSIS — Z01812 Encounter for preprocedural laboratory examination: Secondary | ICD-10-CM | POA: Insufficient documentation

## 2018-05-03 DIAGNOSIS — I4892 Unspecified atrial flutter: Secondary | ICD-10-CM | POA: Insufficient documentation

## 2018-05-03 DIAGNOSIS — Z01818 Encounter for other preprocedural examination: Secondary | ICD-10-CM | POA: Diagnosis present

## 2018-05-03 DIAGNOSIS — E119 Type 2 diabetes mellitus without complications: Secondary | ICD-10-CM | POA: Insufficient documentation

## 2018-05-03 DIAGNOSIS — I498 Other specified cardiac arrhythmias: Secondary | ICD-10-CM | POA: Diagnosis not present

## 2018-05-03 HISTORY — DX: Cerebral infarction, unspecified: I63.9

## 2018-05-03 HISTORY — DX: Seborrheic dermatitis, unspecified: L21.9

## 2018-05-03 HISTORY — DX: Type 2 diabetes mellitus with diabetic neuropathic arthropathy: E11.610

## 2018-05-03 HISTORY — DX: Dermatitis, unspecified: L30.9

## 2018-05-03 HISTORY — DX: Polyneuropathy, unspecified: G62.9

## 2018-05-03 LAB — BASIC METABOLIC PANEL
Anion gap: 7 (ref 5–15)
BUN: 18 mg/dL (ref 6–20)
CALCIUM: 9 mg/dL (ref 8.9–10.3)
CO2: 26 mmol/L (ref 22–32)
CREATININE: 0.83 mg/dL (ref 0.61–1.24)
Chloride: 108 mmol/L (ref 98–111)
Glucose, Bld: 107 mg/dL — ABNORMAL HIGH (ref 70–99)
Potassium: 4.1 mmol/L (ref 3.5–5.1)
SODIUM: 141 mmol/L (ref 135–145)

## 2018-05-03 NOTE — Patient Instructions (Signed)
Your procedure is scheduled on:05/11/18 Report to Day Surgery. [facility] SECOND FLOOR To find out your arrival time please call [phone] between 1PM - 3PM on 05/10/18.  Remember: Instructions that are not followed completely may result in serious medical risk,  up to and including death, or upon the discretion of your surgeon and anesthesiologist your  surgery may need to be rescheduled.     _X__ 1. Do not eat food after midnight the night before your procedure.                 No gum chewing or hard candies. You may drink clear liquids up to 2 hours                 before you are scheduled to arrive for your surgery- DO not drink clear                 liquids within 2 hours of the start of your surgery.                 Clear Liquids include:  water, apple juice without pulp, clear carbohydrate                 drink such as Clearfast of Gatorade, Black Coffee or Tea (Do not add                 anything to coffee or tea).  __X__2.  On the morning of surgery brush your teeth with toothpaste and water, you                may rinse your mouth with mouthwash if you wish.  Do not swallow any toothpaste of mouthwash.     _X__ 3.  No Alcohol for 24 hours before or after surgery.   _X__ 4.  Do Not Smoke or use e-cigarettes For 24 Hours Prior to Your Surgery.                 Do not use any chewable tobacco products for at least 6 hours prior to                 surgery.  ____  5.  Bring all medications with you on the day of surgery if instructed.   _X___  6.  Notify your doctor if there is any change in your medical condition      (cold, fever, infections).     Do not wear jewelry, make-up, hairpins, clips or nail polish. Do not wear lotions, powders, or perfumes. You may wear deodorant. Do not shave 48 hours prior to surgery. Men may shave face and neck. Do not bring valuables to the hospital.    [facility] is not responsible for any belongings or  valuables.  Contacts, dentures or bridgework may not be worn into surgery. Leave your suitcase in the car. After surgery it may be brought to your room. For patients admitted to the hospital, discharge time is determined by your treatment team.   Patients discharged the day of surgery will not be allowed to drive home.   Please read over the following fact sheets that you were given:   Surgical Site Infection Prevention  / SPIROMETRY  __X__ Take these medicines the morning of surgery with A SIP OF WATER:    1.LEVOTHYROXINE  2. METOPROLOL  3.MONTELUKAST   4.OXCARBAZEPINE  5.PANTOPRAZOLE  6.ZIPRASIDONE  ____ Fleet Enema (as directed)   ____ Use CHG Soap as directed  __X__ Use inhalers on the day of surgery   AND BRING AM OF SURGERY  ____ Stop metformin 2 days prior to surgery    ____ Take 1/2 of usual insulin dose the night before surgery. No insulin the morning          of surgery.   ____ Stop Coumadin/Plavix/aspirin on  ____ Stop Anti-inflammatories on    ____ Stop supplements until after surgery.    _X___ Bring C-Pap to the hospital.    PRACTICE SPIROMETRY AND BRING AM OF SURGERY

## 2018-05-04 ENCOUNTER — Other Ambulatory Visit: Payer: Medicare Other

## 2018-05-09 MED ORDER — PROPOFOL 10 MG/ML IV BOLUS
INTRAVENOUS | Status: AC
  Filled 2018-05-09: qty 20

## 2018-05-09 MED ORDER — SUCCINYLCHOLINE CHLORIDE 20 MG/ML IJ SOLN
INTRAMUSCULAR | Status: AC
  Filled 2018-05-09: qty 1

## 2018-05-10 MED ORDER — CEFAZOLIN SODIUM-DEXTROSE 2-4 GM/100ML-% IV SOLN
2.0000 g | INTRAVENOUS | Status: AC
  Administered 2018-05-11: 2 g via INTRAVENOUS

## 2018-05-11 ENCOUNTER — Ambulatory Visit
Admission: RE | Admit: 2018-05-11 | Discharge: 2018-05-11 | Disposition: A | Discharge status: Home / Self Care | Payer: Medicare Other | Source: Ambulatory Visit | Attending: Podiatry | Admitting: Podiatry

## 2018-05-11 ENCOUNTER — Encounter
Admission: RE | Disposition: A | Discharge status: Home / Self Care | Payer: Self-pay | Source: Ambulatory Visit | Attending: Podiatry

## 2018-05-11 ENCOUNTER — Ambulatory Visit: Payer: Medicare Other | Admitting: Family

## 2018-05-11 ENCOUNTER — Other Ambulatory Visit: Payer: Self-pay

## 2018-05-11 DIAGNOSIS — I69354 Hemiplegia and hemiparesis following cerebral infarction affecting left non-dominant side: Secondary | ICD-10-CM | POA: Diagnosis not present

## 2018-05-11 DIAGNOSIS — Z79899 Other long term (current) drug therapy: Secondary | ICD-10-CM | POA: Insufficient documentation

## 2018-05-11 DIAGNOSIS — E039 Hypothyroidism, unspecified: Secondary | ICD-10-CM | POA: Insufficient documentation

## 2018-05-11 DIAGNOSIS — J45909 Unspecified asthma, uncomplicated: Secondary | ICD-10-CM | POA: Diagnosis not present

## 2018-05-11 DIAGNOSIS — E785 Hyperlipidemia, unspecified: Secondary | ICD-10-CM | POA: Insufficient documentation

## 2018-05-11 DIAGNOSIS — I10 Essential (primary) hypertension: Secondary | ICD-10-CM | POA: Insufficient documentation

## 2018-05-11 DIAGNOSIS — Z87891 Personal history of nicotine dependence: Secondary | ICD-10-CM | POA: Insufficient documentation

## 2018-05-11 DIAGNOSIS — Z79890 Hormone replacement therapy (postmenopausal): Secondary | ICD-10-CM | POA: Diagnosis not present

## 2018-05-11 DIAGNOSIS — E1140 Type 2 diabetes mellitus with diabetic neuropathy, unspecified: Secondary | ICD-10-CM | POA: Diagnosis not present

## 2018-05-11 DIAGNOSIS — M869 Osteomyelitis, unspecified: Secondary | ICD-10-CM | POA: Insufficient documentation

## 2018-05-11 DIAGNOSIS — Z7951 Long term (current) use of inhaled steroids: Secondary | ICD-10-CM | POA: Insufficient documentation

## 2018-05-11 DIAGNOSIS — E1169 Type 2 diabetes mellitus with other specified complication: Secondary | ICD-10-CM | POA: Insufficient documentation

## 2018-05-11 DIAGNOSIS — E11610 Type 2 diabetes mellitus with diabetic neuropathic arthropathy: Secondary | ICD-10-CM | POA: Insufficient documentation

## 2018-05-11 DIAGNOSIS — E11621 Type 2 diabetes mellitus with foot ulcer: Secondary | ICD-10-CM | POA: Insufficient documentation

## 2018-05-11 DIAGNOSIS — F319 Bipolar disorder, unspecified: Secondary | ICD-10-CM | POA: Insufficient documentation

## 2018-05-11 DIAGNOSIS — G4730 Sleep apnea, unspecified: Secondary | ICD-10-CM | POA: Diagnosis not present

## 2018-05-11 DIAGNOSIS — D649 Anemia, unspecified: Secondary | ICD-10-CM | POA: Insufficient documentation

## 2018-05-11 DIAGNOSIS — L97422 Non-pressure chronic ulcer of left heel and midfoot with fat layer exposed: Secondary | ICD-10-CM | POA: Insufficient documentation

## 2018-05-11 DIAGNOSIS — G4733 Obstructive sleep apnea (adult) (pediatric): Secondary | ICD-10-CM | POA: Diagnosis not present

## 2018-05-11 HISTORY — PX: BONE EXOSTOSIS EXCISION: SHX1249

## 2018-05-11 LAB — GLUCOSE, CAPILLARY
Glucose-Capillary: 93 mg/dL (ref 70–99)
Glucose-Capillary: 81 mg/dL (ref 70–99)

## 2018-05-11 SURGERY — EXCISION, EXOSTOSIS
Anesthesia: General | Laterality: Left | Wound class: Contaminated

## 2018-05-11 MED ORDER — PROPOFOL 10 MG/ML IV BOLUS
INTRAVENOUS | Status: DC | PRN
  Administered 2018-05-11: 150 mg via INTRAVENOUS

## 2018-05-11 MED ORDER — ONDANSETRON HCL 4 MG/2ML IJ SOLN: 4.0000 mg | Freq: Four times a day (QID) | INTRAMUSCULAR | Status: DC | PRN

## 2018-05-11 MED ORDER — FENTANYL CITRATE (PF) 100 MCG/2ML IJ SOLN
INTRAMUSCULAR | Status: AC
  Filled 2018-05-11: qty 2

## 2018-05-11 MED ORDER — MIDAZOLAM HCL 2 MG/2ML IJ SOLN
INTRAMUSCULAR | Status: DC | PRN
  Administered 2018-05-11: 2 mg via INTRAVENOUS

## 2018-05-11 MED ORDER — HYDROCODONE-ACETAMINOPHEN 5-325 MG PO TABS: 1.0000 | ORAL_TABLET | Freq: Four times a day (QID) | ORAL | 0 refills | Status: DC | PRN

## 2018-05-11 MED ORDER — LIDOCAINE-EPINEPHRINE 1 %-1:100000 IJ SOLN
INTRAMUSCULAR | Status: DC | PRN
  Administered 2018-05-11: 5 mL

## 2018-05-11 MED ORDER — SODIUM CHLORIDE 0.9 % IV SOLN
Freq: Once | INTRAVENOUS | Status: AC
  Administered 2018-05-11: 07:00:00 via INTRAVENOUS

## 2018-05-11 MED ORDER — MIDAZOLAM HCL 2 MG/2ML IJ SOLN
INTRAMUSCULAR | Status: AC
  Filled 2018-05-11: qty 2

## 2018-05-11 MED ORDER — FENTANYL CITRATE (PF) 100 MCG/2ML IJ SOLN
INTRAMUSCULAR | Status: DC | PRN
  Administered 2018-05-11: 50 ug via INTRAVENOUS

## 2018-05-11 MED ORDER — BUPIVACAINE HCL 0.5 % IJ SOLN
INTRAMUSCULAR | Status: DC | PRN
  Administered 2018-05-11: 5 mL

## 2018-05-11 MED ORDER — LIDOCAINE HCL (CARDIAC) PF 100 MG/5ML IV SOSY
PREFILLED_SYRINGE | INTRAVENOUS | Status: DC | PRN
  Administered 2018-05-11: 40 mg via INTRAVENOUS

## 2018-05-11 MED ORDER — SODIUM CHLORIDE 0.9 % IV SOLN: INTRAVENOUS | Status: DC | PRN

## 2018-05-11 MED ORDER — SODIUM CHLORIDE 0.9 % IV SOLN
INTRAVENOUS | Status: DC | PRN
  Administered 2018-05-11: 07:00:00 via INTRAVENOUS

## 2018-05-11 MED ORDER — POVIDONE-IODINE 7.5 % EX SOLN
Freq: Once | CUTANEOUS | Status: DC
  Filled 2018-05-11: qty 118

## 2018-05-11 MED ORDER — LIDOCAINE-EPINEPHRINE 1 %-1:100000 IJ SOLN
INTRAMUSCULAR | Status: AC
  Filled 2018-05-11: qty 1

## 2018-05-11 MED ORDER — ONDANSETRON HCL 4 MG PO TABS: 4.0000 mg | ORAL_TABLET | Freq: Four times a day (QID) | ORAL | Status: DC | PRN

## 2018-05-11 MED ORDER — BUPIVACAINE HCL (PF) 0.5 % IJ SOLN
INTRAMUSCULAR | Status: AC
  Filled 2018-05-11: qty 30

## 2018-05-11 MED ORDER — CEFAZOLIN SODIUM-DEXTROSE 2-4 GM/100ML-% IV SOLN
INTRAVENOUS | Status: AC
  Filled 2018-05-11: qty 100

## 2018-05-11 MED ORDER — FAMOTIDINE 20 MG PO TABS
20.0000 mg | ORAL_TABLET | Freq: Once | ORAL | Status: AC
  Administered 2018-05-11: 20 mg via ORAL

## 2018-05-11 MED ORDER — LIDOCAINE HCL (PF) 1 % IJ SOLN
INTRAMUSCULAR | Status: AC
  Filled 2018-05-11: qty 30

## 2018-05-11 MED ORDER — FAMOTIDINE 20 MG PO TABS
ORAL_TABLET | ORAL | Status: AC
  Administered 2018-05-11: 20 mg via ORAL
  Filled 2018-05-11: qty 1

## 2018-05-11 MED ORDER — PROPOFOL 10 MG/ML IV BOLUS
INTRAVENOUS | Status: AC
  Filled 2018-05-11: qty 40

## 2018-05-11 SURGICAL SUPPLY — 59 items
BANDAGE ACE 4X5 VEL STRL LF (GAUZE/BANDAGES/DRESSINGS) ×2 IMPLANT
BLADE OSC/SAGITTAL MD 5.5X18 (BLADE) IMPLANT
BLADE OSCILLATING/SAGITTAL (BLADE)
BLADE SW THK.38XMED LNG THN (BLADE) IMPLANT
BNDG COHESIVE 4X5 TAN STRL (GAUZE/BANDAGES/DRESSINGS) ×2 IMPLANT
BNDG COHESIVE 6X5 TAN STRL LF (GAUZE/BANDAGES/DRESSINGS) ×2 IMPLANT
BNDG CONFORM 3 STRL LF (GAUZE/BANDAGES/DRESSINGS) ×2 IMPLANT
BNDG ESMARK 4X12 TAN STRL LF (GAUZE/BANDAGES/DRESSINGS) ×2 IMPLANT
BNDG GAUZE 4.5X4.1 6PLY STRL (MISCELLANEOUS) ×2 IMPLANT
CANISTER SUCT 1200ML W/VALVE (MISCELLANEOUS) ×2 IMPLANT
CANISTER SUCT 3000ML PPV (MISCELLANEOUS) ×2 IMPLANT
CUFF TOURN 18 STER (MISCELLANEOUS) ×2 IMPLANT
CUFF TOURN DUAL PL 12 NO SLV (MISCELLANEOUS) IMPLANT
DRAPE FLUOR MINI C-ARM 54X84 (DRAPES) IMPLANT
DRAPE XRAY CASSETTE 23X24 (DRAPES) ×2 IMPLANT
DRESSING ALLEVYN 4X4 (MISCELLANEOUS) IMPLANT
DURAPREP 26ML APPLICATOR (WOUND CARE) ×2 IMPLANT
ELECT REM PT RETURN 9FT ADLT (ELECTROSURGICAL) ×2
ELECTRODE REM PT RTRN 9FT ADLT (ELECTROSURGICAL) ×1 IMPLANT
GAUZE PACKING 1/4 X5 YD (GAUZE/BANDAGES/DRESSINGS) IMPLANT
GAUZE PACKING IODOFORM 1X5 (MISCELLANEOUS) ×2 IMPLANT
GAUZE PETRO XEROFOAM 1X8 (MISCELLANEOUS) ×2 IMPLANT
GAUZE SPONGE 4X4 12PLY STRL (GAUZE/BANDAGES/DRESSINGS) ×2 IMPLANT
GAUZE STRETCH 2X75IN STRL (MISCELLANEOUS) ×2 IMPLANT
GLOVE BIO SURGEON STRL SZ7.5 (GLOVE) ×2 IMPLANT
GLOVE INDICATOR 8.0 STRL GRN (GLOVE) ×2 IMPLANT
GOWN STRL REUS W/ TWL LRG LVL3 (GOWN DISPOSABLE) ×2 IMPLANT
GOWN STRL REUS W/TWL LRG LVL3 (GOWN DISPOSABLE) ×2
GOWN STRL REUS W/TWL MED LVL3 (GOWN DISPOSABLE) ×4 IMPLANT
HANDPIECE VERSAJET DEBRIDEMENT (MISCELLANEOUS) ×2 IMPLANT
IV NS 1000ML (IV SOLUTION) ×1
IV NS 1000ML BAXH (IV SOLUTION) ×1 IMPLANT
KIT TURNOVER KIT A (KITS) ×2 IMPLANT
LABEL OR SOLS (LABEL) ×2 IMPLANT
NEEDLE FILTER BLUNT 18X 1/2SAF (NEEDLE) ×1
NEEDLE FILTER BLUNT 18X1 1/2 (NEEDLE) ×1 IMPLANT
NEEDLE HYPO 25X1 1.5 SAFETY (NEEDLE) ×2 IMPLANT
NS IRRIG 500ML POUR BTL (IV SOLUTION) ×2 IMPLANT
PACK EXTREMITY ARMC (MISCELLANEOUS) ×2 IMPLANT
PAD ABD DERMACEA PRESS 5X9 (GAUZE/BANDAGES/DRESSINGS) ×2 IMPLANT
PULSAVAC PLUS IRRIG FAN TIP (DISPOSABLE) ×2
RASP SM TEAR CROSS CUT (RASP) ×2 IMPLANT
SHIELD FULL FACE ANTIFOG 7M (MISCELLANEOUS) ×2 IMPLANT
SOL .9 NS 3000ML IRR  AL (IV SOLUTION)
SOL .9 NS 3000ML IRR UROMATIC (IV SOLUTION) IMPLANT
SOL PREP PVP 2OZ (MISCELLANEOUS) ×2
SOLUTION PREP PVP 2OZ (MISCELLANEOUS) ×1 IMPLANT
STOCKINETTE IMPERVIOUS 9X36 MD (GAUZE/BANDAGES/DRESSINGS) ×2 IMPLANT
SUT ETHILON 2 0 FS 18 (SUTURE) ×2 IMPLANT
SUT ETHILON 4-0 (SUTURE)
SUT ETHILON 4-0 FS2 18XMFL BLK (SUTURE)
SUT VIC AB 3-0 SH 27 (SUTURE)
SUT VIC AB 3-0 SH 27X BRD (SUTURE) IMPLANT
SUT VIC AB 4-0 FS2 27 (SUTURE) ×2 IMPLANT
SUTURE ETHLN 4-0 FS2 18XMF BLK (SUTURE) IMPLANT
SWAB CULTURE AMIES ANAERIB BLU (MISCELLANEOUS) IMPLANT
SYR 10ML LL (SYRINGE) ×4 IMPLANT
SYR 3ML LL SCALE MARK (SYRINGE) ×2 IMPLANT
TIP FAN IRRIG PULSAVAC PLUS (DISPOSABLE) ×1 IMPLANT

## 2018-05-11 NOTE — Anesthesia Post-op Follow-up Note (Signed)
Anesthesia QCDR form completed.        

## 2018-05-11 NOTE — OR Nursing (Signed)
Per Doristine Mangolement @ group home via tele 1030 am, he cannot be here for patient instructions and transportation until 12 noon, will come earlier if he can.  Patient aware of same.

## 2018-05-11 NOTE — OR Nursing (Signed)
pkg tape given to group home caretaker (per MD instructions re dressing changes), per caretaker, home health takes care of pt's dressing changes.

## 2018-05-11 NOTE — Discharge Instructions (Addendum)
[facility]  POST OPERATIVE INSTRUCTIONS FOR DR. TROXLER AND [facility]   1. Take your medication as prescribed.  Pain medication should be taken only as needed.  2. Nursing may change bandage 3 times per week as needed.  3. Keep your foot elevated above the heart level for the first 48 hours.  4. We have instructed you to be non-weight bearing.  5. Always wear your post-op shoe when walking.  Always use your crutches if you are to be non-weight bearing.  6. Do not take a shower. Baths are permissible as long as the foot is kept out of the water.   7. Every hour you are awake:  - Bend your knee 15 times. - Flex foot 15 times - Massage calf 15 times  8. Call [facility] [phone]) if any of the following problems occur: - You develop a temperature or fever. - The bandage becomes saturated with blood. - Medication does not stop your pain. - Injury of the foot occurs. - Any symptoms of infection including redness, odor, or red streaks running from wound.    AMBULATORY SURGERY  DISCHARGE INSTRUCTIONS   1) The drugs that you were given will stay in your system until tomorrow so for the next 24 hours you should not:  A) Drive an automobile B) Make any legal decisions C) Drink any alcoholic beverage   2) You may resume regular meals tomorrow.  Today it is better to start with liquids and gradually work up to solid foods.  You may eat anything you prefer, but it is better to start with liquids, then soup and crackers, and gradually work up to solid foods.   3) Please notify your doctor immediately if you have any unusual bleeding, trouble breathing, redness and pain at the surgery site, drainage, fever, or pain not relieved by medication.    4) Additional Instructions:        Please contact your physician with any problems or Same Day Surgery at [phone], Monday through  Friday 6 am to 4 pm, or [place] at [facility] Main number at [phone].

## 2018-05-11 NOTE — Op Note (Signed)
Operative note   Surgeon:Elienai Gailey Armed forces logistics/support/administrative officerowler    Assistant: None    Preop diagnosis: Nonhealing ulcer plantar left foot    Postop diagnosis: Same    Procedure: Excisional debridement of ulcer with excision of bone fifth metatarsal and cuboid.    EBL: Minimal    Anesthesia:local and general.  Local consisted of a one-to-one mixture of 0.5% plain bupivacaine and 1% lidocaine with epinephrine a total of 10 cc was used    Hemostasis: Epinephrine infiltrated along the incision site    Specimen: Soft tissue mass plantar ulceration and bone plantar ulceration    Complications: None    Operative indications:Adrian B [name] is an 57 y.o. that presents today for surgical intervention.  The risks/benefits/alternatives/complications have been discussed and consent has been given.    Procedure:  Patient was brought into the OR and placed on the operating table in thesupine position. After anesthesia was obtained theleft lower extremity was prepped and draped in usual sterile fashion.  Attention was directed to the plantar aspect of the left foot with a noted ulceration was found.  A longitudinal incision was made across the ulcerative site.  Full-thickness incision was taken down to bone.  In the central aspect of the ulceration was noted thick scar tissue.  This was excised in toto and sent for pathological examination.  Further inspection of the plantar aspect of the ulcerative site revealed a loose bone from the cuboid region.  This was excised in toto and sent for pathological examination.  Also noted on the fifth metatarsal base was an exostosis.  This was removed with a rondure and smoothed with a power rasp.  Fluoroscopy revealed good removal of bone to all areas.  The deep ulcerative site was then debrided excisionally to bone with a versa jet.  The wound was then flushed with copious amounts of irrigation.  Closure was accomplished with a 2-0 nylon.  The central aspect of the wound was packed  with iodoform impregnated gauze dressing.  A bulky sterile dry bandage was applied.  She has been instructed to remain nonweightbearing postoperatively.  Prescription for Norco was given.    Patient tolerated the procedure and anesthesia well.  Was transported from the OR to the PACU with all vital signs stable and vascular status intact. To be discharged per routine protocol.  Will follow up in approximately 1 week in the outpatient clinic.

## 2018-05-11 NOTE — H&P (Signed)
HISTORY AND PHYSICAL INTERVAL NOTE:  05/11/2018  7:28 AM  Adrian Diegoarlton B Pokorny Jr.  has presented today for surgery, with the diagnosis of Diabetic ulcer of left midfoot assocated with type 2 diabetes w/fat layer exposed.  The various methods of treatment have been discussed with the patient.  No guarantees were given.  After consideration of risks, benefits and other options for treatment, the patient has consented to surgery.  I have reviewed the patients' chart and labs.    Lungs CTA CV:  RRR  Patient Vitals for the past 24 hrs:  BP Temp Temp src Pulse Resp SpO2 Height Weight  05/11/18 0649 136/82 (!) 97.5 F (36.4 C) Tympanic (!) 57 14 100 % 5\' 11"  (1.803 m) 109.3 kg (241 lb)    [name] history and physical examination was performed in my office.  The patient was reexamined.  There have been no changes to this history and physical examination.  Adrian [name], Adrian [name]

## 2018-05-11 NOTE — Transfer of Care (Signed)
Immediate Anesthesia Transfer of Care Note  Patient: Adrian DiegoCarlton B Bracey Jr.  Procedure(s) Performed: Excisional Debridment ulcer left foot (Left )  Patient Location: PACU  Anesthesia Type:General  Level of Consciousness: awake, alert  and oriented  Airway & Oxygen Therapy: Patient Spontanous Breathing and Patient connected to face mask oxygen  Post-op Assessment: Report given to RN and Post -op Vital signs reviewed and stable  Post vital signs: Reviewed and stable  Last Vitals:  Vitals Value Taken Time  BP 121/91 05/11/2018  8:53 AM  Temp 36.5 C 05/11/2018  8:53 AM  Pulse 65 05/11/2018  8:56 AM  Resp 11 05/11/2018  8:57 AM  SpO2 100 % 05/11/2018  8:56 AM  Vitals shown include unvalidated device data.  Last Pain:  Vitals:   05/11/18 0853  TempSrc:   PainSc: 0-No pain         Complications: No apparent anesthesia complications

## 2018-05-11 NOTE — Anesthesia Preprocedure Evaluation (Signed)
Anesthesia Evaluation  Patient identified by MRN, date of birth, ID band Patient awake    Reviewed: Allergy & Precautions, NPO status , Patient's Chart, lab work & pertinent test results  History of Anesthesia Complications Negative for: history of anesthetic complications  Airway Mallampati: II  TM Distance: >3 FB Neck ROM: Full    Dental  (+) Missing, Poor Dentition   Pulmonary asthma , sleep apnea , former smoker,    breath sounds clear to auscultation- rhonchi (-) wheezing      Cardiovascular hypertension, Pt. on medications (-) CAD, (-) Past MI, (-) Cardiac Stents and (-) CABG  Rhythm:Regular Rate:Normal - Systolic murmurs and - Diastolic murmurs    Neuro/Psych PSYCHIATRIC DISORDERS Depression Bipolar Disorder CVA (L sided weakness), Residual Symptoms    GI/Hepatic negative GI ROS, Neg liver ROS,   Endo/Other  diabetesHypothyroidism   Renal/GU negative Renal ROS     Musculoskeletal  (+) Arthritis ,   Abdominal (+) + obese,   Peds  Hematology  (+) anemia ,   Anesthesia Other Findings Past Medical History: No date: Anemia No date: Asthma No date: Bipolar disorder (HCC) No date: Charcot foot due to diabetes mellitus (HCC) No date: Depression 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 No date: Diabetes mellitus without complication (HCC) No date: Eczema No date: Hyperlipemia No date: Hypertension No date: Hypothyroidism No date: Neuropathy No date: Psoriasis No date: Sleep apnea, obstructive No date: Stroke [facility](HCC)     Comment:  left hand weakness   Reproductive/Obstetrics                             Anesthesia Physical Anesthesia Plan  ASA: III  Anesthesia Plan: General   Post-op Pain Management:    Induction: Intravenous  PONV Risk Score and Plan: 1 and Ondansetron and Midazolam  Airway Management Planned: LMA  Additional Equipment:   Intra-op Plan:   Post-operative Plan:    Informed Consent: I have reviewed the patients History and Physical, chart, labs and discussed the procedure including the risks, benefits and alternatives for the proposed anesthesia with the patient or authorized representative who has indicated his/her understanding and acceptance.   Dental advisory given  Plan Discussed with: CRNA and Anesthesiologist  Anesthesia Plan Comments:         Anesthesia Quick Evaluation

## 2018-05-11 NOTE — Anesthesia Procedure Notes (Signed)
Procedure Name: LMA Insertion Date/Time: 05/11/2018 7:38 AM Performed by: Manning CharityEvans, Madisson Kulaga B, CRNA Pre-anesthesia Checklist: Patient identified, Emergency Drugs available, Suction available, Patient being monitored and Timeout performed Patient Re-evaluated:Patient Re-evaluated prior to induction Oxygen Delivery Method: Circle system utilized Preoxygenation: Pre-oxygenation with 100% oxygen Induction Type: IV induction Ventilation: Mask ventilation without difficulty LMA Size: 5.0 Tube secured with: Tape Dental Injury: Teeth and Oropharynx as per pre-operative assessment

## 2018-05-11 NOTE — Anesthesia Postprocedure Evaluation (Signed)
Anesthesia Post Note  Patient: Adrian DiegoCarlton B Kallam Jr.  Procedure(s) Performed: Excisional Debridment ulcer left foot (Left )  Patient location during evaluation: PACU Anesthesia Type: General Level of consciousness: awake and alert and oriented Pain management: pain level controlled Vital Signs Assessment: post-procedure vital signs reviewed and stable Respiratory status: spontaneous breathing, nonlabored ventilation and respiratory function stable Cardiovascular status: blood pressure returned to baseline and stable Postop Assessment: no signs of nausea or vomiting Anesthetic complications: no     Last Vitals:  Vitals:   05/11/18 1026 05/11/18 1144  BP: 130/72 (!) 156/77  Pulse: (!) 55 65  Resp: 14 16  Temp: (!) 36.4 C   SpO2: 100% 100%    Last Pain:  Vitals:   05/11/18 1144  TempSrc:   PainSc: 0-No pain                 Deshanae Lindo

## 2018-05-13 ENCOUNTER — Encounter: Payer: Self-pay | Admitting: Podiatry

## 2018-05-15 LAB — SURGICAL PATHOLOGY

## 2018-08-20 ENCOUNTER — Telehealth: Payer: Self-pay | Admitting: Podiatry

## 2018-08-20 NOTE — Telephone Encounter (Signed)
This is Adrian [name], Charity fundraiser with [facility]. I've been seeing [name] twice a week for wound care to his left foot. Today he is complaining about ringing in his right ear. If you could call me back at [phone]

## 2018-09-07 ENCOUNTER — Other Ambulatory Visit: Payer: Self-pay | Admitting: Podiatry

## 2018-09-19 ENCOUNTER — Encounter
Admission: RE | Admit: 2018-09-19 | Discharge: 2018-09-19 | Disposition: A | Discharge status: Home / Self Care | Payer: Medicare Other | Source: Ambulatory Visit | Attending: Podiatry | Admitting: Podiatry

## 2018-09-19 ENCOUNTER — Other Ambulatory Visit: Payer: Self-pay

## 2018-09-19 DIAGNOSIS — I10 Essential (primary) hypertension: Secondary | ICD-10-CM | POA: Diagnosis not present

## 2018-09-19 DIAGNOSIS — L97529 Non-pressure chronic ulcer of other part of left foot with unspecified severity: Secondary | ICD-10-CM | POA: Diagnosis not present

## 2018-09-19 DIAGNOSIS — Z01812 Encounter for preprocedural laboratory examination: Secondary | ICD-10-CM | POA: Insufficient documentation

## 2018-09-19 DIAGNOSIS — E785 Hyperlipidemia, unspecified: Secondary | ICD-10-CM | POA: Diagnosis not present

## 2018-09-19 DIAGNOSIS — E11621 Type 2 diabetes mellitus with foot ulcer: Secondary | ICD-10-CM | POA: Diagnosis not present

## 2018-09-19 DIAGNOSIS — F319 Bipolar disorder, unspecified: Secondary | ICD-10-CM | POA: Diagnosis not present

## 2018-09-19 DIAGNOSIS — Z87891 Personal history of nicotine dependence: Secondary | ICD-10-CM | POA: Diagnosis not present

## 2018-09-19 DIAGNOSIS — Z79899 Other long term (current) drug therapy: Secondary | ICD-10-CM | POA: Diagnosis not present

## 2018-09-19 DIAGNOSIS — E11622 Type 2 diabetes mellitus with other skin ulcer: Secondary | ICD-10-CM | POA: Diagnosis present

## 2018-09-19 DIAGNOSIS — E039 Hypothyroidism, unspecified: Secondary | ICD-10-CM | POA: Diagnosis not present

## 2018-09-19 DIAGNOSIS — M899 Disorder of bone, unspecified: Secondary | ICD-10-CM | POA: Diagnosis not present

## 2018-09-19 DIAGNOSIS — J45909 Unspecified asthma, uncomplicated: Secondary | ICD-10-CM | POA: Diagnosis not present

## 2018-09-19 HISTORY — DX: Unspecified psychosis not due to a substance or known physiological condition: F29

## 2018-09-19 HISTORY — DX: Respiratory failure, unspecified, unspecified whether with hypoxia or hypercapnia: J96.90

## 2018-09-19 LAB — BASIC METABOLIC PANEL
Anion gap: 8 (ref 5–15)
BUN: 16 mg/dL (ref 6–20)
CHLORIDE: 106 mmol/L (ref 98–111)
CO2: 28 mmol/L (ref 22–32)
Calcium: 9 mg/dL (ref 8.9–10.3)
Creatinine, Ser: 0.82 mg/dL (ref 0.61–1.24)
GFR calc Af Amer: >60 mL/min (ref >60)
GLUCOSE: 99 mg/dL (ref 70–99)
POTASSIUM: 4.5 mmol/L (ref 3.5–5.1)
Sodium: 142 mmol/L (ref 135–145)

## 2018-09-19 LAB — CBC
HCT: 36.8 % — ABNORMAL LOW (ref 39.0–52.0)
Hemoglobin: 12.3 g/dL — ABNORMAL LOW (ref 13.0–17.0)
MCH: 30.7 pg (ref 26.0–34.0)
MCHC: 33.4 g/dL (ref 30.0–36.0)
MCV: 91.8 fL (ref 80.0–100.0)
PLATELETS: 372 10*3/uL (ref 150–400)
RBC: 4.01 MIL/uL — ABNORMAL LOW (ref 4.22–5.81)
RDW: 13 % (ref 11.5–15.5)
WBC: 8.9 10*3/uL (ref 4.0–10.5)
nRBC: 0 % (ref 0.0–0.2)

## 2018-09-19 NOTE — Patient Instructions (Signed)
Your procedure is scheduled on: 09/21/18 Fri Report to Same Day Surgery 2nd floor medical mall [facility] Entrance-take elevator on left to 2nd floor.  Check in with surgery information desk.) To find out your arrival time please call [phone] between 1PM - 3PM on 09/20/18 Thurs  Remember: Instructions that are not followed completely may result in serious medical risk, up to and including death, or upon the discretion of your surgeon and anesthesiologist your surgery may need to be rescheduled.    _x___ 1. Do not eat food after midnight the night before your procedure. You may drink clear liquids up to 2 hours before you are scheduled to arrive at the hospital for your procedure.  Do not drink clear liquids within 2 hours of your scheduled arrival to the hospital.  Clear liquids include  --Water or Apple juice without pulp  --Clear carbohydrate beverage such as ClearFast or Gatorade  --Black Coffee or Clear Tea (No milk, no creamers, do not add anything to                  the coffee or Tea Type 1 and type 2 diabetics should only drink water.   ____Ensure clear carbohydrate drink on the way to the hospital for bariatric patients  ____Ensure clear carbohydrate drink 3 hours before surgery for Dr Rutherford Nail patients if physician instructed.   No gum chewing or hard candies.     __x__ 2. No Alcohol for 24 hours before or after surgery.   __x__3. No Smoking or e-cigarettes for 24 prior to surgery.  Do not use any chewable tobacco products for at least 6 hour prior to surgery   ____  4. Bring all medications with you on the day of surgery if instructed.    __x__ 5. Notify your doctor if there is any change in your medical condition     (cold, fever, infections).    x___6. On the morning of surgery brush your teeth with toothpaste and water.  You may rinse your mouth with mouth wash if you wish.  Do not swallow any toothpaste or mouthwash.   Do not wear jewelry, make-up, hairpins,  clips or nail polish.  Do not wear lotions, powders, or perfumes. You may wear deodorant.  Do not shave 48 hours prior to surgery. Men may shave face and neck.  Do not bring valuables to the hospital.    [facility] is not responsible for any belongings or valuables.               Contacts, dentures or bridgework may not be worn into surgery.  Leave your suitcase in the car. After surgery it may be brought to your room.  For patients admitted to the hospital, discharge time is determined by your                       treatment team.  _  Patients discharged the day of surgery will not be allowed to drive home.  You will need someone to drive you home and stay with you the night of your procedure.    Please read over the following fact sheets that you were given:   [facility] Preparing for Surgery and or MRSA Information   _x___ Take anti-hypertensive listed below, cardiac, seizure, asthma,     anti-reflux and psychiatric medicines. These include:  1. albuterol (PROVENTIL HFA;VENTOLIN HFA) 108 (90 Base) MCG/ACT inhaler  2.fluticasone furoate-vilanterol (BREO ELLIPTA) 100-25 MCG/INH AEPB  3.metoprolol (  LOPRESSOR) 100 MG tablet  4.montelukast (SINGULAIR) 10 MG tablet  5.oxcarbazepine (TRILEPTAL) 600 MG tablet  6.pantoprazole (PROTONIX) 40 MG tablet  7 ziprasidone (GEODON) 40 MG capsule  ____Fleets enema or Magnesium Citrate as directed.   _x___ Use CHG Soap or sage wipes as directed on instruction sheet   _x___ Use inhalers on the day of surgery and bring to hospital day of surgery  ____ Stop Metformin and Janumet 2 days prior to surgery.    ____ Take 1/2 of usual insulin dose the night before surgery and none on the morning     surgery.   _x___ Follow recommendations from Cardiologist, Pulmonologist or PCP regarding          stopping Aspirin, Coumadin, Plavix ,Eliquis, Effient, or Pradaxa, and Pletal.  X____Stop Anti-inflammatories such as Advil, Aleve, Ibuprofen, Motrin,  Naproxen, Naprosyn, Goodies powders or aspirin products. OK to take Tylenol and                          Celebrex.   _x___ Stop supplements until after surgery.  But may continue Vitamin D, Vitamin B,       and multivitamin.   _x___ Bring C-Pap to the hospital.

## 2018-09-20 MED ORDER — CEFAZOLIN SODIUM-DEXTROSE 2-4 GM/100ML-% IV SOLN
2.0000 g | INTRAVENOUS | Status: AC
  Administered 2018-09-21: 2 g via INTRAVENOUS

## 2018-09-21 ENCOUNTER — Encounter: Payer: Self-pay | Admitting: *Deleted

## 2018-09-21 ENCOUNTER — Ambulatory Visit: Payer: Medicare Other | Admitting: Anesthesiology

## 2018-09-21 ENCOUNTER — Other Ambulatory Visit: Payer: Self-pay

## 2018-09-21 ENCOUNTER — Encounter
Admission: RE | Disposition: A | Discharge status: Home / Self Care | Payer: Self-pay | Source: Ambulatory Visit | Attending: Podiatry

## 2018-09-21 ENCOUNTER — Ambulatory Visit
Admission: RE | Admit: 2018-09-21 | Discharge: 2018-09-21 | Disposition: A | Discharge status: Home / Self Care | Payer: Medicare Other | Source: Ambulatory Visit | Attending: Podiatry | Admitting: Podiatry

## 2018-09-21 DIAGNOSIS — Z87891 Personal history of nicotine dependence: Secondary | ICD-10-CM | POA: Insufficient documentation

## 2018-09-21 DIAGNOSIS — E11621 Type 2 diabetes mellitus with foot ulcer: Secondary | ICD-10-CM | POA: Diagnosis not present

## 2018-09-21 DIAGNOSIS — I10 Essential (primary) hypertension: Secondary | ICD-10-CM | POA: Insufficient documentation

## 2018-09-21 DIAGNOSIS — E039 Hypothyroidism, unspecified: Secondary | ICD-10-CM | POA: Insufficient documentation

## 2018-09-21 DIAGNOSIS — Z79899 Other long term (current) drug therapy: Secondary | ICD-10-CM | POA: Insufficient documentation

## 2018-09-21 DIAGNOSIS — F319 Bipolar disorder, unspecified: Secondary | ICD-10-CM | POA: Insufficient documentation

## 2018-09-21 DIAGNOSIS — E785 Hyperlipidemia, unspecified: Secondary | ICD-10-CM | POA: Insufficient documentation

## 2018-09-21 DIAGNOSIS — L97529 Non-pressure chronic ulcer of other part of left foot with unspecified severity: Secondary | ICD-10-CM | POA: Insufficient documentation

## 2018-09-21 DIAGNOSIS — M899 Disorder of bone, unspecified: Secondary | ICD-10-CM | POA: Insufficient documentation

## 2018-09-21 DIAGNOSIS — J45909 Unspecified asthma, uncomplicated: Secondary | ICD-10-CM | POA: Insufficient documentation

## 2018-09-21 HISTORY — PX: BONE EXCISION: SHX6730

## 2018-09-21 LAB — GLUCOSE, CAPILLARY
Glucose-Capillary: 70 mg/dL (ref 70–99)
Glucose-Capillary: 96 mg/dL (ref 70–99)

## 2018-09-21 SURGERY — BONE EXCISION
Anesthesia: General | Site: Foot | Laterality: Left

## 2018-09-21 MED ORDER — ONDANSETRON HCL 4 MG PO TABS: 4.0000 mg | ORAL_TABLET | Freq: Four times a day (QID) | ORAL | Status: DC | PRN

## 2018-09-21 MED ORDER — POVIDONE-IODINE 7.5 % EX SOLN
Freq: Once | CUTANEOUS | Status: AC
  Administered 2018-09-21: 13:00:00 via TOPICAL
  Filled 2018-09-21: qty 118

## 2018-09-21 MED ORDER — FENTANYL CITRATE (PF) 100 MCG/2ML IJ SOLN: 25.0000 ug | INTRAMUSCULAR | Status: DC | PRN

## 2018-09-21 MED ORDER — FENTANYL CITRATE (PF) 100 MCG/2ML IJ SOLN
INTRAMUSCULAR | Status: AC
  Filled 2018-09-21: qty 2

## 2018-09-21 MED ORDER — MIDAZOLAM HCL 2 MG/2ML IJ SOLN
INTRAMUSCULAR | Status: AC
  Filled 2018-09-21: qty 2

## 2018-09-21 MED ORDER — LIDOCAINE HCL (CARDIAC) PF 100 MG/5ML IV SOSY
PREFILLED_SYRINGE | INTRAVENOUS | Status: DC | PRN
  Administered 2018-09-21: 100 mg via INTRAVENOUS

## 2018-09-21 MED ORDER — ONDANSETRON HCL 4 MG/2ML IJ SOLN: 4.0000 mg | Freq: Once | INTRAMUSCULAR | Status: DC | PRN

## 2018-09-21 MED ORDER — PROPOFOL 10 MG/ML IV BOLUS
INTRAVENOUS | Status: DC | PRN
  Administered 2018-09-21: 150 mg via INTRAVENOUS
  Administered 2018-09-21: 50 mg via INTRAVENOUS

## 2018-09-21 MED ORDER — LIDOCAINE HCL (PF) 2 % IJ SOLN
INTRAMUSCULAR | Status: AC
  Filled 2018-09-21: qty 10

## 2018-09-21 MED ORDER — MIDAZOLAM HCL 2 MG/2ML IJ SOLN
INTRAMUSCULAR | Status: DC | PRN
  Administered 2018-09-21: 1 mg via INTRAVENOUS

## 2018-09-21 MED ORDER — PROPOFOL 10 MG/ML IV BOLUS
INTRAVENOUS | Status: AC
  Filled 2018-09-21: qty 40

## 2018-09-21 MED ORDER — CEFAZOLIN SODIUM-DEXTROSE 2-4 GM/100ML-% IV SOLN
INTRAVENOUS | Status: AC
  Filled 2018-09-21: qty 100

## 2018-09-21 MED ORDER — DEXAMETHASONE SODIUM PHOSPHATE 10 MG/ML IJ SOLN
INTRAMUSCULAR | Status: AC
  Filled 2018-09-21: qty 1

## 2018-09-21 MED ORDER — ONDANSETRON HCL 4 MG/2ML IJ SOLN: 4.0000 mg | Freq: Four times a day (QID) | INTRAMUSCULAR | Status: DC | PRN

## 2018-09-21 MED ORDER — OXYCODONE HCL 5 MG PO TABS: 5.0000 mg | ORAL_TABLET | ORAL | 0 refills | Status: DC | PRN

## 2018-09-21 MED ORDER — BUPIVACAINE-EPINEPHRINE (PF) 0.25% -1:200000 IJ SOLN
INTRAMUSCULAR | Status: DC | PRN
  Administered 2018-09-21: 10 mL via PERINEURAL

## 2018-09-21 MED ORDER — DEXAMETHASONE SODIUM PHOSPHATE 10 MG/ML IJ SOLN
INTRAMUSCULAR | Status: DC | PRN
  Administered 2018-09-21: 10 mg via INTRAVENOUS

## 2018-09-21 MED ORDER — PHENYLEPHRINE HCL 10 MG/ML IJ SOLN
INTRAMUSCULAR | Status: DC | PRN
  Administered 2018-09-21 (×2): 100 ug via INTRAVENOUS
  Administered 2018-09-21: 50 ug via INTRAVENOUS
  Administered 2018-09-21: 150 ug via INTRAVENOUS

## 2018-09-21 MED ORDER — SODIUM CHLORIDE 0.9 % IV SOLN
INTRAVENOUS | Status: DC
  Administered 2018-09-21: 13:00:00 via INTRAVENOUS

## 2018-09-21 MED ORDER — EPHEDRINE SULFATE 50 MG/ML IJ SOLN
INTRAMUSCULAR | Status: AC
  Filled 2018-09-21: qty 1

## 2018-09-21 MED ORDER — FENTANYL CITRATE (PF) 100 MCG/2ML IJ SOLN
INTRAMUSCULAR | Status: DC | PRN
  Administered 2018-09-21 (×2): 25 ug via INTRAVENOUS

## 2018-09-21 MED ORDER — ONDANSETRON HCL 4 MG/2ML IJ SOLN
INTRAMUSCULAR | Status: DC | PRN
  Administered 2018-09-21: 4 mg via INTRAVENOUS

## 2018-09-21 MED ORDER — ONDANSETRON HCL 4 MG/2ML IJ SOLN
INTRAMUSCULAR | Status: AC
  Filled 2018-09-21: qty 2

## 2018-09-21 MED ORDER — EPHEDRINE SULFATE 50 MG/ML IJ SOLN
INTRAMUSCULAR | Status: DC | PRN
  Administered 2018-09-21 (×6): 5 mg via INTRAVENOUS

## 2018-09-21 SURGICAL SUPPLY — 63 items
BANDAGE ACE 4X5 VEL STRL LF (GAUZE/BANDAGES/DRESSINGS) ×2 IMPLANT
BLADE OSC/SAGITTAL 5.5X25 (BLADE) ×2 IMPLANT
BLADE OSC/SAGITTAL MD 5.5X18 (BLADE) IMPLANT
BLADE OSCILLATING/SAGITTAL (BLADE)
BLADE SW THK.38XMED LNG THN (BLADE) IMPLANT
BNDG COHESIVE 4X5 TAN STRL (GAUZE/BANDAGES/DRESSINGS) ×2 IMPLANT
BNDG COHESIVE 6X5 TAN STRL LF (GAUZE/BANDAGES/DRESSINGS) ×2 IMPLANT
BNDG CONFORM 2 STRL LF (GAUZE/BANDAGES/DRESSINGS) ×2 IMPLANT
BNDG CONFORM 3 STRL LF (GAUZE/BANDAGES/DRESSINGS) ×2 IMPLANT
BNDG ESMARK 4X12 TAN STRL LF (GAUZE/BANDAGES/DRESSINGS) ×2 IMPLANT
BNDG GAUZE 4.5X4.1 6PLY STRL (MISCELLANEOUS) ×2 IMPLANT
CANISTER SUCT 1200ML W/VALVE (MISCELLANEOUS) ×2 IMPLANT
CANISTER SUCT 3000ML PPV (MISCELLANEOUS) ×2 IMPLANT
COVER WAND RF STERILE (DRAPES) ×2 IMPLANT
CUFF TOURN 18 STER (MISCELLANEOUS) ×2 IMPLANT
CUFF TOURN 24 STER (MISCELLANEOUS) ×2 IMPLANT
CUFF TOURN DUAL PL 12 NO SLV (MISCELLANEOUS) ×2 IMPLANT
DRAPE FLUOR MINI C-ARM 54X84 (DRAPES) IMPLANT
DRAPE XRAY CASSETTE 23X24 (DRAPES) IMPLANT
DRESSING ALLEVYN 4X4 (MISCELLANEOUS) IMPLANT
DURAPREP 26ML APPLICATOR (WOUND CARE) ×2 IMPLANT
ELECT REM PT RETURN 9FT ADLT (ELECTROSURGICAL) ×2
ELECTRODE REM PT RTRN 9FT ADLT (ELECTROSURGICAL) ×1 IMPLANT
GAUZE PACKING 1/4 X5 YD (GAUZE/BANDAGES/DRESSINGS) ×2 IMPLANT
GAUZE PACKING IODOFORM 1X5 (MISCELLANEOUS) ×2 IMPLANT
GAUZE PETRO XEROFOAM 1X8 (MISCELLANEOUS) ×2 IMPLANT
GAUZE SPONGE 4X4 12PLY STRL (GAUZE/BANDAGES/DRESSINGS) ×2 IMPLANT
GAUZE XEROFORM 4X4 STRL (GAUZE/BANDAGES/DRESSINGS) ×2 IMPLANT
GLOVE BIO SURGEON STRL SZ7.5 (GLOVE) ×2 IMPLANT
GLOVE INDICATOR 8.0 STRL GRN (GLOVE) ×2 IMPLANT
GOWN STRL REUS W/ TWL LRG LVL3 (GOWN DISPOSABLE) ×2 IMPLANT
GOWN STRL REUS W/TWL LRG LVL3 (GOWN DISPOSABLE) ×2
GOWN STRL REUS W/TWL MED LVL3 (GOWN DISPOSABLE) ×4 IMPLANT
HANDPIECE VERSAJET DEBRIDEMENT (MISCELLANEOUS) IMPLANT
IV NS 1000ML (IV SOLUTION) ×1
IV NS 1000ML BAXH (IV SOLUTION) ×1 IMPLANT
KIT TURNOVER KIT A (KITS) ×2 IMPLANT
LABEL OR SOLS (LABEL) ×2 IMPLANT
NEEDLE FILTER BLUNT 18X 1/2SAF (NEEDLE) ×1
NEEDLE FILTER BLUNT 18X1 1/2 (NEEDLE) ×1 IMPLANT
NEEDLE HYPO 25X1 1.5 SAFETY (NEEDLE) ×2 IMPLANT
NS IRRIG 500ML POUR BTL (IV SOLUTION) ×2 IMPLANT
PACK EXTREMITY ARMC (MISCELLANEOUS) ×2 IMPLANT
PAD ABD DERMACEA PRESS 5X9 (GAUZE/BANDAGES/DRESSINGS) ×4 IMPLANT
PULSAVAC PLUS IRRIG FAN TIP (DISPOSABLE) ×2
RASP SM TEAR CROSS CUT (RASP) ×2 IMPLANT
SHIELD FULL FACE ANTIFOG 7M (MISCELLANEOUS) ×2 IMPLANT
SOL .9 NS 3000ML IRR  AL (IV SOLUTION) ×1
SOL .9 NS 3000ML IRR UROMATIC (IV SOLUTION) ×1 IMPLANT
SOL PREP PVP 2OZ (MISCELLANEOUS) ×2
SOLUTION PREP PVP 2OZ (MISCELLANEOUS) ×1 IMPLANT
STOCKINETTE IMPERVIOUS 9X36 MD (GAUZE/BANDAGES/DRESSINGS) ×2 IMPLANT
SUT ETHILON 2 0 FS 18 (SUTURE) ×6 IMPLANT
SUT ETHILON 4-0 (SUTURE) ×1
SUT ETHILON 4-0 FS2 18XMFL BLK (SUTURE) ×1
SUT VIC AB 3-0 SH 27 (SUTURE) ×2
SUT VIC AB 3-0 SH 27X BRD (SUTURE) ×2 IMPLANT
SUT VIC AB 4-0 FS2 27 (SUTURE) ×2 IMPLANT
SUTURE ETHLN 4-0 FS2 18XMF BLK (SUTURE) ×1 IMPLANT
SWAB CULTURE AMIES ANAERIB BLU (MISCELLANEOUS) IMPLANT
SYR 10ML LL (SYRINGE) ×4 IMPLANT
SYR 3ML LL SCALE MARK (SYRINGE) ×2 IMPLANT
TIP FAN IRRIG PULSAVAC PLUS (DISPOSABLE) ×1 IMPLANT

## 2018-09-21 NOTE — Discharge Instructions (Signed)
[facility]  POST OPERATIVE INSTRUCTIONS FOR DR. TROXLER AND [facility]   1. Take your medication as prescribed.  Pain medication should be taken only as needed.  2. Keep the dressing clean, dry and intact.  Ok to have nursing change your dressing as needed or 3 times per week.  3. Keep your foot elevated above the heart level for the first 48 hours.  4. Walking to the bathroom and brief periods of walking are acceptable, unless we have instructed you to be non-weight bearing.  5. Always wear your post-op shoe when walking.  Always use your crutches if you are to be non-weight bearing.  6. Do not take a shower. Baths are permissible as long as the foot is kept out of the water.   7. Every hour you are awake:  - Bend your knee 15 times. - Flex foot 15 times - Massage calf 15 times  8. Call [facility] [phone]) if any of the following problems occur: - You develop a temperature or fever. - The bandage becomes saturated with blood. - Medication does not stop your pain. - Injury of the foot occurs. - Any symptoms of infection including redness, odor, or red streaks running from wound.  AMBULATORY SURGERY  DISCHARGE INSTRUCTIONS   1) The drugs that you were given will stay in your system until tomorrow so for the next 24 hours you should not:  A) Drive an automobile B) Make any legal decisions C) Drink any alcoholic beverage   2) You may resume regular meals tomorrow.  Today it is better to start with liquids and gradually work up to solid foods.  You may eat anything you prefer, but it is better to start with liquids, then soup and crackers, and gradually work up to solid foods.   3) Please notify your doctor immediately if you have any unusual bleeding, trouble breathing, redness and pain at the surgery site, drainage, fever, or pain not relieved by medication.    4) Additional  Instructions:        Please contact your physician with any problems or Same Day Surgery at [phone], Monday through Friday 6 am to 4 pm, or [place] at [facility] Main number at [phone].

## 2018-09-21 NOTE — H&P (Signed)
HISTORY AND PHYSICAL INTERVAL NOTE:  09/21/2018  1:58 PM  Adrian Diegoarlton B Rossman Jr.  has presented today for surgery, with the diagnosis of Diabetic Ulcer.  The various methods of treatment have been discussed with the patient.  No guarantees were given.  After consideration of risks, benefits and other options for treatment, the patient has consented to surgery.  I have reviewed the patients' chart and labs.     A history and physical examination was performed in my office.  The patient was reexamined.  There have been no changes to this history and physical examination.  [name], Tameem Pullara A

## 2018-09-21 NOTE — Op Note (Signed)
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  2.  Use of intraoperative fluoroscopy    EBL: 25 mL's    Anesthesia:local and general    Hemostasis: Epinephrine infiltrated along the incision site    Specimen: Bone for culture as well as bone for pathology    Complications: None    Operative indications:Adrian B [name] is an 57 y.o. that presents today for surgical intervention.  The risks/benefits/alternatives/complications have been discussed and consent has been given.    Procedure:  Patient was brought into the OR and placed on the operating table in thesupine position. After anesthesia was obtained theleft lower extremity was prepped and draped in usual sterile fashion.  Attention was directed to the plantar aspect left foot where a prominent open ulceration was noted.  Intraoperative fluoroscopy was used to demonstrate the prominent exostosis.  Incision was made proximal distal to the midfoot ulceration which was basically directly plantar to the fourth metatarsal region.  Full-thickness incision was taken down to the deep tissue.  At this time there was noted a deep excess ptosis at about the level of the fourth metatarsal base.  This time power saw was used to remove the prominence in the area.  The bone was then removed.  The bone itself looked to be stable but this was sent for both pathological examination as well as culture.  Wound was then flushed with copious amounts of irrigation.  Fluoroscopy evaluation revealed good removal of the exostosis.  At this time the wound was flushed finally and closed with a combination of 3-0 Vicryl and a 2-0 nylon.  A large bulky sterile dressing was applied.  2 portions of the incision were packed with iodoform packing.    Patient tolerated the procedure and anesthesia well.  Was  transported from the OR to the PACU with all vital signs stable and vascular status intact. To be discharged per routine protocol.  Will follow up in approximately 1 week in the outpatient clinic.

## 2018-09-21 NOTE — Anesthesia Preprocedure Evaluation (Signed)
Anesthesia Evaluation  Patient identified by MRN, date of birth, ID band Patient awake    Reviewed: Allergy & Precautions, NPO status , Patient's Chart, lab work & pertinent test results  History of Anesthesia Complications Negative for: history of anesthetic complications  Airway Mallampati: II  TM Distance: >3 FB Neck ROM: Full    Dental  (+) Missing, Poor Dentition, Dental Advidsory Given   Pulmonary neg shortness of breath, asthma , sleep apnea , neg recent URI, former smoker,    breath sounds clear to auscultation- rhonchi (-) wheezing      Cardiovascular hypertension, Pt. on medications (-) angina(-) CAD, (-) Past MI, (-) Cardiac Stents and (-) CABG  Rhythm:Regular Rate:Normal - Systolic murmurs and - Diastolic murmurs    Neuro/Psych PSYCHIATRIC DISORDERS Depression Bipolar Disorder CVA (L sided weakness), Residual Symptoms    GI/Hepatic negative GI ROS, Neg liver ROS,   Endo/Other  diabetesHypothyroidism   Renal/GU negative Renal ROS     Musculoskeletal  (+) Arthritis ,   Abdominal (+) + obese,   Peds  Hematology  (+) Blood dyscrasia, anemia ,   Anesthesia Other Findings Past Medical History: No date: Anemia No date: Asthma No date: Bipolar disorder (HCC) No date: Charcot foot due to diabetes mellitus (HCC) No date: Depression 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 No date: Diabetes mellitus without complication (HCC) No date: Eczema No date: Hyperlipemia No date: Hypertension No date: Hypothyroidism No date: Neuropathy No date: Psoriasis No date: Sleep apnea, obstructive No date: Stroke [facility](HCC)     Comment:  left hand weakness   Reproductive/Obstetrics                             Anesthesia Physical  Anesthesia Plan  ASA: III  Anesthesia Plan: General   Post-op Pain Management:    Induction: Intravenous  PONV Risk Score and Plan: 1 and Ondansetron, Midazolam,  Dexamethasone and Treatment may vary due to age or medical condition  Airway Management Planned: LMA  Additional Equipment:   Intra-op Plan:   Post-operative Plan: Extubation in OR  Informed Consent: I have reviewed the patients History and Physical, chart, labs and discussed the procedure including the risks, benefits and alternatives for the proposed anesthesia with the patient or authorized representative who has indicated his/her understanding and acceptance.   Dental advisory given  Plan Discussed with: CRNA and Anesthesiologist  Anesthesia Plan Comments:         Anesthesia Quick Evaluation

## 2018-09-21 NOTE — Anesthesia Post-op Follow-up Note (Signed)
Anesthesia QCDR form completed.        

## 2018-09-21 NOTE — Transfer of Care (Signed)
Immediate Anesthesia Transfer of Care Note  Patient: Adrian DiegoCarlton B Pullin Jr.  Procedure(s) Performed: Partial excision of metatarsal bone (Left Foot)  Patient Location: PACU  Anesthesia Type:General  Level of Consciousness: awake, alert  and oriented  Airway & Oxygen Therapy: Patient Spontanous Breathing and Patient connected to face mask oxygen  Post-op Assessment: Report given to RN and Post -op Vital signs reviewed and stable  Post vital signs: Reviewed and stable  Last Vitals:  Vitals Value Taken Time  BP 123/69 09/21/2018  3:43 PM  Temp 36.7 C 09/21/2018  3:43 PM  Pulse 63 09/21/2018  3:45 PM  Resp 17 09/21/2018  3:45 PM  SpO2 100 % 09/21/2018  3:45 PM  Vitals shown include unvalidated device data.  Last Pain:  Vitals:   09/21/18 1308  TempSrc: Oral  PainSc: 0-No pain         Complications: No apparent anesthesia complications

## 2018-09-21 NOTE — Anesthesia Procedure Notes (Signed)
Procedure Name: LMA Insertion Date/Time: 09/21/2018 2:20 PM Performed by: Oletta LamasBlackwell, Gevorg Brum Delores, CRNA Pre-anesthesia Checklist: Patient identified, Emergency Drugs available, Suction available, Patient being monitored and Timeout performed Patient Re-evaluated:Patient Re-evaluated prior to induction Oxygen Delivery Method: Circle system utilized Preoxygenation: Pre-oxygenation with 100% oxygen Induction Type: IV induction Ventilation: Oral airway inserted - appropriate to patient size LMA: LMA inserted LMA Size: 5.0 Number of attempts: 1 Placement Confirmation: positive ETCO2 and breath sounds checked- equal and bilateral Tube secured with: Tape Dental Injury: Teeth and Oropharynx as per pre-operative assessment

## 2018-09-24 ENCOUNTER — Encounter: Payer: Self-pay | Admitting: Podiatry

## 2018-09-25 LAB — SURGICAL PATHOLOGY

## 2018-09-25 NOTE — Anesthesia Postprocedure Evaluation (Signed)
Anesthesia Post Note  Patient: Adrian DiegoCarlton B Jaco Jr.  Procedure(s) Performed: Partial excision of metatarsal bone (Left Foot)  Patient location during evaluation: PACU Anesthesia Type: General Level of consciousness: awake and alert Pain management: pain level controlled Vital Signs Assessment: post-procedure vital signs reviewed and stable Respiratory status: spontaneous breathing, nonlabored ventilation, respiratory function stable and patient connected to nasal cannula oxygen Cardiovascular status: blood pressure returned to baseline and stable Postop Assessment: no apparent nausea or vomiting Anesthetic complications: no     Last Vitals:  Vitals:   09/21/18 1623 09/21/18 1740  BP: 131/75 128/90  Pulse: 63 63  Resp: 16 18  Temp: 36.5 C   SpO2: 99% 100%    Last Pain:  Vitals:   09/24/18 0833  TempSrc:   PainSc: 2                  Lenard SimmerAndrew Tawona Filsinger

## 2018-09-26 LAB — AEROBIC/ANAEROBIC CULTURE W GRAM STAIN (SURGICAL/DEEP WOUND): Gram Stain: NONE SEEN

## 2018-11-29 ENCOUNTER — Other Ambulatory Visit (HOSPITAL_COMMUNITY): Payer: Self-pay | Admitting: Podiatry

## 2018-11-29 ENCOUNTER — Other Ambulatory Visit: Payer: Self-pay | Admitting: Podiatry

## 2018-11-29 DIAGNOSIS — L97523 Non-pressure chronic ulcer of other part of left foot with necrosis of muscle: Secondary | ICD-10-CM

## 2018-12-10 ENCOUNTER — Ambulatory Visit
Admission: RE | Admit: 2018-12-10 | Discharge: 2018-12-10 | Disposition: A | Discharge status: Home / Self Care | Payer: Medicare Other | Source: Ambulatory Visit | Attending: Podiatry | Admitting: Podiatry

## 2018-12-10 DIAGNOSIS — L97523 Non-pressure chronic ulcer of other part of left foot with necrosis of muscle: Secondary | ICD-10-CM

## 2018-12-20 ENCOUNTER — Encounter: Payer: Self-pay | Admitting: Infectious Diseases

## 2018-12-20 ENCOUNTER — Ambulatory Visit: Payer: Medicare Other | Admitting: Infectious Diseases

## 2018-12-20 ENCOUNTER — Ambulatory Visit: Payer: Medicare Other | Attending: Infectious Diseases | Admitting: Infectious Diseases

## 2018-12-20 VITALS — BP 109/71 | HR 104 | Temp 97.7°F | Ht 71.0 in | Wt 231.0 lb

## 2018-12-20 DIAGNOSIS — L97529 Non-pressure chronic ulcer of other part of left foot with unspecified severity: Secondary | ICD-10-CM

## 2018-12-20 DIAGNOSIS — Z993 Dependence on wheelchair: Secondary | ICD-10-CM

## 2018-12-20 DIAGNOSIS — M869 Osteomyelitis, unspecified: Secondary | ICD-10-CM | POA: Diagnosis not present

## 2018-12-20 DIAGNOSIS — E785 Hyperlipidemia, unspecified: Secondary | ICD-10-CM

## 2018-12-20 DIAGNOSIS — L97509 Non-pressure chronic ulcer of other part of unspecified foot with unspecified severity: Principal | ICD-10-CM

## 2018-12-20 DIAGNOSIS — E1169 Type 2 diabetes mellitus with other specified complication: Secondary | ICD-10-CM

## 2018-12-20 DIAGNOSIS — I10 Essential (primary) hypertension: Secondary | ICD-10-CM

## 2018-12-20 DIAGNOSIS — Z89422 Acquired absence of other left toe(s): Secondary | ICD-10-CM

## 2018-12-20 DIAGNOSIS — Z79899 Other long term (current) drug therapy: Secondary | ICD-10-CM

## 2018-12-20 DIAGNOSIS — F319 Bipolar disorder, unspecified: Secondary | ICD-10-CM

## 2018-12-20 DIAGNOSIS — E11621 Type 2 diabetes mellitus with foot ulcer: Secondary | ICD-10-CM

## 2018-12-20 DIAGNOSIS — E11610 Type 2 diabetes mellitus with diabetic neuropathic arthropathy: Secondary | ICD-10-CM

## 2018-12-20 NOTE — Patient Instructions (Signed)
You are here for left foot ulcer -referred by your podiatrist- there eis a depe bone involvement. You need to take Augmentin 856m PO BID for 6 weeks.  They need to do ESR, CRP , CBC, CMP once every 2 weeks and fax the labs to me [phone] Will follow up in 1 month

## 2018-12-20 NOTE — Progress Notes (Signed)
NAME: Adrian [name].  DOB: Apr 14, 1961  MRN: [id number]  Date/Time: 12/20/2018 11:36 AM  REQUESTING PROVIDER: Dr.Fowler Subjective:  REASON FOR CONSULT: left foot infection ?[facility] records reviewed and spoke to Dr.Fowler Adrian [name] Adrian [name]. is a 58 y.o. male with a history of Bipolar disorder ( in Group home), HTN, hyperlipidemia, fifth ray excision of the left,,chronic left foot ulcer Is referred to me for the same.  Pt has had an ulcer on the middle of his left oot for more than a year and is followed by podiatrist. On Dec 6th 2019 he underwent Excision bone midfoot left footand the bone did not have any osteo- culture was mixed organisms. He was getting local wound care with intermittent trimming and a deep wound culture showed proteus and he was started on Augmentin on 2/13 for 10 days. That made a significant improvement to the ulcer and He had MRI of foot on 2/24 which showed Large deep open wound involving the plantar and lateral aspect of the foot with surrounding cellulitis and myofasciitis. No discrete drainable soft tissue abscess or pyomyositis. 2. Osteomyelitis involving the cuboid and base of the fourth metatarsal with probable septic arthritis at the joint.  Pt is wheel chair bound and does not bear weight No fever or chills No hardware   Past Medical History:  Diagnosis Date  . Anemia   . Asthma   . Bipolar disorder ([place])   . Charcot foot due to diabetes mellitus ([place])   . Depression   . Dermatitis seborrheica   . Diabetes mellitus without complication ([place])   . Eczema   . Hyperlipemia   . Hypertension   . Hypothyroidism   . Neuropathy   . Psoriasis   . Psychosis ([place])   . Respiratory failure ([place])   . Sleep apnea, obstructive   . Stroke [facility])    left hand weakness    Past Surgical History:  Procedure Laterality Date  . BONE EXCISION Left 09/21/2018   Procedure: Partial excision of metatarsal bone;  Surgeon: Samara Deist, DPM;  Location:  [facility] ORS;  Service: Podiatry;  Laterality: Left;  . BONE EXOSTOSIS EXCISION Left 05/11/2018   Procedure: Excisional Debridment ulcer left foot;  Surgeon: Samara Deist, DPM;  Location: [facility] ORS;  Service: Podiatry;  Laterality: Left;  . bone removed     bone removed in foot due to infection   . TONSILLECTOMY     SH Lives in a group home No alcohol or smoking   Family History  Problem Relation Age of Onset  . Heart failure Mother   . Stroke Father   sister has mental health disability  No Known Allergies ? Current Outpatient Medications  Medication Sig Dispense Refill  . acetaminophen (TYLENOL) 325 MG tablet Take 650 mg by mouth every 4 (four) hours as needed for mild pain or fever.     Marland Kitchen albuterol (PROVENTIL HFA;VENTOLIN HFA) 108 (90 Base) MCG/ACT inhaler Inhale 2 puffs into the lungs every 6 (six) hours as needed for wheezing or shortness of breath.    Marland Kitchen alum & mag hydroxide-simeth (GERI-LANTA) 200-200-20 MG/5ML suspension Take 30 mLs by mouth as needed for indigestion or heartburn.     Marland Kitchen atorvastatin (LIPITOR) 40 MG tablet Take 40 mg by mouth at bedtime.     . Azelastine HCl 137 MCG/SPRAY SOLN Place 1 spray into both nostrils 2 (two) times daily. (0800 & 2000) Use in each nostril as directed     . bismuth subsalicylate (PEPTO  BISMOL) 262 MG/15ML suspension Take 30 mLs by mouth every 6 (six) hours as needed for indigestion.     . cholecalciferol (VITAMIN D) 1000 units tablet Take 1,000 Units by mouth daily. (0800)    . ferrous sulfate 325 (65 FE) MG tablet Take 325 mg by mouth 2 (two) times daily with a meal. (0800 & 2000)    . fluticasone furoate-vilanterol (BREO ELLIPTA) 100-25 MCG/INH AEPB Inhale 1 puff into the lungs daily. (0800)    . guaifenesin (ROBITUSSIN) 100 MG/5ML syrup Take 200 mg by mouth 3 (three) times daily as needed for cough.    Marland Kitchen HYDROcodone-acetaminophen (NORCO) 5-325 MG tablet Take 1 tablet by mouth every 6 (six) hours as needed for moderate pain. 30 tablet 0  .  levothyroxine (SYNTHROID, LEVOTHROID) 75 MCG tablet Take 75 mcg by mouth daily before breakfast. (0700)    . lisinopril (PRINIVIL,ZESTRIL) 2.5 MG tablet Take 2.5 mg by mouth daily. (0800)    . loperamide (IMODIUM A-D) 2 MG tablet Take 2-4 mg by mouth 4 (four) times daily as needed for diarrhea or loose stools.     . magnesium hydroxide (MILK OF MAGNESIA) 400 MG/5ML suspension Take 15-30 mLs by mouth daily as needed for mild constipation.     . metoprolol (LOPRESSOR) 100 MG tablet Take 1 tablet (100 mg total) by mouth 2 (two) times daily. (Patient taking differently: Take 100 mg by mouth 2 (two) times daily. (0800 & 2000)) 60 tablet 0  . montelukast (SINGULAIR) 10 MG tablet Take 10 mg by mouth daily. (0800)    . Multiple Vitamin (THEREMS PO) Take 1 tablet by mouth daily. (0800)    . nystatin (NYSTATIN) powder Apply 1 g topically 2 (two) times daily as needed (FOR FUNGAL RASH). Under abdomen/groin    . oxcarbazepine (TRILEPTAL) 600 MG tablet Take 600 mg by mouth 2 (two) times daily. (0800 & 2000)    . oxyCODONE (ROXICODONE) 5 MG immediate release tablet Take 1 tablet (5 mg total) by mouth every 4 (four) hours as needed for severe pain. 30 tablet 0  . pantoprazole (PROTONIX) 40 MG tablet Take 1 tablet (40 mg total) by mouth daily at 12 noon. 30 tablet 0  . senna (SENOKOT) 8.6 MG tablet Take 1 tablet by mouth at bedtime as needed for constipation.    . Tiotropium Bromide Monohydrate (SPIRIVA RESPIMAT) 2.5 MCG/ACT AERS Inhale 1 puff into the lungs daily. (0800)    . triamcinolone cream (KENALOG) 0.1 % Apply 1 application topically 2 (two) times daily as needed (until rash clears). (0800 & 2000)    . ziprasidone (GEODON) 40 MG capsule Take 40 mg by mouth 2 (two) times daily with a meal. (0800 & 2000)  3  . zolpidem (AMBIEN) 5 MG tablet Take 5 mg by mouth at bedtime.     No current facility-administered medications for this visit.      Abtx:  Anti-infectives (From admission, onward)   None       REVIEW OF SYSTEMS:  Const: negative fever, negative chills, negative weight loss Eyes: negative diplopia or visual changes, negative eye pain ENT: negative coryza, negative sore throat Resp: negative cough, hemoptysis, dyspnea Cards: negative for chest pain, palpitations, lower extremity edema GU: negative for frequency, dysuria and hematuria GI: Negative for abdominal pain, diarrhea, bleeding, constipation Skin: negative for rash and pruritus Heme: negative for easy bruising and gum/nose bleeding MS: negative for myalgias, arthralgias, back pain and muscle weakness Neurolo:negative for headaches, dizziness, vertigo, memory problems  Psych:  negative for feelings of anxiety, depression  Endocrine: no polyuria or polydipsia Allergy/Immunology- negative for any medication or food allergies ? Pertinent Positives include : Objective:  VITALS:  BP 109/71 (BP Location: Right Arm, Patient Position: Sitting, Cuff Size: Large)   Pulse (!) 104   Temp 97.7 F (36.5 C) (Oral)   Ht '5\' 11"'  (1.803 m)   Wt 231 lb (104.8 kg)   BMI 32.22 kg/m  PHYSICAL EXAM:  Limited examination as patient in wheel chair Lungs: Clear to auscultation bilaterally. No Wheezing or Rhonchi. No rales. Heart: Regular rate and rhythm, no murmur, rub or gallop. Abdomen not eamined Extremities: left foot- charcot  Ulcer on the plantar apsect of mid foot- covering with granulation tissu- not able to probe bone   Surrounding maceration Skin: limited examination Lymph: Cervical, supraclavicular normal. Neurologic: Grossly non-focal Pertinent Labs Lab Results None available  Micro Proteus from feb 13  Pathology 09/21/18 BONE AND SOFT TISSUE, LEFT FOOT; DEBRIDEMENT:  - FRAGMENTS OF VIABLE BONE AND SOFT TISSUE WITH FIBROSIS AND CHRONIC  INFLAMMATORY CHANGES.  - NO EVIDENCE OF ACUTE OSTEOMYELITIS.   04/21/18 ULCER, LEFT FOOT; DEBRIDEMENT:  - CHRONIC ULCERATION WITH REACTIVE FIBROSIS.   B. ULCER, LEFT FOOT  BONE; DEBRIDEMENT:  - OSTEOMYELITIS.   Microbiology: No results found for this or any previous visit (from the past 240 hour(s)).  IMAGING RESULTS:  MRI 12/10/18 There is a large deep open wound involving the plantar and lateral aspect of the foot which extends right down to the underlying cuboid which demonstrates diffuse signal abnormality consistent with osteomyelitis. There is also destructive bony changes and signal abnormality in the base of the fourth metatarsal consistent with osteomyelitis. The fifth metatarsal has been largely resected I have personally reviewed the films ? Impression/Recommendation ?58 y.o. male with a history of Bipolar disorder, DM ( in Group home), HTN, hyperlipidemia, fifth ray excision of the left,,chronic left foot ulcer  ? Chronic left foot ulcer with MRI evidence of OM of cuboid bone- also has 5th and 4th met tarsal excision. DM /charcot contributing to the ulcer Wound base has granulation tissue- improving on PO augmentin Will ask group home to draw ESR/CRP /CBC/CMP every 2 weeks- continue augmentin for 4-6 weeks 874m PO BID, no IV needed currently  ?will follow in 1 month  Bipolar disorder-stable on meds   ___________________________________________________ Discussed with patient and Dr.Fowler

## 2019-01-24 ENCOUNTER — Ambulatory Visit: Payer: Medicare Other | Admitting: Infectious Diseases

## 2019-02-14 ENCOUNTER — Ambulatory Visit: Payer: Medicare Other | Admitting: Infectious Diseases

## 2019-03-14 ENCOUNTER — Ambulatory Visit: Payer: Medicare Other | Admitting: Infectious Diseases

## 2019-08-31 IMAGING — MR MR FOOT*L* WO/W CM
10 series · 40 of 40 positions shown · IV contrast (multihance)
Comparison: Plain films left foot 09/22/2017. MRI left foot
08/11/2014.

CLINICAL DATA: Chronic left foot wound on and off for 7 years began
to drain over the past week. Question osteomyelitis.

EXAM:
MRI OF THE LEFT FOREFOOT WITHOUT AND WITH CONTRAST
TECHNIQUE: Multiplanar, multisequence MR imaging of the left forefoot was
performed both before and after administration of intravenous
contrast.
CONTRAST:  20 cc MultiHance IV.

[Series 6: axial t1fs (only · oblique · 3.0mm · 0.94mm/px · 5 of 39 slices shown (1 of 2)]
[im 1/39]
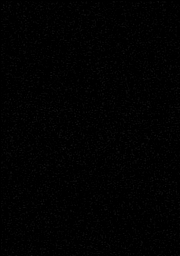
[im 10/39]
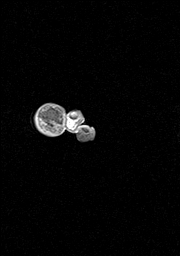
[im 20/39]
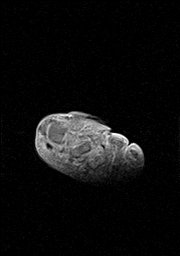
[im 29/39]
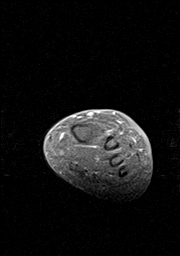
[im 39/39]
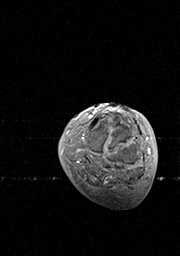

[Series 7: T2 fat-sat · oblique · 3.0mm · 0.47mm/px · 5 of 39 slices shown]
[im 1/39]
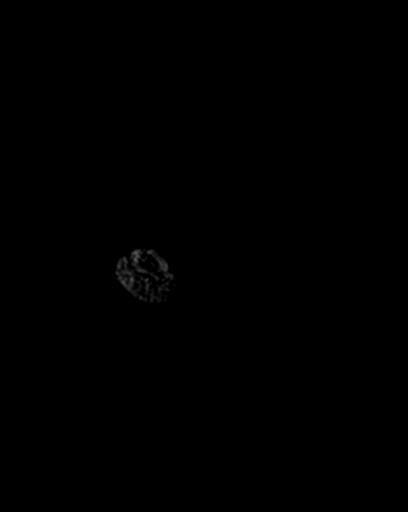
[im 10/39]
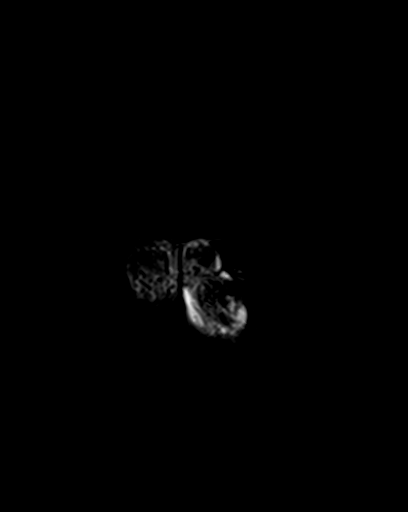
[im 20/39]
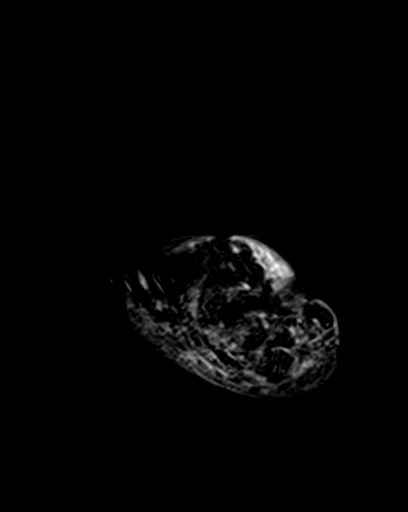
[im 29/39]
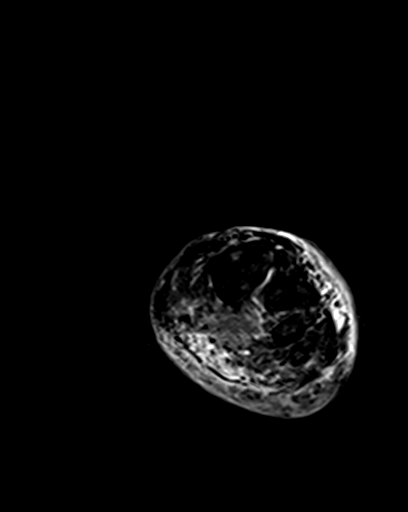
[im 39/39]
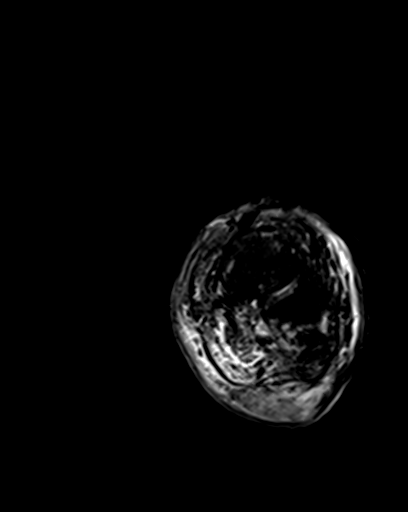

[Series 8: T1 · axial · 3.0mm · 0.62mm/px · z∈[-92,-15]mm · 4 of 29 slices shown (1 of 2)]
[im 1/29]
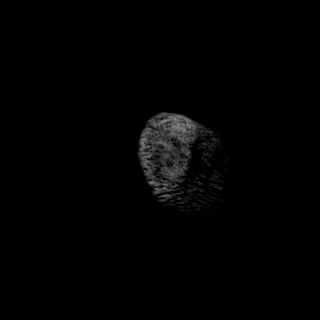
[im 10/29]
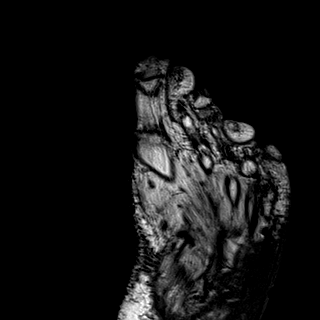
[im 19/29]
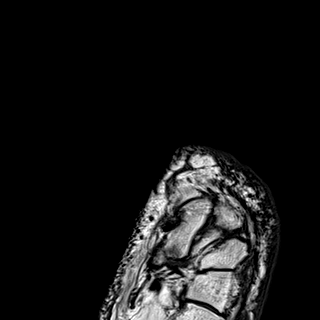
[im 29/29]
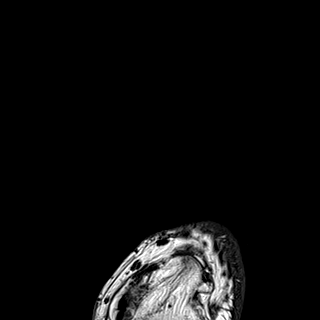

[Series 10: T1 · oblique · 3.0mm · 0.75mm/px · 4 of 39 slices shown (2 of 2)]
[im 1/39]
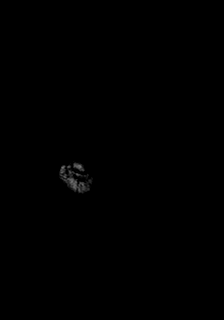
[im 13/39]
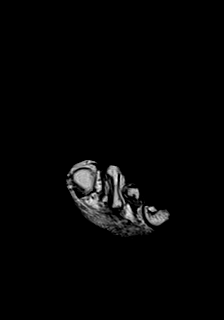
[im 26/39]
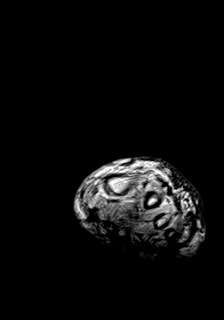
[im 39/39]
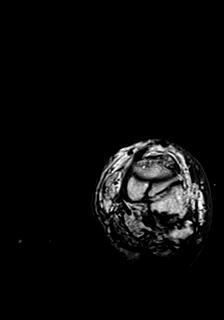

[Series 11: STIR · axial · 3.0mm · 0.39mm/px · z∈[-92,-15]mm · 3 of 29 slices shown (1 of 2)]
[im 1/29]
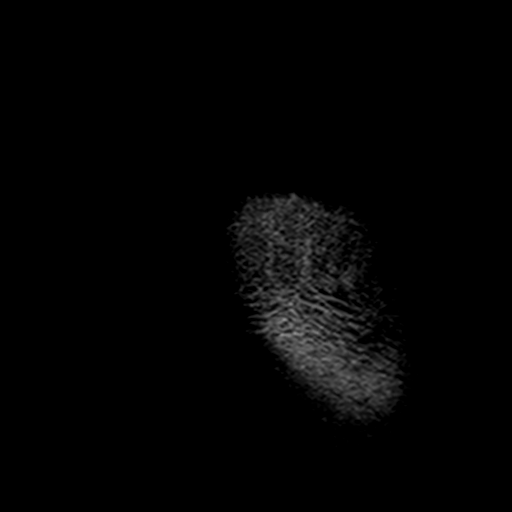
[im 15/29]
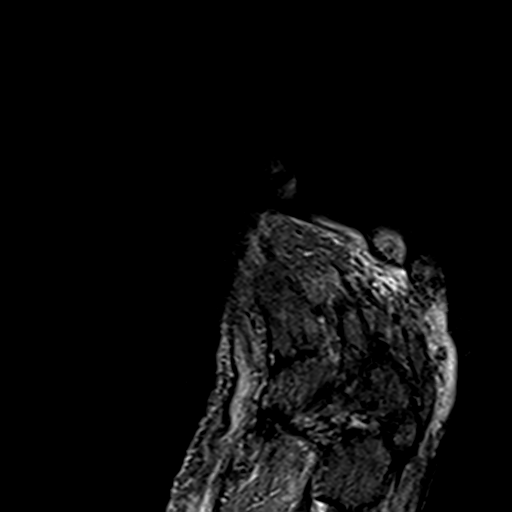
[im 29/29]
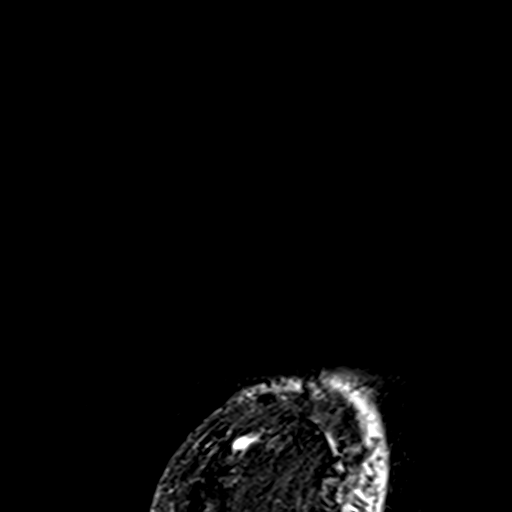

[Series 12: axial t1fs (only · oblique · 3.0mm · 0.94mm/px · 4 of 39 slices shown (2 of 2)]
[im 1/39]
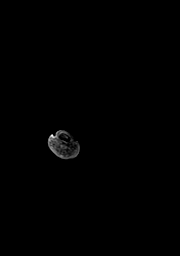
[im 13/39]
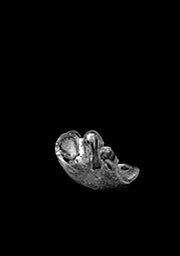
[im 26/39]
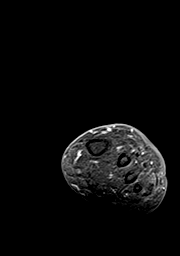
[im 39/39]
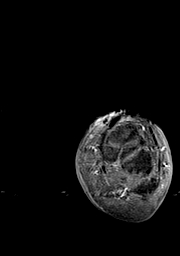

[Series 13: STIR · sagittal · 3.0mm · 0.39mm/px · 4 of 37 slices shown (2 of 2)]
[im 1/37]
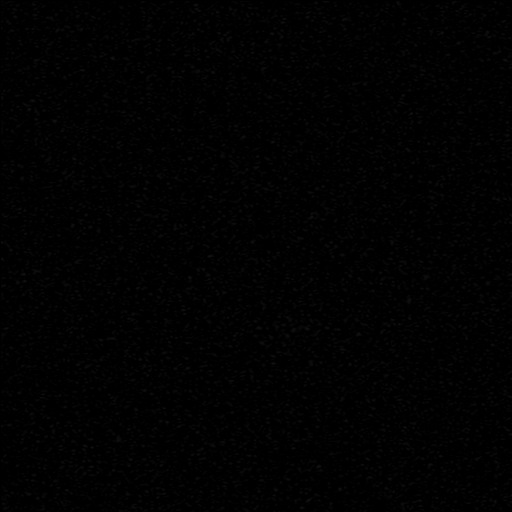
[im 13/37]
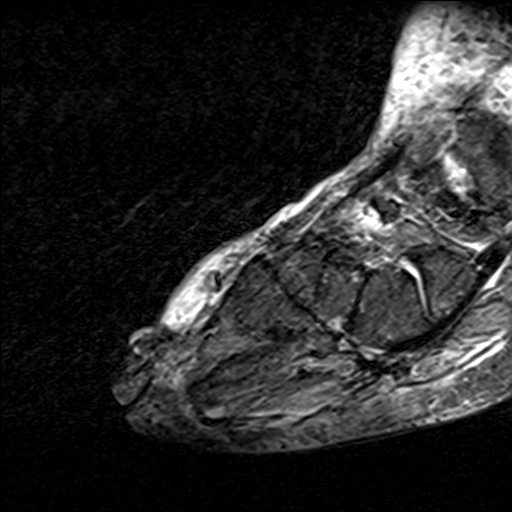
[im 25/37]
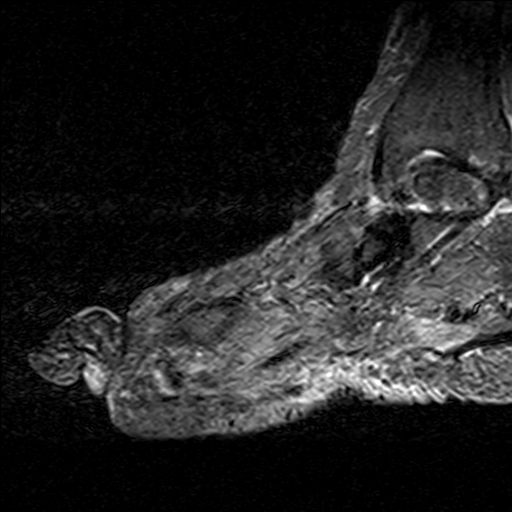
[im 37/37]
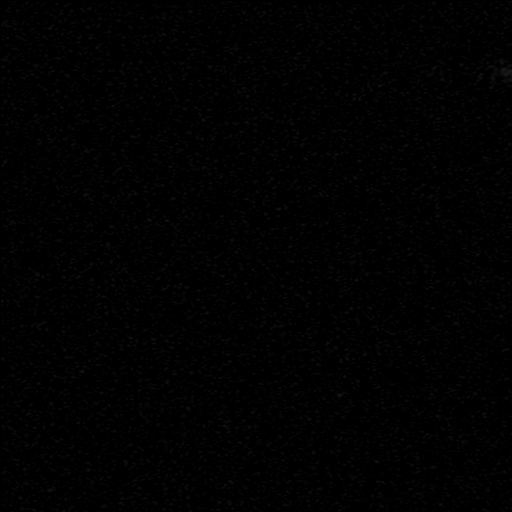

[Series 14: T1 fat-sat · oblique · 3.0mm · 0.94mm/px · 4 of 39 slices shown (1 of 3)]
[im 1/39]
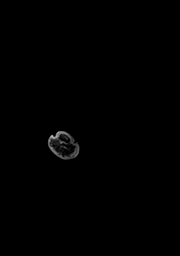
[im 13/39]
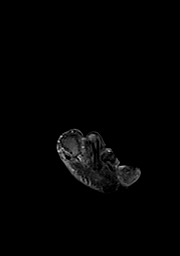
[im 26/39]
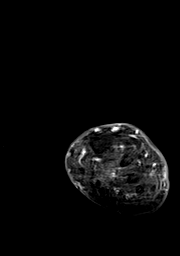
[im 39/39]
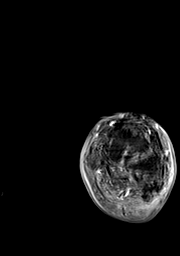

[Series 16: T1 fat-sat · sagittal · 3.0mm · 0.78mm/px · 4 of 37 slices shown (2 of 3)]
[im 1/37]
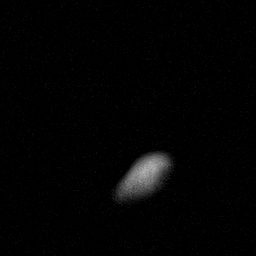
[im 13/37]
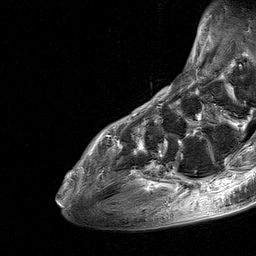
[im 25/37]
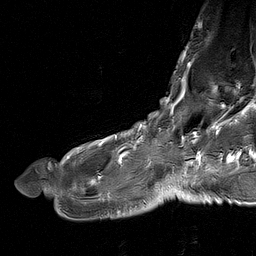
[im 37/37]
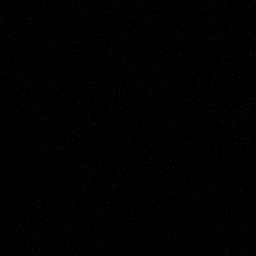

[Series 17: T1 fat-sat · axial · 3.0mm · 0.62mm/px · z∈[-92,-15]mm · 3 of 29 slices shown (3 of 3)]
[im 1/29]
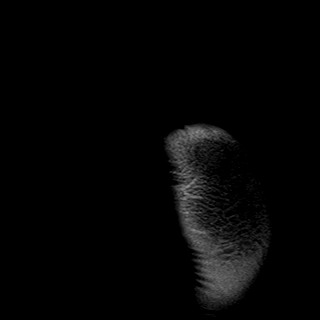
[im 15/29]
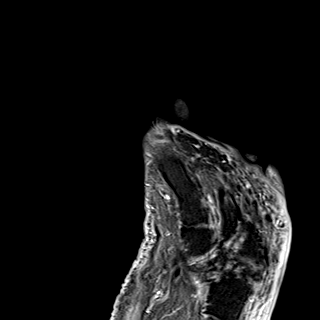
[im 29/29]
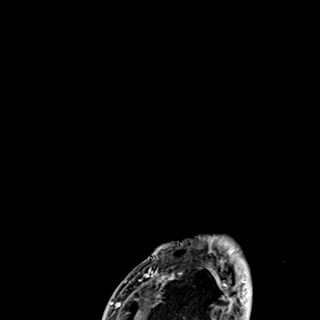

[40 of 40 positions shown; findings below may reference images not displayed]

FINDINGS: Patient motion somewhat degrades the exam.

Bones/Joint/Cartilage

The patient is status post amputation of the fifth metatarsal. No
bone marrow edema or enhancement to suggest osteomyelitis is
identified.

Ligaments

Intact.

Muscles and Tendons

Intact. Severe fatty atrophy of intrinsic musculature the foot
noted.

Soft tissues

A soft tissue wound is seen is seen on the plantar surface of the
foot laterally. A small amount of fluid tracks into the wound patent
and measures approximately 2 cm craniocaudal by up to 0.6 cm AP by
0.8 cm transverse. Soft tissues surrounding the wound are edematous
and enhancing.
IMPRESSION: Soft tissue wound on the lateral side aspect of the foot with
surrounding cellulitis. A small amount of fluid tracks into the
wound. Negative for osteomyelitis or septic joint.

## 2020-12-17 ENCOUNTER — Encounter: Payer: Self-pay | Admitting: Emergency Medicine

## 2020-12-17 ENCOUNTER — Other Ambulatory Visit: Payer: Self-pay

## 2020-12-17 ENCOUNTER — Inpatient Hospital Stay: Payer: Medicare Other

## 2020-12-17 ENCOUNTER — Inpatient Hospital Stay
Admission: EM | Admit: 2020-12-17 | Discharge: 2020-12-30 | DRG: 617 | Disposition: A | Discharge status: Skilled Nursing Facility | Payer: Medicare Other | Attending: Family Medicine | Admitting: Family Medicine

## 2020-12-17 DIAGNOSIS — E11610 Type 2 diabetes mellitus with diabetic neuropathic arthropathy: Secondary | ICD-10-CM | POA: Diagnosis present

## 2020-12-17 DIAGNOSIS — Z7951 Long term (current) use of inhaled steroids: Secondary | ICD-10-CM

## 2020-12-17 DIAGNOSIS — R8281 Pyuria: Secondary | ICD-10-CM | POA: Diagnosis present

## 2020-12-17 DIAGNOSIS — Z6841 Body Mass Index (BMI) 40.0 and over, adult: Secondary | ICD-10-CM

## 2020-12-17 DIAGNOSIS — Z4781 Encounter for orthopedic aftercare following surgical amputation: Secondary | ICD-10-CM | POA: Diagnosis not present

## 2020-12-17 DIAGNOSIS — Z8249 Family history of ischemic heart disease and other diseases of the circulatory system: Secondary | ICD-10-CM

## 2020-12-17 DIAGNOSIS — E1142 Type 2 diabetes mellitus with diabetic polyneuropathy: Secondary | ICD-10-CM | POA: Diagnosis present

## 2020-12-17 DIAGNOSIS — I10 Essential (primary) hypertension: Secondary | ICD-10-CM

## 2020-12-17 DIAGNOSIS — L97429 Non-pressure chronic ulcer of left heel and midfoot with unspecified severity: Secondary | ICD-10-CM | POA: Diagnosis present

## 2020-12-17 DIAGNOSIS — Z89512 Acquired absence of left leg below knee: Secondary | ICD-10-CM | POA: Diagnosis not present

## 2020-12-17 DIAGNOSIS — Z79899 Other long term (current) drug therapy: Secondary | ICD-10-CM

## 2020-12-17 DIAGNOSIS — M86672 Other chronic osteomyelitis, left ankle and foot: Secondary | ICD-10-CM | POA: Diagnosis present

## 2020-12-17 DIAGNOSIS — J4540 Moderate persistent asthma, uncomplicated: Secondary | ICD-10-CM | POA: Diagnosis present

## 2020-12-17 DIAGNOSIS — F319 Bipolar disorder, unspecified: Secondary | ICD-10-CM | POA: Diagnosis present

## 2020-12-17 DIAGNOSIS — G4733 Obstructive sleep apnea (adult) (pediatric): Secondary | ICD-10-CM | POA: Diagnosis present

## 2020-12-17 DIAGNOSIS — D72829 Elevated white blood cell count, unspecified: Secondary | ICD-10-CM | POA: Diagnosis not present

## 2020-12-17 DIAGNOSIS — D62 Acute posthemorrhagic anemia: Secondary | ICD-10-CM | POA: Diagnosis not present

## 2020-12-17 DIAGNOSIS — M86172 Other acute osteomyelitis, left ankle and foot: Secondary | ICD-10-CM | POA: Diagnosis present

## 2020-12-17 DIAGNOSIS — R319 Hematuria, unspecified: Secondary | ICD-10-CM | POA: Diagnosis not present

## 2020-12-17 DIAGNOSIS — E1169 Type 2 diabetes mellitus with other specified complication: Secondary | ICD-10-CM | POA: Diagnosis present

## 2020-12-17 DIAGNOSIS — D509 Iron deficiency anemia, unspecified: Secondary | ICD-10-CM | POA: Diagnosis present

## 2020-12-17 DIAGNOSIS — R5082 Postprocedural fever: Secondary | ICD-10-CM | POA: Diagnosis not present

## 2020-12-17 DIAGNOSIS — E11628 Type 2 diabetes mellitus with other skin complications: Secondary | ICD-10-CM | POA: Diagnosis present

## 2020-12-17 DIAGNOSIS — E785 Hyperlipidemia, unspecified: Secondary | ICD-10-CM

## 2020-12-17 DIAGNOSIS — R338 Other retention of urine: Secondary | ICD-10-CM | POA: Diagnosis not present

## 2020-12-17 DIAGNOSIS — K219 Gastro-esophageal reflux disease without esophagitis: Secondary | ICD-10-CM | POA: Diagnosis present

## 2020-12-17 DIAGNOSIS — E11621 Type 2 diabetes mellitus with foot ulcer: Principal | ICD-10-CM

## 2020-12-17 DIAGNOSIS — E119 Type 2 diabetes mellitus without complications: Secondary | ICD-10-CM

## 2020-12-17 DIAGNOSIS — L03116 Cellulitis of left lower limb: Secondary | ICD-10-CM | POA: Diagnosis present

## 2020-12-17 DIAGNOSIS — Z87891 Personal history of nicotine dependence: Secondary | ICD-10-CM

## 2020-12-17 DIAGNOSIS — M79605 Pain in left leg: Secondary | ICD-10-CM | POA: Diagnosis not present

## 2020-12-17 DIAGNOSIS — Z823 Family history of stroke: Secondary | ICD-10-CM

## 2020-12-17 DIAGNOSIS — R710 Precipitous drop in hematocrit: Secondary | ICD-10-CM | POA: Diagnosis not present

## 2020-12-17 DIAGNOSIS — L7632 Postprocedural hematoma of skin and subcutaneous tissue following other procedure: Secondary | ICD-10-CM | POA: Diagnosis not present

## 2020-12-17 DIAGNOSIS — Z20822 Contact with and (suspected) exposure to covid-19: Secondary | ICD-10-CM | POA: Diagnosis present

## 2020-12-17 DIAGNOSIS — M869 Osteomyelitis, unspecified: Secondary | ICD-10-CM | POA: Diagnosis present

## 2020-12-17 DIAGNOSIS — M14672 Charcot's joint, left ankle and foot: Secondary | ICD-10-CM | POA: Diagnosis not present

## 2020-12-17 DIAGNOSIS — N401 Enlarged prostate with lower urinary tract symptoms: Secondary | ICD-10-CM | POA: Diagnosis present

## 2020-12-17 DIAGNOSIS — L97428 Non-pressure chronic ulcer of left heel and midfoot with other specified severity: Secondary | ICD-10-CM | POA: Diagnosis not present

## 2020-12-17 DIAGNOSIS — R5081 Fever presenting with conditions classified elsewhere: Secondary | ICD-10-CM | POA: Diagnosis not present

## 2020-12-17 DIAGNOSIS — E662 Morbid (severe) obesity with alveolar hypoventilation: Secondary | ICD-10-CM | POA: Diagnosis present

## 2020-12-17 DIAGNOSIS — M79606 Pain in leg, unspecified: Secondary | ICD-10-CM

## 2020-12-17 DIAGNOSIS — D519 Vitamin B12 deficiency anemia, unspecified: Secondary | ICD-10-CM | POA: Diagnosis present

## 2020-12-17 DIAGNOSIS — Y835 Amputation of limb(s) as the cause of abnormal reaction of the patient, or of later complication, without mention of misadventure at the time of the procedure: Secondary | ICD-10-CM | POA: Diagnosis not present

## 2020-12-17 DIAGNOSIS — E039 Hypothyroidism, unspecified: Secondary | ICD-10-CM | POA: Diagnosis present

## 2020-12-17 DIAGNOSIS — Z79890 Hormone replacement therapy (postmenopausal): Secondary | ICD-10-CM

## 2020-12-17 DIAGNOSIS — J45909 Unspecified asthma, uncomplicated: Secondary | ICD-10-CM | POA: Diagnosis present

## 2020-12-17 LAB — CBC WITH DIFFERENTIAL/PLATELET
Abs Immature Granulocytes: 0.04 10*3/uL (ref 0.00–0.07)
Basophils Absolute: 0 10*3/uL (ref 0.0–0.1)
Basophils Relative: 0 %
Eosinophils Absolute: 0.2 10*3/uL (ref 0.0–0.5)
Eosinophils Relative: 2 %
HCT: 36.8 % — ABNORMAL LOW (ref 39.0–52.0)
Hemoglobin: 12.4 g/dL — ABNORMAL LOW (ref 13.0–17.0)
Immature Granulocytes: 1 %
Lymphocytes Relative: 23 %
Lymphs Abs: 1.9 10*3/uL (ref 0.7–4.0)
MCH: 30.5 pg (ref 26.0–34.0)
MCHC: 33.7 g/dL (ref 30.0–36.0)
MCV: 90.4 fL (ref 80.0–100.0)
Monocytes Absolute: 0.7 10*3/uL (ref 0.1–1.0)
Monocytes Relative: 8 %
Neutro Abs: 5.6 10*3/uL (ref 1.7–7.7)
Neutrophils Relative %: 66 %
Platelets: 264 10*3/uL (ref 150–400)
RBC: 4.07 MIL/uL — ABNORMAL LOW (ref 4.22–5.81)
RDW: 13.4 % (ref 11.5–15.5)
WBC: 8.4 10*3/uL (ref 4.0–10.5)
nRBC: 0 % (ref 0.0–0.2)

## 2020-12-17 LAB — URINALYSIS, COMPLETE (UACMP) WITH MICROSCOPIC
Bilirubin Urine: NEGATIVE
Glucose, UA: NEGATIVE mg/dL
Hgb urine dipstick: NEGATIVE
Ketones, ur: NEGATIVE mg/dL
Nitrite: NEGATIVE
Protein, ur: NEGATIVE mg/dL
Specific Gravity, Urine: 1.008 (ref 1.005–1.030)
Squamous Epithelial / HPF: NONE SEEN (ref 0–5)
WBC, UA: >50 WBC/hpf — ABNORMAL HIGH (ref 0–5)
pH: 6 (ref 5.0–8.0)

## 2020-12-17 LAB — COMPREHENSIVE METABOLIC PANEL
ALT: 16 U/L (ref 0–44)
AST: 17 U/L (ref 15–41)
Albumin: 4 g/dL (ref 3.5–5.0)
Alkaline Phosphatase: 149 U/L — ABNORMAL HIGH (ref 38–126)
Anion gap: 11 (ref 5–15)
BUN: 17 mg/dL (ref 6–20)
CO2: 24 mmol/L (ref 22–32)
Calcium: 8.9 mg/dL (ref 8.9–10.3)
Chloride: 107 mmol/L (ref 98–111)
Creatinine, Ser: 1.03 mg/dL (ref 0.61–1.24)
GFR, Estimated: >60 mL/min (ref >60)
Glucose, Bld: 101 mg/dL — ABNORMAL HIGH (ref 70–99)
Potassium: 4.5 mmol/L (ref 3.5–5.1)
Sodium: 142 mmol/L (ref 135–145)
Total Bilirubin: 0.5 mg/dL (ref 0.3–1.2)
Total Protein: 7.4 g/dL (ref 6.5–8.1)

## 2020-12-17 LAB — GLUCOSE, CAPILLARY
Glucose-Capillary: 71 mg/dL (ref 70–99)
Glucose-Capillary: 121 mg/dL — ABNORMAL HIGH (ref 70–99)

## 2020-12-17 LAB — LACTIC ACID, PLASMA: Lactic Acid, Venous: 1.1 mmol/L (ref 0.5–1.9)

## 2020-12-17 LAB — MAGNESIUM: Magnesium: 2.1 mg/dL (ref 1.7–2.4)

## 2020-12-17 LAB — SEDIMENTATION RATE: Sed Rate: 65 mm/hr — ABNORMAL HIGH (ref 0–20)

## 2020-12-17 MED ORDER — OXCARBAZEPINE 300 MG PO TABS
600.0000 mg | ORAL_TABLET | Freq: Two times a day (BID) | ORAL | Status: DC
Start: 2020-12-17 — End: 2020-12-30
  Administered 2020-12-17 – 2020-12-30 (×26): 600 mg via ORAL
  Filled 2020-12-17 (×27): qty 2

## 2020-12-17 MED ORDER — ZOLPIDEM TARTRATE 5 MG PO TABS
5.0000 mg | ORAL_TABLET | Freq: Every evening | ORAL | Status: DC | PRN
  Administered 2020-12-17 – 2020-12-29 (×7): 5 mg via ORAL
  Filled 2020-12-17 (×9): qty 1

## 2020-12-17 MED ORDER — ZIPRASIDONE HCL 40 MG PO CAPS
40.0000 mg | ORAL_CAPSULE | Freq: Two times a day (BID) | ORAL | Status: DC
  Administered 2020-12-17 – 2020-12-30 (×24): 40 mg via ORAL
  Filled 2020-12-17 (×30): qty 1

## 2020-12-17 MED ORDER — ACETAMINOPHEN 650 MG RE SUPP: 650.0000 mg | Freq: Four times a day (QID) | RECTAL | Status: DC | PRN

## 2020-12-17 MED ORDER — ATORVASTATIN CALCIUM 20 MG PO TABS
40.0000 mg | ORAL_TABLET | Freq: Every day | ORAL | Status: DC
  Administered 2020-12-17 – 2020-12-29 (×13): 40 mg via ORAL
  Filled 2020-12-17 (×13): qty 2

## 2020-12-17 MED ORDER — UMECLIDINIUM BROMIDE 62.5 MCG/INH IN AEPB
1.0000 | INHALATION_SPRAY | Freq: Every day | RESPIRATORY_TRACT | Status: DC
  Administered 2020-12-18 – 2020-12-30 (×13): 1 via RESPIRATORY_TRACT
  Filled 2020-12-17 (×2): qty 7

## 2020-12-17 MED ORDER — PANTOPRAZOLE SODIUM 40 MG PO TBEC
40.0000 mg | DELAYED_RELEASE_TABLET | Freq: Every day | ORAL | Status: DC
  Administered 2020-12-18 – 2020-12-25 (×8): 40 mg via ORAL
  Filled 2020-12-17 (×9): qty 1

## 2020-12-17 MED ORDER — TIOTROPIUM BROMIDE MONOHYDRATE 2.5 MCG/ACT IN AERS: 1.0000 | INHALATION_SPRAY | Freq: Every day | RESPIRATORY_TRACT | Status: DC

## 2020-12-17 MED ORDER — ACETAMINOPHEN 325 MG PO TABS
650.0000 mg | ORAL_TABLET | Freq: Four times a day (QID) | ORAL | Status: DC | PRN
  Administered 2020-12-18 – 2020-12-27 (×3): 650 mg via ORAL
  Filled 2020-12-17 (×4): qty 2

## 2020-12-17 MED ORDER — LEVOTHYROXINE SODIUM 50 MCG PO TABS
75.0000 ug | ORAL_TABLET | Freq: Every day | ORAL | Status: DC
  Administered 2020-12-18 – 2020-12-30 (×12): 75 ug via ORAL
  Filled 2020-12-17 (×12): qty 1

## 2020-12-17 MED ORDER — FERROUS SULFATE 325 (65 FE) MG PO TABS
325.0000 mg | ORAL_TABLET | Freq: Two times a day (BID) | ORAL | Status: DC
Start: 2020-12-17 — End: 2020-12-30
  Administered 2020-12-17 – 2020-12-30 (×25): 325 mg via ORAL
  Filled 2020-12-17 (×27): qty 1

## 2020-12-17 MED ORDER — VANCOMYCIN HCL IN DEXTROSE 1-5 GM/200ML-% IV SOLN
1000.0000 mg | Freq: Two times a day (BID) | INTRAVENOUS | Status: DC
  Administered 2020-12-18: 05:00:00 1000 mg via INTRAVENOUS
  Filled 2020-12-17 (×2): qty 200

## 2020-12-17 MED ORDER — VANCOMYCIN HCL 2000 MG/400ML IV SOLN
2000.0000 mg | Freq: Once | INTRAVENOUS | Status: AC
  Administered 2020-12-17: 18:00:00 2000 mg via INTRAVENOUS
  Filled 2020-12-17 (×2): qty 400

## 2020-12-17 MED ORDER — GADOBUTROL 1 MMOL/ML IV SOLN
10.0000 mL | Freq: Once | INTRAVENOUS | Status: AC | PRN
  Administered 2020-12-17: 10 mL via INTRAVENOUS

## 2020-12-17 MED ORDER — ALBUTEROL SULFATE HFA 108 (90 BASE) MCG/ACT IN AERS
2.0000 | INHALATION_SPRAY | Freq: Four times a day (QID) | RESPIRATORY_TRACT | Status: DC | PRN
  Filled 2020-12-17: qty 6.7

## 2020-12-17 MED ORDER — MONTELUKAST SODIUM 10 MG PO TABS
10.0000 mg | ORAL_TABLET | Freq: Every day | ORAL | Status: DC
  Administered 2020-12-18 – 2020-12-30 (×13): 10 mg via ORAL
  Filled 2020-12-17 (×13): qty 1

## 2020-12-17 MED ORDER — FENTANYL CITRATE (PF) 100 MCG/2ML IJ SOLN
12.5000 ug | INTRAMUSCULAR | Status: DC | PRN
  Administered 2020-12-23: 12.5 ug via INTRAVENOUS
  Filled 2020-12-17: qty 2

## 2020-12-17 MED ORDER — INSULIN ASPART 100 UNIT/ML ~~LOC~~ SOLN
0.0000 [IU] | Freq: Three times a day (TID) | SUBCUTANEOUS | Status: DC
  Filled 2020-12-17 (×2): qty 1

## 2020-12-17 MED ORDER — FLUTICASONE FUROATE-VILANTEROL 100-25 MCG/INH IN AEPB
1.0000 | INHALATION_SPRAY | Freq: Every day | RESPIRATORY_TRACT | Status: DC
  Administered 2020-12-18 – 2020-12-30 (×13): 1 via RESPIRATORY_TRACT
  Filled 2020-12-17 (×2): qty 28

## 2020-12-17 MED ORDER — PIPERACILLIN-TAZOBACTAM 3.375 G IVPB 30 MIN
3.3750 g | Freq: Once | INTRAVENOUS | Status: AC
  Administered 2020-12-17: 3.375 g via INTRAVENOUS
  Filled 2020-12-17: qty 50

## 2020-12-17 MED ORDER — LACTATED RINGERS IV SOLN: INTRAVENOUS | Status: AC

## 2020-12-17 MED ORDER — SODIUM CHLORIDE 0.9 % IV SOLN
3.0000 g | Freq: Four times a day (QID) | INTRAVENOUS | Status: DC
  Administered 2020-12-17 – 2020-12-25 (×32): 3 g via INTRAVENOUS
  Filled 2020-12-17: qty 8
  Filled 2020-12-17 (×2): qty 3
  Filled 2020-12-17: qty 8
  Filled 2020-12-17 (×4): qty 3
  Filled 2020-12-17 (×2): qty 8
  Filled 2020-12-17 (×3): qty 3
  Filled 2020-12-17: qty 8
  Filled 2020-12-17 (×3): qty 3
  Filled 2020-12-17: qty 8
  Filled 2020-12-17: qty 3
  Filled 2020-12-17 (×3): qty 8
  Filled 2020-12-17 (×2): qty 3
  Filled 2020-12-17: qty 8
  Filled 2020-12-17 (×3): qty 3
  Filled 2020-12-17: qty 8
  Filled 2020-12-17: qty 3
  Filled 2020-12-17: qty 8
  Filled 2020-12-17 (×2): qty 3
  Filled 2020-12-17: qty 8

## 2020-12-17 NOTE — H&P (Signed)
History and Physical    PLEASE NOTE THAT DRAGON DICTATION SOFTWARE WAS USED IN THE CONSTRUCTION OF THIS NOTE.   [place] JSE:[id number] DOB: 11-Apr-1961 DOA: 12/17/2020  PCP: [facility] Patient coming from: home   I have personally briefly reviewed patient's old medical records in [place]  Chief Complaint: Left foot pain  HPI: Adrian [name]. is a 60 y.o. 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   The patient reports 2 weeks of worsening erythema, swelling, pain associated with the plantar aspect of the left midfoot.  He also notes progressive purulent discharge over that time.  He has been seeing his podiatrist, Dr.  Vickki Muff, for management of the above.  Specifically, he most recently was seen in podiatry clinic by Dr. Vickki Muff earlier today, at which time in the setting of interval worsening of erythema, swelling, and tenderness associated with the left plantar foot lesion, the patient was instructed by Dr.Fowler To present to the [facility] ED for further evaluation and management of infected left diabetic foot wound.   The patient denies any preceding trauma involving the left foot leading up to the onset of development of left plantar erythema, swelling, tenderness.  Denies any associated acute numbness, paresthesias, or crepitus.  He also denies any associated subjective fever, chills, rigors, or generalized myalgias.  Of note, medical history pertinent for type 2 diabetes mellitus complicated by peripheral polyneuropathy.  Most recent hemoglobin A1c noted to be 5.1% when checked on 09/23/2020.   In the setting of history of glycemic control, patient's diabetes is currently being managed via conservative lifestyle modifications, and he is not currently on any insulin or oral hypoglycemic agents as an outpatient.   Denies any recent headache, neck stiffness, rhinitis, rhinorrhea, sore throat, sob, wheezing, cough, nausea, vomiting, abdominal pain, diarrhea. No recent traveling or known COVID-19 exposures. Denies dysuria, gross hematuria, or change in urinary urgency/frequency.  Denies any recent chest pain, diaphoresis, or palpitations.     ED Course:  Vital signs in the ED were notable for the following: 97.9, heart rate 59-65, blood pressure 106/52 -121/72; respiratory rate 18, oxygen saturation 98 to 99% on room air.  Labs were notable for the following: CMP was notable for the following: Bicarbonate 24, BUN 17, creatinine 1.03, glucose 101.  CBC was notable for white blood cell count of 8400.  Lactic acid 1.1.  Urinalysis showed greater than 50 white blood cells, but rare bacteria and was nitrate negative.  Screening nasopharyngeal COVID-19 PCR was performed in the ED today, with result currently pending.  The patient's case was discussed with Dr. Vickki Muff  Of podiatry, who confirmed that he would formally consult on this patient.  Dr.Fowler Recommended obtaining MRI of the left foot to further evaluate for any underlying evidence of osteomyelitis, also initiating broad-spectrum IV antibiotics.  He did not feel that making the patient n.p.o. at midnight was necessary at this time.  While in the ED, the following were administered: IV vancomycin and Zosyn.  Subsequently, the patient was admitted to the [place] floor for further evaluation management presenting infected left diabetic foot wound.     Review of Systems: As per HPI otherwise 10 point review  of systems negative.   Past Medical History:  Diagnosis Date  . Anemia   . Asthma   . Bipolar disorder ([place])   . Charcot foot due  to diabetes mellitus ([place])   . Depression   . Dermatitis seborrheica   . Diabetes mellitus without complication ([place])   . Eczema   . Hyperlipemia   . Hypertension   . Hypothyroidism   . Neuropathy   . Psoriasis   . Psychosis ([place])   . Respiratory failure ([place])   . Sleep apnea, obstructive   . Stroke [facility])    left hand weakness    Past Surgical History:  Procedure Laterality Date  . BONE EXCISION Left 09/21/2018   Procedure: Partial excision of metatarsal bone;  Surgeon: Samara Deist, DPM;  Location: [facility] ORS;  Service: Podiatry;  Laterality: Left;  . BONE EXOSTOSIS EXCISION Left 05/11/2018   Procedure: Excisional Debridment ulcer left foot;  Surgeon: Samara Deist, DPM;  Location: [facility] ORS;  Service: Podiatry;  Laterality: Left;  . bone removed     bone removed in foot due to infection   . TONSILLECTOMY      Social History:  reports that he quit smoking about 2 years ago. He smoked 0.25 packs per day. He has never used smokeless tobacco. He reports that he does not drink alcohol and does not use drugs.   No Known Allergies  Family History  Problem Relation Age of Onset  . Heart failure Mother   . Stroke Father       Prior to Admission medications   Medication Sig Start Date End Date Taking? Authorizing Provider  atorvastatin (LIPITOR) 40 MG tablet Take 40 mg by mouth at bedtime.    Yes [provider]  Azelastine HCl 137 MCG/SPRAY SOLN Place 1 spray into both nostrils 2 (two) times daily. (0800 & 2000) Use in each nostril as directed   Yes [provider]  cholecalciferol (VITAMIN D) 1000 units tablet Take 1,000 Units by mouth daily. (0800)   Yes [provider]  ferrous sulfate 325 (65 FE) MG tablet Take 325 mg by mouth 2 (two) times daily with a meal. (0800 & 2000)   Yes [provider]  fluticasone furoate-vilanterol (BREO ELLIPTA) 100-25 MCG/INH AEPB Inhale 1 puff into the lungs daily. (0800)   Yes [provider]   levothyroxine (SYNTHROID, LEVOTHROID) 75 MCG tablet Take 75 mcg by mouth daily before breakfast. (0700)   Yes [provider]  lisinopril (ZESTRIL) 10 MG tablet Take 10 mg by mouth daily. 12/01/20  Yes [provider]  metoprolol (LOPRESSOR) 100 MG tablet Take 1 tablet (100 mg total) by mouth 2 (two) times daily. Patient taking differently: Take 100 mg by mouth 2 (two) times daily. (0800 & 2000) 10/20/15  Yes Allie Bossier, MD  montelukast (SINGULAIR) 10 MG tablet Take 10 mg by mouth daily. (0800)   Yes [provider]  Multiple Vitamin (THEREMS PO) Take 1 tablet by mouth daily. (0800)   Yes [provider]  oxcarbazepine (TRILEPTAL) 600 MG tablet Take 600 mg by mouth 2 (two) times daily. (0800 & 2000)   Yes [provider]  pantoprazole (PROTONIX) 40 MG tablet Take 1 tablet (40 mg total) by mouth daily at 12 noon. 10/20/15  Yes Allie Bossier, MD  senna (SENOKOT) 8.6 MG tablet Take 1 tablet by mouth at bedtime as needed for constipation.   Yes [provider]  Tiotropium Bromide Monohydrate 2.5 MCG/ACT AERS Inhale 1  puff into the lungs daily. (0800)   Yes [provider]  triamcinolone cream (KENALOG) 0.1 % Apply 1 application topically 2 (two) times daily as needed (until rash clears). (0800 & 2000)   Yes [provider]  ziprasidone (GEODON) 40 MG capsule Take 40 mg by mouth 2 (two) times daily with a meal. (0800 & 2000) 09/07/15  Yes [provider]  zolpidem (AMBIEN) 5 MG tablet Take 5 mg by mouth at bedtime.   Yes [provider]  acetaminophen (TYLENOL) 325 MG tablet Take 650 mg by mouth every 4 (four) hours as needed for mild pain or fever.     [provider]  albuterol (PROVENTIL HFA;VENTOLIN HFA) 108 (90 Base) MCG/ACT inhaler Inhale 2 puffs into the lungs every 6 (six) hours as needed for wheezing or shortness of breath.    [provider]  alum & mag hydroxide-simeth (MAALOX/MYLANTA)  200-200-20 MG/5ML suspension Take 30 mLs by mouth as needed for indigestion or heartburn.     [provider]  bismuth subsalicylate (PEPTO BISMOL) 262 MG/15ML suspension Take 30 mLs by mouth every 6 (six) hours as needed for indigestion.     [provider]  lisinopril (PRINIVIL,ZESTRIL) 2.5 MG tablet Take 2.5 mg by mouth daily. (0800)    [provider]  loperamide (IMODIUM A-D) 2 MG tablet Take 2-4 mg by mouth 4 (four) times daily as needed for diarrhea or loose stools.     [provider]  magnesium hydroxide (MILK OF MAGNESIA) 400 MG/5ML suspension Take 15-30 mLs by mouth daily as needed for mild constipation.     [provider]  nystatin (MYCOSTATIN/NYSTOP) powder Apply 1 g topically 2 (two) times daily as needed (FOR FUNGAL RASH). Under abdomen/groin    [provider]     Objective    Physical Exam: Vitals:   12/17/20 1600 12/17/20 1730 12/17/20 1753 12/17/20 2101  BP: (!) 106/52 112/63 103/62 119/65  Pulse: (!) 59 68 66 65  Resp: '18 18 16 16  ' Temp:  98.2 F (36.8 C) 98.4 F (36.9 C) 98.7 F (37.1 C)  TempSrc:  Oral Oral Oral  SpO2: 99% 99% 100% 96%  Weight:      Height:        General: appears to be stated age; alert, oriented Skin: warm; erythema, tenderness, swelling, purulent discharge noted to be associated with plantar lesion of the left midfoot Head:  AT/[place] Mouth:  Oral mucosa membranes appear moist, normal dentition Neck: supple; trachea midline Heart:  RRR; did not appreciate any M/R/G Lungs: CTAB, did not appreciate any wheezes, rales, or rhonchi Abdomen: + BS; soft, ND, NT Vascular: 2+ pedal pulses b/l; 2+ radial pulses b/l Extremities: no muscle wasting; erythema, tenderness, increased warmth, swelling associated with wound on the plantar surface of the left midfoot, as further described above. Neuro: strength and sensation intact in upper and lower extremities b/l    Labs on Admission: I have  personally reviewed following labs and imaging studies  CBC: Recent Labs  Lab 12/17/20 1249  WBC 8.4  NEUTROABS 5.6  HGB 12.4*  HCT 36.8*  MCV 90.4  PLT 177   Basic Metabolic Panel: Recent Labs  Lab 12/17/20 1249  NA 142  K 4.5  CL 107  CO2 24  GLUCOSE 101*  BUN 17  CREATININE 1.03  CALCIUM 8.9  MG 2.1   GFR: Estimated Creatinine Clearance: 94.9 mL/min (by C-G formula based on SCr of 1.03 mg/dL). Liver Function Tests: Recent  Labs  Lab 12/17/20 1249  AST 17  ALT 16  ALKPHOS 149*  BILITOT 0.5  PROT 7.4  ALBUMIN 4.0   No results for input(s): LIPASE, AMYLASE in the last 168 hours. No results for input(s): AMMONIA in the last 168 hours. Coagulation Profile: No results for input(s): INR, PROTIME in the last 168 hours. Cardiac Enzymes: No results for input(s): CKTOTAL, CKMB, CKMBINDEX, TROPONINI in the last 168 hours. BNP (last 3 results) No results for input(s): PROBNP in the last 8760 hours. HbA1C: No results for input(s): HGBA1C in the last 72 hours. CBG: Recent Labs  Lab 12/17/20 1801 12/17/20 2302  GLUCAP 71 121*   Lipid Profile: No results for input(s): CHOL, HDL, LDLCALC, TRIG, CHOLHDL, LDLDIRECT in the last 72 hours. Thyroid Function Tests: No results for input(s): TSH, T4TOTAL, FREET4, T3FREE, THYROIDAB in the last 72 hours. Anemia Panel: No results for input(s): VITAMINB12, FOLATE, FERRITIN, TIBC, IRON, RETICCTPCT in the last 72 hours. Urine analysis:    Component Value Date/Time   COLORURINE YELLOW (A) 12/17/2020 1551   APPEARANCEUR TURBID (A) 12/17/2020 1551   APPEARANCEUR Clear 04/25/2014 1246   LABSPEC 1.008 12/17/2020 1551   LABSPEC 1.004 04/25/2014 1246   PHURINE 6.0 12/17/2020 1551   GLUCOSEU NEGATIVE 12/17/2020 1551   GLUCOSEU Negative 04/25/2014 1246   HGBUR NEGATIVE 12/17/2020 1551   BILIRUBINUR NEGATIVE 12/17/2020 1551   BILIRUBINUR Negative 04/25/2014 1246   KETONESUR NEGATIVE 12/17/2020 1551   PROTEINUR NEGATIVE 12/17/2020  1551   UROBILINOGEN 0.2 01/16/2014 0207   NITRITE NEGATIVE 12/17/2020 1551   LEUKOCYTESUR LARGE (A) 12/17/2020 1551   LEUKOCYTESUR Trace 04/25/2014 1246    Radiological Exams on Admission: MR FOOT LEFT W WO CONTRAST  Result Date: 12/17/2020 CLINICAL DATA:  For osteomyelitis for for for EXAM: MRI OF THE LEFT FOREFOOT WITHOUT AND WITH CONTRAST TECHNIQUE: Multiplanar, multisequence MR imaging of the left was performed both before and after administration of intravenous contrast. CONTRAST:  33m GADAVIST GADOBUTROL 1 MMOL/ML IV SOLN COMPARISON:  December 10, 2018 FINDINGS: Bones/Joint/Cartilage There is a area of ulceration seen overlying the plantar lateral aspect of the midfoot measuring 1.3 cm in length with underlying significant edema and possible sinus tract to the cuboid. There is chronic cortical irregularity of the cuboid. However there is increased marrow signal with subtle T1 hypointensity seen at the inferior surface of the cuboid. No definite loculated fluid collections are seen. 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 No large joint effusion is noted. There is chronic partial resection at the base of the fifth metatarsal Ligaments Suboptimally visualized Muscles and Tendons Diffusely increased signal with fatty atrophy of the muscles is noted. The visualized portion of the tendons appear to be intact. Soft tissues As described above area of ulceration on the plantar lateral aspect of the midfoot. There is heel pad inflammation is subcutaneous edema. IMPRESSION: Area of ulceration along the plantar lateral aspect of the midfoot with a possible sinus tract to the underlying cuboid. There is findings of acute on chronic osteomyelitis involving the cuboid. No definite loculated fluid collections. 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 Electronically Signed   By: BPrudencio PairM.D.   On: 12/17/2020 22:49      Assessment/Plan    60y.o. male with medical  history significant for type 2 diabetes mellitus associated peripheral polyneuropathy and most recent hemoglobin A1c of 5.1%, acquired hypothyroidism, hypertension, moderate persistent asthma, bipolar disorder, [facility] with  infected left diabetic foot wound, after presenting from home to [facility] ED complaining of left foot pain.    Principal Problem:   Diabetic foot ulcer ([place]) Active Problems:   Asthma, chronic   Essential hypertension, benign   Bipolar I disorder (HCC)   Hyperlipidemia, unspecified   DM2 (diabetes mellitus, type 2) ([place])    #) Infected left diabetic foot wound: Progressive erythema, tenderness, swelling, increased warmth associated with wound on the plantar surface of the left foot, with progression of these features over the last few weeks in spite of compliantly following up in podiatry clinic, as above, prompting patient's podiatrist, Dr.**Recommend the patient present to [facility] ED today for further evaluation/management of the above.  Dr.Fowler Has been formally consulted, confirms that he will see the patient.  He recommends pursuit of MRI of the left foot to further evaluate for underlying osteomyelitis as well as initiation of broad-spectrum IV antibiotics.  Of note, no SIRS criteria are met at this time, and therefore criteria are not met for sepsis at present.  No clinical or physical exam findings to suggest necrotizing fasciitis. Received IV vancomycin and Zosyn in the ED today.   Plan: Continue IV vancomycin, and initiate Unasyn, which also provide anaerobic coverage for this infected left diabetic foot wound.  Check blood cultures x2.  Repeat CBC with differential in the morning.  Check MRI of the left foot, per recommendation of podiatry consultation, as above.  Add on CRP and ESR to further evaluate for osteomyelitis.  As needed IV fentanyl.  Formal podiatry consult, as above.     #) Asymptomatic pyuria:  Presenting urinalysis shows evidence of greater than 50 white blood cells, but with rare bacteria, all in the context of the patient denying any recent acute urinary symptoms.  This constellation of lab findings and absence of symptoms appears consistent with asymptomatic pyuria.  Therefore, will not initiate antibiotic coverage specific to treat this pyuria at the present time.  Rather, we will add on urine culture, and continue to monitor the patient clinically for evidence of development of acute urinary symptoms.   Plan: Add on urine culture.  Monitor results blood cultures x2.  Refrain from antibiotic treatment for the specific purpose of UTI coverage.  Repeat CBC with differential in the morning.      #) Type 2 diabetes mellitus: Well controlled as an outpatient with most recent hemoglobin A1c noted to be 5.1% when checked during December 2021.  Has been complicated by peripheral polyneuropathy.  The patient has recently been managed conservatively with lifestyle modifications, and is currently not on any insulin or oral hypoglycemic agents at home.  Presenting blood sugar noted to be 101.  Plan: Accu-Cheks before every meal and at bedtime with low-dose sliding scale insulin.     #) Acquired hypothyroidism: On Synthroid as an outpatient.  Plan: Continue home dose of Synthroid.      #) History of essential hypertension: On the following antihypertensive medications at home: Lopressor, lisinopril.  Presenting systolic blood pressures noted to be in the range of 100 - 120 mmHg.  Consequently, and in the setting of presenting underlying infectious process, will hold home antihypertensive medications for now, close monitoring some blood pressures.  Plan: Hold home antihypertensive medications for now.  Close monitoring of ensuing blood pressures via routine vital signs.     #) Hyperlipidemia: On atorvastatin as an outpatient.  Plan: Continue home atorvastatin.     #) Moderate  persistent asthma: Outpatient respiratory regimen consists of  tiotropium, Breo Ellipta, Singulair, and as needed albuterol inhaler.  No clinical evidence to suggest acute exacerbation of underlying asthma at this time.  Plan: Continue home respiratory regimen.  Add on serum magnesium and phosphorus levels.      #) GERD: On Protonix as an outpatient.  Plan: Continue home PPI.      #) Bipolar disorder: On scheduled Geodon as an outpatient.  Plan: Continue home scheduled Geodon.      DVT prophylaxis: scd's  Code Status: Full code Family Communication: none Disposition Plan: Per Rounding Team Consults called: Dr. Vickki Muff of podiatry has been formally consulted, as further described above Admission status: Inpatient, [place].     Of note, this patient was added by me to the following Admit List/Treatment Team: armcadmits.      PLEASE NOTE THAT DRAGON DICTATION SOFTWARE WAS USED IN THE CONSTRUCTION OF THIS NOTE.   [facility] Pager [phone] From 12PM - 12AM  Otherwise, please contact night-coverage  [email or web address] Password [facility]   12/17/2020, 11:34 PM

## 2020-12-17 NOTE — Progress Notes (Signed)
PHARMACY -  BRIEF ANTIBIOTIC NOTE   Pharmacy has received consult(s) for vancomycin from an ED provider.  The patient's profile has been reviewed for ht/wt/allergies/indication/available labs.    One time order(s) placed for vancomycin 2000 mg IV x 1  Further antibiotics/pharmacy consults should be ordered by admitting physician if indicated.                       Thank you, Lowella Bandy 12/17/2020  2:26 PM

## 2020-12-17 NOTE — ED Triage Notes (Signed)
Pt comes into the ED via POV sent by Dr. Ether Griffins for possible foot wound infection.  Pt has known diabetic wound, but it seems to be increasing in size and drainage.  PT states the foot is swollen more in general.  Pt in NAD at this time with even and unlabored respirations.

## 2020-12-17 NOTE — ED Provider Notes (Signed)
[facility] Emergency Department Provider Note    Event Date/Time   First MD Initiated Contact with Patient 12/17/20 1414     (approximate)  I have reviewed the triage vital signs and the nursing notes.   HISTORY  Chief Complaint Wound Infection    HPI Adrian [name]. is a 60 y.o. male below listed past medical history presents to the ER for evaluation of worsening left foot wound.  Patient sent over from podiatry clinic due to worsening necrosis swelling and pain.  Previously had been managed well and was showing good signs of healing but over the past little bit has had significant and rapid change.  No fevers.  Patient is diabetic.  Denies any interval trauma.  Sent to the ER for IV antibiotics    Past Medical History:  Diagnosis Date  . Anemia   . Asthma   . Bipolar disorder (HCC)   . Charcot foot due to diabetes mellitus (HCC)   . Depression   . Dermatitis seborrheica   . Diabetes mellitus without complication (HCC)   . Eczema   . Hyperlipemia   . Hypertension   . Hypothyroidism   . Neuropathy   . Psoriasis   . Psychosis (HCC)   . Respiratory failure (HCC)   . Sleep apnea, obstructive   . Stroke [facility])    left hand weakness   Family History  Problem Relation Age of Onset  . Heart failure Mother   . Stroke Father    Past Surgical History:  Procedure Laterality Date  . BONE EXCISION Left 09/21/2018   Procedure: Partial excision of metatarsal bone;  Surgeon: Gwyneth Revels, DPM;  Location: [facility] ORS;  Service: Podiatry;  Laterality: Left;  . BONE EXOSTOSIS EXCISION Left 05/11/2018   Procedure: Excisional Debridment ulcer left foot;  Surgeon: Gwyneth Revels, DPM;  Location: [facility] ORS;  Service: Podiatry;  Laterality: Left;  . bone removed     bone removed in foot due to infection   . TONSILLECTOMY     Patient Active Problem List   Diagnosis Date Noted  . Diabetic foot infection (HCC) 09/25/2017  . Foot pain 09/24/2017  . Acute  osteomyelitis of left foot (HCC) 09/23/2017  . Left hand weakness 07/05/2016  . Numbness and tingling 07/05/2016  . Hypernatremia   . Candidal pneumonia (HCC)   . Abdominal wall cellulitis   . Bacteremia due to Streptococcus   . Dysphagia   . OSA (obstructive sleep apnea)   . Obesity hypoventilation syndrome (HCC)   . Bipolar I disorder (HCC)   . Other specified hypothyroidism   . Tracheostomy care (HCC)   . C. difficile colitis   . Acute respiratory failure with hypoxia (HCC)   . Encounter for imaging study to confirm nasogastric (NG) tube placement   . History of ETT   . Acute respiratory failure (HCC)   . ARDS (adult respiratory distress syndrome) (HCC)   . Acute renal failure (HCC)   . Respiratory failure (HCC) 10/04/2015  . Sepsis (HCC) 10/02/2015  . Acute kidney injury (HCC) 09/23/2014  . Allergic rhinitis, cause unspecified 02/24/2014  . Unspecified constipation 02/24/2014  . Essential hypertension, benign 02/24/2014  . UTI (lower urinary tract infection) 01/16/2014  . Altered mental status 01/16/2014  . Diabetic osteomyelitis (HCC) 01/03/2014  . Diabetic neuropathy (HCC) 01/03/2014  . Obstructive sleep apnea 01/03/2014  . Asthma, chronic 01/03/2014  . Seborrheic dermatitis of scalp 01/03/2014  . Morbid obesity (HCC) 01/03/2014  . HTN (hypertension)  10/05/2013  . Hyperlipidemia, unspecified 10/05/2013  . Osteomyelitis (HCC) 10/05/2013      Prior to Admission medications   Medication Sig Start Date End Date Taking? Authorizing Provider  acetaminophen (TYLENOL) 325 MG tablet Take 650 mg by mouth every 4 (four) hours as needed for mild pain or fever.     [provider]  albuterol (PROVENTIL HFA;VENTOLIN HFA) 108 (90 Base) MCG/ACT inhaler Inhale 2 puffs into the lungs every 6 (six) hours as needed for wheezing or shortness of breath.    [provider]  alum & mag hydroxide-simeth (GERI-LANTA) 200-200-20 MG/5ML suspension Take 30 mLs by mouth as  needed for indigestion or heartburn.     [provider]  atorvastatin (LIPITOR) 40 MG tablet Take 40 mg by mouth at bedtime.     [provider]  Azelastine HCl 137 MCG/SPRAY SOLN Place 1 spray into both nostrils 2 (two) times daily. (0800 & 2000) Use in each nostril as directed     [provider]  bismuth subsalicylate (PEPTO BISMOL) 262 MG/15ML suspension Take 30 mLs by mouth every 6 (six) hours as needed for indigestion.     [provider]  cholecalciferol (VITAMIN D) 1000 units tablet Take 1,000 Units by mouth daily. (0800)    [provider]  ferrous sulfate 325 (65 FE) MG tablet Take 325 mg by mouth 2 (two) times daily with a meal. (0800 & 2000)    [provider]  fluticasone furoate-vilanterol (BREO ELLIPTA) 100-25 MCG/INH AEPB Inhale 1 puff into the lungs daily. (0800)    [provider]  guaifenesin (ROBITUSSIN) 100 MG/5ML syrup Take 200 mg by mouth 3 (three) times daily as needed for cough.    [provider]  HYDROcodone-acetaminophen (NORCO) 5-325 MG tablet Take 1 tablet by mouth every 6 (six) hours as needed for moderate pain. 05/11/18   Gwyneth Revels, DPM  levothyroxine (SYNTHROID, LEVOTHROID) 75 MCG tablet Take 75 mcg by mouth daily before breakfast. (0700)    [provider]  lisinopril (PRINIVIL,ZESTRIL) 2.5 MG tablet Take 2.5 mg by mouth daily. (0800)    [provider]  loperamide (IMODIUM A-D) 2 MG tablet Take 2-4 mg by mouth 4 (four) times daily as needed for diarrhea or loose stools.     [provider]  magnesium hydroxide (MILK OF MAGNESIA) 400 MG/5ML suspension Take 15-30 mLs by mouth daily as needed for mild constipation.     [provider]  metoprolol (LOPRESSOR) 100 MG tablet Take 1 tablet (100 mg total) by mouth 2 (two) times daily. Patient taking differently: Take 100 mg by mouth 2 (two) times daily. (0800 & 2000) 10/20/15   Drema Dallas, MD  montelukast  (SINGULAIR) 10 MG tablet Take 10 mg by mouth daily. (0800)    [provider]  Multiple Vitamin (THEREMS PO) Take 1 tablet by mouth daily. (0800)    [provider]  nystatin (NYSTATIN) powder Apply 1 g topically 2 (two) times daily as needed (FOR FUNGAL RASH). Under abdomen/groin    [provider]  oxcarbazepine (TRILEPTAL) 600 MG tablet Take 600 mg by mouth 2 (two) times daily. (0800 & 2000)    [provider]  oxyCODONE (ROXICODONE) 5 MG immediate release tablet Take 1 tablet (5 mg total) by mouth every 4 (four) hours as needed for severe pain. 09/21/18   Gwyneth Revels, DPM  pantoprazole (PROTONIX) 40 MG tablet Take 1 tablet (40 mg total) by mouth daily at 12 noon. 10/20/15  Drema DallasWoods, Curtis J, MD  senna (SENOKOT) 8.6 MG tablet Take 1 tablet by mouth at bedtime as needed for constipation.    [provider]  Tiotropium Bromide Monohydrate (SPIRIVA RESPIMAT) 2.5 MCG/ACT AERS Inhale 1 puff into the lungs daily. (0800)    [provider]  triamcinolone cream (KENALOG) 0.1 % Apply 1 application topically 2 (two) times daily as needed (until rash clears). (0800 & 2000)    [provider]  ziprasidone (GEODON) 40 MG capsule Take 40 mg by mouth 2 (two) times daily with a meal. (0800 & 2000) 09/07/15   [provider]  zolpidem (AMBIEN) 5 MG tablet Take 5 mg by mouth at bedtime.    [provider]    Allergies Patient has no known allergies.    Social History Social History   Tobacco Use  . Smoking status: Former Smoker    Packs/day: 0.25    Quit date: 03/03/2018    Years since quitting: 2.7  . Smokeless tobacco: Never Used  Vaping Use  . Vaping Use: Never used  Substance Use Topics  . Alcohol use: No  . Drug use: No    Review of Systems Patient denies headaches, rhinorrhea, blurry vision, numbness, shortness of breath, chest pain, edema, cough, abdominal pain, nausea, vomiting, diarrhea, dysuria, fevers,  rashes or hallucinations unless otherwise stated above in HPI. ____________________________________________   PHYSICAL EXAM:  VITAL SIGNS: Vitals:   12/17/20 1247  BP: 121/72  Pulse: 65  Resp: 18  Temp: 97.9 F (36.6 C)  SpO2: 98%    Constitutional: Alert and oriented.  Eyes: Conjunctivae are normal.  Head: Atraumatic. Nose: No congestion/rhinnorhea. Mouth/Throat: Mucous membranes are moist.   Neck: No stridor. Painless ROM.  Cardiovascular: Normal rate, regular rhythm. Grossly normal heart sounds.  Good peripheral circulation. Respiratory: Normal respiratory effort.  No retractions. Lungs CTAB. Gastrointestinal: Soft and nontender. No distention. No abdominal bruits. No CVA tenderness. Genitourinary:  Musculoskeletal: No lower extremity tenderness nor edema there is swelling warmth and gangrenous changes of midfoot of left foot.  Foul odor..  No joint effusions. Neurologic:  Normal speech and language. No gross focal neurologic deficits are appreciated. No facial droop Skin:  Skin is warm, dry and intact. No rash noted. Psychiatric: Mood and affect are normal. Speech and behavior are normal.  ____________________________________________   LABS (all labs ordered are listed, but only abnormal results are displayed)  Results for orders placed or performed during the hospital encounter of 12/17/20 (from the past 24 hour(s))  Lactic acid, plasma     Status: None   Collection Time: 12/17/20 12:49 PM  Result Value Ref Range   Lactic Acid, Venous 1.1 0.5 - 1.9 mmol/L  Comprehensive metabolic panel     Status: Abnormal   Collection Time: 12/17/20 12:49 PM  Result Value Ref Range   Sodium 142 135 - 145 mmol/L   Potassium 4.5 3.5 - 5.1 mmol/L   Chloride 107 98 - 111 mmol/L   CO2 24 22 - 32 mmol/L   Glucose, Bld 101 (H) 70 - 99 mg/dL   BUN 17 6 - 20 mg/dL   Creatinine, Ser 2.841.03 0.61 - 1.24 mg/dL   Calcium 8.9 8.9 - 13.210.3 mg/dL   Total Protein 7.4 6.5 - 8.1 g/dL   Albumin 4.0  3.5 - 5.0 g/dL   AST 17 15 - 41 U/L   ALT 16 0 - 44 U/L   Alkaline Phosphatase 149 (H) 38 - 126 U/L   Total Bilirubin 0.5  0.3 - 1.2 mg/dL   GFR, Estimated >22 >02 mL/min   Anion gap 11 5 - 15  CBC with Differential     Status: Abnormal   Collection Time: 12/17/20 12:49 PM  Result Value Ref Range   WBC 8.4 4.0 - 10.5 K/uL   RBC 4.07 (L) 4.22 - 5.81 MIL/uL   Hemoglobin 12.4 (L) 13.0 - 17.0 g/dL   HCT 54.2 (L) 70.6 - 23.7 %   MCV 90.4 80.0 - 100.0 fL   MCH 30.5 26.0 - 34.0 pg   MCHC 33.7 30.0 - 36.0 g/dL   RDW 62.8 31.5 - 17.6 %   Platelets 264 150 - 400 K/uL   nRBC 0.0 0.0 - 0.2 %   Neutrophils Relative % 66 %   Neutro Abs 5.6 1.7 - 7.7 K/uL   Lymphocytes Relative 23 %   Lymphs Abs 1.9 0.7 - 4.0 K/uL   Monocytes Relative 8 %   Monocytes Absolute 0.7 0.1 - 1.0 K/uL   Eosinophils Relative 2 %   Eosinophils Absolute 0.2 0.0 - 0.5 K/uL   Basophils Relative 0 %   Basophils Absolute 0.0 0.0 - 0.1 K/uL   Immature Granulocytes 1 %   Abs Immature Granulocytes 0.04 0.00 - 0.07 K/uL   ____________________________________________ ________________________________  RADIOLOGY  I personally reviewed all radiographic images ordered to evaluate for the above acute complaints and reviewed radiology reports and findings.  These findings were personally discussed with the patient.  Please see medical record for radiology report.  ____________________________________________   PROCEDURES  Procedure(s) performed:  Procedures    Critical Care performed: no ____________________________________________   INITIAL IMPRESSION / ASSESSMENT AND PLAN / ED COURSE  Pertinent labs & imaging results that were available during my care of the patient were reviewed by me and considered in my medical decision making (see chart for details).   DDX: Diabetic foot wound, cellulitis, abscess, osteomyelitis  Adrian Ferrier Manasseh Pittsley. is a 60 y.o. who presents to the ED with presentation as described above.   Patient nontoxic-appearing but with comorbidities with worsening diabetic foot wound sent over from podiatry clinic for MRI as well as admission for IV antibiotics.  Discussed case with hospitalist.  I have ordered IV antibiotics.  Patient agreeable to plan.     The patient was evaluated in Emergency Department today for the symptoms described in the history of present illness. He/she was evaluated in the context of the global COVID-19 pandemic, which necessitated consideration that the patient might be at risk for infection with the SARS-CoV-2 virus that causes COVID-19. Institutional protocols and algorithms that pertain to the evaluation of patients at risk for COVID-19 are in a state of rapid change based on information released by regulatory bodies including the CDC and federal and state organizations. These policies and algorithms were followed during the patient's care in the ED.  As part of my medical decision making, I reviewed the following data within the electronic MEDICAL RECORD NUMBER Nursing notes reviewed and incorporated, Labs reviewed, notes from prior ED visits and [place] Controlled Substance Database   ____________________________________________   FINAL CLINICAL IMPRESSION(S) / ED DIAGNOSES  Final diagnoses:  Diabetic ulcer of left midfoot associated with type 2 diabetes mellitus, with other ulcer severity (HCC)      NEW MEDICATIONS STARTED DURING THIS VISIT:  New Prescriptions   No medications on file     Note:  This document was prepared using Dragon voice recognition software and may include unintentional dictation errors.  Willy Eddy, MD 12/17/20 [phone]

## 2020-12-17 NOTE — ED Notes (Signed)
Phone given to patient for MRI screen

## 2020-12-17 NOTE — ED Notes (Signed)
Lab called for blood cultures due to difficult IV placement and blood draw

## 2020-12-17 NOTE — Progress Notes (Signed)
Pharmacy Antibiotic Note  Adrian [name]. is a 60 y.o. male admitted on 12/17/2020 with worsening necrosis swelling and pain of the left foot.  Pharmacy has been consulted for vancomycin and Unasyn dosing.  Plan:  1) vancomycin 1000 mg IV Q 12 hrs following 2000 mg loading dose  Goal AUC 400-550  Expected AUC: 527.8  SCr used: 1.03 mg/dL  Ke: 1.660 h-1, Y3/0: 9.5 h  Css (expected): 33.0 / 14.8 mcg/mL  Daily renal function assessment while on IV vancomycin  Levels as clinically indicated  2) start Unasyn 3 grams IV every 6 hours  Height: 5\' 11"  (180.3 cm) Weight: 104.3 kg (230 lb) IBW/kg (Calculated) : 75.3  Temp (24hrs), Avg:97.9 F (36.6 C), Min:97.9 F (36.6 C), Max:97.9 F (36.6 C)  Recent Labs  Lab 12/17/20 1249  WBC 8.4  CREATININE 1.03  LATICACIDVEN 1.1    Estimated Creatinine Clearance: 94.9 mL/min (by C-G formula based on SCr of 1.03 mg/dL).    No Known Allergies  Antimicrobials this admission: 03/03 vancomycin >>  03/03 Unasyn >>   Microbiology results: 03/03 BCx: pending 03/03 UCx: pending 03/03 SARS CoV-2: pending   Thank you for allowing pharmacy to be a part of this patient's care.  05/03 12/17/2020 4:54 PM

## 2020-12-18 DIAGNOSIS — L97428 Non-pressure chronic ulcer of left heel and midfoot with other specified severity: Secondary | ICD-10-CM | POA: Diagnosis not present

## 2020-12-18 DIAGNOSIS — L97429 Non-pressure chronic ulcer of left heel and midfoot with unspecified severity: Secondary | ICD-10-CM

## 2020-12-18 DIAGNOSIS — E119 Type 2 diabetes mellitus without complications: Secondary | ICD-10-CM | POA: Diagnosis not present

## 2020-12-18 DIAGNOSIS — E11621 Type 2 diabetes mellitus with foot ulcer: Secondary | ICD-10-CM | POA: Diagnosis not present

## 2020-12-18 DIAGNOSIS — J4540 Moderate persistent asthma, uncomplicated: Secondary | ICD-10-CM | POA: Diagnosis not present

## 2020-12-18 DIAGNOSIS — F319 Bipolar disorder, unspecified: Secondary | ICD-10-CM | POA: Diagnosis not present

## 2020-12-18 LAB — CBC WITH DIFFERENTIAL/PLATELET
Abs Immature Granulocytes: 0.02 10*3/uL (ref 0.00–0.07)
Basophils Absolute: 0 10*3/uL (ref 0.0–0.1)
Basophils Relative: 0 %
Eosinophils Absolute: 0.2 10*3/uL (ref 0.0–0.5)
Eosinophils Relative: 2 %
HCT: 35.1 % — ABNORMAL LOW (ref 39.0–52.0)
Hemoglobin: 12.1 g/dL — ABNORMAL LOW (ref 13.0–17.0)
Immature Granulocytes: 0 %
Lymphocytes Relative: 19 %
Lymphs Abs: 1.6 10*3/uL (ref 0.7–4.0)
MCH: 31.1 pg (ref 26.0–34.0)
MCHC: 34.5 g/dL (ref 30.0–36.0)
MCV: 90.2 fL (ref 80.0–100.0)
Monocytes Absolute: 0.7 10*3/uL (ref 0.1–1.0)
Monocytes Relative: 9 %
Neutro Abs: 5.8 10*3/uL (ref 1.7–7.7)
Neutrophils Relative %: 70 %
Platelets: 240 10*3/uL (ref 150–400)
RBC: 3.89 MIL/uL — ABNORMAL LOW (ref 4.22–5.81)
RDW: 13.5 % (ref 11.5–15.5)
WBC: 8.3 10*3/uL (ref 4.0–10.5)
nRBC: 0 % (ref 0.0–0.2)

## 2020-12-18 LAB — MAGNESIUM: Magnesium: 1.9 mg/dL (ref 1.7–2.4)

## 2020-12-18 LAB — BASIC METABOLIC PANEL
Anion gap: 9 (ref 5–15)
BUN: 16 mg/dL (ref 6–20)
CO2: 25 mmol/L (ref 22–32)
Calcium: 8.8 mg/dL — ABNORMAL LOW (ref 8.9–10.3)
Chloride: 110 mmol/L (ref 98–111)
Creatinine, Ser: 0.9 mg/dL (ref 0.61–1.24)
GFR, Estimated: >60 mL/min (ref >60)
Glucose, Bld: 93 mg/dL (ref 70–99)
Potassium: 4 mmol/L (ref 3.5–5.1)
Sodium: 144 mmol/L (ref 135–145)

## 2020-12-18 LAB — GLUCOSE, CAPILLARY
Glucose-Capillary: 76 mg/dL (ref 70–99)
Glucose-Capillary: 87 mg/dL (ref 70–99)
Glucose-Capillary: 121 mg/dL — ABNORMAL HIGH (ref 70–99)

## 2020-12-18 LAB — PROTIME-INR
INR: 1.1 (ref 0.8–1.2)
Prothrombin Time: 13.9 seconds (ref 11.4–15.2)

## 2020-12-18 LAB — HIV ANTIBODY (ROUTINE TESTING W REFLEX): HIV Screen 4th Generation wRfx: NONREACTIVE

## 2020-12-18 LAB — SARS CORONAVIRUS 2 (TAT 6-24 HRS): SARS Coronavirus 2: NEGATIVE

## 2020-12-18 LAB — C-REACTIVE PROTEIN: CRP: 4.9 mg/dL — ABNORMAL HIGH (ref <1.0)

## 2020-12-18 MED ORDER — ENSURE MAX PROTEIN PO LIQD
11.0000 [oz_av] | Freq: Two times a day (BID) | ORAL | Status: DC
  Administered 2020-12-18 – 2020-12-30 (×22): 11 [oz_av] via ORAL
  Filled 2020-12-18 (×25): qty 330

## 2020-12-18 MED ORDER — VANCOMYCIN HCL 1250 MG/250ML IV SOLN
1250.0000 mg | Freq: Two times a day (BID) | INTRAVENOUS | Status: DC
  Administered 2020-12-18 – 2020-12-21 (×6): 1250 mg via INTRAVENOUS
  Filled 2020-12-18 (×9): qty 250

## 2020-12-18 MED ORDER — ADULT MULTIVITAMIN W/MINERALS CH
1.0000 | ORAL_TABLET | Freq: Every day | ORAL | Status: DC
  Administered 2020-12-19 – 2020-12-30 (×12): 1 via ORAL
  Filled 2020-12-18 (×12): qty 1

## 2020-12-18 NOTE — Progress Notes (Signed)
Pharmacy Antibiotic Note  Adrian Can. is a 60 y.o. male admitted on 12/17/2020 with worsening necrosis swelling and pain of the left foot.   Pharmacy has been consulted for vancomycin and Unasyn dosing.  Plan:  1) Will increase vancomycin to 1250 mg IV Q 12 hrs   Goal AUC 400-550  Expected AUC: 455  SCr used: 0.90 mg/dL (from 1.15)  Ke: 7.262 h-1, T1/2: 8.4 h  Css (expected): 29.9 / 12.6 mcg/mL  Daily renal function assessment while on IV vancomycin  Levels as clinically indicated  2) Continue Unasyn 3 grams IV every 6 hours  Height: 5\' 11"  (180.3 cm) Weight: 133.1 kg (293 lb 6.9 oz) IBW/kg (Calculated) : 75.3  Temp (24hrs), Avg:98.1 F (36.7 C), Min:97.7 F (36.5 C), Max:98.7 F (37.1 C)  Recent Labs  Lab 12/17/20 1249 12/18/20 0451  WBC 8.4 8.3  CREATININE 1.03 0.90  LATICACIDVEN 1.1  --     Estimated Creatinine Clearance: 123 mL/min (by C-G formula based on SCr of 0.9 mg/dL).    No Known Allergies  Antimicrobials this admission: 03/03 vancomycin >>  03/03 Unasyn >>   Microbiology results: 03/03 BCx: pending 03/03 UCx: pending 03/03 SARS CoV-2: pending   Thank you for allowing pharmacy to be a part of this patient's care.  05/03, PharmD, BCPS Clinical Pharmacist 12/18/2020 9:02 AM

## 2020-12-18 NOTE — Progress Notes (Signed)
   12/18/20 1035  Clinical Encounter Type  Visited With Patient  Visit Type Initial;Spiritual support;Social support  Referral From Nurse  Consult/Referral To Chaplain  Ch visited with Pt per OR. The OR was for prayer. The Pt said he has his own preacher and did not need the Ch.

## 2020-12-18 NOTE — Consult Note (Signed)
[facility] Vascular Consult Note  MRN : [id number]  Adrian DiegoCarlton B Horn Jr. is a 60 y.o. ([birth year]) male who presents with chief complaint of  Chief Complaint  Patient presents with  . Wound Infection  .  History of Present Illness:   I am asked to evaluate Adrian [name] by Dr. Ether GriffinsFowler.  Adrian [name] is a 60 year old gentleman who has struggled with a Charcot foot deformity for many many years.  In fact the first thing he said to me after I introduced myself he said "I been dealing with this for so long the [facility] me to have an amputation 15 years ago".  More recently Dr. Ether GriffinsFowler has been taking care of him.  He has had multiple operations in an attempt to debride and achieve wound healing but these have not worked.  He presented to [facility] yesterday with worsening necrosis swelling and pain of his left foot.  At this point I have been asked to evaluate for amputation.  Current Meds  Medication Sig  . atorvastatin (LIPITOR) 40 MG tablet Take 40 mg by mouth at bedtime.   . Azelastine HCl 137 MCG/SPRAY SOLN Place 1 spray into both nostrils 2 (two) times daily. (0800 & 2000) Use in each nostril as directed  . cholecalciferol (VITAMIN D) 1000 units tablet Take 1,000 Units by mouth daily. (0800)  . ferrous sulfate 325 (65 FE) MG tablet Take 325 mg by mouth 2 (two) times daily with a meal. (0800 & 2000)  . fluticasone furoate-vilanterol (BREO ELLIPTA) 100-25 MCG/INH AEPB Inhale 1 puff into the lungs daily. (0800)  . levothyroxine (SYNTHROID, LEVOTHROID) 75 MCG tablet Take 75 mcg by mouth daily before breakfast. (0700)  . lisinopril (ZESTRIL) 10 MG tablet Take 10 mg by mouth daily.  . metoprolol (LOPRESSOR) 100 MG tablet Take 1 tablet (100 mg total) by mouth 2 (two) times daily. (Patient taking differently: Take 100 mg by mouth 2 (two) times daily. (0800 & 2000))  . montelukast (SINGULAIR) 10 MG tablet Take 10 mg by mouth daily. (0800)  .  Multiple Vitamin (THEREMS PO) Take 1 tablet by mouth daily. (0800)  . oxcarbazepine (TRILEPTAL) 600 MG tablet Take 600 mg by mouth 2 (two) times daily. (0800 & 2000)  . pantoprazole (PROTONIX) 40 MG tablet Take 1 tablet (40 mg total) by mouth daily at 12 noon.  . senna (SENOKOT) 8.6 MG tablet Take 1 tablet by mouth at bedtime as needed for constipation.  . Tiotropium Bromide Monohydrate 2.5 MCG/ACT AERS Inhale 1 puff into the lungs daily. (0800)  . triamcinolone cream (KENALOG) 0.1 % Apply 1 application topically 2 (two) times daily as needed (until rash clears). (0800 & 2000)  . ziprasidone (GEODON) 40 MG capsule Take 40 mg by mouth 2 (two) times daily with a meal. (0800 & 2000)  . zolpidem (AMBIEN) 5 MG tablet Take 5 mg by mouth at bedtime.    Past Medical History:  Diagnosis Date  . Anemia   . Asthma   . Bipolar disorder (HCC)   . Charcot foot due to diabetes mellitus (HCC)   . Depression   . Dermatitis seborrheica   . Diabetes mellitus without complication (HCC)   . Eczema   . Hyperlipemia   . Hypertension   . Hypothyroidism   . Neuropathy   . Psoriasis   . Psychosis (HCC)   . Respiratory failure (HCC)   . Sleep apnea, obstructive   . Stroke [facility](HCC)  left hand weakness    Past Surgical History:  Procedure Laterality Date  . BONE EXCISION Left 09/21/2018   Procedure: Partial excision of metatarsal bone;  Surgeon: Gwyneth Revels, DPM;  Location: [facility] ORS;  Service: Podiatry;  Laterality: Left;  . BONE EXOSTOSIS EXCISION Left 05/11/2018   Procedure: Excisional Debridment ulcer left foot;  Surgeon: Gwyneth Revels, DPM;  Location: [facility] ORS;  Service: Podiatry;  Laterality: Left;  . bone removed     bone removed in foot due to infection   . TONSILLECTOMY      Social History Social History   Tobacco Use  . Smoking status: Former Smoker    Packs/day: 0.25    Quit date: 03/03/2018    Years since quitting: 2.7  . Smokeless tobacco: Never Used  Vaping Use  . Vaping Use:  Never used  Substance Use Topics  . Alcohol use: No  . Drug use: No    Family History Family History  Problem Relation Age of Onset  . Heart failure Mother   . Stroke Father   No family history of bleeding/clotting disorders, porphyria or autoimmune disease   No Known Allergies   REVIEW OF SYSTEMS (Negative unless checked)  Constitutional: [] Weight loss  [] Fever  [] Chills Cardiac: [] Chest pain   [] Chest pressure   [] Palpitations   [] Shortness of breath when laying flat   [] Shortness of breath at rest   [] Shortness of breath with exertion. Vascular:  [] Pain in legs with walking   [] Pain in legs at rest   [] Pain in legs when laying flat   [] Claudication   [] Pain in feet when walking  [] Pain in feet at rest  [] Pain in feet when laying flat   [] History of DVT   [] Phlebitis   [x] Swelling in legs   [x] Varicose veins   [] Non-healing ulcers Pulmonary:   [] Uses home oxygen   [] Productive cough   [] Hemoptysis   [] Wheeze  [] COPD   [] Asthma Neurologic:  [] Dizziness  [] Blackouts   [] Seizures   [] History of stroke   [] History of TIA  [] Aphasia   [] Temporary blindness   [] Dysphagia   [] Weakness or numbness in arms   [] Weakness or numbness in legs Musculoskeletal:  [] Arthritis   [x] Joint swelling   [x] Joint pain   [] Low back pain Hematologic:  [] Easy bruising  [] Easy bleeding   [] Hypercoagulable state   [] Anemic  [] Hepatitis Gastrointestinal:  [] Blood in stool   [] Vomiting blood  [] Gastroesophageal reflux/heartburn   [] Difficulty swallowing. Genitourinary:  [] Chronic kidney disease   [] Difficult urination  [] Frequent urination  [] Burning with urination   [] Blood in urine Skin:  [x] Rashes   [x] Ulcers   [x] Wounds Psychological:  [] History of anxiety   []  History of major depression.    Physical Examination  Vitals:   12/18/20 0500 12/18/20 0511 12/18/20 0750 12/18/20 1153  BP:  117/66 127/62 133/66  Pulse:  68 65 66  Resp:  18 16 17   Temp:  97.7 F (36.5 C) 97.7 F (36.5 C) 98.1 F (36.7 C)   TempSrc:  Oral Oral Oral  SpO2:  98% 100%   Weight: 133.1 kg     Height:       Body mass index is 40.93 kg/m.  Head: /AT, No temporalis wasting. Prominent temp pulse not noted. Ear/Nose/Throat: Nares w/o erythema or drainage, oropharynx w/o obsrtuction, Mallampati score: 4.  Dentition poor.  Eyes: PERRLA, Sclera nonicteric.  Neck: Supple, no nuchal rigidity.  No bruit or JVD.  Pulmonary:  Breath sounds equal bilaterally, no use of  accessory muscles.  Cardiac: RRR, normal S1, S2, no Murmurs, rubs or gallops. Vascular: Severe venous changes to the skin and the ankle and calf area bilaterally left is greater than right.  Charcot foot deformity of the left profound Charcot foot deformity of the right is mild.  The right DP is a 1+ posterior tibials nonpalpable.  Pulses on the left are nonpalpable.  There is a weakly palpable popliteal pulse bilaterally Gastrointestinal: soft, non-tender, non-distended.  Musculoskeletal: Moves all extremities.  No deformity or atrophy. No edema. Neurologic: CN 2-12 intact. Symmetrical.  Speech is fluent.  Psychiatric: Judgment intact, Mood & affect appropriate for pt's clinical situation. Dermatologic: No rashes left foot is bandaged.  There is redness and tenderness of the left ankle consistent with cellulitis and open wounds.      CBC Lab Results  Component Value Date   WBC 8.3 12/18/2020   HGB 12.1 (L) 12/18/2020   HCT 35.1 (L) 12/18/2020   MCV 90.2 12/18/2020   PLT 240 12/18/2020    BMET    Component Value Date/Time   NA 144 12/18/2020 0451   NA 143 04/26/2014 0309   K 4.0 12/18/2020 0451   K 4.1 04/26/2014 0309   CL 110 12/18/2020 0451   CL 106 04/26/2014 0309   CO2 25 12/18/2020 0451   CO2 27 04/26/2014 0309   GLUCOSE 93 12/18/2020 0451   GLUCOSE 100 (H) 04/26/2014 0309   BUN 16 12/18/2020 0451   BUN 11 04/26/2014 0309   CREATININE 0.90 12/18/2020 0451   CREATININE 1.08 04/26/2014 0309   CALCIUM 8.8 (L) 12/18/2020 0451    CALCIUM 8.5 04/26/2014 0309   GFRNONAA >60 12/18/2020 0451   GFRNONAA >60 04/26/2014 0309   GFRAA >60 09/19/2018 1156   GFRAA >60 04/26/2014 0309   Estimated Creatinine Clearance: 123 mL/min (by C-G formula based on SCr of 0.9 mg/dL).  COAG Lab Results  Component Value Date   INR 1.1 12/18/2020   INR 0.97 03/06/2018   INR 1.40 10/09/2015    Radiology MRI of the left foot obtained December 17, 2020 is reviewed by me it demonstrates the area of ulceration and findings consistent with both acute as well as chronic osteomyelitis.   Assessment/Plan 1.  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  I concur with Dr. Ether Griffins an amputation would be the most appropriate course of action at this time.  On examination I am unable to appreciate pulses at the ankle level on the left.  At this point my plan is for antibiotic therapy for several days hopefully this will clear the lymphatics and reduce his edema and inflammation.  I can then reassess and if I do not appreciate palpable pulses we will plan for an angiogram of the left lower extremity on Tuesday.  Based on the results I would proceed with a left below-knee amputation on Wednesday.  The risk and benefits were reviewed with the patient all questions were answered and he is in agreement with this plan and procedure.  2.  Diabetes mellitus: Continue hypoglycemic medications as already ordered, these medications have been reviewed and there are no changes at this time.  Hgb A1C to be monitored as already arranged by primary service  3.  Hypertension: Continue antihypertensive medications as already ordered, these medications have been reviewed and there are no changes at this time.  4.  Hyperlipidemia: Continue statin as ordered and  reviewed, no changes at this time     Levora Dredge, MD  12/18/2020 1:49 PM

## 2020-12-18 NOTE — Progress Notes (Signed)
Initial Nutrition Assessment  DOCUMENTATION CODES:   Morbid obesity  INTERVENTION:  Provide Ensure Max Protein po BID, each supplement provides 150 kcal and 30 grams of protein. Patient prefers strawberry.  Provide MVI po daily.  NUTRITION DIAGNOSIS:   Increased nutrient needs related to wound healing as evidenced by estimated needs.  GOAL:   Patient will meet greater than or equal to 90% of their needs  MONITOR:   PO intake,Supplement acceptance,Labs,Weight trends,I & O's,Skin  REASON FOR ASSESSMENT:   Malnutrition Screening Tool    ASSESSMENT:   60 year old male with PMHx of asthma, HTN, HLD, DM, hypothyroidism, bipolar disorder, sleep apnea, depression, Charcot foot, eczema admitted with infected left diabetic foot wound.   Met with patient at bedside. He reports he began gaining weight from a medication he was put on for bipolar. He reports he used to weight close to 500 lbs at his highest. He began gradually losing weight intentionally over several years after he was taken off that medication and then also reports he lost some weight while he was staying at [facility] [facility] as he did not like the food there. He reports he is at a family care home now. He eats 3 meals per day and enjoys the food. He reports eating 100% of his meals and choosing a good source of protein at each meal. Patient also amenable to trying Ensure Max Protein to help meet increased protein needs.  Weight loss not significant for time frame as it occurred over several years per patient report.  Medications reviewed and include: Novolog 0-6 units TID, levothyroxine, Protonix, Unasyn, vancomycin.  Labs reviewed: CBG 76-121.  NUTRITION - FOCUSED PHYSICAL EXAM:  Flowsheet Row Most Recent Value  Orbital Region No depletion  Upper Arm Region No depletion  Thoracic and Lumbar Region No depletion  Buccal Region No depletion  Temple Region No depletion  Clavicle Bone Region No depletion  Clavicle  and Acromion Bone Region No depletion  Scapular Bone Region No depletion  Dorsal Hand No depletion  Patellar Region No depletion  Anterior Thigh Region No depletion  Posterior Calf Region No depletion  Edema (RD Assessment) Mild  Hair Reviewed  Eyes Reviewed  Mouth Reviewed  Skin Reviewed  Nails Reviewed     Diet Order:   Diet Order            Diet Carb Modified Fluid consistency: Thin; Room service appropriate? Yes  Diet effective now                EDUCATION NEEDS:   No education needs have been identified at this time  Skin:  Skin Assessment: Skin Integrity Issues: Skin Integrity Issues:: Stage I,Stage II,Diabetic Ulcer Stage I: buttocks Stage II: coccyx Diabetic Ulcer: left foot  Last BM:  12/15/2020 per chart  Height:   Ht Readings from Last 1 Encounters:  12/17/20 '5\' 11"'  (1.803 m)   Weight:   Wt Readings from Last 1 Encounters:  12/18/20 133.1 kg   BMI:  Body mass index is 40.93 kg/m.  Estimated Nutritional Needs:   Kcal:  2400-2600  Protein:  120-130 grams  Fluid:  >/= 2 L/day  Jacklynn Barnacle, MS, RD, LDN Pager number available on Amion

## 2020-12-18 NOTE — Consult Note (Signed)
ORTHOPAEDIC CONSULTATION  REQUESTING PHYSICIAN: Swayze, Ava, DO  Chief Complaint: Left foot ulceration  HPI: Adrian DiegoCarlton B Hobart Jr. is a 60 y.o. male who complains of worsening ulceration to his left foot.  He was sent from my office to the hospital with worsening necrotic ulceration to the plantar aspect of his left foot.  He is undergone multiple bone debridements by myself and other providers in the past.  We have attempted to perform longstanding wound care to this left foot.  He has continued recurrent ulceration to this area.  Yesterday in the outpatient clinic he had a large area of deep necrotic tissue with worsening redness and swelling to this left foot.  Admitted for further work-up MRI and further evaluation.  Past Medical History:  Diagnosis Date  . Anemia   . Asthma   . Bipolar disorder (HCC)   . Charcot foot due to diabetes mellitus (HCC)   . Depression   . Dermatitis seborrheica   . Diabetes mellitus without complication (HCC)   . Eczema   . Hyperlipemia   . Hypertension   . Hypothyroidism   . Neuropathy   . Psoriasis   . Psychosis (HCC)   . Respiratory failure (HCC)   . Sleep apnea, obstructive   . Stroke [facility](HCC)    left hand weakness   Past Surgical History:  Procedure Laterality Date  . BONE EXCISION Left 09/21/2018   Procedure: Partial excision of metatarsal bone;  Surgeon: [name], Javien Tesch, DPM;  Location: [facility] ORS;  Service: Podiatry;  Laterality: Left;  . BONE EXOSTOSIS EXCISION Left 05/11/2018   Procedure: Excisional Debridment ulcer left foot;  Surgeon: [name], Autry Prust, DPM;  Location: [facility] ORS;  Service: Podiatry;  Laterality: Left;  . bone removed     bone removed in foot due to infection   . TONSILLECTOMY     Social History   Socioeconomic History  . Marital status: Single    Spouse name: Not on file  . Number of children: Not on file  . Years of education: Not on file  . Highest education level: Not on file  Occupational History  . Not on file   Tobacco Use  . Smoking status: Former Smoker    Packs/day: 0.25    Quit date: 03/03/2018    Years since quitting: 2.7  . Smokeless tobacco: Never Used  Vaping Use  . Vaping Use: Never used  Substance and Sexual Activity  . Alcohol use: No  . Drug use: No  . Sexual activity: Not on file  Other Topics Concern  . Not on file  Social History Narrative  . Not on file   Social Determinants of Health   Financial Resource Strain: Not on file  Food Insecurity: Not on file  Transportation Needs: Not on file  Physical Activity: Not on file  Stress: Not on file  Social Connections: Not on file   Family History  Problem Relation Age of Onset  . Heart failure Mother   . Stroke Father    No Known Allergies Prior to Admission medications   Medication Sig Start Date End Date Taking? Authorizing Provider  atorvastatin (LIPITOR) 40 MG tablet Take 40 mg by mouth at bedtime.    Yes [provider]  Azelastine HCl 137 MCG/SPRAY SOLN Place 1 spray into both nostrils 2 (two) times daily. (0800 & 2000) Use in each nostril as directed   Yes [provider]  cholecalciferol (VITAMIN D) 1000 units tablet Take 1,000 Units by mouth daily. (0800)  Yes [provider]  ferrous sulfate 325 (65 FE) MG tablet Take 325 mg by mouth 2 (two) times daily with a meal. (0800 & 2000)   Yes [provider]  fluticasone furoate-vilanterol (BREO ELLIPTA) 100-25 MCG/INH AEPB Inhale 1 puff into the lungs daily. (0800)   Yes [provider]  levothyroxine (SYNTHROID, LEVOTHROID) 75 MCG tablet Take 75 mcg by mouth daily before breakfast. (0700)   Yes [provider]  lisinopril (ZESTRIL) 10 MG tablet Take 10 mg by mouth daily. 12/01/20  Yes [provider]  metoprolol (LOPRESSOR) 100 MG tablet Take 1 tablet (100 mg total) by mouth 2 (two) times daily. Patient taking differently: Take 100 mg by mouth 2 (two) times daily. (0800 & 2000) 10/20/15  Yes Drema Dallas, MD  montelukast (SINGULAIR) 10 MG tablet Take 10 mg by mouth daily. (0800)   Yes [provider]  Multiple Vitamin (THEREMS PO) Take 1 tablet by mouth daily. (0800)   Yes [provider]  oxcarbazepine (TRILEPTAL) 600 MG tablet Take 600 mg by mouth 2 (two) times daily. (0800 & 2000)   Yes [provider]  pantoprazole (PROTONIX) 40 MG tablet Take 1 tablet (40 mg total) by mouth daily at 12 noon. 10/20/15  Yes Drema Dallas, MD  senna (SENOKOT) 8.6 MG tablet Take 1 tablet by mouth at bedtime as needed for constipation.   Yes [provider]  Tiotropium Bromide Monohydrate 2.5 MCG/ACT AERS Inhale 1 puff into the lungs daily. (0800)   Yes [provider]  triamcinolone cream (KENALOG) 0.1 % Apply 1 application topically 2 (two) times daily as needed (until rash clears). (0800 & 2000)   Yes [provider]  ziprasidone (GEODON) 40 MG capsule Take 40 mg by mouth 2 (two) times daily with a meal. (0800 & 2000) 09/07/15  Yes [provider]  zolpidem (AMBIEN) 5 MG tablet Take 5 mg by mouth at bedtime.   Yes [provider]  acetaminophen (TYLENOL) 325 MG tablet Take 650 mg by mouth every 4 (four) hours as needed for mild pain or fever.     [provider]  albuterol (PROVENTIL HFA;VENTOLIN HFA) 108 (90 Base) MCG/ACT inhaler Inhale 2 puffs into the lungs every 6 (six) hours as needed for wheezing or shortness of breath.    [provider]  alum & mag hydroxide-simeth (MAALOX/MYLANTA) 200-200-20 MG/5ML suspension Take 30 mLs by mouth as needed for indigestion or heartburn.     [provider]  bismuth subsalicylate (PEPTO BISMOL) 262 MG/15ML suspension Take 30 mLs by mouth every 6 (six) hours as needed for indigestion.     [provider]  lisinopril (PRINIVIL,ZESTRIL) 2.5 MG tablet Take 2.5 mg by mouth daily. (0800)    [provider]  loperamide (IMODIUM A-D) 2 MG tablet Take 2-4 mg by mouth  4 (four) times daily as needed for diarrhea or loose stools.     [provider]  magnesium hydroxide (MILK OF MAGNESIA) 400 MG/5ML suspension Take 15-30 mLs by mouth daily as needed for mild constipation.     [provider]  nystatin (MYCOSTATIN/NYSTOP) powder Apply 1 g topically 2 (two) times daily as needed (FOR FUNGAL RASH). Under abdomen/groin    [provider]   MR FOOT LEFT W WO CONTRAST  Result Date: 12/17/2020 CLINICAL DATA:  For osteomyelitis for for for EXAM: MRI OF THE LEFT FOREFOOT WITHOUT AND WITH CONTRAST TECHNIQUE: Multiplanar, multisequence MR imaging of the left was performed  both before and after administration of intravenous contrast. CONTRAST:  84mL GADAVIST GADOBUTROL 1 MMOL/ML IV SOLN COMPARISON:  December 10, 2018 FINDINGS: Bones/Joint/Cartilage There is a area of ulceration seen overlying the plantar lateral aspect of the midfoot measuring 1.3 cm in length with underlying significant edema and possible sinus tract to the cuboid. There is chronic cortical irregularity of the cuboid. However there is increased marrow signal with subtle T1 hypointensity seen at the inferior surface of the cuboid. No definite loculated fluid collections are seen. 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 No large joint effusion is noted. There is chronic partial resection at the base of the fifth metatarsal Ligaments Suboptimally visualized Muscles and Tendons Diffusely increased signal with fatty atrophy of the muscles is noted. The visualized portion of the tendons appear to be intact. Soft tissues As described above area of ulceration on the plantar lateral aspect of the midfoot. There is heel pad inflammation is subcutaneous edema. IMPRESSION: Area of ulceration along the plantar lateral aspect of the midfoot with a possible sinus tract to the underlying cuboid. There is findings of acute on chronic osteomyelitis involving the cuboid. No definite loculated  fluid collections. 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 Electronically Signed   By: Jonna Clark M.D.   On: 12/17/2020 22:49    Positive ROS: All other systems have been reviewed and were otherwise negative with the exception of those mentioned in the HPI and as above.  12 point ROS was performed.  Physical Exam: General: Alert and oriented.  No apparent distress.  Vascular:  Left foot:Dorsalis Pedis:  diminished Posterior Tibial:  diminished  Right foot: Dorsalis Pedis:  diminished Posterior Tibial:  diminished  Neuro:absent protective sensation  Derm: On the plantar aspect of his left midfoot is a deep ulceration that after debridement measures about 3 x 2 cm with a depth of about a centimeter and a half.  In the central aspect there was a large area of deep necrotic foul-smelling tissue.  Surrounding erythema to the midfoot was noted as well.  Ortho/MS: Patient does have a rocker-bottom deformity of this left foot.  He is undergone previous fifth ray amputation and debridement of bone in this region in the past.  Outpatient x-ray showed previous bone debridement and excision of lateral fifth metatarsal.  No erosive changes or gas in the soft tissue on x-ray.  MRI is consistent with osteomyelitis in the midfoot.  MRI results: IMPRESSION: Area of ulceration along the plantar lateral aspect of the midfoot with a possible sinus tract to the underlying cuboid. There is findings of acute on chronic osteomyelitis involving the cuboid. No definite loculated fluid collections.  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  Assessment: Diabetic neuropathic ulceration left foot Osteomyelitis left midfoot  Plan: Unfortunately this patient has undergone previous debridement of bone in this region and removal of the fifth ray.  At this time he has chronic and subacute osteomyelitis of the left midfoot.  I do not believe there is very much in the way  of limb salvage given the fact that removal of all this infected bone would not leave a functional foot.  He has had this chronic ulceration for some time and have previously discussed amputation in the past.  He is also had this discussion with previous physicians years ago as well.  At this point my recommendation is amputation as I do not believe will ever get this wound to heal.  I discussed  the possibility of just continue with local wound care debridement and conservative care but at this point he feels comfortable with amputation.  I will asked vascular surgery to discuss with patient going forward.    Irean Hong, DPM Cell [phone]   12/18/2020 10:57 AM

## 2020-12-18 NOTE — Progress Notes (Signed)
PROGRESS NOTE  [place] XBJ:[id number] DOB: 1961/10/04 DOA: 12/17/2020 PCP: [facility] [place]  Brief History   The patient is a 60 yr old man who presented to [facility] ED on 12/17/2020 on the instruction of his podiatrist Dr. Vickki Muff due to 2 weeks of worsening erythema, swelling and pain associated with a chronic wound of the plantar aspect of the left midfoot. There has also been purulent discharge from the foot during that period of time.   [facility] were consulted to admit the patient for further evaluation and treatment. Dr. Vickki Muff was consulted to follow the patient as inpatient. There was no history of trauma, paresthesias, numbness, or crepitus in the extremity. The patient also denies fevers.   Blood cultures x 2 were obtained. The patient has been started on Vancomycin and Unasyn. He is receiving pain control.   MRI of the foot was obtained. It has demonstrated acute on chronic osteomyelitis involving the cuboid. There was an area of ulceration along the plantar lateral aspect of the midfoot. There were no deficint loculated fluid collections. The patient has had a prior amputation of the fifth metatarsal base.  The recommendation of Dr. Vickki Muff is amputation as he does not believe that the wound will heal. The patient is agreeable to this solution. Vascular surgery has been consulted.  Consultants  . Podiatry . Vascular surgery  Procedures  . None  Antibiotics   Anti-infectives (From admission, onward)   Start     Dose/Rate Route Frequency Ordered Stop   12/18/20 1800  vancomycin (VANCOREADY) IVPB 1250 mg/250 mL        1,250 mg 166.7 mL/hr over 90 Minutes Intravenous Every 12 hours 12/18/20 0900     12/18/20 0600  vancomycin (VANCOCIN) IVPB 1000 mg/200 mL premix  Status:  Discontinued        1,000 mg 200 mL/hr over 60 Minutes Intravenous Every 12 hours 12/17/20 1702 12/18/20 0900   12/17/20 1800  Ampicillin-Sulbactam (UNASYN) 3 g in sodium  chloride 0.9 % 100 mL IVPB        3 g 200 mL/hr over 30 Minutes Intravenous Every 6 hours 12/17/20 1702     12/17/20 1500  vancomycin (VANCOREADY) IVPB 2000 mg/400 mL        2,000 mg 200 mL/hr over 120 Minutes Intravenous  Once 12/17/20 1428 12/17/20 2029   12/17/20 1430  piperacillin-tazobactam (ZOSYN) IVPB 3.375 g        3.375 g 100 mL/hr over 30 Minutes Intravenous  Once 12/17/20 1426 12/17/20 1641    .   Subjective  The patient is resting quietly. No new complaints.  Objective   Vitals:  Vitals:   12/18/20 0750 12/18/20 1153  BP: 127/62 133/66  Pulse: 65 66  Resp: 16 17  Temp: 97.7 F (36.5 C) 98.1 F (36.7 C)  SpO2: 100%    Exam:  Constitutional:  . The patient is awake, alert, and oriented x 3. No acute distress. Respiratory:  . No increased work of breathing. . No wheezes, rales, or rhonchi . No tactile fremitus Cardiovascular:  . Regular rate and rhythm . No murmurs, ectopy, or gallups. . No lateral PMI. No thrills. Abdomen:  . Abdomen is soft, non-tender, non-distended . No hernias, masses, or organomegaly . Normoactive bowel sounds.  Musculoskeletal:  . No cyanosis, clubbing, or edema . Left foot is bandaged. Skin:  . No rashes, lesions, ulcers . palpation of skin: no induration or nodules Neurologic:  . CN 2-12 intact .  Sensation all 4 extremities intact Psychiatric:  . Mental status o Mood, affect appropriate o Orientation to person, place, time  . judgment and insight appear intact  I have personally reviewed the following:   Today's Data  . Vitals, BMP, CBC  Micro Data  . Blood cultures x 2 have had no growth.  Imaging  . MRI left foot.  Scheduled Meds: . atorvastatin  40 mg Oral QHS  . ferrous sulfate  325 mg Oral BID WC  . fluticasone furoate-vilanterol  1 puff Inhalation Daily  . insulin aspart  0-6 Units Subcutaneous TID WC  . levothyroxine  75 mcg Oral QAC breakfast  . montelukast  10 mg Oral Daily  . [START ON 12/19/2020]  multivitamin with minerals  1 tablet Oral Daily  . oxcarbazepine  600 mg Oral BID  . pantoprazole  40 mg Oral Q1200  . Ensure Max Protein  11 oz Oral BID BM  . umeclidinium bromide  1 puff Inhalation Daily  . ziprasidone  40 mg Oral BID WC   Continuous Infusions: . ampicillin-sulbactam (UNASYN) IV 3 g (12/18/20 1204)  . vancomycin      Principal Problem:   Diabetic foot ulcer ([place]) Active Problems:   Asthma, chronic   Essential hypertension, benign   Bipolar I disorder (HCC)   Hyperlipidemia, unspecified   DM2 (diabetes mellitus, type 2) ([place])   LOS: 1 day   A & P   Infected left diabetic foot wound: Progressive erythema, tenderness, swelling, increased warmth associated with wound on the plantar surface of the left foot, with progression of these features over the last few weeks in spite of compliantly following up in podiatry clinic, as above, prompting patient's podiatrist, Dr.**Recommend the patient present to [facility] ED today for further evaluation/management of the above.  Dr.Fowler Has been formally consulted, confirms that he will see the patient.  He recommends pursuit of MRI of the left foot to further evaluate for underlying osteomyelitis as well as initiation of broad-spectrum IV antibiotics.  Of note, no SIRS criteria are met at this time, and therefore criteria are not met for sepsis at present.  No clinical or physical exam findings to suggest necrotizing fasciitis. Received IV vancomycin and Zosyn in the ED today.   Plan: Continue IV vancomycin, and initiate Unasyn, which also provide anaerobic coverage for this infected left diabetic foot wound.  Check blood cultures x2.  Repeat CBC with differential in the morning.  Check MRI of the left foot, per recommendation of podiatry consultation, as above.  Add on CRP and ESR to further evaluate for osteomyelitis.  As needed IV fentanyl.    MRI of the foot was obtained. It has demonstrated acute on chronic osteomyelitis involving  the cuboid. There was an area of ulceration along the plantar lateral aspect of the midfoot. There were no deficint loculated fluid collections. The patient has had a prior amputation of the fifth metatarsal base.  The recommendation of Dr. Vickki Muff is amputation as he does not believe that the wound will heal. The patient is agreeable to this solution. Vascular surgery has been consulted.  Asymptomatic pyuria: Presenting urinalysis shows evidence of greater than 50 white blood cells, but with rare bacteria, all in the context of the patient denying any recent acute urinary symptoms. This constellation of lab findings and absence of symptoms appears consistent with asymptomatic pyuria.  Therefore, will not initiate antibiotic coverage specific to treat this pyuria at the present time.  Rather, we will add on urine  culture, and continue to monitor the patient clinically for evidence of development of acute urinary symptoms.   Urine culture is pending.  Monitor results blood cultures x2.  Refrain from antibiotic treatment for the specific purpose of UTI coverage.  Repeat CBC with differential in the morning.  Type 2 diabetes mellitus: Well controlled as an outpatient with most recent hemoglobin A1c noted to be 5.1% when checked during December 2021.  Has been complicated by peripheral polyneuropathy.  The patient has recently been managed conservatively with lifestyle modifications, and is currently not on any insulin or oral hypoglycemic agents at home.  Presenting blood sugar noted to be 101.  The patient's blood sugars are being followed by FSBS AC and HS followed by low-dose sliding scale insulin. Glucoses have been 71-121 in the last 24 hours.   Acquired hypothyroidism: On Synthroid as an outpatient. He has been continued on his home dose of Synthroid.  History of essential hypertension: Blood pressures are normotensive.  On the following antihypertensive medications at home: Lopressor,  lisinopril.  Presenting systolic blood pressures noted to be in the range of 100 - 120 mmHg.  These have not been continued as inpatient as the patient's blood pressures have not warranted it. Will continue to monitor.  Hyperlipidemia: On atorvastatin as an outpatient. Continue home atorvastatin.  Moderate persistent asthma: Outpatient respiratory regimen consists of tiotropium, Breo Ellipta, Singulair, and as needed albuterol inhaler.  No clinical evidence to suggest acute exacerbation of underlying asthma at this time. Continue home respiratory regimen.  Phos and Magnesium are replete.  GERD: On Protonix as an outpatient. Continue home PPI.  Bipolar disorder: On scheduled Geodon as an outpatient. Will continue as inpatient.   I have seen and examined this patient myself. I have spent 35 minutes in his evaluation and care.  DVT prophylaxis: scd's  Code Status: Full code Family Communication: none Disposition Plan:  Status is: Inpatient  Remains inpatient appropriate because:IV treatments appropriate due to intensity of illness or inability to take PO   Dispo: The patient is from: Home              Anticipated d/c is to: tbd              Patient currently is not medically stable to d/c.   Difficult to place patient No  Jefferson Fullam, DO [facility] Direct contact: see [email or web address]  7PM-7AM contact night coverage as above 12/18/2020, 3:33 PM  LOS: 1 day

## 2020-12-18 NOTE — TOC Initial Note (Signed)
Transition of Care (TOC) - Initial/Assessment Note    Patient Details  Name: Adrian [name]. MRN: [id number] Date of Birth: 09-Aug-1961  Transition of Care [facility]) CM/SW Contact:    Shelbie Hutching, RN Phone Number: 12/18/2020, 3:18 PM  Clinical Narrative:                 Patient admitted with diabetic foot ulcer and osteomyelitis.  RNCM met with patient at the bedside.  Patient reports that he lives at [facility] and has been there for about 4 years.  He said after his mother died he and his older sister were both placed in different homes.   Patient reports that he has a walker and uses another residents wheelchair.  Patient would like to go to rehab.  He says that the doctor told him that they would need to amputate the foot, he seems okay with this plan and tells this RNCM that they plan for surgery on Wed.   TOC team will cont to follow and assist with discharge planning.   Expected Discharge Plan: Skilled Nursing Facility Barriers to Discharge: Continued Medical Work up   Patient Goals and CMS Choice Patient states their goals for this hospitalization and ongoing recovery are:: Patient wants to have a successful surgery and go to rehab and be able to take care of himself CMS Medicare.gov Compare Post Acute Care list provided to:: Patient Choice offered to / list presented to : Patient  Expected Discharge Plan and Services Expected Discharge Plan: [place]   Discharge Planning Services: CM Consult Post Acute Care Choice: [place] Living arrangements for the past 2 months: Group Home                 DME Arranged: N/A DME Agency: NA                  Prior Living Arrangements/Services Living arrangements for the past 2 months: Group Home Lives with:: Facility Resident Patient language and need for interpreter reviewed:: Yes Do you feel safe going back to the place where you live?: Yes      Need for Family Participation in  Patient Care: Yes (Comment) Care giver support system in place?: Yes (comment) (group home) Current home services: DME (walker and wheelchair) Criminal Activity/Legal Involvement Pertinent to Current Situation/Hospitalization: No - Comment as needed  Activities of Daily Living Home Assistive Devices/Equipment: Wheelchair ADL Screening (condition at time of admission) Patient's cognitive ability adequate to safely complete daily activities?: Yes Is the patient deaf or have difficulty hearing?: No Does the patient have difficulty seeing, even when wearing glasses/contacts?: No Does the patient have difficulty concentrating, remembering, or making decisions?: No Patient able to express need for assistance with ADLs?: Yes Does the patient have difficulty dressing or bathing?: No Independently performs ADLs?: Yes (appropriate for developmental age) Does the patient have difficulty walking or climbing stairs?: Yes Weakness of Legs: Both Weakness of Arms/Hands: None  Permission Sought/Granted Permission sought to share information with : Case Nature conservation officer Permission granted to share information with : Yes, Verbal Permission Granted  Share Information with NAME: Madelynn Done  Permission granted to share info w AGENCY: SNF's  Permission granted to share info w Relationship: group home     Emotional Assessment Appearance:: Appears stated age Attitude/Demeanor/Rapport: Engaged Affect (typically observed): Accepting Orientation: : Oriented to Self,Oriented to Place,Oriented to  Time,Oriented to Situation Alcohol / Substance Use: Not Applicable Psych Involvement: No (comment)  Admission  diagnosis:  Diabetic foot ulcer ([place]) [G83.662, L97.509] Diabetic ulcer of left midfoot associated with type 2 diabetes mellitus, with other ulcer severity ([place]) [H47.654, L97.428] Patient Active Problem List   Diagnosis Date Noted  . DM2 (diabetes mellitus, type 2) ([place]) 12/18/2020  .  Diabetic foot ulcer ([place]) 12/17/2020  . Diabetic foot infection ([place]) 09/25/2017  . Foot pain 09/24/2017  . Acute osteomyelitis of left foot ([place]) 09/23/2017  . Left hand weakness 07/05/2016  . Numbness and tingling 07/05/2016  . Hypernatremia   . Candidal pneumonia ([place])   . Abdominal wall cellulitis   . Bacteremia due to Streptococcus   . Dysphagia   . OSA (obstructive sleep apnea)   . Obesity hypoventilation syndrome ([place])   . Bipolar I disorder ([place])   . Other specified hypothyroidism   . Tracheostomy care ([place])   . C. difficile colitis   . Acute respiratory failure with hypoxia ([place])   . Encounter for imaging study to confirm nasogastric (NG) tube placement   . History of ETT   . Acute respiratory failure ([place])   . ARDS (adult respiratory distress syndrome) ([place])   . Acute renal failure ([place])   . Respiratory failure ([place]) 10/04/2015  . Sepsis ([place]) 10/02/2015  . Acute kidney injury ([place]) 09/23/2014  . Allergic rhinitis, cause unspecified 02/24/2014  . Unspecified constipation 02/24/2014  . Essential hypertension, benign 02/24/2014  . UTI (lower urinary tract infection) 01/16/2014  . Altered mental status 01/16/2014  . Diabetic osteomyelitis ([place]) 01/03/2014  . Diabetic neuropathy ([place]) 01/03/2014  . Obstructive sleep apnea 01/03/2014  . Asthma, chronic 01/03/2014  . Seborrheic dermatitis of scalp 01/03/2014  . Morbid obesity ([place]) 01/03/2014  . HTN (hypertension) 10/05/2013  . Hyperlipidemia, unspecified 10/05/2013  . Osteomyelitis ([place]) 10/05/2013   PCP:  [facility] [place]:   [place], [address] [place] [address] Phone: [phone] Fax: [phone]     Social Determinants of Health (SDOH) Interventions    Readmission Risk Interventions No flowsheet data found.

## 2020-12-19 LAB — GLUCOSE, CAPILLARY
Glucose-Capillary: 89 mg/dL (ref 70–99)
Glucose-Capillary: 104 mg/dL — ABNORMAL HIGH (ref 70–99)
Glucose-Capillary: 123 mg/dL — ABNORMAL HIGH (ref 70–99)
Glucose-Capillary: 123 mg/dL — ABNORMAL HIGH (ref 70–99)

## 2020-12-19 LAB — CREATININE, SERUM
Creatinine, Ser: 0.83 mg/dL (ref 0.61–1.24)
GFR, Estimated: >60 mL/min (ref >60)

## 2020-12-19 LAB — URINE CULTURE

## 2020-12-20 DIAGNOSIS — E119 Type 2 diabetes mellitus without complications: Secondary | ICD-10-CM | POA: Diagnosis not present

## 2020-12-20 DIAGNOSIS — E11621 Type 2 diabetes mellitus with foot ulcer: Secondary | ICD-10-CM | POA: Diagnosis not present

## 2020-12-20 DIAGNOSIS — F319 Bipolar disorder, unspecified: Secondary | ICD-10-CM | POA: Diagnosis not present

## 2020-12-20 DIAGNOSIS — J4540 Moderate persistent asthma, uncomplicated: Secondary | ICD-10-CM | POA: Diagnosis not present

## 2020-12-20 LAB — CBC WITH DIFFERENTIAL/PLATELET
Abs Immature Granulocytes: 0.03 10*3/uL (ref 0.00–0.07)
Basophils Absolute: 0 10*3/uL (ref 0.0–0.1)
Basophils Relative: 0 %
Eosinophils Absolute: 0.2 10*3/uL (ref 0.0–0.5)
Eosinophils Relative: 2 %
HCT: 34.5 % — ABNORMAL LOW (ref 39.0–52.0)
Hemoglobin: 11.8 g/dL — ABNORMAL LOW (ref 13.0–17.0)
Immature Granulocytes: 0 %
Lymphocytes Relative: 18 %
Lymphs Abs: 1.6 10*3/uL (ref 0.7–4.0)
MCH: 30.9 pg (ref 26.0–34.0)
MCHC: 34.2 g/dL (ref 30.0–36.0)
MCV: 90.3 fL (ref 80.0–100.0)
Monocytes Absolute: 0.6 10*3/uL (ref 0.1–1.0)
Monocytes Relative: 8 %
Neutro Abs: 6.1 10*3/uL (ref 1.7–7.7)
Neutrophils Relative %: 72 %
Platelets: 245 10*3/uL (ref 150–400)
RBC: 3.82 MIL/uL — ABNORMAL LOW (ref 4.22–5.81)
RDW: 13.3 % (ref 11.5–15.5)
WBC: 8.5 10*3/uL (ref 4.0–10.5)
nRBC: 0 % (ref 0.0–0.2)

## 2020-12-20 LAB — COMPREHENSIVE METABOLIC PANEL
ALT: 15 U/L (ref 0–44)
AST: 19 U/L (ref 15–41)
Albumin: 3.5 g/dL (ref 3.5–5.0)
Alkaline Phosphatase: 127 U/L — ABNORMAL HIGH (ref 38–126)
Anion gap: 9 (ref 5–15)
BUN: 17 mg/dL (ref 6–20)
CO2: 25 mmol/L (ref 22–32)
Calcium: 8.7 mg/dL — ABNORMAL LOW (ref 8.9–10.3)
Chloride: 105 mmol/L (ref 98–111)
Creatinine, Ser: 0.86 mg/dL (ref 0.61–1.24)
GFR, Estimated: >60 mL/min (ref >60)
Glucose, Bld: 112 mg/dL — ABNORMAL HIGH (ref 70–99)
Potassium: 4.1 mmol/L (ref 3.5–5.1)
Sodium: 139 mmol/L (ref 135–145)
Total Bilirubin: 0.6 mg/dL (ref 0.3–1.2)
Total Protein: 6.6 g/dL (ref 6.5–8.1)

## 2020-12-20 LAB — GLUCOSE, CAPILLARY
Glucose-Capillary: 101 mg/dL — ABNORMAL HIGH (ref 70–99)
Glucose-Capillary: 121 mg/dL — ABNORMAL HIGH (ref 70–99)
Glucose-Capillary: 119 mg/dL — ABNORMAL HIGH (ref 70–99)
Glucose-Capillary: 119 mg/dL — ABNORMAL HIGH (ref 70–99)

## 2020-12-20 NOTE — Progress Notes (Addendum)
PROGRESS NOTE  [place] ZOX:[id number] DOB: 1961-07-26 DOA: 12/17/2020 PCP: [facility] [place]  Brief History   The patient is a 60 yr old man who presented to [facility] ED on 12/17/2020 on the instruction of his podiatrist Dr. Vickki Muff due to 2 weeks of worsening erythema, swelling and pain associated with a chronic wound of the plantar aspect of the left midfoot. There has also been purulent discharge from the foot during that period of time.   [facility] were consulted to admit the patient for further evaluation and treatment. Dr. Vickki Muff was consulted to follow the patient as inpatient. There was no history of trauma, paresthesias, numbness, or crepitus in the extremity. The patient also denies fevers.   Blood cultures x 2 were obtained. The patient has been started on Vancomycin and Unasyn. He is receiving pain control.   MRI of the foot was obtained. It has demonstrated acute on chronic osteomyelitis involving the cuboid. There was an area of ulceration along the plantar lateral aspect of the midfoot. There were no deficint loculated fluid collections. The patient has had a prior amputation of the fifth metatarsal base.  The recommendation of Dr. Vickki Muff is amputation as he does not believe that the wound will heal. The patient is agreeable to this solution. Vascular surgery has been consulted and will plan for angiogram tomorrow and possibly amputation on Tuesday.  Consultants  Podiatry Vascular surgery  Procedures  None  Antibiotics   Anti-infectives (From admission, onward)    Start     Dose/Rate Route Frequency Ordered Stop   12/18/20 1800  vancomycin (VANCOREADY) IVPB 1250 mg/250 mL        1,250 mg 166.7 mL/hr over 90 Minutes Intravenous Every 12 hours 12/18/20 0900     12/18/20 0600  vancomycin (VANCOCIN) IVPB 1000 mg/200 mL premix  Status:  Discontinued        1,000 mg 200 mL/hr over 60 Minutes Intravenous Every 12 hours 12/17/20 1702 12/18/20  0900   12/17/20 1800  Ampicillin-Sulbactam (UNASYN) 3 g in sodium chloride 0.9 % 100 mL IVPB        3 g 200 mL/hr over 30 Minutes Intravenous Every 6 hours 12/17/20 1702     12/17/20 1500  vancomycin (VANCOREADY) IVPB 2000 mg/400 mL        2,000 mg 200 mL/hr over 120 Minutes Intravenous  Once 12/17/20 1428 12/17/20 2029   12/17/20 1430  piperacillin-tazobactam (ZOSYN) IVPB 3.375 g        3.375 g 100 mL/hr over 30 Minutes Intravenous  Once 12/17/20 1426 12/17/20 1641       Subjective  The patient is resting quietly. He states that he wants the long acting ambien for sleep.   Objective   Vitals:  Vitals:   12/20/20 0820 12/20/20 1201  BP: 134/76 (!) 156/91  Pulse: 67 85  Resp: 18 18  Temp: 97.9 F (36.6 C) 98.6 F (37 C)  SpO2: 100% 96%   Exam:  Constitutional:  The patient is awake, alert, and oriented x 3. No acute distress. Respiratory:  No increased work of breathing. No wheezes, rales, or rhonchi No tactile fremitus Cardiovascular:  Regular rate and rhythm No murmurs, ectopy, or gallups. No lateral PMI. No thrills. Abdomen:  Abdomen is soft, non-tender, non-distended No hernias, masses, or organomegaly Normoactive bowel sounds.  Musculoskeletal:  No cyanosis, clubbing, or edema Left foot is bandaged. Skin:  No rashes, lesions, ulcers palpation of skin: no induration or nodules Neurologic:  CN 2-12 intact Sensation all 4 extremities intact Psychiatric:  Mental status Mood, affect appropriate Orientation to person, place, time  judgment and insight appear intact  I have personally reviewed the following:   Today's Data  Vitals, BMP, CBC  Micro Data  Blood cultures x 2 have had no growth.  Imaging  MRI left foot.  Scheduled Meds:  atorvastatin  40 mg Oral QHS   ferrous sulfate  325 mg Oral BID WC   fluticasone furoate-vilanterol  1 puff Inhalation Daily   insulin aspart  0-6 Units Subcutaneous TID WC   levothyroxine  75 mcg Oral QAC  breakfast   montelukast  10 mg Oral Daily   multivitamin with minerals  1 tablet Oral Daily   oxcarbazepine  600 mg Oral BID   pantoprazole  40 mg Oral Q1200   Ensure Max Protein  11 oz Oral BID BM   umeclidinium bromide  1 puff Inhalation Daily   ziprasidone  40 mg Oral BID WC   Continuous Infusions:  ampicillin-sulbactam (UNASYN) IV 3 g (12/20/20 1223)   vancomycin 1,250 mg (12/20/20 0551)    Principal Problem:   Diabetic foot ulcer ([place]) Active Problems:   Asthma, chronic   Essential hypertension, benign   Bipolar I disorder ([place])   Hyperlipidemia, unspecified   DM2 (diabetes mellitus, type 2) ([place])   LOS: 3 days   A & P   Infected left diabetic foot wound: Progressive erythema, tenderness, swelling, increased warmth associated with wound on the plantar surface of the left foot, with progression of these features over the last few weeks in spite of compliantly following up in podiatry clinic, as above, prompting patient's podiatrist, Dr.**Recommend the patient present to [facility] ED today for further evaluation/management of the above.  Dr.Fowler Has been formally consulted, confirms that he will see the patient.  He recommends pursuit of MRI of the left foot to further evaluate for underlying osteomyelitis as well as initiation of broad-spectrum IV antibiotics.  Of note, no SIRS criteria are met at this time, and therefore criteria are not met for sepsis at present.  No clinical or physical exam findings to suggest necrotizing fasciitis. Received IV vancomycin and Zosyn in the ED upon admission. This has been continued. MRI of the foot was obtained. It has demonstrated acute on chronic osteomyelitis involving the cuboid. There was an area of ulceration along the plantar lateral aspect of the midfoot. There were no deficint loculated fluid collections. The patient has had a prior amputation of the fifth metatarsal base.  The recommendation of Dr. Vickki Muff is amputation as he does not  believe that the wound will heal. The patient is agreeable to this solution. Vascular surgery has been consulted and will plan for angiogram tomorrow and possibly amputation on Tuesday.  Asymptomatic pyuria: Presenting urinalysis shows evidence of greater than 50 white blood cells, but with rare bacteria, all in the context of the patient denying any recent acute urinary symptoms. This constellation of lab findings and absence of symptoms appears consistent with asymptomatic pyuria.  Therefore, will not initiate antibiotic coverage specific to treat this pyuria at the present time.  Rather, we will add on urine culture, and continue to monitor the patient clinically for evidence of development of acute urinary symptoms.  Urine culture is polymicrobial. Will re-culture.    Type 2 diabetes mellitus: Well controlled as an outpatient with most recent hemoglobin A1c noted to be 5.1% when checked during December 2021.  Has been complicated by peripheral polyneuropathy.  The patient's blood sugars are being followed by FSBS AC and HS followed by low-dose sliding scale insulin. Glucoses have been 101-123 in the last 24 hours.    Acquired hypothyroidism: On Synthroid as an outpatient. He has been continued on his home dose of Synthroid.   History of essential hypertension: Blood pressures are normotensive.  With no antihypertensives. Monitor.  Hyperlipidemia: On atorvastatin as an outpatient. Continue home atorvastatin.   Moderate persistent asthma: Outpatient respiratory regimen consists of tiotropium, Breo Ellipta, Singulair, and as needed albuterol inhaler.  No clinical evidence to suggest acute exacerbation of underlying asthma at this time. Continue home respiratory regimen.  Phos and Magnesium are replete.    GERD: On Protonix as an outpatient. Continue home PPI.   Bipolar disorder: On scheduled Geodon as an outpatient. Will continue as inpatient.   Severe Morbid Obesity with BMI greater than 40:  Complicates all cares. Pt should pursue sensible weight loss through increased activity and dietary modifications as outpatient with the assistance of his PCP.  I have seen and examined this patient myself. I have spent 32 minutes in his evaluation and care.   DVT prophylaxis: scd's  Code Status: Full code Family Communication: none Disposition Plan:  Status is: Inpatient  Remains inpatient appropriate because:IV treatments appropriate due to intensity of illness or inability to take PO   Dispo: The patient is from: Home              Anticipated d/c is to:  tbd              Patient currently is not medically stable to d/c.   Difficult to place patient No  Vashawn Ekstein, DO [facility] Direct contact: see [email or web address]  7PM-7AM contact night coverage as above 12/20/2020, 1:13 PM  LOS: 1 day

## 2020-12-21 DIAGNOSIS — E119 Type 2 diabetes mellitus without complications: Secondary | ICD-10-CM | POA: Diagnosis not present

## 2020-12-21 DIAGNOSIS — I10 Essential (primary) hypertension: Secondary | ICD-10-CM | POA: Diagnosis not present

## 2020-12-21 DIAGNOSIS — L97429 Non-pressure chronic ulcer of left heel and midfoot with unspecified severity: Secondary | ICD-10-CM | POA: Diagnosis not present

## 2020-12-21 DIAGNOSIS — E11621 Type 2 diabetes mellitus with foot ulcer: Secondary | ICD-10-CM | POA: Diagnosis not present

## 2020-12-21 LAB — VANCOMYCIN, PEAK: Vancomycin Pk: 39 ug/mL (ref 30–40)

## 2020-12-21 LAB — GLUCOSE, CAPILLARY
Glucose-Capillary: 100 mg/dL — ABNORMAL HIGH (ref 70–99)
Glucose-Capillary: 88 mg/dL (ref 70–99)
Glucose-Capillary: 113 mg/dL — ABNORMAL HIGH (ref 70–99)
Glucose-Capillary: 98 mg/dL (ref 70–99)

## 2020-12-21 MED ORDER — ENOXAPARIN SODIUM 80 MG/0.8ML ~~LOC~~ SOLN
0.5000 mg/kg | SUBCUTANEOUS | Status: DC
  Administered 2020-12-21 – 2020-12-28 (×5): 67.5 mg via SUBCUTANEOUS
  Filled 2020-12-21 (×9): qty 0.8

## 2020-12-21 NOTE — Progress Notes (Signed)
Late Entry from Night shift. Patient did well during the night and tolerated antibiotic therapy. Has external male catheter on. Patient complained of no pain or discomfort during the night. Dressing change performed on left foot early this morning. Report given to oncoming Nurse.

## 2020-12-21 NOTE — Progress Notes (Signed)
PROGRESS NOTE    Adrian [name].  UVO:[id number] DOB: 10/06/1961 DOA: 12/17/2020 PCP: [facility], [name]   Chief complaint.  Left foot swelling. Brief Narrative:  The patient is a 60 yr old man who presented to [name] ED on 12/17/2020 on the instruction of his podiatrist Dr. Ether Griffins due to 2 weeks of worsening erythema, swelling and pain associated with a chronic wound of the plantar aspect of the left midfoot.  Patient has been seen by podiatry and vascular surgery, currently covered with vancomycin and Unasyn.  MRI showed acute on chronic osteomyelitis.  Scheduled for BKA on Wednesday.   Assessment & Plan:   Principal Problem:   Diabetic foot ulcer (HCC) Active Problems:   Asthma, chronic   Essential hypertension, benign   Bipolar I disorder (HCC)   Hyperlipidemia, unspecified   DM2 (diabetes mellitus, type 2) (HCC)  #1.  Left foot osteomyelitis. Continue current antibiotics with vancomycin and Unasyn. BKA scheduled on Wednesday.  2.  Essential hypertension Continue current treatment.  3.  Type 2 diabetes. Continue sliding scale insulin coverage.    DVT prophylaxis: Lovenox Code Status: Full Family Communication:  Disposition Plan:  .   Status is: Inpatient  Remains inpatient appropriate because:Inpatient level of care appropriate due to severity of illness   Dispo: The patient is from: Home              Anticipated d/c is to: Home              Patient currently is not medically stable to d/c.   Difficult to place patient No        I/O last 3 completed shifts: In: 1724 [P.O.:1074; IV Piggyback:650] Out: 4200 [Urine:4200] Total I/O In: -  Out: 225 [Urine:225]     Consultants:   Podiatry and vascular surgery.  Procedures: Pending.  Antimicrobials: Unasyn and vancomycin  Subjective: Patient still complaining of left foot swelling, but no pain. Denies any short of breath or cough. No chest pain or palpitation. No fever  chills. No dysuria hematuria.  Objective: Vitals:   12/20/20 1935 12/20/20 2344 12/21/20 0515 12/21/20 0754  BP: (!) 146/73 121/73 121/79 138/83  Pulse: 80 75 69 77  Resp: 16 16 16 18   Temp: 98.4 F (36.9 C) 99.1 F (37.3 C) 98 F (36.7 C) (!) 97.4 F (36.3 C)  TempSrc: Oral Oral    SpO2: 96% 96% 96% 99%  Weight:      Height:        Intake/Output Summary (Last 24 hours) at 12/21/2020 1037 Last data filed at 12/21/2020 0943 Gross per 24 hour  Intake 1487 ml  Output 2825 ml  Net -1338 ml   Filed Weights   12/18/20 0500 12/19/20 0659 12/20/20 0545  Weight: 133.1 kg 131.6 kg 135.1 kg    Examination:  General exam: Appears calm and comfortable  Respiratory system: Clear to auscultation. Respiratory effort normal. Cardiovascular system: S1 & S2 heard, RRR. No JVD, murmurs, rubs, gallops or clicks. No pedal edema. Gastrointestinal system: Abdomen is nondistended, soft and nontender. No organomegaly or masses felt. Normal bowel sounds heard. Central nervous system: Alert and oriented. No focal neurological deficits. Extremities: Left foot ulcer. Skin: No rashes, lesions or ulcers Psychiatry: Judgement and insight appear normal. Mood & affect appropriate.     Data Reviewed: I have personally reviewed following labs and imaging studies  CBC: Recent Labs  Lab 12/17/20 1249 12/18/20 0451 12/20/20 0500  WBC 8.4 8.3 8.5  NEUTROABS 5.6  5.8 6.1  HGB 12.4* 12.1* 11.8*  HCT 36.8* 35.1* 34.5*  MCV 90.4 90.2 90.3  PLT 264 240 245   Basic Metabolic Panel: Recent Labs  Lab 12/17/20 1249 12/18/20 0451 12/19/20 0659 12/20/20 0500  NA 142 144  --  139  K 4.5 4.0  --  4.1  CL 107 110  --  105  CO2 24 25  --  25  GLUCOSE 101* 93  --  112*  BUN 17 16  --  17  CREATININE 1.03 0.90 0.83 0.86  CALCIUM 8.9 8.8*  --  8.7*  MG 2.1 1.9  --   --    GFR: Estimated Creatinine Clearance: 129.8 mL/min (by C-G formula based on SCr of 0.86 mg/dL). Liver Function Tests: Recent Labs   Lab 12/17/20 1249 12/20/20 0500  AST 17 19  ALT 16 15  ALKPHOS 149* 127*  BILITOT 0.5 0.6  PROT 7.4 6.6  ALBUMIN 4.0 3.5   No results for input(s): LIPASE, AMYLASE in the last 168 hours. No results for input(s): AMMONIA in the last 168 hours. Coagulation Profile: Recent Labs  Lab 12/18/20 0451  INR 1.1   Cardiac Enzymes: No results for input(s): CKTOTAL, CKMB, CKMBINDEX, TROPONINI in the last 168 hours. BNP (last 3 results) No results for input(s): PROBNP in the last 8760 hours. HbA1C: No results for input(s): HGBA1C in the last 72 hours. CBG: Recent Labs  Lab 12/20/20 0824 12/20/20 1158 12/20/20 1616 12/20/20 2102 12/21/20 0756  GLUCAP 101* 121* 119* 119* 100*   Lipid Profile: No results for input(s): CHOL, HDL, LDLCALC, TRIG, CHOLHDL, LDLDIRECT in the last 72 hours. Thyroid Function Tests: No results for input(s): TSH, T4TOTAL, FREET4, T3FREE, THYROIDAB in the last 72 hours. Anemia Panel: No results for input(s): VITAMINB12, FOLATE, FERRITIN, TIBC, IRON, RETICCTPCT in the last 72 hours. Sepsis Labs: Recent Labs  Lab 12/17/20 1249  LATICACIDVEN 1.1    Recent Results (from the past 240 hour(s))  Urine Culture     Status: Abnormal   Collection Time: 12/17/20  3:51 PM   Specimen: Urine, Random  Result Value Ref Range Status   Specimen Description   Final    URINE, RANDOM Performed at [facility], [address], [place], [place] [address]    Special Requests   Final    NONE Performed at [facility], [address] [place], [place] [address]    Culture MULTIPLE SPECIES PRESENT, SUGGEST RECOLLECTION (A)  Final   Report Status 12/19/2020 FINAL  Final  SARS CORONAVIRUS 2 (TAT 6-24 HRS) Nasopharyngeal Nasopharyngeal Swab     Status: None   Collection Time: 12/17/20  3:55 PM   Specimen: Nasopharyngeal Swab  Result Value Ref Range Status   SARS Coronavirus 2 NEGATIVE NEGATIVE Final    Comment: (NOTE) SARS-CoV-2 target nucleic acids  are NOT DETECTED.  The SARS-CoV-2 RNA is generally detectable in upper and lower respiratory specimens during the acute phase of infection. Negative results do not preclude SARS-CoV-2 infection, do not rule out co-infections with other pathogens, and should not be used as the sole basis for treatment or other patient management decisions. Negative results must be combined with clinical observations, patient history, and epidemiological information. The expected result is Negative.  Fact Sheet for Patients: HairSlick.no  Fact Sheet for Healthcare Providers: quierodirigir.com  This test is not yet approved or cleared by the [place] FDA and  has been authorized for detection and/or diagnosis of SARS-CoV-2 by FDA under an Emergency Use  Authorization (EUA). This EUA will remain  in effect (meaning this test can be used) for the duration of the COVID-19 declaration under Se ction 564(b)(1) of the Act, 21 U.S.C. section 360bbb-3(b)(1), unless the authorization is terminated or revoked sooner.  Performed at [facility], [address], [place], [place] [address]   Culture, blood (Routine X 2) w Reflex to ID Panel     Status: None (Preliminary result)   Collection Time: 12/17/20  7:17 PM   Specimen: BLOOD  Result Value Ref Range Status   Specimen Description BLOOD BLOOD LEFT FOREARM  Final   Special Requests   Final    BOTTLES DRAWN AEROBIC ONLY Blood Culture results may not be optimal due to an inadequate volume of blood received in culture bottles   Culture   Final    NO GROWTH 4 DAYS Performed at [facility], [address], [place], [place] [address]    Report Status PENDING  Incomplete  Culture, blood (Routine X 2) w Reflex to ID Panel     Status: None (Preliminary result)   Collection Time: 12/17/20  8:42 PM   Specimen: BLOOD  Result Value Ref Range Status   Specimen Description BLOOD BLOOD LEFT HAND   Final   Special Requests   Final    BOTTLES DRAWN AEROBIC AND ANAEROBIC Blood Culture adequate volume   Culture   Final    NO GROWTH 4 DAYS Performed at [facility], [address], [place], [place] [address]    Report Status PENDING  Incomplete         Radiology Studies: No results found.      Scheduled Meds: . atorvastatin  40 mg Oral QHS  . ferrous sulfate  325 mg Oral BID WC  . fluticasone furoate-vilanterol  1 puff Inhalation Daily  . insulin aspart  0-6 Units Subcutaneous TID WC  . levothyroxine  75 mcg Oral QAC breakfast  . montelukast  10 mg Oral Daily  . multivitamin with minerals  1 tablet Oral Daily  . oxcarbazepine  600 mg Oral BID  . pantoprazole  40 mg Oral Q1200  . Ensure Max Protein  11 oz Oral BID BM  . umeclidinium bromide  1 puff Inhalation Daily  . ziprasidone  40 mg Oral BID WC   Continuous Infusions: . ampicillin-sulbactam (UNASYN) IV 3 g (12/21/20 0518)  . vancomycin 1,250 mg (12/21/20 0630)     LOS: 4 days    Time spent: 25 minutes    Marrion Coy, MD [facility]   To contact the attending provider between 7A-7P or the covering provider during after hours 7P-7A, please log into the web site [email or web address] and access using universal [place] password for that web site. If you do not have the password, please call the hospital operator.  12/21/2020, 10:37 AM

## 2020-12-21 NOTE — Progress Notes (Signed)
Pharmacy Antibiotic Note  Adrian [name]. is a 60 y.o. male admitted on 12/17/2020 with worsening necrosis swelling and pain of the left foot.   Pharmacy has been consulted for vancomycin and Unasyn dosing.  Plan:  1) Continue vancomycin 1250 mg IV Q 12 hrs   Goal AUC 400-550  Expected AUC: 455  SCr used: 0.90 mg/dL (from 5.63)  Ke: 1.497 h-1, T1/2: 8.4 h  Css (expected): 29.9 / 12.6 mcg/mL  Daily renal function assessment while on IV vancomycin  Levels as clinically indicated  Am vancomycin dose 3/5 missed - likely not quite at steady state - has received 4 doses since missed dose - will assess peak/trough following 1800 dose today  2) Continue Unasyn 3 grams IV every 6 hours  Height: 5\' 11"  (180.3 cm) Weight: 135.1 kg (297 lb 12.8 oz) IBW/kg (Calculated) : 75.3  Temp (24hrs), Avg:98.3 F (36.8 C), Min:97.4 F (36.3 C), Max:99.1 F (37.3 C)  Recent Labs  Lab 12/17/20 1249 12/18/20 0451 12/19/20 0659 12/20/20 0500  WBC 8.4 8.3  --  8.5  CREATININE 1.03 0.90 0.83 0.86  LATICACIDVEN 1.1  --   --   --     Estimated Creatinine Clearance: 129.8 mL/min (by C-G formula based on SCr of 0.86 mg/dL).    No Known Allergies  Antimicrobials this admission: 03/03 vancomycin >>  03/03 Unasyn >>   Microbiology results: 03/03 BCx: pending 03/03 UCx: pending 03/03 SARS CoV-2: pending   Thank you for allowing pharmacy to be a part of this patients care.  05/03, PharmD, BCPS Clinical Pharmacist 12/21/2020 7:59 AM

## 2020-12-21 NOTE — Plan of Care (Signed)

## 2020-12-22 DIAGNOSIS — I10 Essential (primary) hypertension: Secondary | ICD-10-CM | POA: Diagnosis not present

## 2020-12-22 DIAGNOSIS — E119 Type 2 diabetes mellitus without complications: Secondary | ICD-10-CM | POA: Diagnosis not present

## 2020-12-22 DIAGNOSIS — L97428 Non-pressure chronic ulcer of left heel and midfoot with other specified severity: Secondary | ICD-10-CM | POA: Diagnosis not present

## 2020-12-22 DIAGNOSIS — E11621 Type 2 diabetes mellitus with foot ulcer: Secondary | ICD-10-CM | POA: Diagnosis not present

## 2020-12-22 DIAGNOSIS — L97429 Non-pressure chronic ulcer of left heel and midfoot with unspecified severity: Secondary | ICD-10-CM | POA: Diagnosis not present

## 2020-12-22 LAB — CULTURE, BLOOD (ROUTINE X 2)
Culture: NO GROWTH
Culture: NO GROWTH
Special Requests: ADEQUATE

## 2020-12-22 LAB — GLUCOSE, CAPILLARY
Glucose-Capillary: 81 mg/dL (ref 70–99)
Glucose-Capillary: 109 mg/dL — ABNORMAL HIGH (ref 70–99)
Glucose-Capillary: 94 mg/dL (ref 70–99)
Glucose-Capillary: 111 mg/dL — ABNORMAL HIGH (ref 70–99)

## 2020-12-22 LAB — CREATININE, SERUM
Creatinine, Ser: 0.94 mg/dL (ref 0.61–1.24)
GFR, Estimated: >60 mL/min (ref >60)

## 2020-12-22 LAB — ABO/RH: ABO/RH(D): B POS

## 2020-12-22 LAB — VANCOMYCIN, TROUGH: Vancomycin Tr: 21 ug/mL (ref 15–20)

## 2020-12-22 MED ORDER — VANCOMYCIN HCL 1750 MG/350ML IV SOLN
1750.0000 mg | INTRAVENOUS | Status: DC
  Administered 2020-12-22 – 2020-12-24 (×2): 1750 mg via INTRAVENOUS
  Filled 2020-12-22 (×4): qty 350

## 2020-12-22 MED ORDER — VANCOMYCIN HCL 1750 MG/350ML IV SOLN: 1750.0000 mg | INTRAVENOUS | Status: DC

## 2020-12-22 NOTE — H&P (View-Only) (Signed)
[facility]  Daily Progress Note   Subjective  -   Patient states he feels well.  Not having any fever chills.  States his foot feels okay.  He did note that Mr. Leontine Locket was here yesterday and provided him with information regarding below-knee amputation prostheses  Objective Vitals:   12/22/20 0404 12/22/20 0750 12/22/20 1201 12/22/20 1648  BP: 136/73 140/79 131/71 135/78  Pulse: 66 74 81 79  Resp: 18 18 19 19   Temp: 98.3 F (36.8 C) 98 F (36.7 C) 98.4 F (36.9 C) 98.7 F (37.1 C)  TempSrc: Oral     SpO2: 97% 100% 96% 97%  Weight:      Height:        Intake/Output Summary (Last 24 hours) at 12/22/2020 1713 Last data filed at 12/22/2020 1359 Gross per 24 hour  Intake 830 ml  Output 1975 ml  Net -1145 ml    PULM  CTAB CV  RRR VASC  the left leg is much less edematous than it was on Friday.  His skin up in the calf area is now supple.  He has a palpable popliteal pulse and a palpable DP pulse on the left  Laboratory CBC    Component Value Date/Time   WBC 8.5 12/20/2020 0500   HGB 11.8 (L) 12/20/2020 0500   HGB 9.6 (L) 04/26/2014 0309   HCT 34.5 (L) 12/20/2020 0500   HCT 29.0 (L) 04/26/2014 0309   PLT 245 12/20/2020 0500   PLT 331 04/26/2014 0309    BMET    Component Value Date/Time   NA 139 12/20/2020 0500   NA 143 04/26/2014 0309   K 4.1 12/20/2020 0500   K 4.1 04/26/2014 0309   CL 105 12/20/2020 0500   CL 106 04/26/2014 0309   CO2 25 12/20/2020 0500   CO2 27 04/26/2014 0309   GLUCOSE 112 (H) 12/20/2020 0500   GLUCOSE 100 (H) 04/26/2014 0309   BUN 17 12/20/2020 0500   BUN 11 04/26/2014 0309   CREATININE 0.94 12/22/2020 0535   CREATININE 1.08 04/26/2014 0309   CALCIUM 8.7 (L) 12/20/2020 0500   CALCIUM 8.5 04/26/2014 0309   GFRNONAA >60 12/22/2020 0535   GFRNONAA >60 04/26/2014 0309   GFRAA >60 09/19/2018 1156   GFRAA >60 04/26/2014 0309    Assessment/Planning:   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  He has agreed to move forward and we will plan for surgery tomorrow afternoon.  All of his questions were answered.    06/27/2014  12/22/2020, 5:13 PM

## 2020-12-22 NOTE — Progress Notes (Signed)
PROGRESS NOTE    Adrian [name].  QIO:[id number] DOB: 07/05/1961 DOA: 12/17/2020 PCP: [facility], [facility]   Chief complaint: foot ulcer Brief Narrative:   The patient is a 60 yr old man who presented to [facility] ED on 12/17/2020 on the instruction of his podiatrist Dr. Ether Griffins due to 2 weeks of worsening erythema, swelling and pain associated with a chronic wound of the plantar aspect of the left midfoot.  Patient has been seen by podiatry and vascular surgery, currently covered with vancomycin and Unasyn.  MRI showed acute on chronic osteomyelitis.  Scheduled for BKA on Wednesday.  Assessment & Plan:   Principal Problem:   Diabetic foot ulcer (HCC) Active Problems:   Asthma, chronic   Essential hypertension, benign   Bipolar I disorder (HCC)   Osteomyelitis of left foot (HCC)   Hyperlipidemia, unspecified   DM2 (diabetes mellitus, type 2) (HCC)  #1.  Left foot osteomyelitis Patient has a BKA scheduled on Wednesday tomorrow, I will continue antibiotics for now.  2.  Essential hypertension Continue current treatment.  3.  Type 2 diabetes, Continue current regimen.    DVT prophylaxis: Lovenox Code Status: Full Family Communication:  Disposition Plan:  .   Status is: Inpatient  Remains inpatient appropriate because:Inpatient level of care appropriate due to severity of illness   Dispo: The patient is from: Home              Anticipated d/c is to: Home              Patient currently is not medically stable to d/c.   Difficult to place patient No        I/O last 3 completed shifts: In: 1932.3 [P.O.:720; IV Piggyback:1212.3] Out: 3475 [Urine:3475] No intake/output data recorded.     Consultants:   Vascular  Procedures: pending  Antimicrobials:  Vancomycin and Unasyn Subjective: Patient is doing well today, does not have any pain in the foot.  Denies any short of breath or cough. No abdominal pain nausea vomiting. Objective: Vitals:    12/21/20 2040 12/21/20 2340 12/22/20 0404 12/22/20 0750  BP: 119/74 122/70 136/73 140/79  Pulse: 80 65 66 74  Resp: 18 16 18 18   Temp: 98.5 F (36.9 C) 98.2 F (36.8 C) 98.3 F (36.8 C) 98 F (36.7 C)  TempSrc: Oral Oral Oral   SpO2: 96% 96% 97% 100%  Weight:      Height:        Intake/Output Summary (Last 24 hours) at 12/22/2020 1007 Last data filed at 12/22/2020 0600 Gross per 24 hour  Intake 1002.26 ml  Output 2400 ml  Net -1397.74 ml   Filed Weights   12/18/20 0500 12/19/20 0659 12/20/20 0545  Weight: 133.1 kg 131.6 kg 135.1 kg    Examination:  General exam: Appears calm and comfortable  Respiratory system: Clear to auscultation. Respiratory effort normal. Cardiovascular system: S1 & S2 heard, RRR. No JVD, murmurs, rubs, gallops or clicks. No pedal edema. Gastrointestinal system: Abdomen is nondistended, soft and nontender. No organomegaly or masses felt. Normal bowel sounds heard. Central nervous system: Alert and oriented. No focal neurological deficits. Extremities: Left foot ulcer. Skin: No rashes, lesions or ulcers Psychiatry: Judgement and insight appear normal. Mood & affect appropriate.     Data Reviewed: I have personally reviewed following labs and imaging studies  CBC: Recent Labs  Lab 12/17/20 1249 12/18/20 0451 12/20/20 0500  WBC 8.4 8.3 8.5  NEUTROABS 5.6 5.8 6.1  HGB 12.4* 12.1*  11.8*  HCT 36.8* 35.1* 34.5*  MCV 90.4 90.2 90.3  PLT 264 240 245   Basic Metabolic Panel: Recent Labs  Lab 12/17/20 1249 12/18/20 0451 12/19/20 0659 12/20/20 0500 12/22/20 0535  NA 142 144  --  139  --   K 4.5 4.0  --  4.1  --   CL 107 110  --  105  --   CO2 24 25  --  25  --   GLUCOSE 101* 93  --  112*  --   BUN 17 16  --  17  --   CREATININE 1.03 0.90 0.83 0.86 0.94  CALCIUM 8.9 8.8*  --  8.7*  --   MG 2.1 1.9  --   --   --    GFR: Estimated Creatinine Clearance: 118.7 mL/min (by C-G formula based on SCr of 0.94 mg/dL). Liver Function Tests: Recent  Labs  Lab 12/17/20 1249 12/20/20 0500  AST 17 19  ALT 16 15  ALKPHOS 149* 127*  BILITOT 0.5 0.6  PROT 7.4 6.6  ALBUMIN 4.0 3.5   No results for input(s): LIPASE, AMYLASE in the last 168 hours. No results for input(s): AMMONIA in the last 168 hours. Coagulation Profile: Recent Labs  Lab 12/18/20 0451  INR 1.1   Cardiac Enzymes: No results for input(s): CKTOTAL, CKMB, CKMBINDEX, TROPONINI in the last 168 hours. BNP (last 3 results) No results for input(s): PROBNP in the last 8760 hours. HbA1C: No results for input(s): HGBA1C in the last 72 hours. CBG: Recent Labs  Lab 12/21/20 0756 12/21/20 1134 12/21/20 1613 12/21/20 2222 12/22/20 0750  GLUCAP 100* 88 113* 98 81   Lipid Profile: No results for input(s): CHOL, HDL, LDLCALC, TRIG, CHOLHDL, LDLDIRECT in the last 72 hours. Thyroid Function Tests: No results for input(s): TSH, T4TOTAL, FREET4, T3FREE, THYROIDAB in the last 72 hours. Anemia Panel: No results for input(s): VITAMINB12, FOLATE, FERRITIN, TIBC, IRON, RETICCTPCT in the last 72 hours. Sepsis Labs: Recent Labs  Lab 12/17/20 1249  LATICACIDVEN 1.1    Recent Results (from the past 240 hour(s))  Urine Culture     Status: Abnormal   Collection Time: 12/17/20  3:51 PM   Specimen: Urine, Random  Result Value Ref Range Status   Specimen Description   Final    URINE, RANDOM Performed at [facility], [address], [place], [place] [address]    Special Requests   Final    NONE Performed at [facility], [address] [place], [place] [address]    Culture MULTIPLE SPECIES PRESENT, SUGGEST RECOLLECTION (A)  Final   Report Status 12/19/2020 FINAL  Final  SARS CORONAVIRUS 2 (TAT 6-24 HRS) Nasopharyngeal Nasopharyngeal Swab     Status: None   Collection Time: 12/17/20  3:55 PM   Specimen: Nasopharyngeal Swab  Result Value Ref Range Status   SARS Coronavirus 2 NEGATIVE NEGATIVE Final    Comment: (NOTE) SARS-CoV-2 target nucleic acids  are NOT DETECTED.  The SARS-CoV-2 RNA is generally detectable in upper and lower respiratory specimens during the acute phase of infection. Negative results do not preclude SARS-CoV-2 infection, do not rule out co-infections with other pathogens, and should not be used as the sole basis for treatment or other patient management decisions. Negative results must be combined with clinical observations, patient history, and epidemiological information. The expected result is Negative.  Fact Sheet for Patients: HairSlick.no  Fact Sheet for Healthcare Providers: quierodirigir.com  This test is not yet approved or cleared by  the [employer] and  has been authorized for detection and/or diagnosis of SARS-CoV-2 by FDA under an Emergency Use Authorization (EUA). This EUA will remain  in effect (meaning this test can be used) for the duration of the COVID-19 declaration under Se ction 564(b)(1) of the Act, 21 U.S.C. section 360bbb-3(b)(1), unless the authorization is terminated or revoked sooner.  Performed at [facility], [address] [place], [place] [address]   Culture, blood (Routine X 2) w Reflex to ID Panel     Status: None   Collection Time: 12/17/20  7:17 PM   Specimen: BLOOD  Result Value Ref Range Status   Specimen Description BLOOD BLOOD LEFT FOREARM  Final   Special Requests   Final    BOTTLES DRAWN AEROBIC ONLY Blood Culture results may not be optimal due to an inadequate volume of blood received in culture bottles   Culture   Final    NO GROWTH 5 DAYS Performed at [facility], [address] [place], [place] [address]    Report Status 12/22/2020 FINAL  Final  Culture, blood (Routine X 2) w Reflex to ID Panel     Status: None   Collection Time: 12/17/20  8:42 PM   Specimen: BLOOD  Result Value Ref Range Status   Specimen Description BLOOD BLOOD LEFT HAND  Final   Special Requests   Final     BOTTLES DRAWN AEROBIC AND ANAEROBIC Blood Culture adequate volume   Culture   Final    NO GROWTH 5 DAYS Performed at [facility], [address] [place], [place] [address]    Report Status 12/22/2020 FINAL  Final         Radiology Studies: No results found.      Scheduled Meds: . atorvastatin  40 mg Oral QHS  . enoxaparin (LOVENOX) injection  0.5 mg/kg Subcutaneous Q24H  . ferrous sulfate  325 mg Oral BID WC  . fluticasone furoate-vilanterol  1 puff Inhalation Daily  . insulin aspart  0-6 Units Subcutaneous TID WC  . levothyroxine  75 mcg Oral QAC breakfast  . montelukast  10 mg Oral Daily  . multivitamin with minerals  1 tablet Oral Daily  . oxcarbazepine  600 mg Oral BID  . pantoprazole  40 mg Oral Q1200  . Ensure Max Protein  11 oz Oral BID BM  . umeclidinium bromide  1 puff Inhalation Daily  . ziprasidone  40 mg Oral BID WC   Continuous Infusions: . ampicillin-sulbactam (UNASYN) IV 3 g (12/22/20 0600)  . vancomycin       LOS: 5 days    Time spent: 25 minutes    Marrion Coy, MD [facility]   To contact the attending provider between 7A-7P or the covering provider during after hours 7P-7A, please log into the web site [email or web address] and access using universal [place] password for that web site. If you do not have the password, please call the hospital operator.  12/22/2020, 10:07 AM

## 2020-12-22 NOTE — Progress Notes (Signed)
Pharmacy Antibiotic Note  Adrian [name]. is a 60 y.o. male admitted on 12/17/2020 with worsening necrosis swelling and pain of the left foot.   Pharmacy has been consulted for vancomycin and Unasyn dosing.  Plan:  1) Change Vancomycin to 1750 mg IV Q 24 hrs    Kinetics based on 2-level PK calculator  Previous dosing 1250mg  q12h  Vanc Peak 39, Vanc Trough 21 (after 5th dose)  Ke 0.0669, T1/2 10.4  AUC with these parameters 707.1  Expected levels with updated dosing  Calculated Cmax 38.8  Calculated Cmin 8.9  AUC expected 494.3   Daily renal function assessment while on IV vancomycin  Levels as clinically indicated if vancomycin continues  2) Continue Unasyn 3 grams IV every 6 hours  Height: 5\' 11"  (180.3 cm) Weight: 135.1 kg (297 lb 12.8 oz) IBW/kg (Calculated) : 75.3  Temp (24hrs), Avg:98.2 F (36.8 C), Min:97.4 F (36.3 C), Max:98.5 F (36.9 C)  Recent Labs  Lab 12/17/20 1249 12/18/20 0451 12/19/20 0659 12/20/20 0500 12/21/20 2020 12/22/20 0535  WBC 8.4 8.3  --  8.5  --   --   CREATININE 1.03 0.90 0.83 0.86  --  0.94  LATICACIDVEN 1.1  --   --   --   --   --   VANCOTROUGH  --   --   --   --   --  21*  VANCOPEAK  --   --   --   --  39  --     Estimated Creatinine Clearance: 118.7 mL/min (by C-G formula based on SCr of 0.94 mg/dL).    No Known Allergies  Antimicrobials this admission: 03/03 vancomycin >>  03/03 Unasyn >>   Microbiology results: 03/03 BCx: NG4d 03/03 UCx: inconclusive 03/03 SARS CoV-2: pending   Thank you for allowing pharmacy to be a part of this patient's care.  05/03, PharmD, BCPS Clinical Pharmacist 12/22/2020 7:10 AM

## 2020-12-22 NOTE — Progress Notes (Signed)
[facility]  Daily Progress Note   Subjective  -   Patient states he feels well.  Not having any fever chills.  States his foot feels okay.  He did note that Mr. Michael Neal was here yesterday and provided him with information regarding below-knee amputation prostheses  Objective Vitals:   12/22/20 0404 12/22/20 0750 12/22/20 1201 12/22/20 1648  BP: 136/73 140/79 131/71 135/78  Pulse: 66 74 81 79  Resp: 18 18 19 19  Temp: 98.3 F (36.8 C) 98 F (36.7 C) 98.4 F (36.9 C) 98.7 F (37.1 C)  TempSrc: Oral     SpO2: 97% 100% 96% 97%  Weight:      Height:        Intake/Output Summary (Last 24 hours) at 12/22/2020 1713 Last data filed at 12/22/2020 1359 Gross per 24 hour  Intake 830 ml  Output 1975 ml  Net -1145 ml    PULM  CTAB CV  RRR VASC  the left leg is much less edematous than it was on Friday.  His skin up in the calf area is now supple.  He has a palpable popliteal pulse and a palpable DP pulse on the left  Laboratory CBC    Component Value Date/Time   WBC 8.5 12/20/2020 0500   HGB 11.8 (L) 12/20/2020 0500   HGB 9.6 (L) 04/26/2014 0309   HCT 34.5 (L) 12/20/2020 0500   HCT 29.0 (L) 04/26/2014 0309   PLT 245 12/20/2020 0500   PLT 331 04/26/2014 0309    BMET    Component Value Date/Time   NA 139 12/20/2020 0500   NA 143 04/26/2014 0309   K 4.1 12/20/2020 0500   K 4.1 04/26/2014 0309   CL 105 12/20/2020 0500   CL 106 04/26/2014 0309   CO2 25 12/20/2020 0500   CO2 27 04/26/2014 0309   GLUCOSE 112 (H) 12/20/2020 0500   GLUCOSE 100 (H) 04/26/2014 0309   BUN 17 12/20/2020 0500   BUN 11 04/26/2014 0309   CREATININE 0.94 12/22/2020 0535   CREATININE 1.08 04/26/2014 0309   CALCIUM 8.7 (L) 12/20/2020 0500   CALCIUM 8.5 04/26/2014 0309   GFRNONAA >60 12/22/2020 0535   GFRNONAA >60 04/26/2014 0309   GFRAA >60 09/19/2018 1156   GFRAA >60 04/26/2014 0309    Assessment/Planning:   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  He has agreed to move forward and we will plan for surgery tomorrow afternoon.  All of his questions were answered.    Adrian [name]  12/22/2020, 5:13 PM     

## 2020-12-23 ENCOUNTER — Encounter
Admission: EM | Disposition: A | Discharge status: Skilled Nursing Facility | Payer: Self-pay | Source: Home / Self Care | Attending: Internal Medicine

## 2020-12-23 ENCOUNTER — Inpatient Hospital Stay: Payer: Medicare Other | Admitting: Certified Registered Nurse Anesthetist

## 2020-12-23 ENCOUNTER — Encounter: Payer: Self-pay | Admitting: Internal Medicine

## 2020-12-23 DIAGNOSIS — E119 Type 2 diabetes mellitus without complications: Secondary | ICD-10-CM | POA: Diagnosis not present

## 2020-12-23 DIAGNOSIS — M86172 Other acute osteomyelitis, left ankle and foot: Secondary | ICD-10-CM | POA: Diagnosis not present

## 2020-12-23 DIAGNOSIS — M86672 Other chronic osteomyelitis, left ankle and foot: Secondary | ICD-10-CM

## 2020-12-23 DIAGNOSIS — M14672 Charcot's joint, left ankle and foot: Secondary | ICD-10-CM

## 2020-12-23 DIAGNOSIS — I10 Essential (primary) hypertension: Secondary | ICD-10-CM | POA: Diagnosis not present

## 2020-12-23 HISTORY — PX: AMPUTATION: SHX166

## 2020-12-23 LAB — BASIC METABOLIC PANEL
Anion gap: 8 (ref 5–15)
BUN: 32 mg/dL — ABNORMAL HIGH (ref 6–20)
CO2: 26 mmol/L (ref 22–32)
Calcium: 9.1 mg/dL (ref 8.9–10.3)
Chloride: 108 mmol/L (ref 98–111)
Creatinine, Ser: 0.97 mg/dL (ref 0.61–1.24)
GFR, Estimated: >60 mL/min (ref >60)
Glucose, Bld: 105 mg/dL — ABNORMAL HIGH (ref 70–99)
Potassium: 4.5 mmol/L (ref 3.5–5.1)
Sodium: 142 mmol/L (ref 135–145)

## 2020-12-23 LAB — CBC
HCT: 37 % — ABNORMAL LOW (ref 39.0–52.0)
Hemoglobin: 12.3 g/dL — ABNORMAL LOW (ref 13.0–17.0)
MCH: 30.3 pg (ref 26.0–34.0)
MCHC: 33.2 g/dL (ref 30.0–36.0)
MCV: 91.1 fL (ref 80.0–100.0)
Platelets: 258 K/uL (ref 150–400)
RBC: 4.06 MIL/uL — ABNORMAL LOW (ref 4.22–5.81)
RDW: 13.5 % (ref 11.5–15.5)
WBC: 8.3 K/uL (ref 4.0–10.5)
nRBC: 0 % (ref 0.0–0.2)

## 2020-12-23 LAB — GLUCOSE, CAPILLARY
Glucose-Capillary: 100 mg/dL — ABNORMAL HIGH (ref 70–99)
Glucose-Capillary: 82 mg/dL (ref 70–99)
Glucose-Capillary: 118 mg/dL — ABNORMAL HIGH (ref 70–99)

## 2020-12-23 SURGERY — AMPUTATION BELOW KNEE
Anesthesia: General | Site: Knee | Laterality: Left

## 2020-12-23 MED ORDER — FENTANYL CITRATE (PF) 100 MCG/2ML IJ SOLN
INTRAMUSCULAR | Status: AC
  Administered 2020-12-23: 50 ug via INTRAVENOUS
  Filled 2020-12-23: qty 2

## 2020-12-23 MED ORDER — KETAMINE HCL 10 MG/ML IJ SOLN
INTRAMUSCULAR | Status: DC | PRN
  Administered 2020-12-23: 10 mg via INTRAVENOUS
  Administered 2020-12-23: 20 mg via INTRAVENOUS
  Administered 2020-12-23: 10 mg via INTRAVENOUS

## 2020-12-23 MED ORDER — ROCURONIUM BROMIDE 100 MG/10ML IV SOLN
INTRAVENOUS | Status: DC | PRN
  Administered 2020-12-23 (×2): 20 mg via INTRAVENOUS
  Administered 2020-12-23: 30 mg via INTRAVENOUS

## 2020-12-23 MED ORDER — FENTANYL CITRATE (PF) 100 MCG/2ML IJ SOLN
INTRAMUSCULAR | Status: AC
  Filled 2020-12-23: qty 2

## 2020-12-23 MED ORDER — ONDANSETRON HCL 4 MG/2ML IJ SOLN
INTRAMUSCULAR | Status: DC | PRN
  Administered 2020-12-23: 4 mg via INTRAVENOUS

## 2020-12-23 MED ORDER — MIDAZOLAM HCL 2 MG/2ML IJ SOLN
INTRAMUSCULAR | Status: DC | PRN
  Administered 2020-12-23: 2 mg via INTRAVENOUS

## 2020-12-23 MED ORDER — HYDROMORPHONE HCL 1 MG/ML IJ SOLN: 0.5000 mg | INTRAMUSCULAR | Status: DC | PRN

## 2020-12-23 MED ORDER — MIDAZOLAM HCL 2 MG/2ML IJ SOLN
INTRAMUSCULAR | Status: AC
  Filled 2020-12-23: qty 2

## 2020-12-23 MED ORDER — FENTANYL CITRATE (PF) 100 MCG/2ML IJ SOLN
INTRAMUSCULAR | Status: DC | PRN
  Administered 2020-12-23 (×2): 50 ug via INTRAVENOUS

## 2020-12-23 MED ORDER — SODIUM CHLORIDE 0.9 % IV SOLN: INTRAVENOUS | Status: DC

## 2020-12-23 MED ORDER — HYDROMORPHONE HCL 1 MG/ML IJ SOLN
INTRAMUSCULAR | Status: AC
  Administered 2020-12-23: 0.5 mg via INTRAVENOUS
  Filled 2020-12-23: qty 1

## 2020-12-23 MED ORDER — DEXAMETHASONE SODIUM PHOSPHATE 10 MG/ML IJ SOLN
INTRAMUSCULAR | Status: AC
  Filled 2020-12-23: qty 1

## 2020-12-23 MED ORDER — PROMETHAZINE HCL 25 MG/ML IJ SOLN
6.2500 mg | INTRAMUSCULAR | Status: DC | PRN
Start: 2020-12-23 — End: 2020-12-23

## 2020-12-23 MED ORDER — BUPIVACAINE LIPOSOME 1.3 % IJ SUSP
INTRAMUSCULAR | Status: AC
  Filled 2020-12-23: qty 20

## 2020-12-23 MED ORDER — SUGAMMADEX SODIUM 500 MG/5ML IV SOLN
INTRAVENOUS | Status: DC | PRN
  Administered 2020-12-23: 300 mg via INTRAVENOUS

## 2020-12-23 MED ORDER — SODIUM CHLORIDE FLUSH 0.9 % IV SOLN
INTRAVENOUS | Status: DC | PRN
  Administered 2020-12-23: 100 mL

## 2020-12-23 MED ORDER — OXYCODONE HCL 5 MG/5ML PO SOLN: 5.0000 mg | Freq: Once | ORAL | Status: DC | PRN

## 2020-12-23 MED ORDER — LIDOCAINE HCL (PF) 2 % IJ SOLN
INTRAMUSCULAR | Status: AC
  Filled 2020-12-23: qty 5

## 2020-12-23 MED ORDER — LIDOCAINE HCL (CARDIAC) PF 100 MG/5ML IV SOSY
PREFILLED_SYRINGE | INTRAVENOUS | Status: DC | PRN
  Administered 2020-12-23: 100 mg via INTRAVENOUS

## 2020-12-23 MED ORDER — PROPOFOL 10 MG/ML IV BOLUS
INTRAVENOUS | Status: AC
  Filled 2020-12-23: qty 20

## 2020-12-23 MED ORDER — OXYCODONE HCL 5 MG PO TABS: 5.0000 mg | ORAL_TABLET | Freq: Once | ORAL | Status: DC | PRN

## 2020-12-23 MED ORDER — SUCCINYLCHOLINE CHLORIDE 20 MG/ML IJ SOLN
INTRAMUSCULAR | Status: DC | PRN
  Administered 2020-12-23: 140 mg via INTRAVENOUS

## 2020-12-23 MED ORDER — ONDANSETRON HCL 4 MG/2ML IJ SOLN
INTRAMUSCULAR | Status: AC
  Filled 2020-12-23: qty 2

## 2020-12-23 MED ORDER — BUPIVACAINE HCL (PF) 0.5 % IJ SOLN
INTRAMUSCULAR | Status: AC
  Filled 2020-12-23: qty 30

## 2020-12-23 MED ORDER — PROPOFOL 10 MG/ML IV BOLUS
INTRAVENOUS | Status: DC | PRN
  Administered 2020-12-23: 150 mg via INTRAVENOUS
  Administered 2020-12-23: 50 mg via INTRAVENOUS

## 2020-12-23 MED ORDER — KETAMINE HCL 50 MG/5ML IJ SOSY
PREFILLED_SYRINGE | INTRAMUSCULAR | Status: AC
  Filled 2020-12-23: qty 5

## 2020-12-23 MED ORDER — GLYCOPYRROLATE 0.2 MG/ML IJ SOLN
INTRAMUSCULAR | Status: AC
  Filled 2020-12-23: qty 1

## 2020-12-23 MED ORDER — FENTANYL CITRATE (PF) 100 MCG/2ML IJ SOLN
25.0000 ug | INTRAMUSCULAR | Status: DC | PRN
  Administered 2020-12-23: 50 ug via INTRAVENOUS

## 2020-12-23 MED ORDER — SODIUM CHLORIDE (PF) 0.9 % IJ SOLN
INTRAMUSCULAR | Status: AC
  Filled 2020-12-23: qty 50

## 2020-12-23 MED ORDER — HYDROMORPHONE HCL 1 MG/ML IJ SOLN
INTRAMUSCULAR | Status: DC | PRN
  Administered 2020-12-23: .25 mg via INTRAVENOUS
  Administered 2020-12-23: .5 mg via INTRAVENOUS
  Administered 2020-12-23: .25 mg via INTRAVENOUS

## 2020-12-23 MED ORDER — HYDROMORPHONE HCL 1 MG/ML IJ SOLN
INTRAMUSCULAR | Status: AC
  Filled 2020-12-23: qty 1

## 2020-12-23 MED ORDER — DEXMEDETOMIDINE (PRECEDEX) IN NS 20 MCG/5ML (4 MCG/ML) IV SYRINGE
PREFILLED_SYRINGE | INTRAVENOUS | Status: DC | PRN
  Administered 2020-12-23: 4 ug via INTRAVENOUS
  Administered 2020-12-23: 8 ug via INTRAVENOUS

## 2020-12-23 MED ORDER — MEPERIDINE HCL 50 MG/ML IJ SOLN: 6.2500 mg | INTRAMUSCULAR | Status: DC | PRN

## 2020-12-23 MED ORDER — DEXAMETHASONE SODIUM PHOSPHATE 10 MG/ML IJ SOLN
INTRAMUSCULAR | Status: DC | PRN
  Administered 2020-12-23: 5 mg via INTRAVENOUS

## 2020-12-23 SURGICAL SUPPLY — 53 items
BAG COUNTER SPONGE EZ (MISCELLANEOUS) ×2 IMPLANT
BLADE SAGITTAL WIDE XTHICK NO (BLADE) ×2 IMPLANT
BLADE SURG SZ10 CARB STEEL (BLADE) ×2 IMPLANT
BNDG COHESIVE 4X5 TAN STRL (GAUZE/BANDAGES/DRESSINGS) ×2 IMPLANT
BNDG ELASTIC 4X5.8 VLCR NS LF (GAUZE/BANDAGES/DRESSINGS) ×2 IMPLANT
BNDG GAUZE 4.5X4.1 6PLY STRL (MISCELLANEOUS) ×2 IMPLANT
BRUSH SCRUB EZ  4% CHG (MISCELLANEOUS) ×1
BRUSH SCRUB EZ 4% CHG (MISCELLANEOUS) ×1 IMPLANT
CANISTER SUCT 1200ML W/VALVE (MISCELLANEOUS) ×2 IMPLANT
CANISTER WOUND CARE 500ML ATS (WOUND CARE) IMPLANT
CHLORAPREP W/TINT 26 (MISCELLANEOUS) ×2 IMPLANT
COVER WAND RF STERILE (DRAPES) ×2 IMPLANT
DERMABOND ADVANCED (GAUZE/BANDAGES/DRESSINGS) ×2
DERMABOND ADVANCED .7 DNX12 (GAUZE/BANDAGES/DRESSINGS) ×2 IMPLANT
DRAPE INCISE IOBAN 66X45 STRL (DRAPES) ×2 IMPLANT
DRSG GAUZE FLUFF 36X18 (GAUZE/BANDAGES/DRESSINGS) ×2 IMPLANT
DRSG VAC ATS MED SENSATRAC (GAUZE/BANDAGES/DRESSINGS) IMPLANT
ELECT CAUTERY BLADE 6.4 (BLADE) ×2 IMPLANT
ELECT REM PT RETURN 9FT ADLT (ELECTROSURGICAL) ×2
ELECTRODE REM PT RTRN 9FT ADLT (ELECTROSURGICAL) ×1 IMPLANT
GLOVE SURG ENC MOIS LTX SZ7 (GLOVE) ×2 IMPLANT
GLOVE SURG SYN 8.0 (GLOVE) ×2 IMPLANT
GLOVE SURG SYN 8.0 PF PI (GLOVE) ×1 IMPLANT
GLOVE SURG UNDER LTX SZ7.5 (GLOVE) ×2 IMPLANT
GOWN STRL REUS W/ TWL LRG LVL3 (GOWN DISPOSABLE) ×1 IMPLANT
GOWN STRL REUS W/ TWL XL LVL3 (GOWN DISPOSABLE) ×2 IMPLANT
GOWN STRL REUS W/TWL LRG LVL3 (GOWN DISPOSABLE) ×1
GOWN STRL REUS W/TWL XL LVL3 (GOWN DISPOSABLE) ×2
HANDLE YANKAUER SUCT BULB TIP (MISCELLANEOUS) ×2 IMPLANT
KIT TURNOVER KIT A (KITS) ×2 IMPLANT
LABEL OR SOLS (LABEL) ×2 IMPLANT
MANIFOLD NEPTUNE II (INSTRUMENTS) ×2 IMPLANT
NS IRRIG 500ML POUR BTL (IV SOLUTION) ×2 IMPLANT
PACK EXTREMITY ARMC (MISCELLANEOUS) ×2 IMPLANT
PAD ABD DERMACEA PRESS 5X9 (GAUZE/BANDAGES/DRESSINGS) ×3 IMPLANT
PAD PREP 24X41 OB/GYN DISP (PERSONAL CARE ITEMS) ×1 IMPLANT
SPONGE LAP 18X18 RF (DISPOSABLE) ×5 IMPLANT
STAPLER SKIN PROX 35W (STAPLE) IMPLANT
STOCKINETTE M/LG 89821 (MISCELLANEOUS) ×2 IMPLANT
SUT MNCRL 4-0 (SUTURE) ×1
SUT MNCRL 4-0 27XMFL (SUTURE) ×1
SUT SILK 2 0 (SUTURE) ×1
SUT SILK 2-0 18XBRD TIE 12 (SUTURE) ×1 IMPLANT
SUT SILK 3 0 (SUTURE) ×1
SUT SILK 3-0 18XBRD TIE 12 (SUTURE) ×1 IMPLANT
SUT VIC AB 0 CT1 36 (SUTURE) ×15 IMPLANT
SUT VIC AB 3-0 SH 27 (SUTURE) ×3
SUT VIC AB 3-0 SH 27X BRD (SUTURE) ×2 IMPLANT
SUT VICRYL PLUS ABS 0 54 (SUTURE) ×1 IMPLANT
SUTURE MNCRL 4-0 27XMF (SUTURE) ×1 IMPLANT
SYR 20ML LL LF (SYRINGE) ×4 IMPLANT
TAPE UMBIL 1/8X18 RADIOPA (MISCELLANEOUS) ×1 IMPLANT
TOWEL OR 17X26 4PK STRL BLUE (TOWEL DISPOSABLE) ×2 IMPLANT

## 2020-12-23 NOTE — Interval H&P Note (Signed)
History and Physical Interval Note:  12/23/2020 1:24 PM  Adrian [name].  has presented today for surgery, with the diagnosis of OSTEOMYELITIS OF LEFT FOOT.  The various methods of treatment have been discussed with the patient and family. After consideration of risks, benefits and other options for treatment, the patient has consented to  Procedure(s): AMPUTATION BELOW KNEE (Left) as a surgical intervention.  The patient's history has been reviewed, patient examined, no change in status, stable for surgery.  I have reviewed the patient's chart and labs.  Questions were answered to the patient's satisfaction.     Adrian [name]

## 2020-12-23 NOTE — Transfer of Care (Signed)
Immediate Anesthesia Transfer of Care Note  Patient: Adrian [name].  Procedure(s) Performed: AMPUTATION BELOW KNEE (Left Knee)  Patient Location: PACU  Anesthesia Type:General  Level of Consciousness: drowsy and patient cooperative  Airway & Oxygen Therapy: Patient Spontanous Breathing  Post-op Assessment: Report given to RN and Post -op Vital signs reviewed and stable  Post vital signs: Reviewed and stable  Last Vitals:  Vitals Value Taken Time  BP 156/98 12/23/20 1630  Temp    Pulse 92 12/23/20 1631  Resp 18 12/23/20 1631  SpO2 96 % 12/23/20 1631  Vitals shown include unvalidated device data.  Last Pain:  Vitals:   12/23/20 1341  TempSrc: Oral  PainSc: 0-No pain         Complications: No complications documented.

## 2020-12-23 NOTE — Anesthesia Procedure Notes (Signed)
Procedure Name: Intubation Date/Time: 12/23/2020 2:13 PM Performed by: Joanette Gula, Taisei Bonnette, CRNA Pre-anesthesia Checklist: Patient identified, Emergency Drugs available, Suction available and Patient being monitored Patient Re-evaluated:Patient Re-evaluated prior to induction Oxygen Delivery Method: Circle system utilized Preoxygenation: Pre-oxygenation with 100% oxygen Induction Type: IV induction Ventilation: Mask ventilation without difficulty Laryngoscope Size: McGraph and 4 Grade View: Grade I Tube type: Oral Number of attempts: 1 Airway Equipment and Method: Stylet Placement Confirmation: ETT inserted through vocal cords under direct vision,  positive ETCO2 and breath sounds checked- equal and bilateral Secured at: 22 cm Tube secured with: Tape Dental Injury: Teeth and Oropharynx as per pre-operative assessment

## 2020-12-23 NOTE — Progress Notes (Signed)
PROGRESS NOTE    Adrian [name].  QQV:[id number] DOB: 1961/09/24 DOA: 12/17/2020 PCP: [facility]   Chief complaint.  Left foot ulcer. Brief Narrative:  The patient is a 60 yr old man who presented to [facility] ED on 12/17/2020 on the instruction of his podiatrist Dr. Ether Griffins due to 2 weeks of worsening erythema, swelling and pain associated with a chronic wound of the plantar aspect of the left midfoot. Patient has been seen by podiatry and vascular surgery, currently covered with vancomycin and Unasyn. MRI showed acute on chronic osteomyelitis. Scheduled for BKA on Wednesday.    Assessment & Plan:   Principal Problem:   Diabetic foot ulcer (HCC) Active Problems:   Asthma, chronic   Essential hypertension, benign   Bipolar I disorder (HCC)   Osteomyelitis of left foot (HCC)   Hyperlipidemia, unspecified   DM2 (diabetes mellitus, type 2) (HCC)  #1.  Left foot osteomyelitis. Patient condition stable, he is scheduled for BKA today.  Continue vancomycin and Unasyn.  2.  essential hypertension. Continue current treatment.  3.  Type 2 diabetes. Continue current regimen, will adjust the medication postop.    DVT prophylaxis: Lovenox Code Status: Full Family Communication:  Disposition Plan:  .   Status is: Inpatient  Remains inpatient appropriate because:Inpatient level of care appropriate due to severity of illness   Dispo: The patient is from: Home              Anticipated d/c is to: Home              Patient currently is not medically stable to d/c.   Difficult to place patient No        I/O last 3 completed shifts: In: 1280 [P.O.:480; IV Piggyback:800] Out: 5050 [Urine:5050] No intake/output data recorded.     Consultants:   Vascular   Procedures: BKA pending  Antimicrobials:  Unasyn and vancomycin  Subjective: Patient is doing well today, he slept well last night.  Currently he does not have any short of breath or cough.   He does not experience any pain in his foot. No fever or chills. No nausea vomiting abdominal pain.  He had some soft stools yesterday. No dysuria hematuria   Objective: Vitals:   12/22/20 2358 12/23/20 0100 12/23/20 0511 12/23/20 0821  BP: 117/69  137/77 139/76  Pulse: 77  69 78  Resp: 16  15 17   Temp: 97.7 F (36.5 C)  97.7 F (36.5 C) 98.1 F (36.7 C)  TempSrc:    Oral  SpO2: 96%  97% 98%  Weight:  132.8 kg    Height:        Intake/Output Summary (Last 24 hours) at 12/23/2020 0911 Last data filed at 12/23/2020 0500 Gross per 24 hour  Intake 930 ml  Output 3400 ml  Net -2470 ml   Filed Weights   12/19/20 0659 12/20/20 0545 12/23/20 0100  Weight: 131.6 kg 135.1 kg 132.8 kg    Examination:  General exam: Appears calm and comfortable  Respiratory system: Clear to auscultation. Respiratory effort normal. Cardiovascular system: S1 & S2 heard, RRR. No JVD, murmurs, rubs, gallops or clicks. No pedal edema. Gastrointestinal system: Abdomen is nondistended, soft and nontender. No organomegaly or masses felt. Normal bowel sounds heard. Central nervous system: Alert and oriented. No focal neurological deficits. Extremities: Left foot ulcer Skin: No rashes, lesions or ulcers Psychiatry: Judgement and insight appear normal. Mood & affect appropriate.     Data Reviewed: I have  personally reviewed following labs and imaging studies  CBC: Recent Labs  Lab 12/17/20 1249 12/18/20 0451 12/20/20 0500 12/23/20 0655  WBC 8.4 8.3 8.5 8.3  NEUTROABS 5.6 5.8 6.1  --   HGB 12.4* 12.1* 11.8* 12.3*  HCT 36.8* 35.1* 34.5* 37.0*  MCV 90.4 90.2 90.3 91.1  PLT 264 240 245 258   Basic Metabolic Panel: Recent Labs  Lab 12/17/20 1249 12/18/20 0451 12/19/20 0659 12/20/20 0500 12/22/20 0535 12/23/20 0655  NA 142 144  --  139  --  142  K 4.5 4.0  --  4.1  --  4.5  CL 107 110  --  105  --  108  CO2 24 25  --  25  --  26  GLUCOSE 101* 93  --  112*  --  105*  BUN 17 16  --  17  --   32*  CREATININE 1.03 0.90 0.83 0.86 0.94 0.97  CALCIUM 8.9 8.8*  --  8.7*  --  9.1  MG 2.1 1.9  --   --   --   --    GFR: Estimated Creatinine Clearance: 114 mL/min (by C-G formula based on SCr of 0.97 mg/dL). Liver Function Tests: Recent Labs  Lab 12/17/20 1249 12/20/20 0500  AST 17 19  ALT 16 15  ALKPHOS 149* 127*  BILITOT 0.5 0.6  PROT 7.4 6.6  ALBUMIN 4.0 3.5   No results for input(s): LIPASE, AMYLASE in the last 168 hours. No results for input(s): AMMONIA in the last 168 hours. Coagulation Profile: Recent Labs  Lab 12/18/20 0451  INR 1.1   Cardiac Enzymes: No results for input(s): CKTOTAL, CKMB, CKMBINDEX, TROPONINI in the last 168 hours. BNP (last 3 results) No results for input(s): PROBNP in the last 8760 hours. HbA1C: No results for input(s): HGBA1C in the last 72 hours. CBG: Recent Labs  Lab 12/22/20 0750 12/22/20 1159 12/22/20 1627 12/22/20 2126 12/23/20 0811  GLUCAP 81 109* 94 111* 100*   Lipid Profile: No results for input(s): CHOL, HDL, LDLCALC, TRIG, CHOLHDL, LDLDIRECT in the last 72 hours. Thyroid Function Tests: No results for input(s): TSH, T4TOTAL, FREET4, T3FREE, THYROIDAB in the last 72 hours. Anemia Panel: No results for input(s): VITAMINB12, FOLATE, FERRITIN, TIBC, IRON, RETICCTPCT in the last 72 hours. Sepsis Labs: Recent Labs  Lab 12/17/20 1249  LATICACIDVEN 1.1    Recent Results (from the past 240 hour(s))  Urine Culture     Status: Abnormal   Collection Time: 12/17/20  3:51 PM   Specimen: Urine, Random  Result Value Ref Range Status   Specimen Description   Final    URINE, RANDOM Performed at [facility], [address], [place], [place] [address]    Special Requests   Final    NONE Performed at [facility] [address] [place], [place] [address]    Culture MULTIPLE SPECIES PRESENT, SUGGEST RECOLLECTION (A)  Final   Report Status 12/19/2020 FINAL  Final  SARS CORONAVIRUS 2 (TAT 6-24 HRS)  Nasopharyngeal Nasopharyngeal Swab     Status: None   Collection Time: 12/17/20  3:55 PM   Specimen: Nasopharyngeal Swab  Result Value Ref Range Status   SARS Coronavirus 2 NEGATIVE NEGATIVE Final    Comment: (NOTE) SARS-CoV-2 target nucleic acids are NOT DETECTED.  The SARS-CoV-2 RNA is generally detectable in upper and lower respiratory specimens during the acute phase of infection. Negative results do not preclude SARS-CoV-2 infection, do not rule out co-infections with other pathogens, and  should not be used as the sole basis for treatment or other patient management decisions. Negative results must be combined with clinical observations, patient history, and epidemiological information. The expected result is Negative.  Fact Sheet for Patients: HairSlick.no  Fact Sheet for Healthcare Providers: quierodirigir.com  This test is not yet approved or cleared by the [place] FDA and  has been authorized for detection and/or diagnosis of SARS-CoV-2 by FDA under an Emergency Use Authorization (EUA). This EUA will remain  in effect (meaning this test can be used) for the duration of the COVID-19 declaration under Se ction 564(b)(1) of the Act, 21 U.S.C. section 360bbb-3(b)(1), unless the authorization is terminated or revoked sooner.  Performed at [facility], [address] [place], [place] [address]   Culture, blood (Routine X 2) w Reflex to ID Panel     Status: None   Collection Time: 12/17/20  7:17 PM   Specimen: BLOOD  Result Value Ref Range Status   Specimen Description BLOOD BLOOD LEFT FOREARM  Final   Special Requests   Final    BOTTLES DRAWN AEROBIC ONLY Blood Culture results may not be optimal due to an inadequate volume of blood received in culture bottles   Culture   Final    NO GROWTH 5 DAYS Performed at [facility], [address] [place], [place] [address]    Report Status 12/22/2020  FINAL  Final  Culture, blood (Routine X 2) w Reflex to ID Panel     Status: None   Collection Time: 12/17/20  8:42 PM   Specimen: BLOOD  Result Value Ref Range Status   Specimen Description BLOOD BLOOD LEFT HAND  Final   Special Requests   Final    BOTTLES DRAWN AEROBIC AND ANAEROBIC Blood Culture adequate volume   Culture   Final    NO GROWTH 5 DAYS Performed at [facility], [address], [place], [place] [address]    Report Status 12/22/2020 FINAL  Final         Radiology Studies: No results found.      Scheduled Meds: . atorvastatin  40 mg Oral QHS  . enoxaparin (LOVENOX) injection  0.5 mg/kg Subcutaneous Q24H  . ferrous sulfate  325 mg Oral BID WC  . fluticasone furoate-vilanterol  1 puff Inhalation Daily  . insulin aspart  0-6 Units Subcutaneous TID WC  . levothyroxine  75 mcg Oral QAC breakfast  . montelukast  10 mg Oral Daily  . multivitamin with minerals  1 tablet Oral Daily  . oxcarbazepine  600 mg Oral BID  . pantoprazole  40 mg Oral Q1200  . Ensure Max Protein  11 oz Oral BID BM  . umeclidinium bromide  1 puff Inhalation Daily  . ziprasidone  40 mg Oral BID WC   Continuous Infusions: . ampicillin-sulbactam (UNASYN) IV 3 g (12/23/20 0535)  . vancomycin 1,750 mg (12/22/20 1623)     LOS: 6 days    Time spent: 25 minutes    Marrion Coy, MD [facility]   To contact the attending provider between 7A-7P or the covering provider during after hours 7P-7A, please log into the web site [email or web address] and access using universal [place] password for that web site. If you do not have the password, please call the hospital operator.  12/23/2020, 9:11 AM

## 2020-12-23 NOTE — Anesthesia Postprocedure Evaluation (Signed)
Anesthesia Post Note  Patient: Adrian [name].  Procedure(s) Performed: AMPUTATION BELOW KNEE (Left Knee)  Patient location during evaluation: PACU Anesthesia Type: General Level of consciousness: awake and alert Pain management: pain level controlled Vital Signs Assessment: post-procedure vital signs reviewed and stable Respiratory status: spontaneous breathing and respiratory function stable Cardiovascular status: stable Anesthetic complications: no   No complications documented.   Last Vitals:  Vitals:   12/23/20 1701 12/23/20 1703  BP:  (!) 154/87  Pulse: 83 83  Resp: 18 17  Temp:  37.5 C  SpO2: 100% 100%    Last Pain:  Vitals:   12/23/20 1701  TempSrc:   PainSc: 4                  [name]

## 2020-12-23 NOTE — Progress Notes (Signed)
Patient resting in bed, with call bell in reach. Spoke with pre-op nurse gave her report on patient. She stated that procedure is at 2pm and they would send for him at 12:30; 1pm. Gave CHG bath to patient. No concerns of pain or distress. Bed in low position.

## 2020-12-23 NOTE — Anesthesia Preprocedure Evaluation (Signed)
Anesthesia Evaluation  Patient identified by MRN, date of birth, ID band Patient awake    Reviewed: Allergy & Precautions, NPO status , Patient's Chart, lab work & pertinent test results  History of Anesthesia Complications Negative for: history of anesthetic complications  Airway Mallampati: III  TM Distance: >3 FB Neck ROM: Full    Dental  (+) Poor Dentition, Missing   Pulmonary asthma , sleep apnea and Continuous Positive Airway Pressure Ventilation , former smoker,    breath sounds clear to auscultation- rhonchi (-) wheezing      Cardiovascular hypertension, Pt. on medications (-) CAD, (-) Past MI, (-) Cardiac Stents and (-) CABG  Rhythm:Regular Rate:Normal - Systolic murmurs and - Diastolic murmurs    Neuro/Psych PSYCHIATRIC DISORDERS Depression Bipolar Disorder Schizophrenia CVA (L hand weakness), Residual Symptoms    GI/Hepatic negative GI ROS, Neg liver ROS,   Endo/Other  diabetesHypothyroidism   Renal/GU negative Renal ROS     Musculoskeletal  (+) Arthritis ,   Abdominal (+) + obese,   Peds  Hematology  (+) anemia ,   Anesthesia Other Findings Past Medical History: No date: Anemia No date: Asthma No date: Bipolar disorder (HCC) No date: Charcot foot due to diabetes mellitus (HCC) No date: Depression 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 No date: Diabetes mellitus without complication (HCC) No date: Eczema No date: Hyperlipemia No date: Hypertension No date: Hypothyroidism No date: Neuropathy No date: Psoriasis No date: Psychosis (HCC) No date: Respiratory failure (HCC) No date: Sleep apnea, obstructive No date: Stroke [facility])     Comment:  left hand weakness   Reproductive/Obstetrics                             Anesthesia Physical Anesthesia Plan  ASA: III  Anesthesia Plan: General   Post-op Pain Management:    Induction: Intravenous  PONV Risk Score and Plan: 1 and  Ondansetron  Airway Management Planned: Oral ETT  Additional Equipment:   Intra-op Plan:   Post-operative Plan: Extubation in OR  Informed Consent: I have reviewed the patients History and Physical, chart, labs and discussed the procedure including the risks, benefits and alternatives for the proposed anesthesia with the patient or authorized representative who has indicated his/her understanding and acceptance.     Dental advisory given  Plan Discussed with: CRNA and Anesthesiologist  Anesthesia Plan Comments:         Anesthesia Quick Evaluation

## 2020-12-23 NOTE — Op Note (Signed)
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 is a 60 y.o. male who presents with left leg chronic ulceration and osteomyelitis that is not curable.  The patient is scheduled for a left below-the-knee amputation.  I discussed in depth with the patient the risks, benefits, and alternatives to this procedure.  The patient is aware that the risk of this operation included but are not limited to:  bleeding, infection, myocardial infarction, stroke, death, failure to heal amputation wound, and possible need for more proximal amputation.  The patient is aware of the risks and agrees proceed forward with the procedure.  DESCRIPTION:  After full informed written consent was obtained from the patient, the patient was brought back to the operating room, and placed supine upon the operating table.  Prior to induction, the patient received IV antibiotics.  The patient was then prepped and draped in the standard fashion for a below-the-knee amputation.  After obtaining adequate anesthesia, the patient was prepped and draped in the standard fashion for a left below-the-knee amputation.  I marked out the anterior incision two finger breadths below the tibial tuberosity and then the marked out a posterior flap that was one third of the circumference of the calf in length.   I made the incisions for these flaps, and then dissected through the subcutaneous tissue, fascia, and muscle anteriorly.  I elevated  the periosteal tissue superiorly so that the tibia was about 3-4 cm shorter than the anterior skin flap.  I then transected the tibia with  a power saw and then took a wedge off the tibia anteriorly with the power saw.  Then I smoothed out the rough edges.  In a similar fashion, I cut back the fibula about two centimeters higher than the level of the tibia with a bone cutter.  I put a bone hook into the distal tibia and then used a large amputation knife to sharply develop a tissue plane through the muscle along the fibula.  In such fashion, the posterior flap was developed.  At this point, the specimen was passed off the field as the below-the-knee amputation.  At this point, I clamped all visibly bleeding arteries and veins using a combination of suture ligation with Silk suture and electrocautery.  Bleeding continued to be controlled with electrocautery and suture ligature.  The stump was washed off with sterile normal saline and no further active bleeding was noted.  I reapproximated the anterior and posterior fascia  with interrupted stitches of 0 Vicryl.  This was completed along the entire length of anterior and posterior fascia until there were no more loose space in the fascial line. I then placed a layer of 2-0 Vicryl sutures in the subcutaneous tissue. The skin was then  reapproximated with staples.  The stump was washed off and dried.  The incision was dressed with Xeroform and  then fluffs were applied.  Kerlix was wrapped around the leg and then gently an ACE wrap was applied.    COMPLICATIONS: none  CONDITION: stable   Adrian [name]  12/23/2020, 4:54 PM    This note was created with Dragon Medical transcription system. Any errors in dictation are purely unintentional.

## 2020-12-24 ENCOUNTER — Encounter: Payer: Self-pay | Admitting: Vascular Surgery

## 2020-12-24 DIAGNOSIS — L97429 Non-pressure chronic ulcer of left heel and midfoot with unspecified severity: Secondary | ICD-10-CM | POA: Diagnosis not present

## 2020-12-24 DIAGNOSIS — I10 Essential (primary) hypertension: Secondary | ICD-10-CM | POA: Diagnosis not present

## 2020-12-24 DIAGNOSIS — M86172 Other acute osteomyelitis, left ankle and foot: Secondary | ICD-10-CM | POA: Diagnosis not present

## 2020-12-24 DIAGNOSIS — E11621 Type 2 diabetes mellitus with foot ulcer: Secondary | ICD-10-CM | POA: Diagnosis not present

## 2020-12-24 LAB — CBC WITH DIFFERENTIAL/PLATELET
Abs Immature Granulocytes: 0.15 10*3/uL — ABNORMAL HIGH (ref 0.00–0.07)
Basophils Absolute: 0 10*3/uL (ref 0.0–0.1)
Basophils Relative: 0 %
Eosinophils Absolute: 0 10*3/uL (ref 0.0–0.5)
Eosinophils Relative: 0 %
HCT: 28.4 % — ABNORMAL LOW (ref 39.0–52.0)
Hemoglobin: 9.6 g/dL — ABNORMAL LOW (ref 13.0–17.0)
Immature Granulocytes: 1 %
Lymphocytes Relative: 14 %
Lymphs Abs: 2 10*3/uL (ref 0.7–4.0)
MCH: 30.4 pg (ref 26.0–34.0)
MCHC: 33.8 g/dL (ref 30.0–36.0)
MCV: 89.9 fL (ref 80.0–100.0)
Monocytes Absolute: 1.4 10*3/uL — ABNORMAL HIGH (ref 0.1–1.0)
Monocytes Relative: 10 %
Neutro Abs: 10.9 10*3/uL — ABNORMAL HIGH (ref 1.7–7.7)
Neutrophils Relative %: 75 %
Platelets: 290 10*3/uL (ref 150–400)
RBC: 3.16 MIL/uL — ABNORMAL LOW (ref 4.22–5.81)
RDW: 13 % (ref 11.5–15.5)
WBC: 14.4 10*3/uL — ABNORMAL HIGH (ref 4.0–10.5)
nRBC: 0 % (ref 0.0–0.2)

## 2020-12-24 LAB — BASIC METABOLIC PANEL
Anion gap: 10 (ref 5–15)
BUN: 32 mg/dL — ABNORMAL HIGH (ref 6–20)
CO2: 23 mmol/L (ref 22–32)
Calcium: 7.9 mg/dL — ABNORMAL LOW (ref 8.9–10.3)
Chloride: 104 mmol/L (ref 98–111)
Creatinine, Ser: 1.05 mg/dL (ref 0.61–1.24)
GFR, Estimated: >60 mL/min (ref >60)
Glucose, Bld: 117 mg/dL — ABNORMAL HIGH (ref 70–99)
Potassium: 4.3 mmol/L (ref 3.5–5.1)
Sodium: 137 mmol/L (ref 135–145)

## 2020-12-24 LAB — MAGNESIUM: Magnesium: 1.8 mg/dL (ref 1.7–2.4)

## 2020-12-24 LAB — GLUCOSE, CAPILLARY
Glucose-Capillary: 106 mg/dL — ABNORMAL HIGH (ref 70–99)
Glucose-Capillary: 109 mg/dL — ABNORMAL HIGH (ref 70–99)
Glucose-Capillary: 123 mg/dL — ABNORMAL HIGH (ref 70–99)
Glucose-Capillary: 128 mg/dL — ABNORMAL HIGH (ref 70–99)
Glucose-Capillary: 98 mg/dL (ref 70–99)

## 2020-12-24 MED ORDER — KETOROLAC TROMETHAMINE 30 MG/ML IJ SOLN
30.0000 mg | Freq: Four times a day (QID) | INTRAMUSCULAR | Status: AC
  Administered 2020-12-24 – 2020-12-25 (×4): 30 mg via INTRAVENOUS
  Filled 2020-12-24 (×4): qty 1

## 2020-12-24 MED ORDER — GABAPENTIN 100 MG PO CAPS
100.0000 mg | ORAL_CAPSULE | Freq: Three times a day (TID) | ORAL | Status: DC
  Administered 2020-12-24 – 2020-12-30 (×19): 100 mg via ORAL
  Filled 2020-12-24 (×19): qty 1

## 2020-12-24 MED ORDER — OXYCODONE-ACETAMINOPHEN 5-325 MG PO TABS
1.0000 | ORAL_TABLET | ORAL | Status: DC | PRN
  Administered 2020-12-28: 21:00:00 1 via ORAL
  Filled 2020-12-24: qty 1

## 2020-12-24 MED ORDER — MORPHINE SULFATE (PF) 2 MG/ML IV SOLN
2.0000 mg | INTRAVENOUS | Status: DC | PRN
  Administered 2020-12-24: 4 mg via INTRAVENOUS
  Filled 2020-12-24: qty 2

## 2020-12-24 MED ORDER — JUVEN PO PACK
1.0000 | PACK | Freq: Two times a day (BID) | ORAL | Status: DC
  Administered 2020-12-25 – 2020-12-30 (×11): 1 via ORAL

## 2020-12-24 NOTE — Progress Notes (Addendum)
[facility] Daily Progress Note  Subjective: 12/23/20: Left below-the-knee amputation  Patient tearful this AM.  Amputation seems to be a big life change for him - currently struggling accepting it. Pain seems to be under control.  No issues overnight.  Objective: Vitals:   12/23/20 2018 12/23/20 2344 12/24/20 0443 12/24/20 0734  BP: (!) 152/81 (!) 156/90 (!) 147/88 134/80  Pulse: (!) 110 (!) 105 99 98  Resp: 19 20 17 18   Temp: 98.2 F (36.8 C) 98.5 F (36.9 C) 99.3 F (37.4 C) 99.3 F (37.4 C)  TempSrc:  Oral Oral Oral  SpO2: 96% 96% 97% 95%  Weight:      Height:        Intake/Output Summary (Last 24 hours) at 12/24/2020 1019 Last data filed at 12/24/2020 0436 Gross per 24 hour  Intake 600 ml  Output 1300 ml  Net -700 ml   Physical Exam: A&Ox3, NAD CV: RRR Pulmonary: CTA Bilaterally Abdomen: Soft, Nontender, Nondistended Vascular:  Left lower extremity: Thigh soft.  Flexible at the knee joint.  Our dressing with some drainage which is to be expected.    Laboratory: CBC    Component Value Date/Time   WBC 14.4 (H) 12/24/2020 0555   HGB 9.6 (L) 12/24/2020 0555   HGB 9.6 (L) 04/26/2014 0309   HCT 28.4 (L) 12/24/2020 0555   HCT 29.0 (L) 04/26/2014 0309   PLT 290 12/24/2020 0555   PLT 331 04/26/2014 0309   BMET    Component Value Date/Time   NA 137 12/24/2020 0555   NA 143 04/26/2014 0309   K 4.3 12/24/2020 0555   K 4.1 04/26/2014 0309   CL 104 12/24/2020 0555   CL 106 04/26/2014 0309   CO2 23 12/24/2020 0555   CO2 27 04/26/2014 0309   GLUCOSE 117 (H) 12/24/2020 0555   GLUCOSE 100 (H) 04/26/2014 0309   BUN 32 (H) 12/24/2020 0555   BUN 11 04/26/2014 0309   CREATININE 1.05 12/24/2020 0555   CREATININE 1.08 04/26/2014 0309   CALCIUM 7.9 (L) 12/24/2020 0555   CALCIUM 8.5 04/26/2014 0309   GFRNONAA >60 12/24/2020 0555   GFRNONAA >60 04/26/2014 0309   GFRAA >60 09/19/2018 1156   GFRAA >60 04/26/2014 0309   Assessment/Planning: The  patient is a 60 year old male with left Charcot foot deformity with chronic osteomyelitis status post below the knee amputation POD #1  1) 3 gram drop in hemoglobin - patient seems to be asymptomatic at this time.  AM CBC. 2) White blood cell count this AM (14.4) which is expected due to the nature of an amputation.  AM CBC. 3) Written for oxycodone with breakthrough morphine.  Patient also on fentanyl and ambien. Will add 24 hours of Toradol (assess how will affect kidney function) and start Neurontin in preparation for nerve pain status post amputation and hopefully decrease need for narcotics. BMP in AM - monitor kidney function. 4) Okay from a vascular standpoint to start physical therapy. 5) Patient will need first dressing change on POD #3 which is Saturday. 6) Strongly recommend dispo to SNF.  Will consult social work. 7) Patient tearful this AM over recent amputation.  Will consult chaplain for assistance.  Discussed with Dr. Thursday Rodolfo Notaro PA-C 12/24/2020 10:19 AM

## 2020-12-24 NOTE — Progress Notes (Signed)
PROGRESS NOTE    Adrian [name].  GXQ:[id number] DOB: 06/18/1961 DOA: 12/17/2020 PCP: [facility]   Chief complaint.  Left foot ulcer. Brief Narrative:  The patient is a 60 yr old man who presented to [facility] ED on 12/17/2020 on the instruction of his podiatrist Dr. Ether Griffins due to 2 weeks of worsening erythema, swelling and pain associated with a chronic wound of the plantar aspect of the left midfoot. Patient has been seen by podiatry and vascular surgery, currently covered with vancomycin and Unasyn. MRI showed acute on chronic osteomyelitis. Scheduled for BKA on Wednesday.   Assessment & Plan:   Principal Problem:   Diabetic foot ulcer (HCC) Active Problems:   Asthma, chronic   Essential hypertension, benign   Bipolar I disorder (HCC)   Osteomyelitis of left foot (HCC)   Hyperlipidemia, unspecified   DM2 (diabetes mellitus, type 2) (HCC)  #1.  Left foot osteomyelitis. Status post BKA. Patient doing well post op.  Continue physical therapy/Occupational Therapy.  Patient may need SNF placement.  I will continue antibiotics for 1 more day, plan to DC tomorrow.  Patient may be able to discharge tomorrow.  #2 pure essential hypertension plan Continue current treatment plan  3.  Type 2 diabetes. Glucose appears to be well controlled.     DVT prophylaxis: Lovenox Code Status: Full Family Communication:  Disposition Plan:  .   Status is: Inpatient  Remains inpatient appropriate because:Inpatient level of care appropriate due to severity of illness   Dispo: The patient is from: Home              Anticipated d/c is to: Home              Patient currently is not medically stable to d/c.   Difficult to place patient No        I/O last 3 completed shifts: In: 1050 [I.V.:600; IV Piggyback:450] Out: 4700 [Urine:4300; Blood:400] No intake/output data recorded.     Consultants:   Vascular  Procedures: BKA  Antimicrobials: Vancomycin  and Unasyn.  Subjective: Patient doing well today, he slept well last night.  He does not have signal pain. Denies any short of breath or cough. No abdominal pain or nausea vomiting No dysuria hematuria.  Objective: Vitals:   12/23/20 2018 12/23/20 2344 12/24/20 0443 12/24/20 0734  BP: (!) 152/81 (!) 156/90 (!) 147/88 134/80  Pulse: (!) 110 (!) 105 99 98  Resp: 19 20 17 18   Temp: 98.2 F (36.8 C) 98.5 F (36.9 C) 99.3 F (37.4 C) 99.3 F (37.4 C)  TempSrc:  Oral Oral Oral  SpO2: 96% 96% 97% 95%  Weight:      Height:        Intake/Output Summary (Last 24 hours) at 12/24/2020 1020 Last data filed at 12/24/2020 0436 Gross per 24 hour  Intake 600 ml  Output 1300 ml  Net -700 ml   Filed Weights   12/19/20 0659 12/20/20 0545 12/23/20 0100  Weight: 131.6 kg 135.1 kg 132.8 kg    Examination:  General exam: Appears calm and comfortable  Respiratory system: Clear to auscultation. Respiratory effort normal. Cardiovascular system: S1 & S2 heard, RRR. No JVD, murmurs, rubs, gallops or clicks. No pedal edema. Gastrointestinal system: Abdomen is nondistended, soft and nontender. No organomegaly or masses felt. Normal bowel sounds heard. Central nervous system: Alert and oriented. No focal neurological deficits. Extremities: Left BKA. Skin: No rashes, lesions or ulcers Psychiatry: Judgement and insight appear normal. Mood &  affect appropriate.     Data Reviewed: I have personally reviewed following labs and imaging studies  CBC: Recent Labs  Lab 12/17/20 1249 12/18/20 0451 12/20/20 0500 12/23/20 0655 12/24/20 0555  WBC 8.4 8.3 8.5 8.3 14.4*  NEUTROABS 5.6 5.8 6.1  --  10.9*  HGB 12.4* 12.1* 11.8* 12.3* 9.6*  HCT 36.8* 35.1* 34.5* 37.0* 28.4*  MCV 90.4 90.2 90.3 91.1 89.9  PLT 264 240 245 258 290   Basic Metabolic Panel: Recent Labs  Lab 12/17/20 1249 12/18/20 0451 12/19/20 0659 12/20/20 0500 12/22/20 0535 12/23/20 0655 12/24/20 0555  NA 142 144  --  139  --   142 137  K 4.5 4.0  --  4.1  --  4.5 4.3  CL 107 110  --  105  --  108 104  CO2 24 25  --  25  --  26 23  GLUCOSE 101* 93  --  112*  --  105* 117*  BUN 17 16  --  17  --  32* 32*  CREATININE 1.03 0.90 0.83 0.86 0.94 0.97 1.05  CALCIUM 8.9 8.8*  --  8.7*  --  9.1 7.9*  MG 2.1 1.9  --   --   --   --  1.8   GFR: Estimated Creatinine Clearance: 105.3 mL/min (by C-G formula based on SCr of 1.05 mg/dL). Liver Function Tests: Recent Labs  Lab 12/17/20 1249 12/20/20 0500  AST 17 19  ALT 16 15  ALKPHOS 149* 127*  BILITOT 0.5 0.6  PROT 7.4 6.6  ALBUMIN 4.0 3.5   No results for input(s): LIPASE, AMYLASE in the last 168 hours. No results for input(s): AMMONIA in the last 168 hours. Coagulation Profile: Recent Labs  Lab 12/18/20 0451  INR 1.1   Cardiac Enzymes: No results for input(s): CKTOTAL, CKMB, CKMBINDEX, TROPONINI in the last 168 hours. BNP (last 3 results) No results for input(s): PROBNP in the last 8760 hours. HbA1C: No results for input(s): HGBA1C in the last 72 hours. CBG: Recent Labs  Lab 12/23/20 0811 12/23/20 1145 12/23/20 1629 12/24/20 0054 12/24/20 0820  GLUCAP 100* 82 118* 128* 106*   Lipid Profile: No results for input(s): CHOL, HDL, LDLCALC, TRIG, CHOLHDL, LDLDIRECT in the last 72 hours. Thyroid Function Tests: No results for input(s): TSH, T4TOTAL, FREET4, T3FREE, THYROIDAB in the last 72 hours. Anemia Panel: No results for input(s): VITAMINB12, FOLATE, FERRITIN, TIBC, IRON, RETICCTPCT in the last 72 hours. Sepsis Labs: Recent Labs  Lab 12/17/20 1249  LATICACIDVEN 1.1    Recent Results (from the past 240 hour(s))  Urine Culture     Status: Abnormal   Collection Time: 12/17/20  3:51 PM   Specimen: Urine, Random  Result Value Ref Range Status   Specimen Description   Final    URINE, RANDOM Performed at [facility], [address] [place], [place] [address]    Special Requests   Final    NONE Performed at [facility],  [address] [place], [place] [address]    Culture MULTIPLE SPECIES PRESENT, SUGGEST RECOLLECTION (A)  Final   Report Status 12/19/2020 FINAL  Final  SARS CORONAVIRUS 2 (TAT 6-24 HRS) Nasopharyngeal Nasopharyngeal Swab     Status: None   Collection Time: 12/17/20  3:55 PM   Specimen: Nasopharyngeal Swab  Result Value Ref Range Status   SARS Coronavirus 2 NEGATIVE NEGATIVE Final    Comment: (NOTE) SARS-CoV-2 target nucleic acids are NOT DETECTED.  The SARS-CoV-2 RNA is generally  detectable in upper and lower respiratory specimens during the acute phase of infection. Negative results do not preclude SARS-CoV-2 infection, do not rule out co-infections with other pathogens, and should not be used as the sole basis for treatment or other patient management decisions. Negative results must be combined with clinical observations, patient history, and epidemiological information. The expected result is Negative.  Fact Sheet for Patients: HairSlick.no  Fact Sheet for Healthcare Providers: quierodirigir.com  This test is not yet approved or cleared by the [place] FDA and  has been authorized for detection and/or diagnosis of SARS-CoV-2 by FDA under an Emergency Use Authorization (EUA). This EUA will remain  in effect (meaning this test can be used) for the duration of the COVID-19 declaration under Se ction 564(b)(1) of the Act, 21 U.S.C. section 360bbb-3(b)(1), unless the authorization is terminated or revoked sooner.  Performed at [facility], [address], [place], [place] [address]   Culture, blood (Routine X 2) w Reflex to ID Panel     Status: None   Collection Time: 12/17/20  7:17 PM   Specimen: BLOOD  Result Value Ref Range Status   Specimen Description BLOOD BLOOD LEFT FOREARM  Final   Special Requests   Final    BOTTLES DRAWN AEROBIC ONLY Blood Culture results may not be optimal due to an inadequate  volume of blood received in culture bottles   Culture   Final    NO GROWTH 5 DAYS Performed at [facility], [address], [place], [place] [address]    Report Status 12/22/2020 FINAL  Final  Culture, blood (Routine X 2) w Reflex to ID Panel     Status: None   Collection Time: 12/17/20  8:42 PM   Specimen: BLOOD  Result Value Ref Range Status   Specimen Description BLOOD BLOOD LEFT HAND  Final   Special Requests   Final    BOTTLES DRAWN AEROBIC AND ANAEROBIC Blood Culture adequate volume   Culture   Final    NO GROWTH 5 DAYS Performed at [facility], [address], [place], [place] [address]    Report Status 12/22/2020 FINAL  Final         Radiology Studies: No results found.      Scheduled Meds: . atorvastatin  40 mg Oral QHS  . enoxaparin (LOVENOX) injection  0.5 mg/kg Subcutaneous Q24H  . ferrous sulfate  325 mg Oral BID WC  . fluticasone furoate-vilanterol  1 puff Inhalation Daily  . insulin aspart  0-6 Units Subcutaneous TID WC  . levothyroxine  75 mcg Oral QAC breakfast  . montelukast  10 mg Oral Daily  . multivitamin with minerals  1 tablet Oral Daily  . oxcarbazepine  600 mg Oral BID  . pantoprazole  40 mg Oral Q1200  . Ensure Max Protein  11 oz Oral BID BM  . umeclidinium bromide  1 puff Inhalation Daily  . ziprasidone  40 mg Oral BID WC   Continuous Infusions: . sodium chloride 50 mL/hr at 12/24/20 0614  . ampicillin-sulbactam (UNASYN) IV 3 g (12/24/20 0514)  . vancomycin 1,750 mg (12/22/20 1623)     LOS: 7 days    Time spent: 28 minutes    Marrion Coy, MD [facility]   To contact the attending provider between 7A-7P or the covering provider during after hours 7P-7A, please log into the web site [email or web address] and access using universal [place] password for that web site. If you do not have  the password, please call the hospital operator.  12/24/2020, 10:20 AM

## 2020-12-24 NOTE — Progress Notes (Signed)
To whom it may concern:  Please be advised that the above named patient will require a short term nursing home stay - anticipated 30 days or less for rehabilitation and strengthening.  The plan is for return home.  

## 2020-12-24 NOTE — Progress Notes (Signed)
OT Cancellation Note  Patient Details Name: Adrian [name]. MRN: [id number] DOB: 05/26/1961   Cancelled Treatment:    Reason Eval/Treat Not Completed: Patient at procedure or test/ unavailable  OT consult received and chart reviewed. CNA currently in room providing patient care. Will f/u for OT evaluation at later date/time as able. Thank you.  Rejeana Brock, MS, OTR/L ascom [phone] 12/24/20, 10:29 AM

## 2020-12-24 NOTE — Progress Notes (Signed)
   12/24/20 1100  Clinical Encounter Type  Visited With Patient  Visit Type Initial;Spiritual support;Psychological support  Referral From Nurse  Consult/Referral To Chaplain  Spiritual Encounters  Spiritual Needs Prayer;Emotional  Chaplain Annagrace Carr responded to an OR in room 1C-112A Pt, Adrian [name]. Pt was sad and having a hard time adjusting mentally and physically to his amputation. Pt stated, "he was suppose to get up and sit in the chair by himself this morning but he whimpered out. He stated the nursed and Doctor was hard on him expecting him to be able to get up by himself." I shared with Pt that the medical team may seem tough right now but it is only because they are trying to help him recover and be able to yet be independent and self sufficient . I used reflective listening, words of encouragement and I ended the visit with prayer.

## 2020-12-24 NOTE — Evaluation (Signed)
Physical Therapy Evaluation Patient Details Name: Adrian [name]. MRN: [id number] DOB: 18-Dec-1960 Today's Date: 12/24/2020   History of Present Illness  60 yr old man here due to 2 weeks of worsening erythema, swelling and pain associated with a chronic wound of the plantar aspect of the left midfoot. 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  Clinical Impression  Pt with minimal pain at rest but had considerable discomfort/pain with any touch/palpation of residual limb.  Pt was able to do some AROM in L limb and showed good effort with exercises, however he had no AROM knee flexion and tolerated very little PROM, though he states that prior to surgery he would sit in w/c with knees bent normally (w/c bound x4 years).  Pt hesitant to do a lot t/o the session but did agree to attempt (with assist) a slide transfer to the recliner.  He was able to scoot at EOB and get part way onto the recliner (arm rest dropped) but then started to become too anxious, said "I can't do this today" and essentially threw himself back onto the bed. PT assisted with getting him back into appropriate position, including education on appropriate knee posture/pillow positioning for appropriate healing.   Patient with L below knee amputation which impairs his/her ability to perform daily activities like toileting, feeding, dressing, grooming, bathing in the home. A cane, walker, crutch will not resolve the patient's issue with performing activities of daily living. A lightweight wheelchair and cushion that is narrow enough to fit through the doors at his group home is required/recommended and will allow patient to safely perform daily activities.   Patient can safely propel the wheelchair in the home or has a caregiver who can provide assistance.      Follow Up Recommendations SNF    Equipment Recommendations   (pt will need a w/c that fits through his doorways (currently borrowing one that does, his is too wide for his group home))     Recommendations for Other Services       Precautions / Restrictions Precautions Precautions: Fall Restrictions Weight Bearing Restrictions: Yes Other Position/Activity Restrictions: s/p L BKA      Mobility  Bed Mobility Overal bed mobility: Needs Assistance Bed Mobility: Supine to Sit;Sit to Supine     Supine to sit: Mod assist Sit to supine: Min assist   General bed mobility comments: Pt initially hesitant to do a lot of mobility, but once he initiated movement he showed good effort and despite needing heavy HHA did better than expected getting to sitting EOB    Transfers Overall transfer level: Needs assistance Equipment used: None Transfers: Lateral/Scoot Transfers          Lateral/Scoot Transfers: Min assist General transfer comment: dropped arm rest of recliner and pulled close to bed.  He was able to do some lateral scooting at EOB and was able to get ~half way (R buttock) onto the recliner and then became too anxious stating "I can't do this, I'm too scared" Despite assist and plenty of encouragement/cuing he ultimately aborted the transfer and threw himself back onto the bed.  Ambulation/Gait                Stairs            Wheelchair Mobility    Modified Rankin (Stroke Patients Only)       Balance Overall balance assessment: Needs assistance Sitting-balance support: Bilateral upper extremity supported Sitting balance-Leahy Scale: Fair Sitting balance - Comments: Pt was anxious  in sitting, but once up and assisted to EOB was able to maintain balance relatively well with 1 or both hands       Standing balance comment: unable                             Pertinent Vitals/Pain Pain Assessment: 0-10 Pain Score: 4  Pain Location: minimal pain at rest, c/o significant with even minimal palpation anywhere on stump    Home Living Family/patient expects to be discharged to:: Skilled nursing facility Living Arrangements: Group  Home               Additional Comments: pt is borrowing a w/c that can fit through his door, he will need an appropriate one of his own    Prior Function Level of Independence: Needs assistance   Gait / Transfers Assistance Needed: Pt reports he has been essentially w/c bound for ~4 years, was able to transfer, stand at sink, etc prior to amputation           Hand Dominance        Extremity/Trunk Assessment   Upper Extremity Assessment Upper Extremity Assessment: Overall WFL for tasks assessed    Lower Extremity Assessment Lower Extremity Assessment: Generalized weakness (pt tolerated only ~5 degrees of flexion at L knee, has full extension.  AROM against light resist with all hip movement.)       Communication   Communication: No difficulties  Cognition Arousal/Alertness: Awake/alert Behavior During Therapy: Restless;Anxious                                          General Comments      Exercises Amputee Exercises Quad Sets: Strengthening;10 reps Hip ABduction/ADduction: AROM;Strengthening;10 reps Knee Flexion: PROM;5 reps (pt with very poor tolerance for knee flexion, no AROM and very minimal tolerance to PROM) Straight Leg Raises: AROM;10 reps;Strengthening   Assessment/Plan    PT Assessment Patient needs continued PT services  PT Problem List Decreased strength;Decreased range of motion;Decreased activity tolerance;Decreased balance;Decreased mobility;Decreased coordination;Decreased safety awareness;Decreased knowledge of precautions;Pain       PT Treatment Interventions DME instruction;Functional mobility training;Therapeutic activities;Therapeutic exercise;Balance training;Neuromuscular re-education;Patient/family education    PT Goals (Current goals can be found in the Care Plan section)  Acute Rehab PT Goals Patient Stated Goal: getting back to his group home, asking about prosthetic PT Goal Formulation: With patient Time For  Goal Achievement: 01/07/21 Potential to Achieve Goals: Fair    Frequency 7X/week   Barriers to discharge        Co-evaluation               AM-PAC PT "6 Clicks" Mobility  Outcome Measure Help needed turning from your back to your side while in a flat bed without using bedrails?: A Little Help needed moving from lying on your back to sitting on the side of a flat bed without using bedrails?: A Lot Help needed moving to and from a bed to a chair (including a wheelchair)?: A Lot Help needed standing up from a chair using your arms (e.g., wheelchair or bedside chair)?: Total Help needed to walk in hospital room?: Total Help needed climbing 3-5 steps with a railing? : Total 6 Click Score: 10    End of Session   Activity Tolerance: Patient limited by fatigue (anxious) Patient left: with bed  alarm set;with call bell/phone within reach   PT Visit Diagnosis: Muscle weakness (generalized) (M62.81);Difficulty in walking, not elsewhere classified (R26.2)    Time: 2542-7062 PT Time Calculation (min) (ACUTE ONLY): 25 min   Charges:   PT Evaluation $PT Eval Low Complexity: 1 Low PT Treatments $Therapeutic Exercise: 8-22 mins        Malachi Pro, DPT 12/24/2020, 11:09 AM

## 2020-12-24 NOTE — TOC Progression Note (Signed)
Transition of Care (TOC) - Progression Note    Patient Details  Name: Adrian [name]. MRN: [id number] Date of Birth: [birth year]  [facility]) CM/SW Contact  Allayne Butcher, RN Phone Number: 12/24/2020, 1:06 PM  Clinical Narrative:    Patient should be medically stable tomorrow for discharge.  Post op day 1 today from L BKA.  Patient will need SNF for rehab.  [facility] accepted in the [facility].  [facility] pending.    Expected Discharge Plan: Skilled Nursing Facility Barriers to Discharge: Continued Medical Work up  Expected Discharge Plan and Services Expected Discharge Plan: Skilled Nursing Facility   Discharge Planning Services: CM Consult Post Acute Care Choice: Skilled Nursing Facility Living arrangements for the past 2 months: Group Home                 DME Arranged: N/A DME Agency: NA                   Social Determinants of Health (SDOH) Interventions    Readmission Risk Interventions No flowsheet data found.

## 2020-12-24 NOTE — NC FL2 (Signed)
[place] MEDICAID FL2 LEVEL OF CARE SCREENING TOOL     IDENTIFICATION  Patient Name: Adrian [name]. Birthdate: [birth year] Sex: male Admission Date (Current Location): 12/17/2020  [place] and [place] Number:  Chiropodist and Address:  [facility], [address], [place], [place] [address]      Provider Number: [id number]  Attending Physician Name and Address:  Marrion Coy, MD  Relative Name and Phone Number:  Achilles Dunk (group home owner) [phone]    Current Level of Care: Hospital Recommended Level of Care: Skilled Nursing Facility Prior Approval Number:    Date Approved/Denied:   PASRR Number: [id number]  - good through 01/23/21  Discharge Plan: SNF    Current Diagnoses: Patient Active Problem List   Diagnosis Date Noted  . DM2 (diabetes mellitus, type 2) (HCC) 12/18/2020  . Diabetic foot ulcer (HCC) 12/17/2020  . Diabetic foot infection (HCC) 09/25/2017  . Foot pain 09/24/2017  . Osteomyelitis of left foot (HCC) 09/23/2017  . Left hand weakness 07/05/2016  . Numbness and tingling 07/05/2016  . Hypernatremia   . Candidal pneumonia (HCC)   . Abdominal wall cellulitis   . Bacteremia due to Streptococcus   . Dysphagia   . OSA (obstructive sleep apnea)   . Obesity hypoventilation syndrome (HCC)   . Bipolar I disorder (HCC)   . Other specified hypothyroidism   . Tracheostomy care (HCC)   . C. difficile colitis   . Acute respiratory failure with hypoxia (HCC)   . Encounter for imaging study to confirm nasogastric (NG) tube placement   . History of ETT   . Acute respiratory failure (HCC)   . ARDS (adult respiratory distress syndrome) (HCC)   . Acute renal failure (HCC)   . Respiratory failure (HCC) 10/04/2015  . Sepsis (HCC) 10/02/2015  . Acute kidney injury (HCC) 09/23/2014  . Allergic rhinitis, cause unspecified 02/24/2014  . Unspecified constipation 02/24/2014  . Essential hypertension, benign 02/24/2014  . UTI  (lower urinary tract infection) 01/16/2014  . Altered mental status 01/16/2014  . Diabetic osteomyelitis (HCC) 01/03/2014  . Diabetic neuropathy (HCC) 01/03/2014  . Obstructive sleep apnea 01/03/2014  . Asthma, chronic 01/03/2014  . Seborrheic dermatitis of scalp 01/03/2014  . Morbid obesity (HCC) 01/03/2014  . HTN (hypertension) 10/05/2013  . Hyperlipidemia, unspecified 10/05/2013  . Osteomyelitis (HCC) 10/05/2013    Orientation RESPIRATION BLADDER Height & Weight     Self,Time,Situation,Place  Normal External catheter,Incontinent Weight: 132.8 kg Height:  5\' 11"  (180.3 cm)  BEHAVIORAL SYMPTOMS/MOOD NEUROLOGICAL BOWEL NUTRITION STATUS      Incontinent Diet (carb modified 1600-2000 calories)  AMBULATORY STATUS COMMUNICATION OF NEEDS Skin   Extensive Assist Verbally Surgical wounds,PU Stage and Appropriate Care,Other (Comment) (LLE BKA incision- blister right arm, pressure inury buttocks stage 2)   PU Stage 2 Dressing: Daily                   Personal Care Assistance Level of Assistance  Bathing,Feeding,Dressing Bathing Assistance: Maximum assistance Feeding assistance: Limited assistance Dressing Assistance: Maximum assistance     Functional Limitations Info             SPECIAL CARE FACTORS FREQUENCY  PT (By licensed PT),OT (By licensed OT)     PT Frequency: 5 times per week OT Frequency: 5 times per week            Contractures Contractures Info: Not present    Additional Factors Info  Code Status,Allergies Code Status Info: Full Allergies  Info: NKA           Current Medications (12/24/2020):  This is the current hospital active medication list Current Facility-Administered Medications  Medication Dose Route Frequency Provider Last Rate Last Admin  . acetaminophen (TYLENOL) tablet 650 mg  650 mg Oral Q6H PRN Howerter, Justin B, DO   650 mg at 12/18/20 0203   Or  . acetaminophen (TYLENOL) suppository 650 mg  650 mg Rectal Q6H PRN Howerter, Justin B,  DO      . albuterol (VENTOLIN HFA) 108 (90 Base) MCG/ACT inhaler 2 puff  2 puff Inhalation Q6H PRN Howerter, Justin B, DO      . Ampicillin-Sulbactam (UNASYN) 3 g in sodium chloride 0.9 % 100 mL IVPB  3 g Intravenous Q6H Lowella Bandy, RPH 200 mL/hr at 12/24/20 1159 3 g at 12/24/20 1159  . atorvastatin (LIPITOR) tablet 40 mg  40 mg Oral QHS Howerter, Justin B, DO   40 mg at 12/23/20 2055  . enoxaparin (LOVENOX) injection 67.5 mg  0.5 mg/kg Subcutaneous Q24H Marrion Coy, MD   67.5 mg at 12/23/20 2056  . ferrous sulfate tablet 325 mg  325 mg Oral BID WC Howerter, Justin B, DO   325 mg at 12/24/20 0929  . fluticasone furoate-vilanterol (BREO ELLIPTA) 100-25 MCG/INH 1 puff  1 puff Inhalation Daily Howerter, Justin B, DO   1 puff at 12/24/20 0930  . gabapentin (NEURONTIN) capsule 100 mg  100 mg Oral TID Stegmayer, Kimberly A, PA-C   100 mg at 12/24/20 1157  . insulin aspart (novoLOG) injection 0-6 Units  0-6 Units Subcutaneous TID WC Howerter, Justin B, DO      . ketorolac (TORADOL) 30 MG/ML injection 30 mg  30 mg Intravenous Q6H Stegmayer, Kimberly A, PA-C   30 mg at 12/24/20 1157  . levothyroxine (SYNTHROID) tablet 75 mcg  75 mcg Oral QAC breakfast Howerter, Justin B, DO   75 mcg at 12/24/20 0513  . montelukast (SINGULAIR) tablet 10 mg  10 mg Oral Daily Howerter, Justin B, DO   10 mg at 12/24/20 0930  . morphine 2 MG/ML injection 2-4 mg  2-4 mg Intravenous Q3H PRN Schnier, Latina Craver, MD      . multivitamin with minerals tablet 1 tablet  1 tablet Oral Daily Swayze, Ava, DO   1 tablet at 12/24/20 0929  . Oxcarbazepine (TRILEPTAL) tablet 600 mg  600 mg Oral BID Howerter, Justin B, DO   600 mg at 12/24/20 0929  . oxyCODONE-acetaminophen (PERCOCET/ROXICET) 5-325 MG per tablet 1-2 tablet  1-2 tablet Oral Q4H PRN Schnier, Latina Craver, MD      . pantoprazole (PROTONIX) EC tablet 40 mg  40 mg Oral Q1200 Howerter, Justin B, DO   40 mg at 12/24/20 0930  . protein supplement (ENSURE MAX) liquid  11 oz Oral BID BM  Swayze, Ava, DO   11 oz at 12/24/20 1516  . umeclidinium bromide (INCRUSE ELLIPTA) 62.5 MCG/INH 1 puff  1 puff Inhalation Daily Rudene Christians B, RPH   1 puff at 12/24/20 0930  . vancomycin (VANCOREADY) IVPB 1750 mg/350 mL  1,750 mg Intravenous Q24H Albina Billet, RPH 175 mL/hr at 12/22/20 1623 1,750 mg at 12/22/20 1623  . ziprasidone (GEODON) capsule 40 mg  40 mg Oral BID WC Howerter, Justin B, DO   40 mg at 12/24/20 0929  . zolpidem (AMBIEN) tablet 5 mg  5 mg Oral QHS PRN Howerter, Justin B, DO   5 mg at 12/23/20 2055  Discharge Medications: Please see discharge summary for a list of discharge medications.  Relevant Imaging Results:  Relevant Lab Results:   Additional Information SS# [id number]  Allayne Butcher, RN

## 2020-12-24 NOTE — TOC Progression Note (Signed)
Transition of Care (TOC) - Progression Note    Patient Details  Name: Orpheus Hayhurst. MRN: [id number] Date of Birth: Mar 18, 1961  [facility]) CM/SW Contact  Allayne Butcher, RN Phone Number: 12/24/2020, 4:54 PM  Clinical Narrative:    Pasrr is back [id number], expires 01/23/21.   Expected Discharge Plan: Skilled Nursing Facility Barriers to Discharge: Continued Medical Work up  Expected Discharge Plan and Services Expected Discharge Plan: Skilled Nursing Facility   Discharge Planning Services: CM Consult Post Acute Care Choice: Skilled Nursing Facility Living arrangements for the past 2 months: Group Home                 DME Arranged: N/A DME Agency: NA                   Social Determinants of Health (SDOH) Interventions    Readmission Risk Interventions No flowsheet data found.

## 2020-12-24 NOTE — NC FL2 (Signed)
[place] MEDICAID FL2 LEVEL OF CARE SCREENING TOOL     IDENTIFICATION  Patient Name: Adrian [name]. Birthdate: [birth year] Sex: male Admission Date (Current Location): 12/17/2020  [place] and [place] Number:  Chiropodist and Address:  [facility], [address] [place], [place] [address]      Provider Number: [id number]  Attending Physician Name and Address:  Marrion Coy, MD  Relative Name and Phone Number:  Achilles Dunk (group home owner) [phone]    Current Level of Care: Hospital Recommended Level of Care: Skilled Nursing Facility Prior Approval Number:    Date Approved/Denied:   PASRR Number: pending  Discharge Plan: SNF    Current Diagnoses: Patient Active Problem List   Diagnosis Date Noted   DM2 (diabetes mellitus, type 2) (HCC) 12/18/2020   Diabetic foot ulcer (HCC) 12/17/2020   Diabetic foot infection (HCC) 09/25/2017   Foot pain 09/24/2017   Osteomyelitis of left foot (HCC) 09/23/2017   Left hand weakness 07/05/2016   Numbness and tingling 07/05/2016   Hypernatremia    Candidal pneumonia (HCC)    Abdominal wall cellulitis    Bacteremia due to Streptococcus    Dysphagia    OSA (obstructive sleep apnea)    Obesity hypoventilation syndrome (HCC)    Bipolar I disorder (HCC)    Other specified hypothyroidism    Tracheostomy care (HCC)    C. difficile colitis    Acute respiratory failure with hypoxia (HCC)    Encounter for imaging study to confirm nasogastric (NG) tube placement    History of ETT    Acute respiratory failure (HCC)    ARDS (adult respiratory distress syndrome) (HCC)    Acute renal failure (HCC)    Respiratory failure (HCC) 10/04/2015   Sepsis (HCC) 10/02/2015   Acute kidney injury (HCC) 09/23/2014   Allergic rhinitis, cause unspecified 02/24/2014   Unspecified constipation 02/24/2014   Essential hypertension, benign 02/24/2014   UTI (lower urinary tract  infection) 01/16/2014   Altered mental status 01/16/2014   Diabetic osteomyelitis (HCC) 01/03/2014   Diabetic neuropathy (HCC) 01/03/2014   Obstructive sleep apnea 01/03/2014   Asthma, chronic 01/03/2014   Seborrheic dermatitis of scalp 01/03/2014   Morbid obesity (HCC) 01/03/2014   HTN (hypertension) 10/05/2013   Hyperlipidemia, unspecified 10/05/2013   Osteomyelitis (HCC) 10/05/2013    Orientation RESPIRATION BLADDER Height & Weight     Self,Time,Situation,Place  Normal External catheter,Incontinent Weight: 132.8 kg Height:  5\' 11"  (180.3 cm)  BEHAVIORAL SYMPTOMS/MOOD NEUROLOGICAL BOWEL NUTRITION STATUS      Incontinent Diet (carb modified 1600-2000 calories)  AMBULATORY STATUS COMMUNICATION OF NEEDS Skin   Extensive Assist Verbally Surgical wounds,PU Stage and Appropriate Care,Other (Comment) (LLE BKA incision- blister right arm, pressure inury buttocks stage 2)   PU Stage 2 Dressing: Daily                   Personal Care Assistance Level of Assistance  Bathing,Feeding,Dressing Bathing Assistance: Maximum assistance Feeding assistance: Limited assistance Dressing Assistance: Maximum assistance     Functional Limitations Info             SPECIAL CARE FACTORS FREQUENCY  PT (By licensed PT),OT (By licensed OT)     PT Frequency: 5 times per week OT Frequency: 5 times per week            Contractures Contractures Info: Not present    Additional Factors Info  Code Status,Allergies Code Status Info: Full Allergies Info: NKA  Current Medications (12/24/2020):  This is the current hospital active medication list Current Facility-Administered Medications  Medication Dose Route Frequency Provider Last Rate Last Admin   acetaminophen (TYLENOL) tablet 650 mg  650 mg Oral Q6H PRN Howerter, Justin B, DO   650 mg at 12/18/20 0203   Or   acetaminophen (TYLENOL) suppository 650 mg  650 mg Rectal Q6H PRN Howerter, Justin B, DO       albuterol  (VENTOLIN HFA) 108 (90 Base) MCG/ACT inhaler 2 puff  2 puff Inhalation Q6H PRN Howerter, Justin B, DO       Ampicillin-Sulbactam (UNASYN) 3 g in sodium chloride 0.9 % 100 mL IVPB  3 g Intravenous Q6H Lowella Bandy, RPH 200 mL/hr at 12/24/20 0514 3 g at 12/24/20 0514   atorvastatin (LIPITOR) tablet 40 mg  40 mg Oral QHS Howerter, Justin B, DO   40 mg at 12/23/20 2055   enoxaparin (LOVENOX) injection 67.5 mg  0.5 mg/kg Subcutaneous Q24H Marrion Coy, MD   67.5 mg at 12/23/20 2056   ferrous sulfate tablet 325 mg  325 mg Oral BID WC Howerter, Justin B, DO   325 mg at 12/24/20 0929   fluticasone furoate-vilanterol (BREO ELLIPTA) 100-25 MCG/INH 1 puff  1 puff Inhalation Daily Howerter, Justin B, DO   1 puff at 12/24/20 0930   gabapentin (NEURONTIN) capsule 100 mg  100 mg Oral TID Stegmayer, Kimberly A, PA-C       insulin aspart (novoLOG) injection 0-6 Units  0-6 Units Subcutaneous TID WC Howerter, Justin B, DO       ketorolac (TORADOL) 30 MG/ML injection 30 mg  30 mg Intravenous Q6H Stegmayer, Kimberly A, PA-C       levothyroxine (SYNTHROID) tablet 75 mcg  75 mcg Oral QAC breakfast Howerter, Justin B, DO   75 mcg at 12/24/20 0513   montelukast (SINGULAIR) tablet 10 mg  10 mg Oral Daily Howerter, Justin B, DO   10 mg at 12/24/20 0930   morphine 2 MG/ML injection 2-4 mg  2-4 mg Intravenous Q3H PRN Schnier, Latina Craver, MD       multivitamin with minerals tablet 1 tablet  1 tablet Oral Daily Swayze, Ava, DO   1 tablet at 12/24/20 0929   Oxcarbazepine (TRILEPTAL) tablet 600 mg  600 mg Oral BID Howerter, Justin B, DO   600 mg at 12/24/20 8938   oxyCODONE-acetaminophen (PERCOCET/ROXICET) 5-325 MG per tablet 1-2 tablet  1-2 tablet Oral Q4H PRN Schnier, Latina Craver, MD       pantoprazole (PROTONIX) EC tablet 40 mg  40 mg Oral Q1200 Howerter, Justin B, DO   40 mg at 12/24/20 0930   protein supplement (ENSURE MAX) liquid  11 oz Oral BID BM Swayze, Ava, DO   11 oz at 12/24/20 0930   umeclidinium  bromide (INCRUSE ELLIPTA) 62.5 MCG/INH 1 puff  1 puff Inhalation Daily Rudene Christians B, RPH   1 puff at 12/24/20 0930   vancomycin (VANCOREADY) IVPB 1750 mg/350 mL  1,750 mg Intravenous Q24H Albina Billet, RPH 175 mL/hr at 12/22/20 1623 1,750 mg at 12/22/20 1623   ziprasidone (GEODON) capsule 40 mg  40 mg Oral BID WC Howerter, Justin B, DO   40 mg at 12/24/20 0929   zolpidem (AMBIEN) tablet 5 mg  5 mg Oral QHS PRN Howerter, Justin B, DO   5 mg at 12/23/20 2055     Discharge Medications: Please see discharge summary for a list of discharge medications.  Relevant Imaging Results:  Relevant Lab Results:   Additional Information SS# [id number]  Allayne Butcher, RN

## 2020-12-24 NOTE — Progress Notes (Signed)
Nutrition Follow-up  DOCUMENTATION CODES:   Morbid obesity  INTERVENTION:  Continue Ensure Max Protein po BID, each supplement provides 150 kcal and 30 grams of protein. Patient prefers strawberry.  Continue MVI po daily  Juven BID, each packet provides 95 calories, 2.5 grams of protein (collagen), and 9.8 grams of carbohydrate (3 grams sugar); also contains 7 grams of L-arginine and L-glutamine, 300 mg vitamin C, 15 mg vitamin E, 1.2 mcg vitamin B-12, 9.5 mg zinc, 200 mg calcium, and 1.5 g  Calcium Beta-hydroxy-Beta-methylbutyrate to support wound healing   NUTRITION DIAGNOSIS:   Increased nutrient needs related to wound healing as evidenced by estimated needs. -ongoing  GOAL:   Patient will meet greater than or equal to 90% of their needs -progressing  MONITOR:   PO intake,Supplement acceptance,Labs,Weight trends,I & O's,Skin  REASON FOR ASSESSMENT:   Malnutrition Screening Tool    ASSESSMENT:   60 year old male with PMHx of asthma, HTN, HLD, DM, hypothyroidism, bipolar disorder, sleep apnea, depression, Charcot foot, eczema admitted with infected left diabetic foot wound.  RD working remotely.  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 He has a good appetite and intake, eating 60-100% of the last 8 documented meals (89% average). He is drinking Ensure Max twice daily, accepted 11 of 11 offered this admission. Will continue supplement and will add Juven to support wound healing.   Admit wt 133.1 kg No new weight s/p left BKA  I/Os: -9942.3 ml since admit UOP: 900 ml x 24 hrs  Discharge planning to SNF, noted possible d/c on 3/11  Medications reviewed and include: Ferrous sulfate, Gabapentin, SSI, MVI, Trileptal, Protonix, Geodon, IV Unasyn, IV Vancomycin  Labs: CBGs 123,98,106,128, WBC 14.4 (H), Hgb 9.6 (L), HCT 28.4 (L)  Diet Order:   Diet Order            Diet Carb Modified Fluid consistency: Thin; Room service appropriate? Yes  Diet effective  now                 EDUCATION NEEDS:   No education needs have been identified at this time  Skin:  Skin Assessment: Skin Integrity Issues: Skin Integrity Issues:: Stage I,Stage II,Diabetic Ulcer Stage I: buttocks Stage II: coccyx Diabetic Ulcer: left foot  Last BM:  12/15/2020 per chart  Height:   Ht Readings from Last 1 Encounters:  12/17/20 5\' 11"  (1.803 m)    Weight:   Wt Readings from Last 1 Encounters:  12/23/20 132.8 kg    BMI:  Body mass index is 40.82 kg/m.  Estimated Nutritional Needs:   Kcal:  2400-2600  Protein:  120-130 grams  Fluid:  >/= 2 L/day   02/22/21, RD, LDN Clinical Nutrition After Hours/Weekend Pager # in Amion

## 2020-12-24 NOTE — Progress Notes (Addendum)
Patient resting in bed. On rounds, patient's dressing on left BKA stump is saturated with blood, along with small area on chuck pad placed under stump. Notified Dr. Gilda Crease via secure chat, however will page to determine what will take place. Bed in low position and call bell in reach.   Dr. Gilda Crease replied with apply several ABD pads and kerlix wraps to compress the bleed.

## 2020-12-25 DIAGNOSIS — Z4781 Encounter for orthopedic aftercare following surgical amputation: Secondary | ICD-10-CM

## 2020-12-25 DIAGNOSIS — I10 Essential (primary) hypertension: Secondary | ICD-10-CM | POA: Diagnosis not present

## 2020-12-25 DIAGNOSIS — D62 Acute posthemorrhagic anemia: Secondary | ICD-10-CM | POA: Diagnosis not present

## 2020-12-25 DIAGNOSIS — R710 Precipitous drop in hematocrit: Secondary | ICD-10-CM

## 2020-12-25 DIAGNOSIS — Z89512 Acquired absence of left leg below knee: Secondary | ICD-10-CM

## 2020-12-25 DIAGNOSIS — M86172 Other acute osteomyelitis, left ankle and foot: Secondary | ICD-10-CM | POA: Diagnosis not present

## 2020-12-25 LAB — CBC WITH DIFFERENTIAL/PLATELET
Abs Immature Granulocytes: 0.07 10*3/uL (ref 0.00–0.07)
Abs Immature Granulocytes: 0.12 10*3/uL — ABNORMAL HIGH (ref 0.00–0.07)
Basophils Absolute: 0 10*3/uL (ref 0.0–0.1)
Basophils Absolute: 0 10*3/uL (ref 0.0–0.1)
Basophils Relative: 0 %
Basophils Relative: 0 %
Eosinophils Absolute: 0.2 10*3/uL (ref 0.0–0.5)
Eosinophils Absolute: 0.1 10*3/uL (ref 0.0–0.5)
Eosinophils Relative: 2 %
Eosinophils Relative: 1 %
HCT: 22.2 % — ABNORMAL LOW (ref 39.0–52.0)
HCT: 21.2 % — ABNORMAL LOW (ref 39.0–52.0)
Hemoglobin: 7.4 g/dL — ABNORMAL LOW (ref 13.0–17.0)
Hemoglobin: 7.1 g/dL — ABNORMAL LOW (ref 13.0–17.0)
Immature Granulocytes: 1 %
Immature Granulocytes: 1 %
Lymphocytes Relative: 19 %
Lymphocytes Relative: 18 %
Lymphs Abs: 1.9 10*3/uL (ref 0.7–4.0)
Lymphs Abs: 2 10*3/uL (ref 0.7–4.0)
MCH: 30.2 pg (ref 26.0–34.0)
MCH: 30.1 pg (ref 26.0–34.0)
MCHC: 33.3 g/dL (ref 30.0–36.0)
MCHC: 33.5 g/dL (ref 30.0–36.0)
MCV: 90.6 fL (ref 80.0–100.0)
MCV: 89.8 fL (ref 80.0–100.0)
Monocytes Absolute: 1.3 10*3/uL — ABNORMAL HIGH (ref 0.1–1.0)
Monocytes Absolute: 1.4 10*3/uL — ABNORMAL HIGH (ref 0.1–1.0)
Monocytes Relative: 13 %
Monocytes Relative: 12 %
Neutro Abs: 6.2 10*3/uL (ref 1.7–7.7)
Neutro Abs: 7.4 10*3/uL (ref 1.7–7.7)
Neutrophils Relative %: 65 %
Neutrophils Relative %: 68 %
Platelets: 196 10*3/uL (ref 150–400)
Platelets: 188 10*3/uL (ref 150–400)
RBC: 2.45 MIL/uL — ABNORMAL LOW (ref 4.22–5.81)
RBC: 2.36 MIL/uL — ABNORMAL LOW (ref 4.22–5.81)
RDW: 13.5 % (ref 11.5–15.5)
RDW: 14.6 % (ref 11.5–15.5)
WBC: 9.6 10*3/uL (ref 4.0–10.5)
WBC: 10.9 10*3/uL — ABNORMAL HIGH (ref 4.0–10.5)
nRBC: 0 % (ref 0.0–0.2)
nRBC: 0.2 % (ref 0.0–0.2)

## 2020-12-25 LAB — BASIC METABOLIC PANEL
Anion gap: 10 (ref 5–15)
BUN: 38 mg/dL — ABNORMAL HIGH (ref 6–20)
CO2: 23 mmol/L (ref 22–32)
Calcium: 7.4 mg/dL — ABNORMAL LOW (ref 8.9–10.3)
Chloride: 102 mmol/L (ref 98–111)
Creatinine, Ser: 1.15 mg/dL (ref 0.61–1.24)
GFR, Estimated: >60 mL/min (ref >60)
Glucose, Bld: 114 mg/dL — ABNORMAL HIGH (ref 70–99)
Potassium: 3.9 mmol/L (ref 3.5–5.1)
Sodium: 135 mmol/L (ref 135–145)

## 2020-12-25 LAB — HEMOGLOBIN AND HEMATOCRIT, BLOOD
HCT: 21.7 % — ABNORMAL LOW (ref 39.0–52.0)
Hemoglobin: 7.6 g/dL — ABNORMAL LOW (ref 13.0–17.0)

## 2020-12-25 LAB — GLUCOSE, CAPILLARY
Glucose-Capillary: 113 mg/dL — ABNORMAL HIGH (ref 70–99)
Glucose-Capillary: 117 mg/dL — ABNORMAL HIGH (ref 70–99)
Glucose-Capillary: 99 mg/dL (ref 70–99)
Glucose-Capillary: 104 mg/dL — ABNORMAL HIGH (ref 70–99)

## 2020-12-25 LAB — IRON AND TIBC
Iron: 41 ug/dL — ABNORMAL LOW (ref 45–182)
Saturation Ratios: 24 % (ref 17.9–39.5)
TIBC: 174 ug/dL — ABNORMAL LOW (ref 250–450)
UIBC: 133 ug/dL

## 2020-12-25 LAB — HEMOGLOBIN: Hemoglobin: 7.1 g/dL — ABNORMAL LOW (ref 13.0–17.0)

## 2020-12-25 LAB — MAGNESIUM: Magnesium: 1.9 mg/dL (ref 1.7–2.4)

## 2020-12-25 LAB — PREPARE RBC (CROSSMATCH)

## 2020-12-25 MED ORDER — SODIUM CHLORIDE 0.9% IV SOLUTION: Freq: Once | INTRAVENOUS | Status: AC

## 2020-12-25 MED ORDER — TAMSULOSIN HCL 0.4 MG PO CAPS
0.4000 mg | ORAL_CAPSULE | Freq: Every day | ORAL | Status: DC
  Administered 2020-12-25 – 2020-12-29 (×5): 0.4 mg via ORAL
  Filled 2020-12-25 (×5): qty 1

## 2020-12-25 NOTE — Progress Notes (Signed)
Spoke with Dr. Chipper Herb, he asked what post residual void was. Bladder scan showed 230cc. He instructed to order flomax and to have scheduled voids every 4-6 hours.

## 2020-12-25 NOTE — TOC Progression Note (Signed)
Transition of Care (TOC) - Progression Note    Patient Details  Name: Corderro Koloski. MRN: [id number] Date of Birth: [birth year]  Transition [facility] Care [facility]) CM/SW Contact  Allayne Butcher, RN Phone Number: 12/25/2020, 2:26 PM  Clinical Narrative:    Patient is not medically ready for discharge to [facility] today.  [facility] does not accept admissions over the weekend so earliest would be Monday.  Patient updated on discharge status.  Voice message left for Doristine Mango from the group home to return call.     Expected Discharge Plan: Skilled Nursing Facility Barriers to Discharge: Continued Medical Work up  Expected Discharge Plan and Services Expected Discharge Plan: Skilled Nursing Facility   Discharge Planning Services: CM Consult Post Acute Care Choice: Skilled Nursing Facility Living arrangements for the past 2 months: Group Home                 DME Arranged: N/A DME Agency: NA                   Social Determinants of Health (SDOH) Interventions    Readmission Risk Interventions No flowsheet data found.

## 2020-12-25 NOTE — Progress Notes (Signed)
Patient resting in bed. Went in to assist with bath and bed change, assessed that his male purewick had blood in it. Removed it and assessed penis. Did not see any breaks or scratches but did notice blood coming out of urethra. Along with using male purewick, patient also can be incontinent. Bladder scanned patient and it showed >927cc of urine in bladder. Notified Dr. Chipper Herb about what happened. He ordered to insert catheter and obtain urine cultures. Obtained a regular 14 french catheter, attempted insertion and could not get catheter to advance, asked for assistance from charge nurse, Morrie Sheldon. However, still no success. Called and obtained another catheter, 14FR coude. Inserted catheter however, with assistance still no success, could not get catheter to advance. While pulling out catheter, a blood clot was removed. After patient voiced concern about needing to void. Assisted patient with urinal and he voided 500cc, with an episode of incontinence after urine collection. Notified Dr. Chipper Herb. Patient is resting in bed with call bell in reach.

## 2020-12-25 NOTE — Evaluation (Signed)
Occupational Therapy Evaluation Patient Details Name: Adrian [name]. MRN: [id number] DOB: 05/14/1961 Today's Date: 12/25/2020    History of Present Illness 60 yr old man here due to 2 weeks of worsening erythema, swelling and pain associated with a chronic wound of the plantar aspect of the left midfoot. 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   Clinical Impression   Pt seen for OT evaluation this date in setting of acute hospitalization d/t s/p L BKA. Pt with decreased fxl activity tolerance and mobility also inhibited by wound drainage. Pt requires CGA to come to sitting and MIN A for back to bed. Standing held at this time as OT conferred with RN re: dressing drainage. Pt requires MOD A for LB ADLs and SETUP for seated UB ADLs. Pt tolerates well. Anticipate pt will require STR follow up occupational therapy.     Follow Up Recommendations  SNF    Equipment Recommendations  3 in 1 bedside commode;Tub/shower seat;Wheelchair (measurements OT);Wheelchair cushion (measurements OT) (anticipate pt will require at least 22" wheelchair seat and cushion to accomodate his body habitus.)    Recommendations for Other Services       Precautions / Restrictions Precautions Precautions: Fall Restrictions Weight Bearing Restrictions: Yes Other Position/Activity Restrictions: s/p L BKA      Mobility Bed Mobility Overal bed mobility: Needs Assistance Bed Mobility: Supine to Sit;Sit to Supine     Supine to sit: Supervision;Min guard;HOB elevated Sit to supine: Min assist   General bed mobility comments: slight assist to manage LEs for back to bed    Transfers Overall transfer level: Needs assistance Equipment used: None Transfers: Lateral/Scoot Transfers          Lateral/Scoot Transfers: Min guard General transfer comment: pt with good tolerance for lateral scooting while seated EOB. OT with only CGA for balance/safety. Pt uses bed railing to assist. stand attempts deferred this date d/t amount of  sero-sanguinous wound discharge noted. RN notified and agrees with deferral of further mobility this date.    Balance Overall balance assessment: Needs assistance Sitting-balance support: Bilateral upper extremity supported Sitting balance-Leahy Scale: Good Sitting balance - Comments: R LE supported. Pt with G static sitting, limited ability to tolerate challenge so only F dynamic sitting.       Standing balance comment: unable                           ADL either performed or assessed with clinical judgement   ADL Overall ADL's : Needs assistance/impaired                                       General ADL Comments: SETUP for seated UB ADLs-primarily d/t h/o stroke impacting L hand. MOD A for LB ADLs.     Vision Baseline Vision/History: Wears glasses Wears Glasses: At all times Patient Visual Report: No change from baseline       Perception     Praxis      Pertinent Vitals/Pain Pain Assessment: Faces Faces Pain Scale: Hurts a little bit Pain Location: L LE with mobility Pain Descriptors / Indicators: Grimacing Pain Intervention(s): Limited activity within patient's tolerance;Monitored during session     Hand Dominance Right   Extremity/Trunk Assessment Upper Extremity Assessment Upper Extremity Assessment: RUE deficits/detail;LUE deficits/detail RUE Deficits / Details: ROM WFL, MMT grossly 4-/5 LUE Deficits / Details: Pt with h/o stroke impacting  FMC of digits of L hand. Shld and elbow ROM WFL. wrist with limited flex/ext and digits with limited extension (especially third digit). Pt able to grasp, but difficulty with opening small containers/packaging. LUE Sensation: decreased light touch LUE Coordination: decreased fine motor   Lower Extremity Assessment Lower Extremity Assessment: Defer to PT evaluation;Generalized weakness;LLE deficits/detail LLE Deficits / Details: BKA with decent amount of sero-sanguinous discharge noted and reported  to RN. hip flex/ext WFL.       Communication Communication Communication: No difficulties   Cognition Arousal/Alertness: Awake/alert Behavior During Therapy: WFL for tasks assessed/performed Overall Cognitive Status: Within Functional Limits for tasks assessed                                 General Comments: Pt is oriented and able to follow all commands, makes some off-color statements, but overall appropriate for tasks assessed.   General Comments       Exercises Other Exercises Other Exercises: OT facilitaets ed re: role of OT in acute setting-pt somewhat familiar. OT also ed re: positioning recommendations for post-op BKA to prevent connective tissue from healing in flexed position. Pt with good understanding.   Shoulder Instructions      Home Living Family/patient expects to be discharged to:: Skilled nursing facility Living Arrangements: Group Home                               Additional Comments: pt is borrowing a w/c that can fit through his door, he will need an appropriate one of his own      Prior Functioning/Environment Level of Independence: Needs assistance  Gait / Transfers Assistance Needed: Pt reports he has been essentially w/c bound for ~4 years, was able to transfer, stand at sink, etc prior to amputation (NWB to L LE d/t non-healing/prolonged diabetic foot ulcer) ADL's / Homemaking Assistance Needed: Pt recieved assistance from group home staff with IADLs including bathing, meals and meds, but as otherwise able to complete self care.   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        OT Problem List: Decreased strength;Decreased range of motion;Decreased activity tolerance;Decreased knowledge of use of DME or AE;Impaired sensation;Obesity;Pain;Increased edema      OT Treatment/Interventions: Self-care/ADL training;DME and/or AE instruction;Therapeutic  activities;Balance training;Therapeutic exercise;Energy conservation;Patient/family education;Manual therapy    OT Goals(Current goals can be found in the care plan section) Acute Rehab OT Goals Patient Stated Goal: wants to get back to his group home, but states " I need to be near-perfect with getting around by the time I go back there" OT Goal Formulation: With patient Time For Goal Achievement: 01/08/21 Potential to Achieve Goals: Fair ADL Goals Pt Will Perform Lower Body Dressing: with min guard assist;sitting/lateral leans (with AE PRN) Pt Will Transfer to Toilet: with mod assist;squat pivot transfer;bedside commode (drop arm if possible) Pt Will Perform Toileting - Clothing Manipulation and hygiene: with mod assist;sitting/lateral leans Pt/caregiver will Perform Home Exercise Program: Increased strength;Both right and left upper extremity;With Supervision  OT Frequency: Min 1X/week   Barriers to D/C: Inaccessible home environment  reports group home with narrow doorways hard to fit his w/c through       Co-evaluation              AM-PAC OT "  6 Clicks" Daily Activity     Outcome Measure Help from another person eating meals?: A Little Help from another person taking care of personal grooming?: A Little Help from another person toileting, which includes using toliet, bedpan, or urinal?: A Lot Help from another person bathing (including washing, rinsing, drying)?: A Lot Help from another person to put on and taking off regular upper body clothing?: A Little Help from another person to put on and taking off regular lower body clothing?: A Lot 6 Click Score: 15   End of Session Nurse Communication: Mobility status;Other (comment) (wound observations)  Activity Tolerance: Patient tolerated treatment well Patient left: in bed;with call bell/phone within reach;with bed alarm set  OT Visit Diagnosis: Other abnormalities of gait and mobility (R26.89);Muscle weakness (generalized)  (M62.81)                Time: 4742-5956 OT Time Calculation (min): 45 min Charges:  OT General Charges $OT Visit: 1 Visit OT Evaluation $OT Eval Moderate Complexity: 1 Mod OT Treatments $Self Care/Home Management : 23-37 mins $Therapeutic Activity: 8-22 mins  Rejeana Brock, MS, OTR/L ascom [phone] 12/30/20, 9:38 AM

## 2020-12-25 NOTE — Progress Notes (Signed)
Per conversation with Manuela Schwartz, NP, Lovenox 67.5 held for this evening due to bleeding concerns. -nothing follows

## 2020-12-25 NOTE — Progress Notes (Signed)
1 unit PRBC completed transfusion

## 2020-12-25 NOTE — Progress Notes (Signed)
PT Cancellation Note  Patient Details Name: Adrian [name]. MRN: [id number] DOB: 1961/03/21   Cancelled Treatment:    Reason Eval/Treat Not Completed: Medical issues which prohibited therapy (Per chart review, Hb continues to drop, now has orders to recieve blood. Will resume services at later date/time.)   Kimila Papaleo C 12/25/2020, 3:31 PM

## 2020-12-25 NOTE — Progress Notes (Signed)
Patient resting in bed. Dressing to new left BKA was saturated with blood and PA came to floor and changed dressing. Assisted at bedside. New dressing in place. She stated to notify her if dressing becomes saturated due to possibility of placing stitches. Bed in low position and call bell in reach.

## 2020-12-25 NOTE — Progress Notes (Addendum)
PROGRESS NOTE    Lieutenant Diego.  XVQ:[id number] DOB: 11/25/1960 DOA: 12/17/2020 PCP: [facility]    Brief Narrative:  The patient is a 60 yr old man who presented to [facility] ED on 12/17/2020 on the instruction of his podiatrist Dr. Ether Griffins due to 2 weeks of worsening erythema, swelling and pain associated with a chronic wound of the plantar aspect of the left midfoot. Patient has been seen by podiatry and vascular surgery, currently covered with vancomycin and Unasyn. MRI showed acute on chronic osteomyelitis.  BKA performed on 3/10.   Assessment & Plan:   Principal Problem:   Diabetic foot ulcer (HCC) Active Problems:   Asthma, chronic   Essential hypertension, benign   Bipolar I disorder (HCC)   Osteomyelitis of left foot (HCC)   Hyperlipidemia, unspecified   DM2 (diabetes mellitus, type 2) (HCC)  #1.  Left foot osteomyelitis. Status post BKA. Acute blood loss anemia. Postop day #2 after BKA.  Still has some bleeding from the stump, hemoglobin dropped down to 7.1 today, will transfuse 1 unit.  No significant iron deficiency. We will check a CBC again and transfuse if needed. Vascular surgery is aware that patient has some bleeding from the stump. Source of infection has been eliminated, will discontinue IV antibiotics.  #2. essential hypertension. Continue current treatment.  3.  Type 2 diabetes. Controlled  Addendum: 1420.  4.  Urinary retention with some hematuria. Patient had a residual over 900 mL, will anchor Foley catheter send UA.   DVT prophylaxis: Lovenix Code Status: Full Family Communication:  Disposition Plan:  .   Status is: Inpatient  Remains inpatient appropriate because:Inpatient level of care appropriate due to severity of illness   Dispo: The patient is from: Home              Anticipated d/c is to: SNF              Patient currently is not medically stable to d/c.   Difficult to place patient No        I/O  last 3 completed shifts: In: 120 [P.O.:120] Out: 2950 [Urine:2950] Total I/O In: 120 [P.O.:120] Out: 800 [Urine:800]     Consultants:   Vascular  Procedures: BKA  Antimicrobials:  None  Subjective: Patient has bleeding from the stump, vascular surgery aware.  Pressure applied. Denies any short of breath or cough. No abdominal pain or nausea vomiting. No fever chills  Objective: Vitals:   12/25/20 0531 12/25/20 0547 12/25/20 0741 12/25/20 1211  BP: (!) 103/54 109/64 (!) 145/83 136/72  Pulse: 86  89 90  Resp: 17  18 16   Temp: 98 F (36.7 C)  98.2 F (36.8 C) 98.4 F (36.9 C)  TempSrc: Oral     SpO2: 97%  98% 100%  Weight:      Height:        Intake/Output Summary (Last 24 hours) at 12/25/2020 1229 Last data filed at 12/25/2020 1228 Gross per 24 hour  Intake 120 ml  Output 2950 ml  Net -2830 ml   Filed Weights   12/20/20 0545 12/23/20 0100 12/25/20 0500  Weight: 135.1 kg 132.8 kg 135.6 kg    Examination:  General exam: Appears calm and comfortable  Respiratory system: Clear to auscultation. Respiratory effort normal. Cardiovascular system: S1 & S2 heard, RRR. No JVD, murmurs, rubs, gallops or clicks. No pedal edema. Gastrointestinal system: Abdomen is nondistended, soft and nontender. No organomegaly or masses felt. Normal bowel sounds heard. Central  nervous system: Alert and oriented. No focal neurological deficits. Extremities: Left BKA. Skin: No rashes, lesions or ulcers Psychiatry: Judgement and insight appear normal. Mood & affect appropriate.     Data Reviewed: I have personally reviewed following labs and imaging studies  CBC: Recent Labs  Lab 12/20/20 0500 12/23/20 0655 12/24/20 0555 12/25/20 0439 12/25/20 1026  WBC 8.5 8.3 14.4* 9.6  --   NEUTROABS 6.1  --  10.9* 6.2  --   HGB 11.8* 12.3* 9.6* 7.4* 7.1*  HCT 34.5* 37.0* 28.4* 22.2*  --   MCV 90.3 91.1 89.9 90.6  --   PLT 245 258 290 196  --    Basic Metabolic Panel: Recent Labs   Lab 12/20/20 0500 12/22/20 0535 12/23/20 0655 12/24/20 0555 12/25/20 0439  NA 139  --  142 137 135  K 4.1  --  4.5 4.3 3.9  CL 105  --  108 104 102  CO2 25  --  26 23 23   GLUCOSE 112*  --  105* 117* 114*  BUN 17  --  32* 32* 38*  CREATININE 0.86 0.94 0.97 1.05 1.15  CALCIUM 8.7*  --  9.1 7.9* 7.4*  MG  --   --   --  1.8 1.9   GFR: Estimated Creatinine Clearance: 97.2 mL/min (by C-G formula based on SCr of 1.15 mg/dL). Liver Function Tests: Recent Labs  Lab 12/20/20 0500  AST 19  ALT 15  ALKPHOS 127*  BILITOT 0.6  PROT 6.6  ALBUMIN 3.5   No results for input(s): LIPASE, AMYLASE in the last 168 hours. No results for input(s): AMMONIA in the last 168 hours. Coagulation Profile: No results for input(s): INR, PROTIME in the last 168 hours. Cardiac Enzymes: No results for input(s): CKTOTAL, CKMB, CKMBINDEX, TROPONINI in the last 168 hours. BNP (last 3 results) No results for input(s): PROBNP in the last 8760 hours. HbA1C: No results for input(s): HGBA1C in the last 72 hours. CBG: Recent Labs  Lab 12/24/20 1204 12/24/20 1631 12/24/20 2220 12/25/20 0741 12/25/20 1212  GLUCAP 98 123* 109* 113* 117*   Lipid Profile: No results for input(s): CHOL, HDL, LDLCALC, TRIG, CHOLHDL, LDLDIRECT in the last 72 hours. Thyroid Function Tests: No results for input(s): TSH, T4TOTAL, FREET4, T3FREE, THYROIDAB in the last 72 hours. Anemia Panel: Recent Labs    12/25/20 0439  TIBC 174*  IRON 41*   Sepsis Labs: No results for input(s): PROCALCITON, LATICACIDVEN in the last 168 hours.  Recent Results (from the past 240 hour(s))  Urine Culture     Status: Abnormal   Collection Time: 12/17/20  3:51 PM   Specimen: Urine, Random  Result Value Ref Range Status   Specimen Description   Final    URINE, RANDOM Performed at [facility], [address], [place], [place]    Special Requests   Final    NONE Performed at [facility], [address] [place], [place]    Culture MULTIPLE SPECIES PRESENT, SUGGEST RECOLLECTION (A)  Final   Report Status 12/19/2020 FINAL  Final  SARS CORONAVIRUS 2 (TAT 6-24 HRS) Nasopharyngeal Nasopharyngeal Swab     Status: None   Collection Time: 12/17/20  3:55 PM   Specimen: Nasopharyngeal Swab  Result Value Ref Range Status   SARS Coronavirus 2 NEGATIVE NEGATIVE Final    Comment: (NOTE) SARS-CoV-2 target nucleic acids are NOT DETECTED.  The SARS-CoV-2 RNA is generally detectable in upper and lower respiratory specimens during the acute  phase of infection. Negative results do not preclude SARS-CoV-2 infection, do not rule out co-infections with other pathogens, and should not be used as the sole basis for treatment or other patient management decisions. Negative results must be combined with clinical observations, patient history, and epidemiological information. The expected result is Negative.  Fact Sheet for Patients: HairSlick.no  Fact Sheet for Healthcare Providers: quierodirigir.com  This test is not yet approved or cleared by the [place] FDA and  has been authorized for detection and/or diagnosis of SARS-CoV-2 by FDA under an Emergency Use Authorization (EUA). This EUA will remain  in effect (meaning this test can be used) for the duration of the COVID-19 declaration under Se ction 564(b)(1) of the Act, 21 U.S.C. section 360bbb-3(b)(1), unless the authorization is terminated or revoked sooner.  Performed at [facility], [address] [place], [place] [address]   Culture, blood (Routine X 2) w Reflex to ID Panel     Status: None   Collection Time: 12/17/20  7:17 PM   Specimen: BLOOD  Result Value Ref Range Status   Specimen Description BLOOD BLOOD LEFT FOREARM  Final   Special Requests   Final    BOTTLES DRAWN AEROBIC ONLY Blood Culture results may not be optimal due to an inadequate volume of blood  received in culture bottles   Culture   Final    NO GROWTH 5 DAYS Performed at [facility], [address], [place], [place] [address]    Report Status 12/22/2020 FINAL  Final  Culture, blood (Routine X 2) w Reflex to ID Panel     Status: None   Collection Time: 12/17/20  8:42 PM   Specimen: BLOOD  Result Value Ref Range Status   Specimen Description BLOOD BLOOD LEFT HAND  Final   Special Requests   Final    BOTTLES DRAWN AEROBIC AND ANAEROBIC Blood Culture adequate volume   Culture   Final    NO GROWTH 5 DAYS Performed at [facility], [address] [place], [place] [address]    Report Status 12/22/2020 FINAL  Final         Radiology Studies: No results found.      Scheduled Meds: . sodium chloride   Intravenous Once  . atorvastatin  40 mg Oral QHS  . enoxaparin (LOVENOX) injection  0.5 mg/kg Subcutaneous Q24H  . ferrous sulfate  325 mg Oral BID WC  . fluticasone furoate-vilanterol  1 puff Inhalation Daily  . gabapentin  100 mg Oral TID  . insulin aspart  0-6 Units Subcutaneous TID WC  . levothyroxine  75 mcg Oral QAC breakfast  . montelukast  10 mg Oral Daily  . multivitamin with minerals  1 tablet Oral Daily  . nutrition supplement (JUVEN)  1 packet Oral BID BM  . oxcarbazepine  600 mg Oral BID  . pantoprazole  40 mg Oral Q1200  . Ensure Max Protein  11 oz Oral BID BM  . umeclidinium bromide  1 puff Inhalation Daily  . ziprasidone  40 mg Oral BID WC   Continuous Infusions: . ampicillin-sulbactam (UNASYN) IV 3 g (12/25/20 0529)  . vancomycin 1,750 mg (12/24/20 1721)     LOS: 8 days    Time spent: 27 minutes    Marrion Coy, MD [facility]   To contact the attending provider between 7A-7P or the covering provider during after hours 7P-7A, please log into the web site [email or web address] and access using universal [place] password for  that web site. If you do not have the password, please call the hospital  operator.  12/25/2020, 12:29 PM

## 2020-12-25 NOTE — Care Management Important Message (Signed)
Important Message  Patient Details  Name: Adrian [name]. MRN: [id number] Date of Birth: 1961-02-25   Medicare Important Message Given:  Yes     Olegario Messier A Allmond 12/25/2020, 10:40 AM

## 2020-12-25 NOTE — Progress Notes (Signed)
 Vein & Vascular Surgery Daily Progress Note   Subjective: 12/23/20: Left below-the-knee amputation  Patient seems to be in better spirits this AM.  Pain seems to be controlled. No issues overnight with the exception of losing of the OR dressing.  Objective: Vitals:   12/25/20 0531 12/25/20 0547 12/25/20 0741 12/25/20 1211  BP: (!) 103/54 109/64 (!) 145/83 136/72  Pulse: 86  89 90  Resp: 17  18 16   Temp: 98 F (36.7 C)  98.2 F (36.8 C) 98.4 F (36.9 C)  TempSrc: Oral     SpO2: 97%  98% 100%  Weight:      Height:        Intake/Output Summary (Last 24 hours) at 12/25/2020 1234 Last data filed at 12/25/2020 1228 Gross per 24 hour  Intake 120 ml  Output 2950 ml  Net -2830 ml   Physical Exam: A&Ox3, NAD CV: RRR Pulmonary: CTA Bilaterally Abdomen: Soft, Non-tender, Non-distended Vascular:  Left Lower Extremity: Thigh soft. Flexible at the knee joint. OR dressing removed.  Large amounts of clotted blood noted in dressing.  Incision: Staples are intact.  Minimal oozing from two staple sites.  New dressing placed.   Laboratory: CBC    Component Value Date/Time   WBC 9.6 12/25/2020 0439   HGB 7.1 (L) 12/25/2020 1026   HGB 9.6 (L) 04/26/2014 0309   HCT 22.2 (L) 12/25/2020 0439   HCT 29.0 (L) 04/26/2014 0309   PLT 196 12/25/2020 0439   PLT 331 04/26/2014 0309   BMET    Component Value Date/Time   NA 135 12/25/2020 0439   NA 143 04/26/2014 0309   K 3.9 12/25/2020 0439   K 4.1 04/26/2014 0309   CL 102 12/25/2020 0439   CL 106 04/26/2014 0309   CO2 23 12/25/2020 0439   CO2 27 04/26/2014 0309   GLUCOSE 114 (H) 12/25/2020 0439   GLUCOSE 100 (H) 04/26/2014 0309   BUN 38 (H) 12/25/2020 0439   BUN 11 04/26/2014 0309   CREATININE 1.15 12/25/2020 0439   CREATININE 1.08 04/26/2014 0309   CALCIUM 7.4 (L) 12/25/2020 0439   CALCIUM 8.5 04/26/2014 0309   GFRNONAA >60 12/25/2020 0439   GFRNONAA >60 04/26/2014 0309   GFRAA >60 09/19/2018 1156   GFRAA >60 04/26/2014  0309   Assessment/Planning: The patient is a 60 year old male with left Charcot foot deformity with chronic osteomyelitis status post below the knee amputation POD #2  1) Normally, would not change the OR dressing POD #3 however with the need for constant reinforcement of the dressing as well as a 5gram  drop in hemoglobin over 2 days remove the dressing to assess the stump. 2) Large amount of clotted blood in the dressing.  The patient stump was gently cleaned.  Two areas of small oozing. Elevate the stump.  If the patient continues to bleed from these two areas may need a suture or two. 3) Patient with a drop in hemoglobin, 7.1 today.  Will plan on transfusion.  Post-transfusion CBC. 4) hopefully can participate in physical therapy tomorrow  Discussed with Dr. [facility] PA-C 12/25/2020 12:34 PM

## 2020-12-26 DIAGNOSIS — M86172 Other acute osteomyelitis, left ankle and foot: Secondary | ICD-10-CM | POA: Diagnosis not present

## 2020-12-26 DIAGNOSIS — R338 Other retention of urine: Secondary | ICD-10-CM

## 2020-12-26 DIAGNOSIS — E11621 Type 2 diabetes mellitus with foot ulcer: Secondary | ICD-10-CM | POA: Diagnosis not present

## 2020-12-26 DIAGNOSIS — D62 Acute posthemorrhagic anemia: Secondary | ICD-10-CM | POA: Diagnosis not present

## 2020-12-26 LAB — CBC WITH DIFFERENTIAL/PLATELET
Abs Immature Granulocytes: 0.11 10*3/uL — ABNORMAL HIGH (ref 0.00–0.07)
Basophils Absolute: 0 10*3/uL (ref 0.0–0.1)
Basophils Relative: 0 %
Eosinophils Absolute: 0.1 10*3/uL (ref 0.0–0.5)
Eosinophils Relative: 1 %
HCT: 21.6 % — ABNORMAL LOW (ref 39.0–52.0)
Hemoglobin: 7.1 g/dL — ABNORMAL LOW (ref 13.0–17.0)
Immature Granulocytes: 1 %
Lymphocytes Relative: 15 %
Lymphs Abs: 1.7 10*3/uL (ref 0.7–4.0)
MCH: 29.5 pg (ref 26.0–34.0)
MCHC: 32.9 g/dL (ref 30.0–36.0)
MCV: 89.6 fL (ref 80.0–100.0)
Monocytes Absolute: 1.3 10*3/uL — ABNORMAL HIGH (ref 0.1–1.0)
Monocytes Relative: 12 %
Neutro Abs: 8.1 10*3/uL — ABNORMAL HIGH (ref 1.7–7.7)
Neutrophils Relative %: 71 %
Platelets: 182 10*3/uL (ref 150–400)
RBC: 2.41 MIL/uL — ABNORMAL LOW (ref 4.22–5.81)
RDW: 14.5 % (ref 11.5–15.5)
WBC: 11.4 10*3/uL — ABNORMAL HIGH (ref 4.0–10.5)
nRBC: 0 % (ref 0.0–0.2)

## 2020-12-26 LAB — URINALYSIS, COMPLETE (UACMP) WITH MICROSCOPIC
Bilirubin Urine: NEGATIVE
Glucose, UA: NEGATIVE mg/dL
Ketones, ur: NEGATIVE mg/dL
Leukocytes,Ua: NEGATIVE
Nitrite: NEGATIVE
Protein, ur: NEGATIVE mg/dL
Specific Gravity, Urine: 1.012 (ref 1.005–1.030)
pH: 5 (ref 5.0–8.0)

## 2020-12-26 LAB — TYPE AND SCREEN
ABO/RH(D): B POS
Antibody Screen: NEGATIVE
Unit division: 0

## 2020-12-26 LAB — GLUCOSE, CAPILLARY
Glucose-Capillary: 110 mg/dL — ABNORMAL HIGH (ref 70–99)
Glucose-Capillary: 113 mg/dL — ABNORMAL HIGH (ref 70–99)
Glucose-Capillary: 141 mg/dL — ABNORMAL HIGH (ref 70–99)
Glucose-Capillary: 113 mg/dL — ABNORMAL HIGH (ref 70–99)

## 2020-12-26 LAB — PREPARE RBC (CROSSMATCH)

## 2020-12-26 LAB — BPAM RBC
Blood Product Expiration Date: 202204042359
ISSUE DATE / TIME: 202203111506
Unit Type and Rh: 7300

## 2020-12-26 LAB — OCCULT BLOOD X 1 CARD TO LAB, STOOL: Fecal Occult Bld: NEGATIVE

## 2020-12-26 MED ORDER — FUROSEMIDE 10 MG/ML IJ SOLN
20.0000 mg | Freq: Once | INTRAMUSCULAR | Status: AC
  Administered 2020-12-26: 15:00:00 20 mg via INTRAVENOUS
  Filled 2020-12-26: qty 2

## 2020-12-26 MED ORDER — PANTOPRAZOLE SODIUM 40 MG PO TBEC
80.0000 mg | DELAYED_RELEASE_TABLET | Freq: Two times a day (BID) | ORAL | Status: DC
  Administered 2020-12-26 – 2020-12-30 (×9): 80 mg via ORAL
  Filled 2020-12-26 (×9): qty 2

## 2020-12-26 MED ORDER — SODIUM CHLORIDE 0.9% IV SOLUTION: Freq: Once | INTRAVENOUS | Status: AC

## 2020-12-26 NOTE — Progress Notes (Signed)
PROGRESS NOTE    Lieutenant Diego.  XKG:[id number] DOB: Jul 04, 1961 DOA: 12/17/2020 PCP: [facility]    Brief Narrative:  The patient is a 60 yr old man who presented to [facility] ED on 12/17/2020 on the instruction of his podiatrist Dr. Ether Griffins due to 2 weeks of worsening erythema, swelling and pain associated with a chronic wound of the plantar aspect of the left midfoot. Patient has been seen by podiatry and vascular surgery, currently covered with vancomycin and Unasyn. MRI showed acute on chronic osteomyelitis.  BKA performed on 3/10.   Assessment & Plan:   Principal Problem:   Diabetic foot ulcer (HCC) Active Problems:   Asthma, chronic   Essential hypertension, benign   Bipolar I disorder (HCC)   Osteomyelitis of left foot (HCC)   Hyperlipidemia, unspecified   DM2 (diabetes mellitus, type 2) (HCC)   Acute blood loss anemia  #1.  Left foot osteomyelitis. Status post BKA. Acute blood loss anemia. Antibiotics completed. Patient still has some bleeding from the stump, hemoglobin dropped to get back again to 7.1 today, vascular surgery is managing the wound. I will give another unit of PRBC transfusion today, 20 mg of Lasix afterwards.  Check a CBC again tomorrow.  2.  Essential hypertension. Continue current treatment.  3.  Urinary retention. UA does not have any evidence of UTI.  Most likely due to benign prostate hypertrophy, patient has difficulty with urination due to unable to stand up.  Patient was able to urinate eventually without a Foley catheter, Flomax was started.  #4.  Type 2 diabetes. Continue current regimen.  #5.  Morbid obesity. Patient has BMI of 41.69    DVT prophylaxis: Lovenix Code Status: Full Family Communication:  Disposition Plan:  .   Status is: Inpatient  Remains inpatient appropriate because:Inpatient level of care appropriate due to severity of illness   Dispo: The patient is from: Home               Anticipated d/c is to: SNF              Patient currently is not medically stable to d/c.   Difficult to place patient No        I/O last 3 completed shifts: In: 1400 [P.O.:1200; I.V.:200] Out: 4425 [Urine:4425] No intake/output data recorded.     Consultants:   vascular  Procedures: BKA  Antimicrobials: None  Subjective: Patient continued to have small amount of bleeding from the stump, seem to be improving.  Hemoglobin dropped. Patient developed a urinary retention yesterday, but eventually was able to urinate after failed Foley catheter placement.  Flomax started. Patient denies any short of breath or cough. No fever chills. He has no abdominal pain or nausea vomiting.    Objective: Vitals:   12/25/20 2038 12/26/20 0049 12/26/20 0546 12/26/20 0809  BP: 124/60 114/62 125/66 (!) 151/73  Pulse: 94 96 90 93  Resp: 16 16 15 15   Temp: 98.7 F (37.1 C) 98.1 F (36.7 C) 98.3 F (36.8 C) 98.3 F (36.8 C)  TempSrc: Oral Oral Oral Oral  SpO2: 96% 95% 95% 98%  Weight:      Height:        Intake/Output Summary (Last 24 hours) at 12/26/2020 0859 Last data filed at 12/26/2020 0245 Gross per 24 hour  Intake 1400 ml  Output 3225 ml  Net -1825 ml   Filed Weights   12/20/20 0545 12/23/20 0100 12/25/20 0500  Weight: 135.1 kg 132.8 kg 135.6  kg    Examination:  General exam: Appears calm and comfortable, morbid obesity. Respiratory system: Clear to auscultation. Respiratory effort normal. Cardiovascular system: S1 & S2 heard, RRR. No JVD, murmurs, rubs, gallops or clicks. No pedal edema. Gastrointestinal system: Abdomen is nondistended, soft and nontender. No organomegaly or masses felt. Normal bowel sounds heard. Central nervous system: Alert and oriented. No focal neurological deficits. Extremities: Left BKA Skin: No rashes, lesions or ulcers Psychiatry: Judgement and insight appear normal. Mood & affect appropriate.     Data Reviewed: I have personally  reviewed following labs and imaging studies  CBC: Recent Labs  Lab 12/20/20 0500 12/23/20 0655 12/24/20 0555 12/25/20 0439 12/25/20 1026 12/25/20 2022 12/25/20 2313 12/26/20 0447  WBC 8.5 8.3 14.4* 9.6  --   --  10.9* 11.4*  NEUTROABS 6.1  --  10.9* 6.2  --   --  7.4 8.1*  HGB 11.8* 12.3* 9.6* 7.4* 7.1* 7.6* 7.1* 7.1*  HCT 34.5* 37.0* 28.4* 22.2*  --  21.7* 21.2* 21.6*  MCV 90.3 91.1 89.9 90.6  --   --  89.8 89.6  PLT 245 258 290 196  --   --  188 182   Basic Metabolic Panel: Recent Labs  Lab 12/20/20 0500 12/22/20 0535 12/23/20 0655 12/24/20 0555 12/25/20 0439  NA 139  --  142 137 135  K 4.1  --  4.5 4.3 3.9  CL 105  --  108 104 102  CO2 25  --  26 23 23   GLUCOSE 112*  --  105* 117* 114*  BUN 17  --  32* 32* 38*  CREATININE 0.86 0.94 0.97 1.05 1.15  CALCIUM 8.7*  --  9.1 7.9* 7.4*  MG  --   --   --  1.8 1.9   GFR: Estimated Creatinine Clearance: 97.2 mL/min (by C-G formula based on SCr of 1.15 mg/dL). Liver Function Tests: Recent Labs  Lab 12/20/20 0500  AST 19  ALT 15  ALKPHOS 127*  BILITOT 0.6  PROT 6.6  ALBUMIN 3.5   No results for input(s): LIPASE, AMYLASE in the last 168 hours. No results for input(s): AMMONIA in the last 168 hours. Coagulation Profile: No results for input(s): INR, PROTIME in the last 168 hours. Cardiac Enzymes: No results for input(s): CKTOTAL, CKMB, CKMBINDEX, TROPONINI in the last 168 hours. BNP (last 3 results) No results for input(s): PROBNP in the last 8760 hours. HbA1C: No results for input(s): HGBA1C in the last 72 hours. CBG: Recent Labs  Lab 12/25/20 0741 12/25/20 1212 12/25/20 1545 12/25/20 2039 12/26/20 0738  GLUCAP 113* 117* 99 104* 110*   Lipid Profile: No results for input(s): CHOL, HDL, LDLCALC, TRIG, CHOLHDL, LDLDIRECT in the last 72 hours. Thyroid Function Tests: No results for input(s): TSH, T4TOTAL, FREET4, T3FREE, THYROIDAB in the last 72 hours. Anemia Panel: Recent Labs    12/25/20 0439  TIBC  174*  IRON 41*   Sepsis Labs: No results for input(s): PROCALCITON, LATICACIDVEN in the last 168 hours.  Recent Results (from the past 240 hour(s))  Urine Culture     Status: Abnormal   Collection Time: 12/17/20  3:51 PM   Specimen: Urine, Random  Result Value Ref Range Status   Specimen Description   Final    URINE, RANDOM Performed at [facility], [address], [place], [place]    Special Requests   Final    NONE Performed at [facility], [address] [place], [place]  Culture MULTIPLE SPECIES PRESENT, SUGGEST RECOLLECTION (A)  Final   Report Status 12/19/2020 FINAL  Final  SARS CORONAVIRUS 2 (TAT 6-24 HRS) Nasopharyngeal Nasopharyngeal Swab     Status: None   Collection Time: 12/17/20  3:55 PM   Specimen: Nasopharyngeal Swab  Result Value Ref Range Status   SARS Coronavirus 2 NEGATIVE NEGATIVE Final    Comment: (NOTE) SARS-CoV-2 target nucleic acids are NOT DETECTED.  The SARS-CoV-2 RNA is generally detectable in upper and lower respiratory specimens during the acute phase of infection. Negative results do not preclude SARS-CoV-2 infection, do not rule out co-infections with other pathogens, and should not be used as the sole basis for treatment or other patient management decisions. Negative results must be combined with clinical observations, patient history, and epidemiological information. The expected result is Negative.  Fact Sheet for Patients: HairSlick.nohttps://www.fda.gov/media/138098/download  Fact Sheet for Healthcare Providers: quierodirigir.comhttps://www.fda.gov/media/138095/download  This test is not yet approved or cleared by the [place] FDA and  has been authorized for detection and/or diagnosis of SARS-CoV-2 by FDA under an Emergency Use Authorization (EUA). This EUA will remain  in effect (meaning this test can be used) for the duration of the COVID-19 declaration under Se ction 564(b)(1) of the Act, 21 U.S.C. section  360bbb-3(b)(1), unless the authorization is terminated or revoked sooner.  Performed at [facility], [address] [place], [place] [address]   Culture, blood (Routine X 2) w Reflex to ID Panel     Status: None   Collection Time: 12/17/20  7:17 PM   Specimen: BLOOD  Result Value Ref Range Status   Specimen Description BLOOD BLOOD LEFT FOREARM  Final   Special Requests   Final    BOTTLES DRAWN AEROBIC ONLY Blood Culture results may not be optimal due to an inadequate volume of blood received in culture bottles   Culture   Final    NO GROWTH 5 DAYS Performed at [facility], [address] [place], [place] [address]    Report Status 12/22/2020 FINAL  Final  Culture, blood (Routine X 2) w Reflex to ID Panel     Status: None   Collection Time: 12/17/20  8:42 PM   Specimen: BLOOD  Result Value Ref Range Status   Specimen Description BLOOD BLOOD LEFT HAND  Final   Special Requests   Final    BOTTLES DRAWN AEROBIC AND ANAEROBIC Blood Culture adequate volume   Culture   Final    NO GROWTH 5 DAYS Performed at [facility], [address] [place], [place] [address]    Report Status 12/22/2020 FINAL  Final         Radiology Studies: No results found.      Scheduled Meds: . sodium chloride   Intravenous Once  . atorvastatin  40 mg Oral QHS  . enoxaparin (LOVENOX) injection  0.5 mg/kg Subcutaneous Q24H  . ferrous sulfate  325 mg Oral BID WC  . fluticasone furoate-vilanterol  1 puff Inhalation Daily  . furosemide  20 mg Intravenous Once  . gabapentin  100 mg Oral TID  . insulin aspart  0-6 Units Subcutaneous TID WC  . levothyroxine  75 mcg Oral QAC breakfast  . montelukast  10 mg Oral Daily  . multivitamin with minerals  1 tablet Oral Daily  . nutrition supplement (JUVEN)  1 packet Oral BID BM  . oxcarbazepine  600 mg Oral BID  . pantoprazole  40 mg Oral Q1200  . Ensure Max Protein  11 oz Oral BID BM  . tamsulosin  0.4 mg Oral QPC supper  .  umeclidinium bromide  1 puff Inhalation Daily  . ziprasidone  40 mg Oral BID WC   Continuous Infusions:   LOS: 9 days    Time spent: 28 minutes    Marrion Coy, MD [facility]   To contact the attending provider between 7A-7P or the covering provider during after hours 7P-7A, please log into the web site [email or web address] and access using universal [place] password for that web site. If you do not have the password, please call the hospital operator.  12/26/2020, 8:59 AM

## 2020-12-26 NOTE — Progress Notes (Signed)
Physical Therapy Treatment Patient Details Name: Adrian [name]. MRN: [id number] DOB: 1961-07-23 Today's Date: 12/26/2020    History of Present Illness 60 yr old man here due to 2 weeks of worsening erythema, swelling and pain associated with a chronic wound of the plantar aspect of the left midfoot. 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    PT Comments    Pt in bed resting upon entry, agreeable to limited session. Chart review shows Hb: 7.1 despite 1 unit PRBC previous evening. Pt has more blood ordered today. Pt has no overt anemic symptoms this date with bed-level exercises. He does express concern for operative limb exercises contributing to additional bleeding, but focus in heavily on contralateral limb and BUE. Pt does well with exercises, noted weakness with manually resisted Rt hip/knee extension, does not seem strong enough to perform NWB transfers, albeit he reports to not ever being success with LLE NWB transfers over the past 4 years. Pt left in bed [facility] @ requested, all needs met. No signs of active bleeding on current wound dressing.    Follow Up Recommendations  SNF     Equipment Recommendations  Wheelchair (measurements PT);Wheelchair cushion (measurements PT);Other (comment) (elevated leg rests, anti tippers)    Recommendations for Other Services       Precautions / Restrictions Precautions Precautions: Fall Restrictions Other Position/Activity Restrictions: s/p L BKA    Mobility  Bed Mobility               General bed mobility comments: deferred, Hb @ 7.1 today, awaiting transfusion    Transfers                    Ambulation/Gait                 Stairs             Wheelchair Mobility    Modified Rankin (Stroke Patients Only)       Balance                                            Cognition Arousal/Alertness: Awake/alert Behavior During Therapy: WFL for tasks assessed/performed Overall Cognitive Status: Within  Functional Limits for tasks assessed                                        Exercises Other Exercises Other Exercises: RLE: 1x15 heel slides, resisted extension, hip ABDCT/add, SLR, SAQ Other Exercises: LLE: ABDCT/ADD 1x15, SLR 1x12 Other Exercises: Single UE overhead reach x5 bilat, cross body reach x5 bilat; isometric shoulder extension into mattress with scapular retraction 10x5secH    General Comments        Pertinent Vitals/Pain Pain Assessment: 0-10 Pain Score: 3  Pain Location: L LE with mobility Pain Intervention(s): Monitored during session    Home Living                      Prior Function            PT Goals (current goals can now be found in the care plan section) Acute Rehab PT Goals Patient Stated Goal: wants to get back to his group home, but states " I need to be near-perfect with getting around by the time I go back there" PT  Goal Formulation: With patient Time For Goal Achievement: 01/07/21 Potential to Achieve Goals: Fair Progress towards PT goals: Progressing toward goals    Frequency    7X/week      PT Plan Current plan remains appropriate    Co-evaluation              AM-PAC PT "6 Clicks" Mobility   Outcome Measure  Help needed turning from your back to your side while in a flat bed without using bedrails?: A Little Help needed moving from lying on your back to sitting on the side of a flat bed without using bedrails?: A Lot Help needed moving to and from a bed to a chair (including a wheelchair)?: A Lot Help needed standing up from a chair using your arms (e.g., wheelchair or bedside chair)?: Total Help needed to walk in hospital room?: Total Help needed climbing 3-5 steps with a railing? : Total 6 Click Score: 10    End of Session   Activity Tolerance: Patient tolerated treatment well;No increased pain Patient left: with bed alarm set;with call bell/phone within reach;in bed Nurse Communication:  Mobility status PT Visit Diagnosis: Muscle weakness (generalized) (M62.81);Difficulty in walking, not elsewhere classified (R26.2)     Time: 6015-6153 PT Time Calculation (min) (ACUTE ONLY): 18 min  Charges:  $Therapeutic Exercise: 8-22 mins                     12:27 PM, 12/26/20 Etta Grandchild, PT, DPT Physical Therapist - [facility]  [phone] ([place])    Dalessandro Baldyga C 12/26/2020, 12:23 PM

## 2020-12-26 NOTE — Progress Notes (Addendum)
[facility]  Daily Progress Note   Assessment/Planning:   POD #3 s/p L BKA, ?UGIB   Bleeding from L BKA has stopped  On appearance there appears to be some hematoma within the stump with some blistering  I would not re-explore unless forced to do such  Pt is asx currently from his anemia, but also not ambulatory  Would transfuse 1 u pRBC to facilitate participation with PT/OT  Pt have dark stools while getting cleaned just prior to bandage change  Guaic stools  Start PPI in case  UGIB  Dr. Lorretta Harp will recheck on Monday   Subjective  - 3 Days Post-Op   Pain ok, overall "not feeling great"   Objective   Vitals:   12/25/20 2038 12/26/20 0049 12/26/20 0546 12/26/20 0809  BP: 124/60 114/62 125/66 (!) 151/73  Pulse: 94 96 90 93  Resp: 16 16 15 15   Temp: 98.7 F (37.1 C) 98.1 F (36.7 C) 98.3 F (36.8 C) 98.3 F (36.8 C)  TempSrc: Oral Oral Oral Oral  SpO2: 96% 95% 95% 98%  Weight:      Height:         Intake/Output Summary (Last 24 hours) at 12/26/2020 0925 Last data filed at 12/26/2020 0900 Gross per 24 hour  Intake 1400 ml  Output 3875 ml  Net -2475 ml    VASC L BKA: no active bleeding, venous appearing clot on bandages, staple line intact, BKA stump somewhat edematous with some blistering, overall appears viable  NEURO Good motor strength in L BKA    Laboratory   CBC CBC Latest Ref Rng & Units 12/26/2020 12/25/2020 12/25/2020  WBC 4.0 - 10.5 K/uL 11.4(H) 10.9(H) -  Hemoglobin 13.0 - 17.0 g/dL 7.1(L) 7.1(L) 7.6(L)  Hematocrit 39.0 - 52.0 % 21.6(L) 21.2(L) 21.7(L)  Platelets 150 - 400 K/uL 182 188 -    BMET    Component Value Date/Time   NA 135 12/25/2020 0439   NA 143 04/26/2014 0309   K 3.9 12/25/2020 0439   K 4.1 04/26/2014 0309   CL 102 12/25/2020 0439   CL 106 04/26/2014 0309   CO2 23 12/25/2020 0439   CO2 27 04/26/2014 0309   GLUCOSE 114 (H) 12/25/2020 0439   GLUCOSE 100 (H) 04/26/2014 0309   BUN 38 (H)  12/25/2020 0439   BUN 11 04/26/2014 0309   CREATININE 1.15 12/25/2020 0439   CREATININE 1.08 04/26/2014 0309   CALCIUM 7.4 (L) 12/25/2020 0439   CALCIUM 8.5 04/26/2014 0309   GFRNONAA >60 12/25/2020 0439   GFRNONAA >60 04/26/2014 0309   GFRAA >60 09/19/2018 1156   GFRAA >60 04/26/2014 0309     06/27/2014, MD, FACS, FSVS Covering for [facility]: [phone]

## 2020-12-27 DIAGNOSIS — D62 Acute posthemorrhagic anemia: Secondary | ICD-10-CM | POA: Diagnosis not present

## 2020-12-27 DIAGNOSIS — R338 Other retention of urine: Secondary | ICD-10-CM | POA: Diagnosis not present

## 2020-12-27 DIAGNOSIS — E11621 Type 2 diabetes mellitus with foot ulcer: Secondary | ICD-10-CM | POA: Diagnosis not present

## 2020-12-27 DIAGNOSIS — M86172 Other acute osteomyelitis, left ankle and foot: Secondary | ICD-10-CM | POA: Diagnosis not present

## 2020-12-27 LAB — TYPE AND SCREEN
ABO/RH(D): B POS
Antibody Screen: NEGATIVE
Unit division: 0

## 2020-12-27 LAB — CBC WITH DIFFERENTIAL/PLATELET
Abs Immature Granulocytes: 0.19 10*3/uL — ABNORMAL HIGH (ref 0.00–0.07)
Basophils Absolute: 0 10*3/uL (ref 0.0–0.1)
Basophils Relative: 0 %
Eosinophils Absolute: 0.1 10*3/uL (ref 0.0–0.5)
Eosinophils Relative: 1 %
HCT: 26.5 % — ABNORMAL LOW (ref 39.0–52.0)
Hemoglobin: 8.7 g/dL — ABNORMAL LOW (ref 13.0–17.0)
Immature Granulocytes: 2 %
Lymphocytes Relative: 18 %
Lymphs Abs: 2.3 10*3/uL (ref 0.7–4.0)
MCH: 29.7 pg (ref 26.0–34.0)
MCHC: 32.8 g/dL (ref 30.0–36.0)
MCV: 90.4 fL (ref 80.0–100.0)
Monocytes Absolute: 1.5 10*3/uL — ABNORMAL HIGH (ref 0.1–1.0)
Monocytes Relative: 12 %
Neutro Abs: 8.7 10*3/uL — ABNORMAL HIGH (ref 1.7–7.7)
Neutrophils Relative %: 67 %
Platelets: 207 10*3/uL (ref 150–400)
RBC: 2.93 MIL/uL — ABNORMAL LOW (ref 4.22–5.81)
RDW: 15.1 % (ref 11.5–15.5)
WBC: 12.9 10*3/uL — ABNORMAL HIGH (ref 4.0–10.5)
nRBC: 0 % (ref 0.0–0.2)

## 2020-12-27 LAB — BASIC METABOLIC PANEL
Anion gap: 10 (ref 5–15)
BUN: 31 mg/dL — ABNORMAL HIGH (ref 6–20)
CO2: 24 mmol/L (ref 22–32)
Calcium: 8.3 mg/dL — ABNORMAL LOW (ref 8.9–10.3)
Chloride: 104 mmol/L (ref 98–111)
Creatinine, Ser: 0.98 mg/dL (ref 0.61–1.24)
GFR, Estimated: >60 mL/min (ref >60)
Glucose, Bld: 107 mg/dL — ABNORMAL HIGH (ref 70–99)
Potassium: 3.9 mmol/L (ref 3.5–5.1)
Sodium: 138 mmol/L (ref 135–145)

## 2020-12-27 LAB — PROCALCITONIN: Procalcitonin: <0.1 ng/mL

## 2020-12-27 LAB — BPAM RBC
Blood Product Expiration Date: 202204052359
ISSUE DATE / TIME: 202203121500
Unit Type and Rh: 7300

## 2020-12-27 LAB — IRON AND TIBC
Iron: 24 ug/dL — ABNORMAL LOW (ref 45–182)
Saturation Ratios: 13 % — ABNORMAL LOW (ref 17.9–39.5)
TIBC: 181 ug/dL — ABNORMAL LOW (ref 250–450)
UIBC: 157 ug/dL

## 2020-12-27 LAB — GLUCOSE, CAPILLARY
Glucose-Capillary: 109 mg/dL — ABNORMAL HIGH (ref 70–99)
Glucose-Capillary: 128 mg/dL — ABNORMAL HIGH (ref 70–99)
Glucose-Capillary: 118 mg/dL — ABNORMAL HIGH (ref 70–99)
Glucose-Capillary: 114 mg/dL — ABNORMAL HIGH (ref 70–99)

## 2020-12-27 LAB — OCCULT BLOOD X 1 CARD TO LAB, STOOL: Fecal Occult Bld: NEGATIVE

## 2020-12-27 LAB — VITAMIN B12: Vitamin B-12: 272 pg/mL (ref 180–914)

## 2020-12-27 LAB — URINE CULTURE: Culture: NO GROWTH

## 2020-12-27 LAB — MAGNESIUM: Magnesium: 2.2 mg/dL (ref 1.7–2.4)

## 2020-12-27 MED ORDER — SODIUM CHLORIDE 0.9 % IV SOLN
300.0000 mg | Freq: Once | INTRAVENOUS | Status: AC
  Administered 2020-12-27: 300 mg via INTRAVENOUS
  Filled 2020-12-27: qty 15

## 2020-12-27 NOTE — Progress Notes (Signed)
Physical Therapy Treatment Patient Details Name: Adrian [name]. MRN: [id number] DOB: Dec 26, 1960 Today's Date: 12/27/2020    History of Present Illness 60 yr old man here due to 2 weeks of worsening erythema, swelling and pain associated with a chronic wound of the plantar aspect of the left midfoot. 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    PT Comments    Pt ready ready for session. Participated in exercises as described below.  To EOB with min guard for about 3 minutes.  No LOB but reports dizziness 5-6/10.  Self initiated return to supine stating he was becoming "nervous" sitting.    Pt positioned with LLE in significant ext rotation and flexion at hip,  Stretching and encouragement to keep LE in a neutral position.  Voiced understanding stating he has been told that by several other therapists while here.  Pt does seem to self limit at times and education can be hard as pt talks over attempts to educate.     Follow Up Recommendations  SNF     Equipment Recommendations  Wheelchair (measurements PT);Wheelchair cushion (measurements PT);Other (comment)    Recommendations for Other Services       Precautions / Restrictions Precautions Precautions: Fall Restrictions Other Position/Activity Restrictions: s/p L BKA    Mobility  Bed Mobility Overal bed mobility: Needs Assistance Bed Mobility: Supine to Sit;Sit to Supine     Supine to sit: Supervision;Min guard;HOB elevated Sit to supine: Min guard;HOB elevated        Transfers                 General transfer comment: limited by dizziness  Ambulation/Gait                 Stairs             Wheelchair Mobility    Modified Rankin (Stroke Patients Only)       Balance Overall balance assessment: Needs assistance Sitting-balance support: Bilateral upper extremity supported Sitting balance-Leahy Scale: Good                                      Cognition Arousal/Alertness: Awake/alert Behavior  During Therapy: WFL for tasks assessed/performed Overall Cognitive Status: Within Functional Limits for tasks assessed                                        Exercises Other Exercises Other Exercises: BLE ex x 10 within  RLE amp limitations    General Comments        Pertinent Vitals/Pain Pain Assessment: Faces Faces Pain Scale: Hurts little more Pain Location: L LE with mobility Pain Descriptors / Indicators: Grimacing Pain Intervention(s): Monitored during session    Home Living                      Prior Function            PT Goals (current goals can now be found in the care plan section) Progress towards PT goals: Progressing toward goals    Frequency    7X/week      PT Plan Current plan remains appropriate    Co-evaluation              AM-PAC PT "6 Clicks" Mobility   Outcome Measure  Help needed turning  from your back to your side while in a flat bed without using bedrails?: A Little Help needed moving from lying on your back to sitting on the side of a flat bed without using bedrails?: A Lot Help needed moving to and from a bed to a chair (including a wheelchair)?: A Lot Help needed standing up from a chair using your arms (e.g., wheelchair or bedside chair)?: Total Help needed to walk in hospital room?: Total Help needed climbing 3-5 steps with a railing? : Total 6 Click Score: 10    End of Session   Activity Tolerance: Patient tolerated treatment well Patient left: with bed alarm set;with call bell/phone within reach;in bed Nurse Communication: Mobility status PT Visit Diagnosis: Muscle weakness (generalized) (M62.81);Difficulty in walking, not elsewhere classified (R26.2)     Time: 1206-1220 PT Time Calculation (min) (ACUTE ONLY): 14 min  Charges:  $Therapeutic Exercise: 8-22 mins                    Danielle Dess, PTA 12/27/20, 12:38 PM

## 2020-12-27 NOTE — Progress Notes (Addendum)
PROGRESS NOTE    Lieutenant Diego.  MCN:[id number] DOB: 03-12-1961 DOA: 12/17/2020 PCP: [facility]   Chief complaint.  Left foot ulcer. Brief Narrative:  The patient is a 60 yr old man who presented to [facility] ED on 12/17/2020 on the instruction of his podiatrist Dr. Ether Griffins due to 2 weeks of worsening erythema, swelling and pain associated with a chronic wound of the plantar aspect of the left midfoot. Patient has been seen by podiatry and vascular surgery, currently covered with vancomycin and Unasyn. MRI showed acute on chronic osteomyelitis. BKA performedon3/10.   Assessment & Plan:   Principal Problem:   Diabetic foot ulcer (HCC) Active Problems:   Asthma, chronic   Morbid obesity (HCC)   Essential hypertension, benign   OSA (obstructive sleep apnea)   Obesity hypoventilation syndrome (HCC)   Bipolar I disorder (HCC)   Osteomyelitis of left foot (HCC)   Hyperlipidemia, unspecified   DM2 (diabetes mellitus, type 2) (HCC)   Acute blood loss anemia   Acute urinary retention  #1.  New fever. Patient spiked a fever of 100.8 earlier this morning. Patient currently does not have any respiratory symptoms, UA negative.  No diarrhea.  This is most likely secondary to left leg stump hematoma.  Patient is followed by vascular surgery. Hold off antibiotics, check a procalcitonin level.  #2.  Left foot osteomyelitis, status post BKA. Acute blood loss anemia. Stump hematoma with bleeding. Condition more stable today.  Hemoglobin stable after initial transfusion.  Check B12 iron level. Patient is followed by vascular surgery. Antibiotics has been discontinued.  #3. urinary retention. Condition has resolved, continue Flomax.  4.  Type 2 diabetes.  Continue current regimen.  Addendum: Procal less than 0.1, no infection. Give IV iron for iron deficiency and active bleeding.   DVT prophylaxis: Lovenox Code Status: Full Family Communication:  Disposition  Plan:  .   Status is: Inpatient  Remains inpatient appropriate because:Inpatient level of care appropriate due to severity of illness   Dispo: The patient is from: Home              Anticipated d/c is to: SNF              Patient currently is not medically stable to d/c.   Difficult to place patient No        I/O last 3 completed shifts: In: 3389.1 [P.O.:1917; I.V.:2.7; Blood:284.5; Other:1185] Out: 4425 [Urine:4425] No intake/output data recorded.     Consultants:   Vascular surgery  Procedures: BKA  Antimicrobials: None  Subjective: Patient had a fever last night with temperature 100.8. Patient does not have any short of breath or cough. No diarrhea or abdominal pain. No dysuria or hematuria.  No longer has any urinary retention pain   Objective: Vitals:   12/26/20 2112 12/27/20 0102 12/27/20 0538 12/27/20 0816  BP: (!) 116/58 122/61 140/80 (!) 146/76  Pulse: 100 80 83 91  Resp: 16 18 17 18   Temp: (!) 100.8 F (38.2 C) 98.4 F (36.9 C) 98.7 F (37.1 C) 98.5 F (36.9 C)  TempSrc: Oral Oral Oral   SpO2: 95% 99% 100% 97%  Weight:      Height:        Intake/Output Summary (Last 24 hours) at 12/27/2020 0832 Last data filed at 12/27/2020 0648 Gross per 24 hour  Intake 2669.14 ml  Output 2700 ml  Net -30.86 ml   Filed Weights   12/20/20 0545 12/23/20 0100 12/25/20 0500  Weight:  135.1 kg 132.8 kg 135.6 kg    Examination:  General exam: Appears calm and comfortable  Respiratory system: Clear to auscultation. Respiratory effort normal. Cardiovascular system: S1 & S2 heard, RRR. No JVD, murmurs, rubs, gallops or clicks. No pedal edema. Gastrointestinal system: Abdomen is nondistended, soft and nontender. No organomegaly or masses felt. Normal bowel sounds heard. Central nervous system: Alert and oriented. No focal neurological deficits. Extremities: Left BKA Skin: No rashes, lesions or ulcers Psychiatry: Judgement and insight appear normal. Mood &  affect appropriate.     Data Reviewed: I have personally reviewed following labs and imaging studies  CBC: Recent Labs  Lab 12/24/20 0555 12/25/20 0439 12/25/20 1026 12/25/20 2022 12/25/20 2313 12/26/20 0447 12/27/20 0500  WBC 14.4* 9.6  --   --  10.9* 11.4* 12.9*  NEUTROABS 10.9* 6.2  --   --  7.4 8.1* 8.7*  HGB 9.6* 7.4* 7.1* 7.6* 7.1* 7.1* 8.7*  HCT 28.4* 22.2*  --  21.7* 21.2* 21.6* 26.5*  MCV 89.9 90.6  --   --  89.8 89.6 90.4  PLT 290 196  --   --  188 182 207   Basic Metabolic Panel: Recent Labs  Lab 12/22/20 0535 12/23/20 0655 12/24/20 0555 12/25/20 0439 12/27/20 0500  NA  --  142 137 135 138  K  --  4.5 4.3 3.9 3.9  CL  --  108 104 102 104  CO2  --  26 23 23 24   GLUCOSE  --  105* 117* 114* 107*  BUN  --  32* 32* 38* 31*  CREATININE 0.94 0.97 1.05 1.15 0.98  CALCIUM  --  9.1 7.9* 7.4* 8.3*  MG  --   --  1.8 1.9 2.2   GFR: Estimated Creatinine Clearance: 114.1 mL/min (by C-G formula based on SCr of 0.98 mg/dL). Liver Function Tests: No results for input(s): AST, ALT, ALKPHOS, BILITOT, PROT, ALBUMIN in the last 168 hours. No results for input(s): LIPASE, AMYLASE in the last 168 hours. No results for input(s): AMMONIA in the last 168 hours. Coagulation Profile: No results for input(s): INR, PROTIME in the last 168 hours. Cardiac Enzymes: No results for input(s): CKTOTAL, CKMB, CKMBINDEX, TROPONINI in the last 168 hours. BNP (last 3 results) No results for input(s): PROBNP in the last 8760 hours. HbA1C: No results for input(s): HGBA1C in the last 72 hours. CBG: Recent Labs  Lab 12/26/20 0738 12/26/20 1230 12/26/20 1622 12/26/20 2015 12/27/20 0815  GLUCAP 110* 113* 141* 113* 109*   Lipid Profile: No results for input(s): CHOL, HDL, LDLCALC, TRIG, CHOLHDL, LDLDIRECT in the last 72 hours. Thyroid Function Tests: No results for input(s): TSH, T4TOTAL, FREET4, T3FREE, THYROIDAB in the last 72 hours. Anemia Panel: Recent Labs    12/25/20 0439   TIBC 174*  IRON 41*   Sepsis Labs: No results for input(s): PROCALCITON, LATICACIDVEN in the last 168 hours.  Recent Results (from the past 240 hour(s))  Urine Culture     Status: Abnormal   Collection Time: 12/17/20  3:51 PM   Specimen: Urine, Random  Result Value Ref Range Status   Specimen Description   Final    URINE, RANDOM Performed at [facility], [address], [place], [place]    Special Requests   Final    NONE Performed at [facility], [address] [place], [place]    Culture MULTIPLE SPECIES PRESENT, SUGGEST RECOLLECTION (A)  Final   Report Status 12/19/2020 FINAL  Final  SARS CORONAVIRUS 2 (TAT 6-24 HRS) Nasopharyngeal Nasopharyngeal Swab     Status: None   Collection Time: 12/17/20  3:55 PM   Specimen: Nasopharyngeal Swab  Result Value Ref Range Status   SARS Coronavirus 2 NEGATIVE NEGATIVE Final    Comment: (NOTE) SARS-CoV-2 target nucleic acids are NOT DETECTED.  The SARS-CoV-2 RNA is generally detectable in upper and lower respiratory specimens during the acute phase of infection. Negative results do not preclude SARS-CoV-2 infection, do not rule out co-infections with other pathogens, and should not be used as the sole basis for treatment or other patient management decisions. Negative results must be combined with clinical observations, patient history, and epidemiological information. The expected result is Negative.  Fact Sheet for Patients: HairSlick.no  Fact Sheet for Healthcare Providers: quierodirigir.com  This test is not yet approved or cleared by the [place] FDA and  has been authorized for detection and/or diagnosis of SARS-CoV-2 by FDA under an Emergency Use Authorization (EUA). This EUA will remain  in effect (meaning this test can be used) for the duration of the COVID-19 declaration under Se ction 564(b)(1) of the Act, 21  U.S.C. section 360bbb-3(b)(1), unless the authorization is terminated or revoked sooner.  Performed at [facility], [address] [place], [place] [address]   Culture, blood (Routine X 2) w Reflex to ID Panel     Status: None   Collection Time: 12/17/20  7:17 PM   Specimen: BLOOD  Result Value Ref Range Status   Specimen Description BLOOD BLOOD LEFT FOREARM  Final   Special Requests   Final    BOTTLES DRAWN AEROBIC ONLY Blood Culture results may not be optimal due to an inadequate volume of blood received in culture bottles   Culture   Final    NO GROWTH 5 DAYS Performed at [facility], [address], [place], [place] [address]    Report Status 12/22/2020 FINAL  Final  Culture, blood (Routine X 2) w Reflex to ID Panel     Status: None   Collection Time: 12/17/20  8:42 PM   Specimen: BLOOD  Result Value Ref Range Status   Specimen Description BLOOD BLOOD LEFT HAND  Final   Special Requests   Final    BOTTLES DRAWN AEROBIC AND ANAEROBIC Blood Culture adequate volume   Culture   Final    NO GROWTH 5 DAYS Performed at [facility], [address] [place], [place] [address]    Report Status 12/22/2020 FINAL  Final         Radiology Studies: No results found.      Scheduled Meds: . atorvastatin  40 mg Oral QHS  . enoxaparin (LOVENOX) injection  0.5 mg/kg Subcutaneous Q24H  . ferrous sulfate  325 mg Oral BID WC  . fluticasone furoate-vilanterol  1 puff Inhalation Daily  . gabapentin  100 mg Oral TID  . insulin aspart  0-6 Units Subcutaneous TID WC  . levothyroxine  75 mcg Oral QAC breakfast  . montelukast  10 mg Oral Daily  . multivitamin with minerals  1 tablet Oral Daily  . nutrition supplement (JUVEN)  1 packet Oral BID BM  . oxcarbazepine  600 mg Oral BID  . pantoprazole  80 mg Oral BID  . Ensure Max Protein  11 oz Oral BID BM  . tamsulosin  0.4 mg Oral QPC supper  . umeclidinium bromide  1 puff Inhalation Daily  . ziprasidone   40 mg Oral BID WC  Continuous Infusions:   LOS: 10 days    Time spent: 28 minutes    Marrion Coyekui Ranen Doolin, MD [facility]   To contact the attending provider between 7A-7P or the covering provider during after hours 7P-7A, please log into the web site [email or web address] and access using universal [place] password for that web site. If you do not have the password, please call the hospital operator.  12/27/2020, 8:32 AM

## 2020-12-28 ENCOUNTER — Inpatient Hospital Stay: Payer: Medicare Other

## 2020-12-28 DIAGNOSIS — R5081 Fever presenting with conditions classified elsewhere: Secondary | ICD-10-CM | POA: Diagnosis not present

## 2020-12-28 DIAGNOSIS — M79605 Pain in left leg: Secondary | ICD-10-CM

## 2020-12-28 DIAGNOSIS — R338 Other retention of urine: Secondary | ICD-10-CM | POA: Diagnosis not present

## 2020-12-28 DIAGNOSIS — M86172 Other acute osteomyelitis, left ankle and foot: Secondary | ICD-10-CM | POA: Diagnosis not present

## 2020-12-28 LAB — CBC WITH DIFFERENTIAL/PLATELET
Abs Immature Granulocytes: 0.16 10*3/uL — ABNORMAL HIGH (ref 0.00–0.07)
Basophils Absolute: 0 10*3/uL (ref 0.0–0.1)
Basophils Relative: 0 %
Eosinophils Absolute: 0.1 10*3/uL (ref 0.0–0.5)
Eosinophils Relative: 1 %
HCT: 23.8 % — ABNORMAL LOW (ref 39.0–52.0)
Hemoglobin: 7.9 g/dL — ABNORMAL LOW (ref 13.0–17.0)
Immature Granulocytes: 1 %
Lymphocytes Relative: 14 %
Lymphs Abs: 1.6 10*3/uL (ref 0.7–4.0)
MCH: 29.9 pg (ref 26.0–34.0)
MCHC: 33.2 g/dL (ref 30.0–36.0)
MCV: 90.2 fL (ref 80.0–100.0)
Monocytes Absolute: 1.3 10*3/uL — ABNORMAL HIGH (ref 0.1–1.0)
Monocytes Relative: 11 %
Neutro Abs: 8.3 10*3/uL — ABNORMAL HIGH (ref 1.7–7.7)
Neutrophils Relative %: 73 %
Platelets: 223 10*3/uL (ref 150–400)
RBC: 2.64 MIL/uL — ABNORMAL LOW (ref 4.22–5.81)
RDW: 14.7 % (ref 11.5–15.5)
WBC: 11.6 10*3/uL — ABNORMAL HIGH (ref 4.0–10.5)
nRBC: 0.2 % (ref 0.0–0.2)

## 2020-12-28 LAB — GLUCOSE, CAPILLARY
Glucose-Capillary: 125 mg/dL — ABNORMAL HIGH (ref 70–99)
Glucose-Capillary: 133 mg/dL — ABNORMAL HIGH (ref 70–99)
Glucose-Capillary: 112 mg/dL — ABNORMAL HIGH (ref 70–99)
Glucose-Capillary: 134 mg/dL — ABNORMAL HIGH (ref 70–99)

## 2020-12-28 LAB — PROCALCITONIN: Procalcitonin: 0.16 ng/mL

## 2020-12-28 LAB — SURGICAL PATHOLOGY

## 2020-12-28 MED ORDER — SODIUM CHLORIDE 0.9 % IV SOLN
300.0000 mg | Freq: Once | INTRAVENOUS | Status: AC
  Administered 2020-12-28: 300 mg via INTRAVENOUS
  Filled 2020-12-28: qty 15

## 2020-12-28 MED ORDER — BACITRACIN ZINC 500 UNIT/GM EX OINT
TOPICAL_OINTMENT | Freq: Every day | CUTANEOUS | Status: DC
  Filled 2020-12-28: qty 0.9

## 2020-12-28 MED ORDER — CYANOCOBALAMIN 1000 MCG/ML IJ SOLN
1000.0000 ug | Freq: Once | INTRAMUSCULAR | Status: AC
  Administered 2020-12-28: 10:00:00 1000 ug via INTRAMUSCULAR
  Filled 2020-12-28 (×2): qty 1

## 2020-12-28 MED ORDER — AMOXICILLIN-POT CLAVULANATE 875-125 MG PO TABS
1.0000 | ORAL_TABLET | Freq: Two times a day (BID) | ORAL | Status: DC
  Administered 2020-12-28 – 2020-12-30 (×5): 1 via ORAL
  Filled 2020-12-28 (×5): qty 1

## 2020-12-28 MED ORDER — AZELASTINE HCL 0.1 % NA SOLN
1.0000 | Freq: Two times a day (BID) | NASAL | Status: DC
  Administered 2020-12-28 – 2020-12-30 (×5): 1 via NASAL
  Filled 2020-12-28: qty 30

## 2020-12-28 MED ORDER — DOXYCYCLINE HYCLATE 100 MG PO TABS
100.0000 mg | ORAL_TABLET | Freq: Two times a day (BID) | ORAL | Status: DC
  Administered 2020-12-28 – 2020-12-30 (×5): 100 mg via ORAL
  Filled 2020-12-28 (×5): qty 1

## 2020-12-28 NOTE — Progress Notes (Signed)
PROGRESS NOTE    Adrian [name].  UKG:[id number] DOB: Mar 02, 1961 DOA: 12/17/2020 PCP: [name]   Chief complaint.  Left foot ulcer Brief Narrative:  The patient is a 60 yr old man who presented to [facility] ED on 12/17/2020 on the instruction of his podiatrist Dr. Ether Griffins due to 2 weeks of worsening erythema, swelling and pain associated with a chronic wound of the plantar aspect of the left midfoot. Patient has been seen by podiatry and vascular surgery, currently covered with vancomycin and Unasyn. MRI showed acute on chronic osteomyelitis. BKA performedon3/10.   Assessment & Plan:   Principal Problem:   Diabetic foot ulcer (HCC) Active Problems:   Asthma, chronic   Morbid obesity (HCC)   Essential hypertension, benign   OSA (obstructive sleep apnea)   Obesity hypoventilation syndrome (HCC)   Bipolar I disorder (HCC)   Osteomyelitis of left foot (HCC)   Hyperlipidemia, unspecified   DM2 (diabetes mellitus, type 2) (HCC)   Acute blood loss anemia   Acute urinary retention  #1.  New fever. Patient had another episode of fever last night with temperature 100.5.  Although he is procalcitonin level was less then 0.1 yesterday, it is 0.16 today.  I still believe this is due to leg hematoma.  Currently, patient does not have any respiratory symptoms, no urinary symptoms, no GI symptoms. I have discussed with vascular surgery, he will be seen again today. Repeat a procalcitonin level tomorrow, I will go ahead and obtain a blood culture. Added antibiotics with doxycycline and Augmentin.  #2.  Left foot osteomyelitis, status post BKA. Acute blood loss anemia. Iron deficient anemia. Borderline B12 level. Hemoglobin is more stable today, he received IV iron yesterday, I will give another dose IV iron, give B12 shot waiting for homocystine. Patient has completed antibiotics after BKA.  However restart oral antibiotics for new fever.  3.  Urinary  retention. Condition has resolved  4.  Type 2 diabetes.  Continue current regimen.     DVT prophylaxis: Lovenox Code Status: Full Family Communication: No family listed Disposition Plan:  .   Status is: Inpatient  Remains inpatient appropriate because:Inpatient level of care appropriate due to severity of illness   Dispo: The patient is from: Home              Anticipated d/c is to: SNF              Patient currently is not medically stable to d/c.   Difficult to place patient No        I/O last 3 completed shifts: In: 2435.9 [P.O.:974; Other:1185; IV Piggyback:276.9] Out: 3900 [Urine:3900] No intake/output data recorded.     Consultants:   Vascular surgery  Procedures: BKA  Antimicrobials: Doxycycline and Augmentin.  Subjective: Patient had another episode of low-grade fever at 100.5 yesterday evening, otherwise he is doing well.  He denies any short of breath or cough. He does not have any dysuria hematuria, he was able to urinate without Foley catheter. He does not have any diarrhea or abdominal pain, no nausea vomiting. His stump bleeding seems to be improved, but there is a hematoma formed.  Objective: Vitals:   12/27/20 1543 12/27/20 2103 12/28/20 0425 12/28/20 0835  BP: (!) 145/64 135/68 131/65 137/70  Pulse: 100 99 88 (!) 102  Resp: 18 16 16 15   Temp: 99.8 F (37.7 C) (!) 100.5 F (38.1 C) 99.2 F (37.3 C) 99.9 F (37.7 C)  TempSrc:  Oral Oral  Oral  SpO2: 100% 93% 98% 95%  Weight:      Height:        Intake/Output Summary (Last 24 hours) at 12/28/2020 0940 Last data filed at 12/28/2020 0426 Gross per 24 hour  Intake 893.9 ml  Output 2000 ml  Net -1106.1 ml   Filed Weights   12/20/20 0545 12/23/20 0100 12/25/20 0500  Weight: 135.1 kg 132.8 kg 135.6 kg    Examination:  General exam: Appears calm and comfortable  Respiratory system: Clear to auscultation. Respiratory effort normal. Cardiovascular system: S1 & S2 heard, RRR. No  JVD, murmurs, rubs, gallops or clicks. No pedal edema. Gastrointestinal system: Abdomen is nondistended, soft and nontender. No organomegaly or masses felt. Normal bowel sounds heard. Central nervous system: Alert and oriented. No focal neurological deficits. Extremities: Left BKA Skin: No rashes, lesions or ulcers Psychiatry:  Mood & affect appropriate.     Data Reviewed: I have personally reviewed following labs and imaging studies  CBC: Recent Labs  Lab 12/25/20 0439 12/25/20 1026 12/25/20 2022 12/25/20 2313 12/26/20 0447 12/27/20 0500 12/28/20 0501  WBC 9.6  --   --  10.9* 11.4* 12.9* 11.6*  NEUTROABS 6.2  --   --  7.4 8.1* 8.7* 8.3*  HGB 7.4*   < > 7.6* 7.1* 7.1* 8.7* 7.9*  HCT 22.2*  --  21.7* 21.2* 21.6* 26.5* 23.8*  MCV 90.6  --   --  89.8 89.6 90.4 90.2  PLT 196  --   --  188 182 207 223   < > = values in this interval not displayed.   Basic Metabolic Panel: Recent Labs  Lab 12/22/20 0535 12/23/20 0655 12/24/20 0555 12/25/20 0439 12/27/20 0500  NA  --  142 137 135 138  K  --  4.5 4.3 3.9 3.9  CL  --  108 104 102 104  CO2  --  26 23 23 24   GLUCOSE  --  105* 117* 114* 107*  BUN  --  32* 32* 38* 31*  CREATININE 0.94 0.97 1.05 1.15 0.98  CALCIUM  --  9.1 7.9* 7.4* 8.3*  MG  --   --  1.8 1.9 2.2   GFR: Estimated Creatinine Clearance: 114.1 mL/min (by C-G formula based on SCr of 0.98 mg/dL). Liver Function Tests: No results for input(s): AST, ALT, ALKPHOS, BILITOT, PROT, ALBUMIN in the last 168 hours. No results for input(s): LIPASE, AMYLASE in the last 168 hours. No results for input(s): AMMONIA in the last 168 hours. Coagulation Profile: No results for input(s): INR, PROTIME in the last 168 hours. Cardiac Enzymes: No results for input(s): CKTOTAL, CKMB, CKMBINDEX, TROPONINI in the last 168 hours. BNP (last 3 results) No results for input(s): PROBNP in the last 8760 hours. HbA1C: No results for input(s): HGBA1C in the last 72 hours. CBG: Recent Labs   Lab 12/27/20 0815 12/27/20 1159 12/27/20 1543 12/27/20 2104 12/28/20 0835  GLUCAP 109* 128* 118* 114* 125*   Lipid Profile: No results for input(s): CHOL, HDL, LDLCALC, TRIG, CHOLHDL, LDLDIRECT in the last 72 hours. Thyroid Function Tests: No results for input(s): TSH, T4TOTAL, FREET4, T3FREE, THYROIDAB in the last 72 hours. Anemia Panel: Recent Labs    12/27/20 0500 12/27/20 0918  VITAMINB12  --  272  TIBC 181*  --   IRON 24*  --    Sepsis Labs: Recent Labs  Lab 12/27/20 0500 12/28/20 0501  PROCALCITON <0.10 0.16    Recent Results (from the past 240 hour(s))  Urine Culture  Status: None   Collection Time: 12/26/20  7:00 AM   Specimen: Urine, Random  Result Value Ref Range Status   Specimen Description   Final    URINE, RANDOM Performed at [facility], [address], [place], [place] [address]    Special Requests   Final    NONE Performed at [facility], [address], [place], [place] [address]    Culture   Final    NO GROWTH Performed at [facility], [address] [place], [place] [address]    Report Status 12/27/2020 FINAL  Final         Radiology Studies: No results found.      Scheduled Meds: . amoxicillin-clavulanate  1 tablet Oral Q12H  . atorvastatin  40 mg Oral QHS  . azelastine  1 spray Each Nare BID  . cyanocobalamin  1,000 mcg Intramuscular Once  . doxycycline  100 mg Oral Q12H  . enoxaparin (LOVENOX) injection  0.5 mg/kg Subcutaneous Q24H  . ferrous sulfate  325 mg Oral BID WC  . fluticasone furoate-vilanterol  1 puff Inhalation Daily  . gabapentin  100 mg Oral TID  . insulin aspart  0-6 Units Subcutaneous TID WC  . levothyroxine  75 mcg Oral QAC breakfast  . montelukast  10 mg Oral Daily  . multivitamin with minerals  1 tablet Oral Daily  . nutrition supplement (JUVEN)  1 packet Oral BID BM  . oxcarbazepine  600 mg Oral BID  . pantoprazole  80 mg Oral BID  . Ensure Max Protein  11 oz Oral  BID BM  . tamsulosin  0.4 mg Oral QPC supper  . umeclidinium bromide  1 puff Inhalation Daily  . ziprasidone  40 mg Oral BID WC   Continuous Infusions:   LOS: 11 days    Time spent: 28 minutes    Marrion Coy, MD [facility]   To contact the attending provider between 7A-7P or the covering provider during after hours 7P-7A, please log into the web site [email or web address] and access using universal [place] password for that web site. If you do not have the password, please call the hospital operator.  12/28/2020, 9:40 AM

## 2020-12-28 NOTE — Progress Notes (Signed)
Physical Therapy Treatment Patient Details Name: Adrian [name]. MRN: [id number] DOB: 1960/10/20 Today's Date: 12/28/2020    History of Present Illness 60 yr old man here due to 2 weeks of worsening erythema, swelling and pain associated with a chronic wound of the plantar aspect of the left midfoot. 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    PT Comments    Pt reported feeling poorly today.  Declined any sitting or mobility attempts  Does agree to supine ex and education.  Very quiet today and less engaged as yesterday.    Follow Up Recommendations  SNF     Equipment Recommendations  Wheelchair (measurements PT);Wheelchair cushion (measurements PT);Other (comment)    Recommendations for Other Services       Precautions / Restrictions Precautions Precautions: Fall Restrictions Weight Bearing Restrictions: Yes Other Position/Activity Restrictions: s/p L BKA - NWB    Mobility  Bed Mobility               General bed mobility comments: refused today    Transfers                    Ambulation/Gait                 Stairs             Wheelchair Mobility    Modified Rankin (Stroke Patients Only)       Balance                                            Cognition Arousal/Alertness: Awake/alert Behavior During Therapy: WFL for tasks assessed/performed Overall Cognitive Status: Within Functional Limits for tasks assessed                                        Exercises Other Exercises Other Exercises: BLE ex x 10 within  RLE amp limitations    General Comments        Pertinent Vitals/Pain Pain Assessment: Faces Faces Pain Scale: Hurts little more Pain Location: L LE with mobility Pain Descriptors / Indicators: Grimacing;Sore Pain Intervention(s): Limited activity within patient's tolerance;Monitored during session;Repositioned    Home Living                      Prior Function            PT Goals  (current goals can now be found in the care plan section) Progress towards PT goals: Progressing toward goals    Frequency    7X/week      PT Plan Current plan remains appropriate    Co-evaluation              AM-PAC PT "6 Clicks" Mobility   Outcome Measure  Help needed turning from your back to your side while in a flat bed without using bedrails?: A Little Help needed moving from lying on your back to sitting on the side of a flat bed without using bedrails?: A Lot Help needed moving to and from a bed to a chair (including a wheelchair)?: A Lot Help needed standing up from a chair using your arms (e.g., wheelchair or bedside chair)?: Total Help needed to walk in hospital room?: Total Help needed climbing 3-5 steps with a railing? : Total 6  Click Score: 10    End of Session   Activity Tolerance: Patient limited by fatigue;Other (comment) Patient left: with bed alarm set;with call bell/phone within reach;in bed Nurse Communication: Mobility status PT Visit Diagnosis: Muscle weakness (generalized) (M62.81);Difficulty in walking, not elsewhere classified (R26.2)     Time: 6962-9528 PT Time Calculation (min) (ACUTE ONLY): 9 min  Charges:  $Therapeutic Exercise: 8-22 mins                    Danielle Dess, PTA 12/28/20, 1:43 PM]

## 2020-12-28 NOTE — Progress Notes (Signed)
[facility] Daily Progress Note  Subjective: 12/23/20: Left below-the-knee amputation  Patient without complaint this AM. Pain seems to be well controlled. No acute issues overnight.  Objective: Vitals:   12/27/20 1543 12/27/20 2103 12/28/20 0425 12/28/20 0835  BP: (!) 145/64 135/68 131/65 137/70  Pulse: 100 99 88 (!) 102  Resp: 18 16 16 15   Temp: 99.8 F (37.7 C) (!) 100.5 F (38.1 C) 99.2 F (37.3 C) 99.9 F (37.7 C)  TempSrc:  Oral Oral Oral  SpO2: 100% 93% 98% 95%  Weight:      Height:        Intake/Output Summary (Last 24 hours) at 12/28/2020 1126 Last data filed at 12/28/2020 1013 Gross per 24 hour  Intake 1133.9 ml  Output 2000 ml  Net -866.1 ml   Physical Exam: A&Ox3, NAD CV: RRR Pulmonary: CTA Bilaterally Abdomen: Soft, Non-tender, Non-distended Vascular:             Left Lower Extremity: Thigh soft. Flexible at the knee joint. Dressing removed.  Less bloody drainage noted.  Medial aspect of stump looks more perfused this AM.  Less dusky. Incision: Staples are intact.  Minimal oozing from two staple sites.  New dressing placed.  Some blisters noted to the stump.   Laboratory: CBC    Component Value Date/Time   WBC 11.6 (H) 12/28/2020 0501   HGB 7.9 (L) 12/28/2020 0501   HGB 9.6 (L) 04/26/2014 0309   HCT 23.8 (L) 12/28/2020 0501   HCT 29.0 (L) 04/26/2014 0309   PLT 223 12/28/2020 0501   PLT 331 04/26/2014 0309   BMET    Component Value Date/Time   NA 138 12/27/2020 0500   NA 143 04/26/2014 0309   K 3.9 12/27/2020 0500   K 4.1 04/26/2014 0309   CL 104 12/27/2020 0500   CL 106 04/26/2014 0309   CO2 24 12/27/2020 0500   CO2 27 04/26/2014 0309   GLUCOSE 107 (H) 12/27/2020 0500   GLUCOSE 100 (H) 04/26/2014 0309   BUN 31 (H) 12/27/2020 0500   BUN 11 04/26/2014 0309   CREATININE 0.98 12/27/2020 0500   CREATININE 1.08 04/26/2014 0309   CALCIUM 8.3 (L) 12/27/2020 0500   CALCIUM 8.5 04/26/2014 0309   GFRNONAA >60 12/27/2020 0500    GFRNONAA >60 04/26/2014 0309   GFRAA >60 09/19/2018 1156   GFRAA >60 04/26/2014 0309   Assessment/Planning: The patient is a 60 year old male with left Charcot foot deformity with chronic osteomyelitis status post below the knee amputationPOD#5  1) Persistent leukocytosis.  Dressing changed today.  Stump seems to be perfused well.  Warm to touch.  Nontender to palpation.  No further drainage from incision.  Medial aspect of stump less dusky today.  Scattered blisters to the stump. Flexible at the knee. Thigh soft. 2) Will order bacitracin twice daily to areas of stump blisters. 3) In setting of persistent leukocytosis and low-grade fever we will also rule out DVT.  Will order bilateral lower extremity venous duplex. 4) Encouraged out of bed to chair, walking/ambulation and attempt to combat any atelectasis/pneumonias.  Incentive spirometry. 5) Agree with PT/OT recommendations for dispo to SNF.  Discussed with Dr. 46 Lealand Elting PA-C 12/28/2020 11:26 AM

## 2020-12-28 NOTE — Progress Notes (Signed)
Occupational Therapy Treatment Patient Details Name: Adrian [name]. MRN: [id number] DOB: 1961-07-16 Today's Date: 12/28/2020    History of present illness 60 yr old man here due to worsening erythema, swelling and pain associated with a chronic wound of the plantar aspect of the left midfoot. 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   OT comments  Pt seen for OT treatment on this date. Upon arrival to room pt awake and seated upright in bed. Pt alert and agreeable to bed-level ADLs, however deferred further mobility d/t pt reported dizziness with change in position during previous bed mobility attempts. Pt educated on importance of engaging in progressive functional mobility, however pt talking over multiple attempts to educate. Pt currently requires SET-UP assistance to perform bed-level oral care and wash/dry face. Pt stated that he wanted to shave face, however when encouraged by this author to attempt on his own first, pt refused to engage in task. Pt continues to benefit from skilled OT services to maximize return to PLOF and minimize risk of future falls, injury, caregiver burden, and readmission. Will continue to follow POC. Discharge recommendation remains appropriate.    Follow Up Recommendations  SNF    Equipment Recommendations  3 in 1 bedside commode;Tub/shower seat;Wheelchair (measurements OT);Wheelchair cushion (measurements OT)       Precautions / Restrictions Precautions Precautions: Fall Restrictions Weight Bearing Restrictions: Yes Other Position/Activity Restrictions: s/p L BKA - NWB       Mobility Bed Mobility               General bed mobility comments: pt refused d/t previous experience of dizziness with change in position                        ADL either performed or assessed with clinical judgement   ADL Overall ADL's : Needs assistance/impaired     Grooming: Wash/dry face;Oral care;Set up;Bed level Grooming Details (indicate cue type and reason): Pt stated  that he wanted to shave face, however when encouraged to attempt on his own first, pt refused to engage in task                                               Cognition Arousal/Alertness: Awake/alert Behavior During Therapy: [facility] for tasks assessed/performed Overall Cognitive Status: Within Functional Limits for tasks assessed                                 General Comments: Pt alert and agreeable to bed-level ADLs, however deferred further mobility d/t pt reported dizziness with change in position. Pt educated on importance of engaging in bed and OOB mobility, however pt talking over multiple attempts to educate.                   Pertinent Vitals/ Pain       Pain Assessment: Faces Faces Pain Scale: Hurts little more Pain Location: "phantom limb pain" Pain Descriptors / Indicators: Grimacing;Sore Pain Intervention(s): Limited activity within patient's tolerance;Monitored during session         Frequency  Min 1X/week        Progress Toward Goals  OT Goals(current goals can now be found in the care plan section)  Progress towards OT goals: Progressing toward goals  Acute Rehab OT Goals Patient  Stated Goal: going back to group home OT Goal Formulation: With patient Time For Goal Achievement: 01/08/21 Potential to Achieve Goals: Fair  Plan Discharge plan remains appropriate;Frequency remains appropriate       AM-PAC OT "6 Clicks" Daily Activity     Outcome Measure   Help from another person eating meals?: None Help from another person taking care of personal grooming?: A Little Help from another person toileting, which includes using toliet, bedpan, or urinal?: A Lot Help from another person bathing (including washing, rinsing, drying)?: A Lot Help from another person to put on and taking off regular upper body clothing?: A Little Help from another person to put on and taking off regular lower body clothing?: A Lot 6 Click Score:  16    End of Session    OT Visit Diagnosis: Other abnormalities of gait and mobility (R26.89);Muscle weakness (generalized) (M62.81)   Activity Tolerance Patient tolerated treatment well   Patient Left in bed;with call bell/phone within reach;with bed alarm set   Nurse Communication Mobility status;Other (comment) (request for condom catheter bag)        Time: 7829-5621 OT Time Calculation (min): 18 min  Charges: OT General Charges $OT Visit: 1 Visit OT Treatments $Self Care/Home Management : 8-22 mins   Matthew Folks, OTR/L [facility] [phone]

## 2020-12-29 DIAGNOSIS — M86172 Other acute osteomyelitis, left ankle and foot: Secondary | ICD-10-CM | POA: Diagnosis not present

## 2020-12-29 DIAGNOSIS — I10 Essential (primary) hypertension: Secondary | ICD-10-CM | POA: Diagnosis not present

## 2020-12-29 DIAGNOSIS — D62 Acute posthemorrhagic anemia: Secondary | ICD-10-CM | POA: Diagnosis not present

## 2020-12-29 LAB — BASIC METABOLIC PANEL
Anion gap: 10 (ref 5–15)
BUN: 33 mg/dL — ABNORMAL HIGH (ref 6–20)
CO2: 26 mmol/L (ref 22–32)
Calcium: 8.5 mg/dL — ABNORMAL LOW (ref 8.9–10.3)
Chloride: 104 mmol/L (ref 98–111)
Creatinine, Ser: 0.88 mg/dL (ref 0.61–1.24)
GFR, Estimated: >60 mL/min (ref >60)
Glucose, Bld: 108 mg/dL — ABNORMAL HIGH (ref 70–99)
Potassium: 3.5 mmol/L (ref 3.5–5.1)
Sodium: 140 mmol/L (ref 135–145)

## 2020-12-29 LAB — GLUCOSE, CAPILLARY
Glucose-Capillary: 133 mg/dL — ABNORMAL HIGH (ref 70–99)
Glucose-Capillary: 125 mg/dL — ABNORMAL HIGH (ref 70–99)
Glucose-Capillary: 134 mg/dL — ABNORMAL HIGH (ref 70–99)
Glucose-Capillary: 165 mg/dL — ABNORMAL HIGH (ref 70–99)

## 2020-12-29 LAB — PROCALCITONIN: Procalcitonin: 0.1 ng/mL

## 2020-12-29 LAB — CBC WITH DIFFERENTIAL/PLATELET
Abs Immature Granulocytes: 0.16 10*3/uL — ABNORMAL HIGH (ref 0.00–0.07)
Basophils Absolute: 0 10*3/uL (ref 0.0–0.1)
Basophils Relative: 0 %
Eosinophils Absolute: 0.2 10*3/uL (ref 0.0–0.5)
Eosinophils Relative: 1 %
HCT: 24.8 % — ABNORMAL LOW (ref 39.0–52.0)
Hemoglobin: 8.1 g/dL — ABNORMAL LOW (ref 13.0–17.0)
Immature Granulocytes: 1 %
Lymphocytes Relative: 14 %
Lymphs Abs: 1.6 10*3/uL (ref 0.7–4.0)
MCH: 29.9 pg (ref 26.0–34.0)
MCHC: 32.7 g/dL (ref 30.0–36.0)
MCV: 91.5 fL (ref 80.0–100.0)
Monocytes Absolute: 1.1 10*3/uL — ABNORMAL HIGH (ref 0.1–1.0)
Monocytes Relative: 10 %
Neutro Abs: 8.4 10*3/uL — ABNORMAL HIGH (ref 1.7–7.7)
Neutrophils Relative %: 74 %
Platelets: 256 10*3/uL (ref 150–400)
RBC: 2.71 MIL/uL — ABNORMAL LOW (ref 4.22–5.81)
RDW: 14.6 % (ref 11.5–15.5)
WBC: 11.4 10*3/uL — ABNORMAL HIGH (ref 4.0–10.5)
nRBC: 0 % (ref 0.0–0.2)

## 2020-12-29 LAB — HOMOCYSTEINE: Homocysteine: 14.2 umol/L (ref 0.0–14.5)

## 2020-12-29 LAB — MAGNESIUM: Magnesium: 2 mg/dL (ref 1.7–2.4)

## 2020-12-29 MED ORDER — CYANOCOBALAMIN 1000 MCG/ML IJ SOLN
1000.0000 ug | Freq: Once | INTRAMUSCULAR | Status: AC
  Administered 2020-12-29: 1000 ug via INTRAMUSCULAR
  Filled 2020-12-29: qty 1

## 2020-12-29 MED ORDER — ENOXAPARIN SODIUM 80 MG/0.8ML ~~LOC~~ SOLN
0.5000 mg/kg | SUBCUTANEOUS | Status: DC
  Administered 2020-12-29: 65 mg via SUBCUTANEOUS
  Filled 2020-12-29 (×2): qty 0.8

## 2020-12-29 MED ORDER — POTASSIUM CHLORIDE 20 MEQ PO PACK
40.0000 meq | PACK | Freq: Once | ORAL | Status: AC
  Administered 2020-12-29: 14:00:00 40 meq via ORAL
  Filled 2020-12-29: qty 2

## 2020-12-29 NOTE — TOC Progression Note (Signed)
Transition of Care (TOC) - Progression Note    Patient Details  Name: Torres Hardenbrook. MRN: [id number] Date of Birth: 1961/08/21  [facility]) CM/SW Contact  Allayne Butcher, RN Phone Number: 12/29/2020, 1:55 PM  Clinical Narrative:    Vascular surgery has reported that if patient stays fever free over night he will be stable for discharge to [facility] tomorrow.  RNCM was able to speak with Doristine Mango, owner of the group home, and he will send a copy of the patient's COVID Vaccine card.  [facility] requests a copy of the card before patient discharges to them.   Plan for discharge tomorrow to [facility].   Doristine Mango updated on plan for discharge tomorrow.    Expected Discharge Plan: Skilled Nursing Facility Barriers to Discharge: Continued Medical Work up  Expected Discharge Plan and Services Expected Discharge Plan: Skilled Nursing Facility   Discharge Planning Services: CM Consult Post Acute Care Choice: Skilled Nursing Facility Living arrangements for the past 2 months: Group Home                 DME Arranged: N/A DME Agency: NA                   Social Determinants of Health (SDOH) Interventions    Readmission Risk Interventions No flowsheet data found.

## 2020-12-29 NOTE — Progress Notes (Signed)
Physical Therapy Treatment Patient Details Name: Adrian [name]. MRN: [id number] DOB: 1960-11-17 Today's Date: 12/29/2020    History of Present Illness 60 yr old man here due to worsening erythema, swelling and pain associated with a chronic wound of the plantar aspect of the left midfoot. 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    PT Comments    Pt ready for session.  Feeling better.  Positioned with LLE in ext rotation and knee flexed.  Education provided again along with stretching ex.  To EOB with rail and min guard.  Steady in sitting.  He does continue to report dizziness in sitting 5/10 but BP's stable.  Supine 140/68 P 95  Sitting 144/78 P 101.  He does sit for several minutes EOB today and is able to lateral scoot about 3' up in bed in sitting with R foot braced to prevent sliding but no physical assist to move along bed or LOB's,  He does take a self initiated rest breaks and needs encouragement to try again.  Returned to supine with supervision.  Condom cath did come off during transition and tech notified to replace.   Follow Up Recommendations  SNF     Equipment Recommendations  Wheelchair (measurements PT);Wheelchair cushion (measurements PT);Other (comment)    Recommendations for Other Services       Precautions / Restrictions Precautions Precautions: Fall Restrictions Weight Bearing Restrictions: Yes LLE Weight Bearing: Non weight bearing Other Position/Activity Restrictions: s/p L BKA - NWB    Mobility  Bed Mobility Overal bed mobility: Needs Assistance Bed Mobility: Supine to Sit;Sit to Supine     Supine to sit: Supervision;Min guard;HOB elevated          Transfers Overall transfer level: Needs assistance Equipment used: None Transfers: Lateral/Scoot Transfers          Lateral/Scoot Transfers: Min assist General transfer comment: bracing R foot to prevent sliding on floor but no physical assist  Ambulation/Gait                 Stairs              Wheelchair Mobility    Modified Rankin (Stroke Patients Only)       Balance Overall balance assessment: Needs assistance Sitting-balance support: Bilateral upper extremity supported Sitting balance-Leahy Scale: Good Sitting balance - Comments: good static and with lateral scooting                                    Cognition Arousal/Alertness: Awake/alert Behavior During Therapy: WFL for tasks assessed/performed Overall Cognitive Status: Within Functional Limits for tasks assessed                                        Exercises Other Exercises Other Exercises: education on positioning for LLE    General Comments        Pertinent Vitals/Pain Pain Assessment: Faces Faces Pain Scale: Hurts a little bit Pain Location: "phantom limb pain" Pain Descriptors / Indicators: Grimacing;Sore Pain Intervention(s): Limited activity within patient's tolerance;Monitored during session;Repositioned    Home Living                      Prior Function            PT Goals (current goals can now be found in the  care plan section) Progress towards PT goals: Progressing toward goals    Frequency    7X/week      PT Plan Current plan remains appropriate    Co-evaluation              AM-PAC PT "6 Clicks" Mobility   Outcome Measure  Help needed turning from your back to your side while in a flat bed without using bedrails?: A Little Help needed moving from lying on your back to sitting on the side of a flat bed without using bedrails?: A Little Help needed moving to and from a bed to a chair (including a wheelchair)?: A Lot Help needed standing up from a chair using your arms (e.g., wheelchair or bedside chair)?: Total Help needed to walk in hospital room?: Total Help needed climbing 3-5 steps with a railing? : Total 6 Click Score: 11    End of Session   Activity Tolerance: Patient limited by fatigue;Other  (comment) Patient left: with bed alarm set;with call bell/phone within reach;in bed Nurse Communication: Mobility status PT Visit Diagnosis: Muscle weakness (generalized) (M62.81);Difficulty in walking, not elsewhere classified (R26.2)     Time: 4818-5631 PT Time Calculation (min) (ACUTE ONLY): 16 min  Charges:  $Therapeutic Activity: 8-22 mins                    Danielle Dess, PTA 12/29/20, 10:50 AM

## 2020-12-29 NOTE — Progress Notes (Signed)
PROGRESS NOTE    Adrian [name].  JQZ:[id number] DOB: 01-15-1961 DOA: 12/17/2020 PCP: [facility]   Chief complaint.  Left foot ulcer. Brief Narrative:  The patient is a 60 yr old man who presented to [facility] ED on 12/17/2020 on the instruction of his podiatrist Dr. Ether Griffins due to 2 weeks of worsening erythema, swelling and pain associated with a chronic wound of the plantar aspect of the left midfoot. Patient has been seen by podiatry and vascular surgery, currently covered with vancomycin and Unasyn. MRI showed acute on chronic osteomyelitis. BKA performedon3/10.   Assessment & Plan:   Principal Problem:   Diabetic foot ulcer (HCC) Active Problems:   Asthma, chronic   Morbid obesity (HCC)   Essential hypertension, benign   OSA (obstructive sleep apnea)   Obesity hypoventilation syndrome (HCC)   Bipolar I disorder (HCC)   Osteomyelitis of left foot (HCC)   Hyperlipidemia, unspecified   DM2 (diabetes mellitus, type 2) (HCC)   Acute blood loss anemia   Acute urinary retention  #1.  Fever. Patient had a fever for 2 days postop of a BKA.  He has some bleeding from the stump, he also had some blisters.  Vascular surgery is following.  I put him on Augmentin and doxycycline 2 days ago. Per vascular surgery, patient can be discharged to SNF tomorrow if there is no fever for another 24 hours.  Repeat blood culture so far negative.  #2.  Left foot osteomyelitis, status post BKA. Acute blood loss anemia. Iron deficient anemia. Borderline B12 level. Patient had a bleeding from the stump, he also has iron deficiency, he received IV iron, currently on oral iron treatment.  He also has borderline B12 level, pending homocystine level.  We will give another dose of subcu B12 injection.  Hemoglobin seem to be better per  3.  Urinary retention.  Condition resolved  4.  Type 2 diabetes.  Continue current regimen.    DVT prophylaxis: Lovenox Code Status:  Full Family Communication:  Disposition Plan:  .   Status is: Inpatient  Remains inpatient appropriate because:Inpatient level of care appropriate due to severity of illness   Dispo: The patient is from: Home              Anticipated d/c is to: SNF              Patient currently is not medically stable to d/c.   Difficult to place patient No        I/O last 3 completed shifts: In: 600 [P.O.:600] Out: 5200 [Urine:5200] Total I/O In: 597 [P.O.:597] Out: 1000 [Urine:1000]     Consultants:   Vascular  Procedures: BKA  Antimicrobials:  Augmentin and doxycycline.  Subjective: Patient doing well today.  No additional bleeding from stump. Denies any abdominal pain or nausea vomiting, had a bowel movement. No short of breath or cough. No fever or chills.  Objective: Vitals:   12/29/20 0500 12/29/20 0508 12/29/20 0926 12/29/20 1304  BP:  126/73 134/72 (!) 150/75  Pulse:  86 90 95  Resp:  16 16 18   Temp:  98.7 F (37.1 C) 99.1 F (37.3 C) 97.9 F (36.6 C)  TempSrc:  Oral    SpO2:  96% 97% 100%  Weight: 129.6 kg     Height:        Intake/Output Summary (Last 24 hours) at 12/29/2020 1324 Last data filed at 12/29/2020 1020 Gross per 24 hour  Intake 957 ml  Output 4200  ml  Net -3243 ml   Filed Weights   12/23/20 0100 12/25/20 0500 12/29/20 0500  Weight: 132.8 kg 135.6 kg 129.6 kg    Examination:  General exam: Appears calm and comfortable  Respiratory system: Clear to auscultation. Respiratory effort normal. Cardiovascular system: S1 & S2 heard, RRR. No JVD, murmurs, rubs, gallops or clicks. No pedal edema. Gastrointestinal system: Abdomen is nondistended, soft and nontender. No organomegaly or masses felt. Normal bowel sounds heard. Central nervous system: Alert and oriented. No focal neurological deficits. Extremities: Left BKA. Skin: No rashes, lesions or ulcers Psychiatry: Judgement and insight appear normal. Mood & affect appropriate.     Data  Reviewed: I have personally reviewed following labs and imaging studies  CBC: Recent Labs  Lab 12/25/20 2313 12/26/20 0447 12/27/20 0500 12/28/20 0501 12/29/20 0522  WBC 10.9* 11.4* 12.9* 11.6* 11.4*  NEUTROABS 7.4 8.1* 8.7* 8.3* 8.4*  HGB 7.1* 7.1* 8.7* 7.9* 8.1*  HCT 21.2* 21.6* 26.5* 23.8* 24.8*  MCV 89.8 89.6 90.4 90.2 91.5  PLT 188 182 207 223 256   Basic Metabolic Panel: Recent Labs  Lab 12/23/20 0655 12/24/20 0555 12/25/20 0439 12/27/20 0500 12/29/20 0522  NA 142 137 135 138 140  K 4.5 4.3 3.9 3.9 3.5  CL 108 104 102 104 104  CO2 26 23 23 24 26   GLUCOSE 105* 117* 114* 107* 108*  BUN 32* 32* 38* 31* 33*  CREATININE 0.97 1.05 1.15 0.98 0.88  CALCIUM 9.1 7.9* 7.4* 8.3* 8.5*  MG  --  1.8 1.9 2.2 2.0   GFR: Estimated Creatinine Clearance: 124 mL/min (by C-G formula based on SCr of 0.88 mg/dL). Liver Function Tests: No results for input(s): AST, ALT, ALKPHOS, BILITOT, PROT, ALBUMIN in the last 168 hours. No results for input(s): LIPASE, AMYLASE in the last 168 hours. No results for input(s): AMMONIA in the last 168 hours. Coagulation Profile: No results for input(s): INR, PROTIME in the last 168 hours. Cardiac Enzymes: No results for input(s): CKTOTAL, CKMB, CKMBINDEX, TROPONINI in the last 168 hours. BNP (last 3 results) No results for input(s): PROBNP in the last 8760 hours. HbA1C: No results for input(s): HGBA1C in the last 72 hours. CBG: Recent Labs  Lab 12/28/20 1228 12/28/20 1711 12/28/20 2111 12/29/20 0923 12/29/20 1207  GLUCAP 133* 112* 134* 133* 125*   Lipid Profile: No results for input(s): CHOL, HDL, LDLCALC, TRIG, CHOLHDL, LDLDIRECT in the last 72 hours. Thyroid Function Tests: No results for input(s): TSH, T4TOTAL, FREET4, T3FREE, THYROIDAB in the last 72 hours. Anemia Panel: Recent Labs    12/27/20 0500 12/27/20 0918  VITAMINB12  --  272  TIBC 181*  --   IRON 24*  --    Sepsis Labs: Recent Labs  Lab 12/27/20 0500 12/28/20 0501  12/29/20 0522  PROCALCITON <0.10 0.16 0.10    Recent Results (from the past 240 hour(s))  Urine Culture     Status: None   Collection Time: 12/26/20  7:00 AM   Specimen: Urine, Random  Result Value Ref Range Status   Specimen Description   Final    URINE, RANDOM Performed at [facility], [address], [place], [place]    Special Requests   Final    NONE Performed at [facility], [address], [place], [place]    Culture   Final    NO GROWTH Performed at [facility], [address] [facility], [place], [place]    Report Status 12/27/2020 FINAL  Final  CULTURE, BLOOD (ROUTINE X 2) w Reflex to ID Panel     Status: None (Preliminary result)   Collection Time: 12/28/20  9:57 AM   Specimen: BLOOD  Result Value Ref Range Status   Specimen Description BLOOD  LEFT HAND   Final   Special Requests   Final    BOTTLES DRAWN AEROBIC AND ANAEROBIC Blood Culture results may not be optimal due to an excessive volume of blood received in culture bottles   Culture   Final    NO GROWTH < 24 HOURS Performed at [facility], [address] [place], [place] [address]    Report Status PENDING  Incomplete  CULTURE, BLOOD (ROUTINE X 2) w Reflex to ID Panel     Status: None (Preliminary result)   Collection Time: 12/28/20 10:02 AM   Specimen: BLOOD  Result Value Ref Range Status   Specimen Description BLOOD LEFT HAND  Final   Special Requests   Final    BOTTLES DRAWN AEROBIC AND ANAEROBIC Blood Culture adequate volume   Culture   Final    NO GROWTH < 24 HOURS Performed at [facility], [address] [place], [place] [address]    Report Status PENDING  Incomplete         Radiology Studies: US Venous Img Lower Bilateral (DVT)  Result Date: 12/29/2020 CLINICAL DATA:  60 year old male with bilateral lower extremity pain. History of recent left below the knee amputation. EXAM: BILATERAL LOWER EXTREMITY VENOUS DOPPLER  ULTRASOUND TECHNIQUE: Gray-scale sonography with graded compression, as well as color Doppler and duplex ultrasound were performed to evaluate the lower extremity deep venous systems from the level of the common femoral vein and including the common femoral, femoral, profunda femoral, popliteal and calf veins including the posterior tibial, peroneal and gastrocnemius veins when visible. The superficial great saphenous vein was also interrogated. Spectral Doppler was utilized to evaluate flow at rest and with distal augmentation maneuvers in the common femoral, femoral and popliteal veins. COMPARISON:  None. FINDINGS: RIGHT LOWER EXTREMITY Common Femoral Vein: No evidence of thrombus. Normal compressibility, respiratory phasicity and response to augmentation. Saphenofemoral Junction: No evidence of thrombus. Normal compressibility and flow on color Doppler imaging. Profunda Femoral Vein: No evidence of thrombus. Normal compressibility and flow on color Doppler imaging. Femoral Vein: No evidence of thrombus. Normal compressibility, respiratory phasicity and response to augmentation. Popliteal Vein: No evidence of thrombus. Normal compressibility, respiratory phasicity and response to augmentation. Calf Veins: No evidence of thrombus. Normal compressibility and flow on color Doppler imaging. Superficial Great Saphenous Vein: No evidence of thrombus. Normal compressibility. Venous Reflux:  None. Other Findings:  None. LEFT LOWER EXTREMITY Common Femoral Vein: No evidence of thrombus. Normal compressibility, respiratory phasicity and response to augmentation. Saphenofemoral Junction: No evidence of thrombus. Normal compressibility and flow on color Doppler imaging. Profunda Femoral Vein: No evidence of thrombus. Normal compressibility and flow on color Doppler imaging. Femoral Vein: No evidence of thrombus. Normal compressibility, respiratory phasicity and response to augmentation. Popliteal Vein: No evidence of  thrombus. Normal compressibility, respiratory phasicity and response to augmentation. Calf Veins: Below the knee amputation. Superficial Great Saphenous Vein: No evidence of thrombus. Normal compressibility. Venous Reflux:  None. Other Findings:  None. IMPRESSION: No evidence of deep venous thrombosis in either lower extremity. Electronically Signed   By: Malachy MoanHeath  McCullough M.D.   On: 12/29/2020 07:43        Scheduled Meds: . amoxicillin-clavulanate  1 tablet Oral Q12H  . atorvastatin  40 mg Oral QHS  . azelastine  1 spray Each Nare BID  . bacitracin   Topical Daily  . doxycycline  100 mg Oral Q12H  . enoxaparin (LOVENOX) injection  0.5 mg/kg Subcutaneous Q24H  . ferrous sulfate  325 mg Oral BID WC  . fluticasone furoate-vilanterol  1 puff Inhalation Daily  . gabapentin  100 mg Oral TID  . insulin aspart  0-6 Units Subcutaneous TID WC  . levothyroxine  75 mcg Oral QAC breakfast  . montelukast  10 mg Oral Daily  . multivitamin with minerals  1 tablet Oral Daily  . nutrition supplement (JUVEN)  1 packet Oral BID BM  . oxcarbazepine  600 mg Oral BID  . pantoprazole  80 mg Oral BID  . potassium chloride  40 mEq Oral Once  . Ensure Max Protein  11 oz Oral BID BM  . tamsulosin  0.4 mg Oral QPC supper  . umeclidinium bromide  1 puff Inhalation Daily  . ziprasidone  40 mg Oral BID WC   Continuous Infusions:   LOS: 12 days    Time spent: 28 minutes    Marrion Coy, MD [facility]   To contact the attending provider between 7A-7P or the covering provider during after hours 7P-7A, please log into the web site [email or web address] and access using universal [place] password for that web site. If you do not have the password, please call the hospital operator.  12/29/2020, 1:24 PM

## 2020-12-29 NOTE — Progress Notes (Signed)
[facility] Daily Progress Note  Subjective: 12/23/20: Left below-the-knee amputation  Without complaint this AM. Pain continues to be controlled. No acute issues overnight.  Objective: Vitals:   12/29/20 0500 12/29/20 0508 12/29/20 0926 12/29/20 1304  BP:  126/73 134/72 (!) 150/75  Pulse:  86 90 95  Resp:  16 16 18   Temp:  98.7 F (37.1 C) 99.1 F (37.3 C) 97.9 F (36.6 C)  TempSrc:  Oral    SpO2:  96% 97% 100%  Weight: 129.6 kg     Height:        Intake/Output Summary (Last 24 hours) at 12/29/2020 1321 Last data filed at 12/29/2020 1020 Gross per 24 hour  Intake 957 ml  Output 4200 ml  Net -3243 ml   Physical Exam: A&Ox3, NAD CV: RRR Pulmonary: CTA Bilaterally Abdomen: Soft, Non-tender, Non-distended GU: Draining clear urine Vascular:  LeftLowerExtremity:Thigh soft. Flexible at the knee joint.Dressingremoved. Oozing has stopped. Stump is perfused and warm.  Incision: Most staples are clean and dry. Approximately four staples that had migrated and were removed. Incision is still closed and intact. None infected very superficial blisters noted to the stump. The stump was treated with bacitracin and a new dressing was applied.   Laboratory: CBC    Component Value Date/Time   WBC 11.4 (H) 12/29/2020 0522   HGB 8.1 (L) 12/29/2020 0522   HGB 9.6 (L) 04/26/2014 0309   HCT 24.8 (L) 12/29/2020 0522   HCT 29.0 (L) 04/26/2014 0309   PLT 256 12/29/2020 0522   PLT 331 04/26/2014 0309   BMET    Component Value Date/Time   NA 140 12/29/2020 0522   NA 143 04/26/2014 0309   K 3.5 12/29/2020 0522   K 4.1 04/26/2014 0309   CL 104 12/29/2020 0522   CL 106 04/26/2014 0309   CO2 26 12/29/2020 0522   CO2 27 04/26/2014 0309   GLUCOSE 108 (H) 12/29/2020 0522   GLUCOSE 100 (H) 04/26/2014 0309   BUN 33 (H) 12/29/2020 0522   BUN 11 04/26/2014 0309   CREATININE 0.88 12/29/2020 0522   CREATININE 1.08 04/26/2014 0309   CALCIUM 8.5 (L) 12/29/2020 0522    CALCIUM 8.5 04/26/2014 0309   GFRNONAA >60 12/29/2020 0522   GFRNONAA >60 04/26/2014 0309   GFRAA >60 09/19/2018 1156   GFRAA >60 04/26/2014 0309   Assessment/Planning: The patient is a 60 year old male with left Charcot foot deformity with chronic osteomyelitis status post below the knee amputationPOD#6  1) Persistent leukocytosis however it is starting to trend downward. Dressing changed today with Dr. 46.  Stump seems to be perfused well. Warm to touch. Healthy. Nontender to palpation. No further drainage from incision. Scattered blisters to the stump. Flexible at the knee. Thigh soft. Hemoglobin stable. 2) Continue bacitracin daily to areas of stump with blisters. 3) Venous duplex completed yesterday. No DVT. 4) Encouraged out of bed to chair, walking/ambulation and attempt to combat any atelectasis/pneumonias.  Incentive spirometry. 5) Agree with PT/OT recommendations for dispo to SNF. 6) From a vascular standpoint, patient can be discharged when medically stable.  Seen and examined with Dr. Gilda Crease Marcelles Clinard PA-C 12/29/2020 1:21 PM

## 2020-12-30 DIAGNOSIS — L97429 Non-pressure chronic ulcer of left heel and midfoot with unspecified severity: Secondary | ICD-10-CM | POA: Diagnosis not present

## 2020-12-30 DIAGNOSIS — E11621 Type 2 diabetes mellitus with foot ulcer: Secondary | ICD-10-CM | POA: Diagnosis not present

## 2020-12-30 LAB — CBC WITH DIFFERENTIAL/PLATELET
Abs Immature Granulocytes: 0.16 10*3/uL — ABNORMAL HIGH (ref 0.00–0.07)
Basophils Absolute: 0 10*3/uL (ref 0.0–0.1)
Basophils Relative: 0 %
Eosinophils Absolute: 0.2 10*3/uL (ref 0.0–0.5)
Eosinophils Relative: 2 %
HCT: 23.2 % — ABNORMAL LOW (ref 39.0–52.0)
Hemoglobin: 7.7 g/dL — ABNORMAL LOW (ref 13.0–17.0)
Immature Granulocytes: 1 %
Lymphocytes Relative: 15 %
Lymphs Abs: 1.8 10*3/uL (ref 0.7–4.0)
MCH: 30.1 pg (ref 26.0–34.0)
MCHC: 33.2 g/dL (ref 30.0–36.0)
MCV: 90.6 fL (ref 80.0–100.0)
Monocytes Absolute: 1.1 10*3/uL — ABNORMAL HIGH (ref 0.1–1.0)
Monocytes Relative: 9 %
Neutro Abs: 8.4 10*3/uL — ABNORMAL HIGH (ref 1.7–7.7)
Neutrophils Relative %: 73 %
Platelets: 295 10*3/uL (ref 150–400)
RBC: 2.56 MIL/uL — ABNORMAL LOW (ref 4.22–5.81)
RDW: 14.5 % (ref 11.5–15.5)
WBC: 11.7 10*3/uL — ABNORMAL HIGH (ref 4.0–10.5)
nRBC: 0 % (ref 0.0–0.2)

## 2020-12-30 LAB — GLUCOSE, CAPILLARY
Glucose-Capillary: 106 mg/dL — ABNORMAL HIGH (ref 70–99)
Glucose-Capillary: 101 mg/dL — ABNORMAL HIGH (ref 70–99)

## 2020-12-30 LAB — SARS CORONAVIRUS 2 (TAT 6-24 HRS): SARS Coronavirus 2: NEGATIVE

## 2020-12-30 LAB — MRSA PCR SCREENING: MRSA by PCR: NEGATIVE

## 2020-12-30 MED ORDER — AMOXICILLIN-POT CLAVULANATE 875-125 MG PO TABS: 1.0000 | ORAL_TABLET | Freq: Two times a day (BID) | ORAL | 0 refills | Status: AC

## 2020-12-30 MED ORDER — ENSURE MAX PROTEIN PO LIQD: 11.0000 [oz_av] | Freq: Two times a day (BID) | ORAL | Status: AC

## 2020-12-30 MED ORDER — BACITRACIN ZINC 500 UNIT/GM EX OINT: TOPICAL_OINTMENT | Freq: Every day | CUTANEOUS | 0 refills | Status: AC

## 2020-12-30 MED ORDER — TAMSULOSIN HCL 0.4 MG PO CAPS: 0.4000 mg | ORAL_CAPSULE | Freq: Every day | ORAL | Status: AC

## 2020-12-30 MED ORDER — GABAPENTIN 100 MG PO CAPS: 100.0000 mg | ORAL_CAPSULE | Freq: Three times a day (TID) | ORAL | Status: AC

## 2020-12-30 MED ORDER — OXYCODONE-ACETAMINOPHEN 5-325 MG PO TABS: 1.0000 | ORAL_TABLET | ORAL | 0 refills | Status: AC | PRN

## 2020-12-30 MED ORDER — DOXYCYCLINE HYCLATE 100 MG PO TABS: 100.0000 mg | ORAL_TABLET | Freq: Two times a day (BID) | ORAL | 0 refills | Status: AC

## 2020-12-30 NOTE — Discharge Instructions (Signed)
Vascular Surgery Discharge Instructions:  1) You may shower.  Please do not bathe or submerge in water.  Keep your incision clean and dry. 2) Daily dressing changes: Xeroform to incision line, ABDs, followed by Kerlix, followed by ACE. This can be done daily or sooner if the dressing has drainage. 3) Please do not engage in any strenuous activity or lifting greater than 10 pounds until you are cleared at your first postoperative visit

## 2020-12-30 NOTE — TOC Transition Note (Signed)
Transition of Care [facility]) - CM/SW Discharge Note   Patient Details  Name: Adrian [name]. MRN: [id number] Date of Birth: Sep 16, 1961  [facility] CM/SW Contact:  Allayne Butcher, RN Phone Number: 12/30/2020, 10:49 AM   Clinical Narrative:    Patient is medically cleared for discharge to [facility] today for short term rehab.  Patient is going to room 604A.  Bedside RN has called report and RNCM has arranged EMS transport.    Final next level of care: Skilled Nursing Facility Barriers to Discharge: Barriers Resolved   Patient Goals and CMS Choice Patient states their goals for this hospitalization and ongoing recovery are:: Patient wants to have a successful surgery and go to rehab and be able to take care of himself CMS Medicare.gov Compare Post Acute Care list provided to:: Patient Choice offered to / list presented to : Patient  Discharge Placement PASRR number recieved: 12/25/20            Patient chooses bed at: [facility] Patient to be transferred to facility by:  EMS Name of family member notified: Patient notifed family and friends Patient and family notified of of transfer: 12/30/20  Discharge Plan and Services   Discharge Planning Services: CM Consult Post Acute Care Choice: Skilled Nursing Facility          DME Arranged: N/A DME Agency: NA       HH Arranged: NA          Social Determinants of Health (SDOH) Interventions     Readmission Risk Interventions No flowsheet data found.

## 2020-12-30 NOTE — Plan of Care (Signed)
End of Shift Summary:  Alert and oriented x4. VSS. Afebrile. Remained on room air, sats >94%. Denies pain or n/v. Blood glucose monitoring. Dressing remained intact to LLE, no drainage present. Remained free from falls or injury. Call bell within reach and able to use.

## 2020-12-30 NOTE — Discharge Summary (Signed)
Physician Discharge Summary  Adrian [name]. MVE:[id number] DOB: 10/11/1961 DOA: 12/17/2020  PCP: [facility]  Admit date: 12/17/2020 Discharge date: 12/30/2020   Disposition:  SNF Rehab   Recommendations for Outpatient Follow-up:  1. Follow up with Vascular surgery, Dr. Marijean Heath office WITHIN 2 weeks 2. Obtain repeat CBC to monitor Hgb in 3 weeks 3. Follow up with Urology for urinary retention in 4-6 weeks     Home Health: N/A  Equipment/Devices: TBD at SNF  Discharge Condition: Fair  CODE STATUS: FULL Diet recommendation: Diabetic, cardiac  Brief/Interim Summary: Adrian [name] is a 60 y.o. M with hx DM, MO, HTN, OSA, OHS, Bipolar, and osteomyelitis who presented with worsening redness, swelling and pain of the left foot.  MRI confirmed osteo of the cuboid and base of 4th metatarsal.  Orthopedics and vascular surgery were consulted and recommended BKA.       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  3/9 BKA performed without complication.  3/12 post-op fever, restarted on doxycycline and amoxicillin for 7 days    Post-operative fever Developed some post-op fever.  Was started on Augmentin, doxycycline and topical bacitracin.  Fever resolved. -Continue doxycycline and Augmentin for 7 more days  Acute blood loss anemia on chronic iron deficiency anemia B12 deficiency Patient had some expected post-operative bleeding.  B12 was supplemented.  He was given IV iron.  Hgb subsequently stable.  Urinary retention Patient had post-op urinary retention, foley placed.  Flomax started.    Foley removed and patient completed voiding trial successfully.  -Continue Flomax, follow up with Urology    Type 2 diabetes Glucoses well controlled here off  insulin       Discharge Instructions  Discharge Instructions    Diet - low sodium heart healthy   Complete by: As directed    Discharge instructions   Complete by: As directed    Take antibiotics for 7 more days  Take doxycycline 100 mg twice daily and Augmenting 875-125 mg twice daily for 7 days  Go see Dr. Marijean Heath office in 1-2 weeks, see [facility], info below in To Do Section  Stop lisionopril and metoprolol blood pressure medicines for now Resume them only if told by your doctors at rehab or your PCP  Take Ensure protein supplement  Start new Flomax and gabapentin   Discharge wound care:   Complete by: As directed    Remove dressing at least once daily Apply bacitracin to blisters once daily Re-apply simple bandage covered by ace wrap to stump once daily   Increase activity slowly   Complete by: As directed      Allergies as of 12/30/2020   No Known Allergies     Medication List    STOP taking these medications   lisinopril 10 MG tablet Commonly known as: ZESTRIL   lisinopril 2.5 MG tablet Commonly known as: ZESTRIL   metoprolol tartrate 100 MG tablet Commonly known as: LOPRESSOR     TAKE these medications   acetaminophen 325 MG tablet Commonly known as: TYLENOL Take 650 mg by mouth every 4 (four) hours as needed for mild pain or fever.   albuterol 108 (90 Base) MCG/ACT inhaler Commonly known as: VENTOLIN HFA Inhale 2 puffs into the lungs every 6 (six) hours as needed for wheezing or shortness of breath.   alum & mag  hydroxide-simeth 200-200-20 MG/5ML suspension Commonly known as: MAALOX/MYLANTA Take 30 mLs by mouth as needed for indigestion or heartburn.   amoxicillin-clavulanate 875-125 MG tablet Commonly known as: AUGMENTIN Take 1 tablet by mouth every 12 (twelve) hours.   atorvastatin 40 MG tablet Commonly known as: LIPITOR Take 40 mg by mouth at bedtime.   Azelastine HCl 137 MCG/SPRAY Soln Place 1 spray into both nostrils 2 (two)  times daily. (0800 & 2000) Use in each nostril as directed   bacitracin ointment Apply topically daily. Start taking on: December 31, 2020   bismuth subsalicylate 262 MG/15ML suspension Commonly known as: PEPTO BISMOL Take 30 mLs by mouth every 6 (six) hours as needed for indigestion.   cholecalciferol 1000 units tablet Commonly known as: VITAMIN D Take 1,000 Units by mouth daily. (0800)   doxycycline 100 MG tablet Commonly known as: VIBRA-TABS Take 1 tablet (100 mg total) by mouth every 12 (twelve) hours.   Ensure Max Protein Liqd Take 330 mLs (11 oz total) by mouth 2 (two) times daily between meals.   ferrous sulfate 325 (65 FE) MG tablet Take 325 mg by mouth 2 (two) times daily with a meal. (0800 & 2000)   fluticasone furoate-vilanterol 100-25 MCG/INH Aepb Commonly known as: BREO ELLIPTA Inhale 1 puff into the lungs daily. (0800)   gabapentin 100 MG capsule Commonly known as: NEURONTIN Take 1 capsule (100 mg total) by mouth 3 (three) times daily.   levothyroxine 75 MCG tablet Commonly known as: SYNTHROID Take 75 mcg by mouth daily before breakfast. (0700)   loperamide 2 MG tablet Commonly known as: IMODIUM A-D Take 2-4 mg by mouth 4 (four) times daily as needed for diarrhea or loose stools.   magnesium hydroxide 400 MG/5ML suspension Commonly known as: MILK OF MAGNESIA Take 15-30 mLs by mouth daily as needed for mild constipation.   montelukast 10 MG tablet Commonly known as: SINGULAIR Take 10 mg by mouth daily. (0800)   nystatin powder Commonly known as: MYCOSTATIN/NYSTOP Apply 1 g topically 2 (two) times daily as needed (FOR FUNGAL RASH). Under abdomen/groin   oxcarbazepine 600 MG tablet Commonly known as: TRILEPTAL Take 600 mg by mouth 2 (two) times daily. (0800 & 2000)   oxyCODONE-acetaminophen 5-325 MG tablet Commonly known as: PERCOCET/ROXICET Take 1-2 tablets by mouth every 4 (four) hours as needed for moderate pain or severe pain.   pantoprazole 40  MG tablet Commonly known as: PROTONIX Take 1 tablet (40 mg total) by mouth daily at 12 noon.   senna 8.6 MG tablet Commonly known as: SENOKOT Take 1 tablet by mouth at bedtime as needed for constipation.   tamsulosin 0.4 MG Caps capsule Commonly known as: FLOMAX Take 1 capsule (0.4 mg total) by mouth daily after supper.   THEREMS PO Take 1 tablet by mouth daily. (0800)   Tiotropium Bromide Monohydrate 2.5 MCG/ACT Aers Inhale 1 puff into the lungs daily. (0800)   triamcinolone 0.1 % Commonly known as: KENALOG Apply 1 application topically 2 (two) times daily as needed (until rash clears). (0800 & 2000)   ziprasidone 40 MG capsule Commonly known as: GEODON Take 40 mg by mouth 2 (two) times daily with a meal. (0800 & 2000)   zolpidem 5 MG tablet Commonly known as: AMBIEN Take 5 mg by mouth at bedtime.            Discharge Care Instructions  (From admission, onward)         Start     Ordered   12/30/20  0000  Discharge wound care:       Comments: Remove dressing at least once daily Apply bacitracin to blisters once daily Re-apply simple bandage covered by ace wrap to stump once daily   12/30/20 1017          Contact information for follow-up providers    Georgiana Spinner, NP Follow up in 2 week(s).   Specialty: Vascular Surgery Why: First post-op visit. No studies needed. Contact information: [address] [place] [address] [phone]            Contact information for after-discharge care    Destination    [facility] Preferred SNF .   Service: Skilled Nursing Contact information: [address] [place] [address] [phone]                 No Known Allergies  Consultations:  Vascular surgery   Procedures/Studies: MR FOOT LEFT W WO CONTRAST  Result Date: 12/17/2020 CLINICAL DATA:  For osteomyelitis for for for EXAM: MRI OF THE LEFT FOREFOOT WITHOUT AND WITH CONTRAST TECHNIQUE: Multiplanar,  multisequence MR imaging of the left was performed both before and after administration of intravenous contrast. CONTRAST:  10mL GADAVIST GADOBUTROL 1 MMOL/ML IV SOLN COMPARISON:  December 10, 2018 FINDINGS: Bones/Joint/Cartilage There is a area of ulceration seen overlying the plantar lateral aspect of the midfoot measuring 1.3 cm in length with underlying significant edema and possible sinus tract to the cuboid. There is chronic cortical irregularity of the cuboid. However there is increased marrow signal with subtle T1 hypointensity seen at the inferior surface of the cuboid. No definite loculated fluid collections are seen. 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 No large joint effusion is noted. There is chronic partial resection at the base of the fifth metatarsal Ligaments Suboptimally visualized Muscles and Tendons Diffusely increased signal with fatty atrophy of the muscles is noted. The visualized portion of the tendons appear to be intact. Soft tissues As described above area of ulceration on the plantar lateral aspect of the midfoot. There is heel pad inflammation is subcutaneous edema. IMPRESSION: Area of ulceration along the plantar lateral aspect of the midfoot with a possible sinus tract to the underlying cuboid. There is findings of acute on chronic osteomyelitis involving the cuboid. No definite loculated fluid collections. 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 Electronically Signed   By: Jonna Clark M.D.   On: 12/17/2020 22:49   US Venous Img Lower Bilateral (DVT)  Result Date: 12/29/2020 CLINICAL DATA:  60 year old male with bilateral lower extremity pain. History of recent left below the knee amputation. EXAM: BILATERAL LOWER EXTREMITY VENOUS DOPPLER ULTRASOUND TECHNIQUE: Gray-scale sonography with graded compression, as well as color Doppler and duplex ultrasound were performed to evaluate the lower extremity deep venous systems from the level of  the common femoral vein and including the common femoral, femoral, profunda femoral, popliteal and calf veins including the posterior tibial, peroneal and gastrocnemius veins when visible. The superficial great saphenous vein was also interrogated. Spectral Doppler was utilized to evaluate flow at rest and with distal augmentation maneuvers in the common femoral, femoral and popliteal veins. COMPARISON:  None. FINDINGS: RIGHT LOWER EXTREMITY Common Femoral Vein: No evidence of thrombus. Normal compressibility, respiratory phasicity and response to augmentation. Saphenofemoral Junction: No evidence of thrombus. Normal compressibility and flow on color Doppler imaging. Profunda Femoral Vein: No evidence of thrombus. Normal compressibility and flow on color Doppler imaging. Femoral Vein: No evidence of  thrombus. Normal compressibility, respiratory phasicity and response to augmentation. Popliteal Vein: No evidence of thrombus. Normal compressibility, respiratory phasicity and response to augmentation. Calf Veins: No evidence of thrombus. Normal compressibility and flow on color Doppler imaging. Superficial Great Saphenous Vein: No evidence of thrombus. Normal compressibility. Venous Reflux:  None. Other Findings:  None. LEFT LOWER EXTREMITY Common Femoral Vein: No evidence of thrombus. Normal compressibility, respiratory phasicity and response to augmentation. Saphenofemoral Junction: No evidence of thrombus. Normal compressibility and flow on color Doppler imaging. Profunda Femoral Vein: No evidence of thrombus. Normal compressibility and flow on color Doppler imaging. Femoral Vein: No evidence of thrombus. Normal compressibility, respiratory phasicity and response to augmentation. Popliteal Vein: No evidence of thrombus. Normal compressibility, respiratory phasicity and response to augmentation. Calf Veins: Below the knee amputation. Superficial Great Saphenous Vein: No evidence of thrombus. Normal compressibility.  Venous Reflux:  None. Other Findings:  None. IMPRESSION: No evidence of deep venous thrombosis in either lower extremity. Electronically Signed   By: Malachy Moan M.D.   On: 12/29/2020 07:43      Subjective: Feeling well.  No pain or fever.  No vomiting.  No confusion.  No chest pain or dyspnea.  Discharge Exam: Vitals:   12/30/20 0357 12/30/20 0739  BP: 132/70 130/72  Pulse: 85 90  Resp: 19 16  Temp: 98.7 F (37.1 C) 99.9 F (37.7 C)  SpO2: 96% 95%   Vitals:   12/29/20 2345 12/30/20 0357 12/30/20 0500 12/30/20 0739  BP: (!) 147/74 132/70  130/72  Pulse: 85 85  90  Resp: 20 19  16   Temp: 99.4 F (37.4 C) 98.7 F (37.1 C)  99.9 F (37.7 C)  TempSrc: Oral Oral  Oral  SpO2: 98% 96%  95%  Weight:   128.9 kg   Height:        General: Pt is alert, awake, not in acute distress Cardiovascular: RRR, nl S1-S2, no murmurs appreciated.   No LE edema.   Respiratory: Normal respiratory rate and rhythm.  CTAB without rales or wheezes. Abdominal: Abdomen soft and non-tender.  No distension or HSM.   MSK: Left BKA stump not painful Neuro/Psych: Strength symmetric in upper and lower extremities.  Judgment and insight appear normal.       The results of significant diagnostics from this hospitalization (including imaging, microbiology, ancillary and laboratory) are listed below for reference.     Microbiology: Recent Results (from the past 240 hour(s))  Urine Culture     Status: None   Collection Time: 12/26/20  7:00 AM   Specimen: Urine, Random  Result Value Ref Range Status   Specimen Description   Final    URINE, RANDOM Performed at [facility], [address] [place], [place]    Special Requests   Final    NONE Performed at [facility], [address], [place], [place]    Culture   Final    NO GROWTH Performed at [facility], [address] [place], [place]    Report Status 12/27/2020 FINAL  Final   CULTURE, BLOOD (ROUTINE X 2) w Reflex to ID Panel     Status: None (Preliminary result)   Collection Time: 12/28/20  9:57 AM   Specimen: BLOOD  Result Value Ref Range Status   Specimen Description BLOOD  LEFT HAND   Final   Special Requests   Final    BOTTLES DRAWN AEROBIC AND ANAEROBIC Blood Culture results may not be  optimal due to an excessive volume of blood received in culture bottles   Culture   Final    NO GROWTH 2 DAYS Performed at [facility], [address] [place], [place] [address]    Report Status PENDING  Incomplete  CULTURE, BLOOD (ROUTINE X 2) w Reflex to ID Panel     Status: None (Preliminary result)   Collection Time: 12/28/20 10:02 AM   Specimen: BLOOD  Result Value Ref Range Status   Specimen Description BLOOD LEFT HAND  Final   Special Requests   Final    BOTTLES DRAWN AEROBIC AND ANAEROBIC Blood Culture adequate volume   Culture   Final    NO GROWTH 2 DAYS Performed at [facility], [address] [place], [place] [address]    Report Status PENDING  Incomplete  SARS CORONAVIRUS 2 (TAT 6-24 HRS) Nasopharyngeal Nasopharyngeal Swab     Status: None   Collection Time: 12/29/20  2:26 PM   Specimen: Nasopharyngeal Swab  Result Value Ref Range Status   SARS Coronavirus 2 NEGATIVE NEGATIVE Final    Comment: (NOTE) SARS-CoV-2 target nucleic acids are NOT DETECTED.  The SARS-CoV-2 RNA is generally detectable in upper and lower respiratory specimens during the acute phase of infection. Negative results do not preclude SARS-CoV-2 infection, do not rule out co-infections with other pathogens, and should not be used as the sole basis for treatment or other patient management decisions. Negative results must be combined with clinical observations, patient history, and epidemiological information. The expected result is Negative.  Fact Sheet for Patients: HairSlick.no  Fact Sheet for Healthcare  Providers: quierodirigir.com  This test is not yet approved or cleared by the [place] FDA and  has been authorized for detection and/or diagnosis of SARS-CoV-2 by FDA under an Emergency Use Authorization (EUA). This EUA will remain  in effect (meaning this test can be used) for the duration of the COVID-19 declaration under Se ction 564(b)(1) of the Act, 21 U.S.C. section 360bbb-3(b)(1), unless the authorization is terminated or revoked sooner.  Performed at [facility], [address], [place], [place] [address]      Labs: BNP (last 3 results) No results for input(s): BNP in the last 8760 hours. Basic Metabolic Panel: Recent Labs  Lab 12/24/20 0555 12/25/20 0439 12/27/20 0500 12/29/20 0522  NA 137 135 138 140  K 4.3 3.9 3.9 3.5  CL 104 102 104 104  CO2 GLUCOSE 117* 114* 107* 108*  BUN 32* 38* 31* 33*  CREATININE 1.05 1.15 0.98 0.88  CALCIUM 7.9* 7.4* 8.3* 8.5*  MG 1.8 1.9 2.2 2.0   Liver Function Tests: No results for input(s): AST, ALT, ALKPHOS, BILITOT, PROT, ALBUMIN in the last 168 hours. No results for input(s): LIPASE, AMYLASE in the last 168 hours. No results for input(s): AMMONIA in the last 168 hours. CBC: Recent Labs  Lab 12/26/20 0447 12/27/20 0500 12/28/20 0501 12/29/20 0522 12/30/20 0611  WBC 11.4* 12.9* 11.6* 11.4* 11.7*  NEUTROABS 8.1* 8.7* 8.3* 8.4* 8.4*  HGB 7.1* 8.7* 7.9* 8.1* 7.7*  HCT 21.6* 26.5* 23.8* 24.8* 23.2*  MCV 89.6 90.4 90.2 91.5 90.6  PLT 182 207 223 256 295   Cardiac Enzymes: No results for input(s): CKTOTAL, CKMB, CKMBINDEX, TROPONINI in the last 168 hours. BNP: Invalid input(s): POCBNP CBG: Recent Labs  Lab 12/29/20 0923 12/29/20 1207 12/29/20 1544 12/29/20 2230 12/30/20 0738  GLUCAP 133* 125* 134* 165* 106*   D-Dimer No results  for input(s): DDIMER in the last 72 hours. Hgb A1c No results for input(s): HGBA1C in the last 72 hours. Lipid Profile No results for  input(s): CHOL, HDL, LDLCALC, TRIG, CHOLHDL, LDLDIRECT in the last 72 hours. Thyroid function studies No results for input(s): TSH, T4TOTAL, T3FREE, THYROIDAB in the last 72 hours.  Invalid input(s): FREET3 Anemia work up No results for input(s): VITAMINB12, FOLATE, FERRITIN, TIBC, IRON, RETICCTPCT in the last 72 hours. Urinalysis    Component Value Date/Time   COLORURINE YELLOW (A) 12/26/2020 0700   APPEARANCEUR CLEAR (A) 12/26/2020 0700   APPEARANCEUR Clear 04/25/2014 1246   LABSPEC 1.012 12/26/2020 0700   LABSPEC 1.004 04/25/2014 1246   PHURINE 5.0 12/26/2020 0700   GLUCOSEU NEGATIVE 12/26/2020 0700   GLUCOSEU Negative 04/25/2014 1246   HGBUR MODERATE (A) 12/26/2020 0700   BILIRUBINUR NEGATIVE 12/26/2020 0700   BILIRUBINUR Negative 04/25/2014 1246   KETONESUR NEGATIVE 12/26/2020 0700   PROTEINUR NEGATIVE 12/26/2020 0700   UROBILINOGEN 0.2 01/16/2014 0207   NITRITE NEGATIVE 12/26/2020 0700   LEUKOCYTESUR NEGATIVE 12/26/2020 0700   LEUKOCYTESUR Trace 04/25/2014 1246   Sepsis Labs Invalid input(s): PROCALCITONIN,  WBC,  LACTICIDVEN Microbiology Recent Results (from the past 240 hour(s))  Urine Culture     Status: None   Collection Time: 12/26/20  7:00 AM   Specimen: Urine, Random  Result Value Ref Range Status   Specimen Description   Final    URINE, RANDOM Performed at [facility], [address] [place], [place] [address]    Special Requests   Final    NONE Performed at [facility], [address] [place], [place] [address]    Culture   Final    NO GROWTH Performed at [facility], [address], [place], [place] [address]    Report Status 12/27/2020 FINAL  Final  CULTURE, BLOOD (ROUTINE X 2) w Reflex to ID Panel     Status: None (Preliminary result)   Collection Time: 12/28/20  9:57 AM   Specimen: BLOOD  Result Value Ref Range Status   Specimen Description BLOOD  LEFT HAND   Final   Special Requests   Final    BOTTLES DRAWN  AEROBIC AND ANAEROBIC Blood Culture results may not be optimal due to an excessive volume of blood received in culture bottles   Culture   Final    NO GROWTH 2 DAYS Performed at [facility], [address] [place], [place] [address]    Report Status PENDING  Incomplete  CULTURE, BLOOD (ROUTINE X 2) w Reflex to ID Panel     Status: None (Preliminary result)   Collection Time: 12/28/20 10:02 AM   Specimen: BLOOD  Result Value Ref Range Status   Specimen Description BLOOD LEFT HAND  Final   Special Requests   Final    BOTTLES DRAWN AEROBIC AND ANAEROBIC Blood Culture adequate volume   Culture   Final    NO GROWTH 2 DAYS Performed at [facility], [address], [place], [place] [address]    Report Status PENDING  Incomplete  SARS CORONAVIRUS 2 (TAT 6-24 HRS) Nasopharyngeal Nasopharyngeal Swab     Status: None   Collection Time: 12/29/20  2:26 PM   Specimen: Nasopharyngeal Swab  Result Value Ref Range Status   SARS Coronavirus 2 NEGATIVE NEGATIVE Final    Comment: (NOTE) SARS-CoV-2 target nucleic acids are NOT DETECTED.  The SARS-CoV-2 RNA is generally detectable in upper and lower respiratory specimens during the acute  phase of infection. Negative results do not preclude SARS-CoV-2 infection, do not rule out co-infections with other pathogens, and should not be used as the sole basis for treatment or other patient management decisions. Negative results must be combined with clinical observations, patient history, and epidemiological information. The expected result is Negative.  Fact Sheet for Patients: HairSlick.nohttps://www.fda.gov/media/138098/download  Fact Sheet for Healthcare Providers: quierodirigir.comhttps://www.fda.gov/media/138095/download  This test is not yet approved or cleared by the [place] FDA and  has been authorized for detection and/or diagnosis of SARS-CoV-2 by FDA under an Emergency Use Authorization (EUA). This EUA will remain  in effect (meaning this  test can be used) for the duration of the COVID-19 declaration under Se ction 564(b)(1) of the Act, 21 U.S.C. section 360bbb-3(b)(1), unless the authorization is terminated or revoked sooner.  Performed at [facility], [address] [place], [place] [address]      Time coordinating discharge: 25 minutes The [place] controlled substances registry was reviewed for this patient prior to filling the <5 days supply controlled substances script.      SIGNED:   Alberteen Samhristopher P Danford, MD  [facility] 12/30/2020, 10:30 AM

## 2020-12-30 NOTE — Care Management Important Message (Signed)
Important Message  Patient Details  Name: Adrian [name]. MRN: [id number] Date of Birth: 13-Sep-1961   Medicare Important Message Given:  Yes     Olegario Messier A Nivia Gervase 12/30/2020, 9:10 AM

## 2021-01-02 LAB — CULTURE, BLOOD (ROUTINE X 2)
Culture: NO GROWTH
Culture: NO GROWTH
Special Requests: ADEQUATE

## 2021-01-05 ENCOUNTER — Emergency Department
Admission: EM | Admit: 2021-01-05 | Discharge: 2021-01-05 | Disposition: A | Discharge status: Home / Self Care | Payer: Medicare Other | Attending: Emergency Medicine | Admitting: Emergency Medicine

## 2021-01-05 ENCOUNTER — Other Ambulatory Visit: Payer: Self-pay

## 2021-01-05 DIAGNOSIS — Z7951 Long term (current) use of inhaled steroids: Secondary | ICD-10-CM | POA: Insufficient documentation

## 2021-01-05 DIAGNOSIS — Z79899 Other long term (current) drug therapy: Secondary | ICD-10-CM | POA: Diagnosis not present

## 2021-01-05 DIAGNOSIS — I10 Essential (primary) hypertension: Secondary | ICD-10-CM | POA: Diagnosis not present

## 2021-01-05 DIAGNOSIS — Z87891 Personal history of nicotine dependence: Secondary | ICD-10-CM | POA: Diagnosis not present

## 2021-01-05 DIAGNOSIS — E119 Type 2 diabetes mellitus without complications: Secondary | ICD-10-CM | POA: Diagnosis not present

## 2021-01-05 DIAGNOSIS — J45909 Unspecified asthma, uncomplicated: Secondary | ICD-10-CM | POA: Diagnosis not present

## 2021-01-05 DIAGNOSIS — Z89512 Acquired absence of left leg below knee: Secondary | ICD-10-CM | POA: Diagnosis not present

## 2021-01-05 DIAGNOSIS — E1140 Type 2 diabetes mellitus with diabetic neuropathy, unspecified: Secondary | ICD-10-CM | POA: Insufficient documentation

## 2021-01-05 DIAGNOSIS — T8781 Dehiscence of amputation stump: Secondary | ICD-10-CM | POA: Insufficient documentation

## 2021-01-05 DIAGNOSIS — E039 Hypothyroidism, unspecified: Secondary | ICD-10-CM | POA: Insufficient documentation

## 2021-01-05 DIAGNOSIS — T8130XA Disruption of wound, unspecified, initial encounter: Secondary | ICD-10-CM

## 2021-01-05 NOTE — Discharge Instructions (Addendum)
Please seek medical attention for any high fevers, chest pain, shortness of breath, change in behavior, persistent vomiting, bloody stool or any other new or concerning symptoms.  

## 2021-01-05 NOTE — ED Notes (Signed)
Wound redressed by this RN. No bleeding noted at this time. Pt able to move extremity.

## 2021-01-05 NOTE — ED Triage Notes (Signed)
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 Pt stable on arrival to ED.

## 2021-01-05 NOTE — ED Provider Notes (Signed)
[facility] Emergency Department Provider Note  ____________________________________________   I have reviewed the triage vital signs and the nursing notes.   HISTORY  Chief Complaint Wound Check   History limited by: Not Limited   HPI Adrian Esselman. is a 60 y.o. male who presents to the emergency department today because of concern for wound dehiscence. The patient is coming from [facility]. Had left BKA performed 13 days ago. States that he was told they were worried that some of the staples had come out. The patient states that he had some pain a couple of days ago to that area but has not had any pain since. Denies any fevers, nausea.    Records reviewed. Per medical record review patient has a history of recent left bka 13 days ago secondary to osteomyelitis.   Past Medical History:  Diagnosis Date  . Anemia   . Asthma   . Bipolar disorder (HCC)   . Charcot foot due to diabetes mellitus (HCC)   . Depression   . Dermatitis seborrheica   . Diabetes mellitus without complication (HCC)   . Eczema   . Hyperlipemia   . Hypertension   . Hypothyroidism   . Neuropathy   . Psoriasis   . Psychosis (HCC)   . Respiratory failure (HCC)   . Sleep apnea, obstructive   . Stroke [facility])    left hand weakness    Patient Active Problem List   Diagnosis Date Noted  . Acute urinary retention 12/26/2020  . Acute blood loss anemia 12/25/2020  . DM2 (diabetes mellitus, type 2) (HCC) 12/18/2020  . Diabetic foot ulcer (HCC) 12/17/2020  . Diabetic foot infection (HCC) 09/25/2017  . Foot pain 09/24/2017  . Osteomyelitis of left foot (HCC) 09/23/2017  . Left hand weakness 07/05/2016  . Numbness and tingling 07/05/2016  . Hypernatremia   . Candidal pneumonia (HCC)   . Abdominal wall cellulitis   . Bacteremia due to Streptococcus   . Dysphagia   . OSA (obstructive sleep apnea)   . Obesity hypoventilation syndrome (HCC)   . Bipolar I disorder (HCC)    . Other specified hypothyroidism   . Tracheostomy care (HCC)   . C. difficile colitis   . Acute respiratory failure with hypoxia (HCC)   . Encounter for imaging study to confirm nasogastric (NG) tube placement   . History of ETT   . Acute respiratory failure (HCC)   . ARDS (adult respiratory distress syndrome) (HCC)   . Acute renal failure (HCC)   . Respiratory failure (HCC) 10/04/2015  . Sepsis (HCC) 10/02/2015  . Acute kidney injury (HCC) 09/23/2014  . Allergic rhinitis, cause unspecified 02/24/2014  . Unspecified constipation 02/24/2014  . Essential hypertension, benign 02/24/2014  . UTI (lower urinary tract infection) 01/16/2014  . Altered mental status 01/16/2014  . Diabetic osteomyelitis (HCC) 01/03/2014  . Diabetic neuropathy (HCC) 01/03/2014  . Obstructive sleep apnea 01/03/2014  . Asthma, chronic 01/03/2014  . Seborrheic dermatitis of scalp 01/03/2014  . Morbid obesity (HCC) 01/03/2014  . HTN (hypertension) 10/05/2013  . Hyperlipidemia, unspecified 10/05/2013  . Osteomyelitis (HCC) 10/05/2013    Past Surgical History:  Procedure Laterality Date  . AMPUTATION Left 12/23/2020   Procedure: AMPUTATION BELOW KNEE;  Surgeon: Renford Dills, MD;  Location: [facility] ORS;  Service: Vascular;  Laterality: Left;  . BONE EXCISION Left 09/21/2018   Procedure: Partial excision of metatarsal bone;  Surgeon: Gwyneth Revels, DPM;  Location: [facility] ORS;  Service: Podiatry;  Laterality:  Left;  . BONE EXOSTOSIS EXCISION Left 05/11/2018   Procedure: Excisional Debridment ulcer left foot;  Surgeon: Gwyneth Revels, DPM;  Location: [facility] ORS;  Service: Podiatry;  Laterality: Left;  . bone removed     bone removed in foot due to infection   . TONSILLECTOMY      Prior to Admission medications   Medication Sig Start Date End Date Taking? Authorizing Provider  acetaminophen (TYLENOL) 325 MG tablet Take 650 mg by mouth every 4 (four) hours as needed for mild pain or fever.     [provider]  albuterol (PROVENTIL HFA;VENTOLIN HFA) 108 (90 Base) MCG/ACT inhaler Inhale 2 puffs into the lungs every 6 (six) hours as needed for wheezing or shortness of breath.    [provider]  alum & mag hydroxide-simeth (MAALOX/MYLANTA) 200-200-20 MG/5ML suspension Take 30 mLs by mouth as needed for indigestion or heartburn.     [provider]  amoxicillin-clavulanate (AUGMENTIN) 875-125 MG tablet Take 1 tablet by mouth every 12 (twelve) hours. 12/30/20   Danford, Earl Lites, MD  atorvastatin (LIPITOR) 40 MG tablet Take 40 mg by mouth at bedtime.     [provider]  Azelastine HCl 137 MCG/SPRAY SOLN Place 1 spray into both nostrils 2 (two) times daily. (0800 & 2000) Use in each nostril as directed    [provider]  bacitracin ointment Apply topically daily. 12/31/20   Danford, Earl Lites, MD  bismuth subsalicylate (PEPTO BISMOL) 262 MG/15ML suspension Take 30 mLs by mouth every 6 (six) hours as needed for indigestion.     [provider]  cholecalciferol (VITAMIN D) 1000 units tablet Take 1,000 Units by mouth daily. (0800)    [provider]  doxycycline (VIBRA-TABS) 100 MG tablet Take 1 tablet (100 mg total) by mouth every 12 (twelve) hours. 12/30/20   Danford, Earl Lites, MD  Ensure Max Protein (ENSURE MAX PROTEIN) LIQD Take 330 mLs (11 oz total) by mouth 2 (two) times daily between meals. 12/30/20   Danford, Earl Lites, MD  ferrous sulfate 325 (65 FE) MG tablet Take 325 mg by mouth 2 (two) times daily with a meal. (0800 & 2000)    [provider]  fluticasone furoate-vilanterol (BREO ELLIPTA) 100-25 MCG/INH AEPB Inhale 1 puff into the lungs daily. (0800)    [provider]  gabapentin (NEURONTIN) 100 MG capsule Take 1 capsule (100 mg total) by mouth 3 (three) times daily. 12/30/20   Danford, Earl Lites, MD  levothyroxine (SYNTHROID, LEVOTHROID) 75 MCG tablet Take 75 mcg by mouth daily before breakfast. (0700)     [provider]  loperamide (IMODIUM A-D) 2 MG tablet Take 2-4 mg by mouth 4 (four) times daily as needed for diarrhea or loose stools.     [provider]  magnesium hydroxide (MILK OF MAGNESIA) 400 MG/5ML suspension Take 15-30 mLs by mouth daily as needed for mild constipation.     [provider]  montelukast (SINGULAIR) 10 MG tablet Take 10 mg by mouth daily. (0800)    [provider]  Multiple Vitamin (THEREMS PO) Take 1 tablet by mouth daily. (0800)    [provider]  nystatin (MYCOSTATIN/NYSTOP) powder Apply 1 g topically 2 (two) times daily as needed (FOR FUNGAL RASH). Under abdomen/groin    [provider]  oxcarbazepine (TRILEPTAL) 600 MG tablet Take 600 mg by mouth 2 (two) times daily. (0800 & 2000)    [provider]  oxyCODONE-acetaminophen (PERCOCET/ROXICET) 5-325 MG tablet Take 1-2 tablets  by mouth every 4 (four) hours as needed for moderate pain or severe pain. 12/30/20   Danford, Earl Lites, MD  pantoprazole (PROTONIX) 40 MG tablet Take 1 tablet (40 mg total) by mouth daily at 12 noon. 10/20/15   Drema Dallas, MD  senna (SENOKOT) 8.6 MG tablet Take 1 tablet by mouth at bedtime as needed for constipation.    [provider]  tamsulosin (FLOMAX) 0.4 MG CAPS capsule Take 1 capsule (0.4 mg total) by mouth daily after supper. 12/30/20   Danford, Earl Lites, MD  Tiotropium Bromide Monohydrate 2.5 MCG/ACT AERS Inhale 1 puff into the lungs daily. (0800)    [provider]  triamcinolone cream (KENALOG) 0.1 % Apply 1 application topically 2 (two) times daily as needed (until rash clears). (0800 & 2000)    [provider]  ziprasidone (GEODON) 40 MG capsule Take 40 mg by mouth 2 (two) times daily with a meal. (0800 & 2000) 09/07/15   [provider]  zolpidem (AMBIEN) 5 MG tablet Take 5 mg by mouth at bedtime.    [provider]    Allergies Patient has no known  allergies.  Family History  Problem Relation Age of Onset  . Heart failure Mother   . Stroke Father     Social History Social History   Tobacco Use  . Smoking status: Former Smoker    Packs/day: 0.25    Quit date: 03/03/2018    Years since quitting: 2.8  . Smokeless tobacco: Never Used  Vaping Use  . Vaping Use: Never used  Substance Use Topics  . Alcohol use: No  . Drug use: No    Review of Systems Constitutional: No fever/chills Eyes: No visual changes. ENT: No sore throat. Cardiovascular: Denies chest pain. Respiratory: Denies shortness of breath. Gastrointestinal: No abdominal pain.  No nausea, no vomiting.  No diarrhea.   Genitourinary: Negative for dysuria. Musculoskeletal: Negative for back pain. Skin: Negative for rash. Neurological: Negative for headaches, focal weakness or numbness.  ____________________________________________   PHYSICAL EXAM:  VITAL SIGNS: ED Triage Vitals  Enc Vitals Group     BP 01/05/21 1456 (!) 123/57     Pulse --      Resp 01/05/21 1456 18     Temp 01/05/21 1456 98.1 F (36.7 C)     Temp Source 01/05/21 1456 Oral     SpO2 01/05/21 1456 94 %     Weight 01/05/21 1457 284 lb 2.8 oz (128.9 kg)     Height 01/05/21 1457 6\' 3"  (1.905 m)     Head Circumference --      [facility] --      Pain Score 01/05/21 1456 0   Constitutional: Alert and oriented.  Eyes: Conjunctivae are normal.  ENT      Head: Normocephalic and atraumatic.      Nose: No congestion/rhinnorhea.      Mouth/Throat: Mucous membranes are moist.      Neck: No stridor. Hematological/Lymphatic/Immunilogical: No cervical lymphadenopathy. Cardiovascular: Normal rate, regular rhythm.  No murmurs, rubs, or gallops.  Respiratory: Normal respiratory effort without tachypnea nor retractions. Breath sounds are clear and equal bilaterally. No wheezes/rales/rhonchi. Gastrointestinal: Soft and non tender. No rebound. No guarding.  Genitourinary: Deferred Musculoskeletal:  s/p left bka. Neurologic:  Normal speech and language. No gross focal neurologic deficits are appreciated.  Skin:  Surgical incision site in the left knee with dehiscence of roughly 1 cm width along 4 cm of the incision. No erythema. No discharge.  No pus.  Psychiatric: Mood and affect are normal. Speech and behavior are normal. Patient exhibits appropriate insight and judgment.  ____________________________________________    LABS (pertinent positives/negatives)  None  ____________________________________________   EKG  None  ____________________________________________    RADIOLOGY  None  ____________________________________________   PROCEDURES  Procedures  ____________________________________________   INITIAL IMPRESSION / ASSESSMENT AND PLAN / ED COURSE  Pertinent labs & imaging results that were available during my care of the patient were reviewed by me and considered in my medical decision making (see chart for details).   Patient presented to the emergency department today from [facility] because of concerns for surgical wound dehiscence.  On exam he does have some dehiscence to his incision site for his recent left BKA.  However no signs of infection at this time.  Patient is not in pain.  Will redress wound and have patient follow-up with vascular surgery clinic.  ____________________________________________   FINAL CLINICAL IMPRESSION(S) / ED DIAGNOSES  Final diagnoses:  Wound dehiscence     Note: This dictation was prepared with Dragon dictation. Any transcriptional errors that result from this process are unintentional     Phineas SemenGoodman, Jerrilynn Mikowski, MD 01/05/21 [phone]

## 2021-01-05 NOTE — ED Notes (Signed)
This RN attempted to call report to [facility] with no answer.

## 2021-01-25 ENCOUNTER — Ambulatory Visit (INDEPENDENT_AMBULATORY_CARE_PROVIDER_SITE_OTHER): Payer: BLUE CROSS/BLUE SHIELD | Admitting: Vascular Surgery

## 2021-01-25 DIAGNOSIS — Z89512 Acquired absence of left leg below knee: Secondary | ICD-10-CM | POA: Insufficient documentation

## 2021-01-25 NOTE — Progress Notes (Deleted)
Patient ID: Adrian Getman., male   DOB: April 06, 1961, 60 y.o.   MRN: [id number]  No chief complaint on file.   HPI Adrian Behnken. is a 60 y.o. male.    Patient returns to the office status post left below-knee amputation performed 12/23/2020.   Past Medical History:  Diagnosis Date  . Anemia   . Asthma   . Bipolar disorder (HCC)   . Charcot foot due to diabetes mellitus (HCC)   . Depression   . Dermatitis seborrheica   . Diabetes mellitus without complication (HCC)   . Eczema   . Hyperlipemia   . Hypertension   . Hypothyroidism   . Neuropathy   . Psoriasis   . Psychosis (HCC)   . Respiratory failure (HCC)   . Sleep apnea, obstructive   . Stroke [facility])    left hand weakness    Past Surgical History:  Procedure Laterality Date  . AMPUTATION Left 12/23/2020   Procedure: AMPUTATION BELOW KNEE;  Surgeon: Renford Dills, MD;  Location: [facility] ORS;  Service: Vascular;  Laterality: Left;  . BONE EXCISION Left 09/21/2018   Procedure: Partial excision of metatarsal bone;  Surgeon: Gwyneth Revels, DPM;  Location: [facility] ORS;  Service: Podiatry;  Laterality: Left;  . BONE EXOSTOSIS EXCISION Left 05/11/2018   Procedure: Excisional Debridment ulcer left foot;  Surgeon: Gwyneth Revels, DPM;  Location: [facility] ORS;  Service: Podiatry;  Laterality: Left;  . bone removed     bone removed in foot due to infection   . TONSILLECTOMY        No Known Allergies  Current Outpatient Medications  Medication Sig Dispense Refill  . acetaminophen (TYLENOL) 325 MG tablet Take 650 mg by mouth every 4 (four) hours as needed for mild pain or fever.     Marland Kitchen albuterol (PROVENTIL HFA;VENTOLIN HFA) 108 (90 Base) MCG/ACT inhaler Inhale 2 puffs into the lungs every 6 (six) hours as needed for wheezing or shortness of breath.    Marland Kitchen alum & mag hydroxide-simeth (MAALOX/MYLANTA) 200-200-20 MG/5ML suspension Take 30 mLs by mouth as needed for indigestion or heartburn.     Marland Kitchen amoxicillin-clavulanate  (AUGMENTIN) 875-125 MG tablet Take 1 tablet by mouth every 12 (twelve) hours. 14 tablet 0  . atorvastatin (LIPITOR) 40 MG tablet Take 40 mg by mouth at bedtime.     . Azelastine HCl 137 MCG/SPRAY SOLN Place 1 spray into both nostrils 2 (two) times daily. (0800 & 2000) Use in each nostril as directed    . bacitracin ointment Apply topically daily. 120 g 0  . bismuth subsalicylate (PEPTO BISMOL) 262 MG/15ML suspension Take 30 mLs by mouth every 6 (six) hours as needed for indigestion.     . cholecalciferol (VITAMIN D) 1000 units tablet Take 1,000 Units by mouth daily. (0800)    . doxycycline (VIBRA-TABS) 100 MG tablet Take 1 tablet (100 mg total) by mouth every 12 (twelve) hours. 14 tablet 0  . Ensure Max Protein (ENSURE MAX PROTEIN) LIQD Take 330 mLs (11 oz total) by mouth 2 (two) times daily between meals.    . ferrous sulfate 325 (65 FE) MG tablet Take 325 mg by mouth 2 (two) times daily with a meal. (0800 & 2000)    . fluticasone furoate-vilanterol (BREO ELLIPTA) 100-25 MCG/INH AEPB Inhale 1 puff into the lungs daily. (0800)    . gabapentin (NEURONTIN) 100 MG capsule Take 1 capsule (100 mg total) by mouth 3 (three) times daily.    Marland Kitchen levothyroxine (  SYNTHROID, LEVOTHROID) 75 MCG tablet Take 75 mcg by mouth daily before breakfast. (0700)    . loperamide (IMODIUM A-D) 2 MG tablet Take 2-4 mg by mouth 4 (four) times daily as needed for diarrhea or loose stools.     . magnesium hydroxide (MILK OF MAGNESIA) 400 MG/5ML suspension Take 15-30 mLs by mouth daily as needed for mild constipation.     . montelukast (SINGULAIR) 10 MG tablet Take 10 mg by mouth daily. (0800)    . Multiple Vitamin (THEREMS PO) Take 1 tablet by mouth daily. (0800)    . nystatin (MYCOSTATIN/NYSTOP) powder Apply 1 g topically 2 (two) times daily as needed (FOR FUNGAL RASH). Under abdomen/groin    . oxcarbazepine (TRILEPTAL) 600 MG tablet Take 600 mg by mouth 2 (two) times daily. (0800 & 2000)    . oxyCODONE-acetaminophen  (PERCOCET/ROXICET) 5-325 MG tablet Take 1-2 tablets by mouth every 4 (four) hours as needed for moderate pain or severe pain. 12 tablet 0  . pantoprazole (PROTONIX) 40 MG tablet Take 1 tablet (40 mg total) by mouth daily at 12 noon. 30 tablet 0  . senna (SENOKOT) 8.6 MG tablet Take 1 tablet by mouth at bedtime as needed for constipation.    . tamsulosin (FLOMAX) 0.4 MG CAPS capsule Take 1 capsule (0.4 mg total) by mouth daily after supper. 30 capsule   . Tiotropium Bromide Monohydrate 2.5 MCG/ACT AERS Inhale 1 puff into the lungs daily. (0800)    . triamcinolone cream (KENALOG) 0.1 % Apply 1 application topically 2 (two) times daily as needed (until rash clears). (0800 & 2000)    . ziprasidone (GEODON) 40 MG capsule Take 40 mg by mouth 2 (two) times daily with a meal. (0800 & 2000)  3  . zolpidem (AMBIEN) 5 MG tablet Take 5 mg by mouth at bedtime.     No current facility-administered medications for this visit.        Physical Exam There were no vitals taken for this visit. Gen:  WD/WN, NAD Skin: incision C/D/I     Assessment/Plan:      Levora Dredge 01/25/2021, 12:07 PM   This note was created with Dragon medical transcription system.  Any errors from dictation are unintentional.

## 2021-02-01 ENCOUNTER — Encounter (INDEPENDENT_AMBULATORY_CARE_PROVIDER_SITE_OTHER): Payer: Self-pay | Admitting: Vascular Surgery

## 2021-02-01 ENCOUNTER — Ambulatory Visit (INDEPENDENT_AMBULATORY_CARE_PROVIDER_SITE_OTHER): Payer: Medicare Other | Admitting: Vascular Surgery

## 2021-02-01 ENCOUNTER — Other Ambulatory Visit: Payer: Self-pay

## 2021-02-01 VITALS — BP 145/86 | HR 77 | Ht 71.0 in | Wt 240.0 lb

## 2021-02-01 DIAGNOSIS — M86172 Other acute osteomyelitis, left ankle and foot: Secondary | ICD-10-CM

## 2021-02-01 NOTE — Progress Notes (Signed)
Patient ID: Adrian Sanzone., male   DOB: [birth year], 60 y.o.   MRN: [id number]  No chief complaint on file.   HPI Adrian Conery. is a 60 y.o. male.    Patient returns for his first postop visit status post left below the knee amputation on December 23, 2020  He denies pain  Past Medical History:  Diagnosis Date  . Anemia   . Asthma   . Bipolar disorder (HCC)   . Charcot foot due to diabetes mellitus (HCC)   . Depression   . Dermatitis seborrheica   . Diabetes mellitus without complication (HCC)   . Eczema   . Hyperlipemia   . Hypertension   . Hypothyroidism   . Neuropathy   . Psoriasis   . Psychosis (HCC)   . Respiratory failure (HCC)   . Sleep apnea, obstructive   . Stroke [facility])    left hand weakness    Past Surgical History:  Procedure Laterality Date  . AMPUTATION Left 12/23/2020   Procedure: AMPUTATION BELOW KNEE;  Surgeon: Renford Dills, MD;  Location: [facility] ORS;  Service: Vascular;  Laterality: Left;  . BONE EXCISION Left 09/21/2018   Procedure: Partial excision of metatarsal bone;  Surgeon: Gwyneth Revels, DPM;  Location: [facility] ORS;  Service: Podiatry;  Laterality: Left;  . BONE EXOSTOSIS EXCISION Left 05/11/2018   Procedure: Excisional Debridment ulcer left foot;  Surgeon: Gwyneth Revels, DPM;  Location: [facility] ORS;  Service: Podiatry;  Laterality: Left;  . bone removed     bone removed in foot due to infection   . TONSILLECTOMY        No Known Allergies  Current Outpatient Medications  Medication Sig Dispense Refill  . acetaminophen (TYLENOL) 325 MG tablet Take 650 mg by mouth every 4 (four) hours as needed for mild pain or fever.     Marland Kitchen albuterol (PROVENTIL HFA;VENTOLIN HFA) 108 (90 Base) MCG/ACT inhaler Inhale 2 puffs into the lungs every 6 (six) hours as needed for wheezing or shortness of breath.    Marland Kitchen alum & mag hydroxide-simeth (MAALOX/MYLANTA) 200-200-20 MG/5ML suspension Take 30 mLs by mouth as needed for indigestion or heartburn.     Marland Kitchen  amoxicillin-clavulanate (AUGMENTIN) 875-125 MG tablet Take 1 tablet by mouth every 12 (twelve) hours. 14 tablet 0  . atorvastatin (LIPITOR) 40 MG tablet Take 40 mg by mouth at bedtime.     . Azelastine HCl 137 MCG/SPRAY SOLN Place 1 spray into both nostrils 2 (two) times daily. (0800 & 2000) Use in each nostril as directed    . bacitracin ointment Apply topically daily. 120 g 0  . bismuth subsalicylate (PEPTO BISMOL) 262 MG/15ML suspension Take 30 mLs by mouth every 6 (six) hours as needed for indigestion.     . cholecalciferol (VITAMIN D) 1000 units tablet Take 1,000 Units by mouth daily. (0800)    . doxycycline (VIBRA-TABS) 100 MG tablet Take 1 tablet (100 mg total) by mouth every 12 (twelve) hours. 14 tablet 0  . Ensure Max Protein (ENSURE MAX PROTEIN) LIQD Take 330 mLs (11 oz total) by mouth 2 (two) times daily between meals.    . ferrous sulfate 325 (65 FE) MG tablet Take 325 mg by mouth 2 (two) times daily with a meal. (0800 & 2000)    . fluticasone furoate-vilanterol (BREO ELLIPTA) 100-25 MCG/INH AEPB Inhale 1 puff into the lungs daily. (0800)    . gabapentin (NEURONTIN) 100 MG capsule Take 1 capsule (100 mg total) by mouth  3 (three) times daily.    Marland Kitchen levothyroxine (SYNTHROID, LEVOTHROID) 75 MCG tablet Take 75 mcg by mouth daily before breakfast. (0700)    . loperamide (IMODIUM A-D) 2 MG tablet Take 2-4 mg by mouth 4 (four) times daily as needed for diarrhea or loose stools.     . magnesium hydroxide (MILK OF MAGNESIA) 400 MG/5ML suspension Take 15-30 mLs by mouth daily as needed for mild constipation.     . montelukast (SINGULAIR) 10 MG tablet Take 10 mg by mouth daily. (0800)    . Multiple Vitamin (THEREMS PO) Take 1 tablet by mouth daily. (0800)    . nystatin (MYCOSTATIN/NYSTOP) powder Apply 1 g topically 2 (two) times daily as needed (FOR FUNGAL RASH). Under abdomen/groin    . oxcarbazepine (TRILEPTAL) 600 MG tablet Take 600 mg by mouth 2 (two) times daily. (0800 & 2000)    .  oxyCODONE-acetaminophen (PERCOCET/ROXICET) 5-325 MG tablet Take 1-2 tablets by mouth every 4 (four) hours as needed for moderate pain or severe pain. 12 tablet 0  . pantoprazole (PROTONIX) 40 MG tablet Take 1 tablet (40 mg total) by mouth daily at 12 noon. 30 tablet 0  . senna (SENOKOT) 8.6 MG tablet Take 1 tablet by mouth at bedtime as needed for constipation.    . tamsulosin (FLOMAX) 0.4 MG CAPS capsule Take 1 capsule (0.4 mg total) by mouth daily after supper. 30 capsule   . Tiotropium Bromide Monohydrate 2.5 MCG/ACT AERS Inhale 1 puff into the lungs daily. (0800)    . triamcinolone cream (KENALOG) 0.1 % Apply 1 application topically 2 (two) times daily as needed (until rash clears). (0800 & 2000)    . ziprasidone (GEODON) 40 MG capsule Take 40 mg by mouth 2 (two) times daily with a meal. (0800 & 2000)  3  . zolpidem (AMBIEN) 5 MG tablet Take 5 mg by mouth at bedtime.     No current facility-administered medications for this visit.        Physical Exam There were no vitals taken for this visit. Gen:  WD/WN, NAD Skin: incision C/D/I     Assessment/Plan: 1. Other acute osteomyelitis of left foot (HCC) Wound looks good  I will dress the small area with silvadene and plan for prosthesis in early May      Loman Logan 02/01/2021, 1:08 PM   This note was created with Dragon medical transcription system.  Any errors from dictation are unintentional.

## 2021-05-03 ENCOUNTER — Other Ambulatory Visit: Payer: Self-pay

## 2021-05-03 ENCOUNTER — Encounter (INDEPENDENT_AMBULATORY_CARE_PROVIDER_SITE_OTHER): Payer: Self-pay | Admitting: Vascular Surgery

## 2021-05-03 ENCOUNTER — Ambulatory Visit (INDEPENDENT_AMBULATORY_CARE_PROVIDER_SITE_OTHER): Payer: Medicare Other | Admitting: Vascular Surgery

## 2021-05-03 VITALS — BP 134/88 | HR 112 | Resp 18 | Ht 71.0 in | Wt 240.0 lb

## 2021-05-03 DIAGNOSIS — I10 Essential (primary) hypertension: Secondary | ICD-10-CM | POA: Diagnosis not present

## 2021-05-03 DIAGNOSIS — E119 Type 2 diabetes mellitus without complications: Secondary | ICD-10-CM

## 2021-05-03 DIAGNOSIS — J4540 Moderate persistent asthma, uncomplicated: Secondary | ICD-10-CM

## 2021-05-03 DIAGNOSIS — T879 Unspecified complications of amputation stump: Secondary | ICD-10-CM

## 2021-05-03 DIAGNOSIS — E785 Hyperlipidemia, unspecified: Secondary | ICD-10-CM

## 2021-05-05 ENCOUNTER — Encounter (INDEPENDENT_AMBULATORY_CARE_PROVIDER_SITE_OTHER): Payer: Self-pay | Admitting: Vascular Surgery

## 2021-05-05 DIAGNOSIS — T879 Unspecified complications of amputation stump: Secondary | ICD-10-CM | POA: Insufficient documentation

## 2021-05-05 NOTE — Progress Notes (Signed)
MRN : [id number]  Adrian [name]. is a 60 y.o. (Mar 06, 1961) male who presents with chief complaint of  Chief Complaint  Patient presents with   Follow-up    Consult for left stump wound drainage  .  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  Apparently, he placed a shrinker sock on his stump and it remained in place for several weeks without changes.  At which point it was noticed that there was what appeared to be pus and some irritation of the skin.  At the time of today's visit the patient denies large volumes of drainage.  He denies pain at the amputation site.  No documented fevers or shaking chills  Current Meds  Medication Sig   acetaminophen (TYLENOL) 325 MG tablet Take 650 mg by mouth every 4 (four) hours as needed for mild pain or fever.    albuterol (PROVENTIL HFA;VENTOLIN HFA) 108 (90 Base) MCG/ACT inhaler Inhale 2 puffs into the lungs every 6 (six) hours as needed for wheezing or shortness of breath.   amoxicillin-clavulanate (AUGMENTIN) 875-125 MG tablet Take 1 tablet by mouth every 12 (twelve) hours.   atorvastatin (LIPITOR) 40 MG tablet Take 40 mg by mouth at bedtime.    Azelastine HCl 137 MCG/SPRAY SOLN Place 1 spray into both nostrils 2 (two) times daily. (0800 & 2000) Use in each nostril as directed   cholecalciferol (VITAMIN D) 1000 units tablet Take 1,000 Units by mouth daily. (0800)   Ensure Max Protein (ENSURE MAX PROTEIN) LIQD Take 330 mLs (11 oz total) by mouth 2 (two) times daily between meals.   ferrous sulfate 325 (65 FE) MG tablet Take 325 mg by mouth 2 (two) times daily with a meal. (0800 & 2000)   fluticasone furoate-vilanterol (BREO ELLIPTA) 100-25 MCG/INH AEPB Inhale 1 puff into the lungs daily. (0800)   gabapentin (NEURONTIN) 100 MG capsule Take 1 capsule (100 mg total) by mouth 3 (three) times daily.   levothyroxine (SYNTHROID, LEVOTHROID) 75 MCG tablet Take 75  mcg by mouth daily before breakfast. (0700)   montelukast (SINGULAIR) 10 MG tablet Take 10 mg by mouth daily. (0800)   Multiple Vitamin (THEREMS PO) Take 1 tablet by mouth daily. (0800)   nystatin (MYCOSTATIN/NYSTOP) powder Apply 1 g topically 2 (two) times daily as needed (FOR FUNGAL RASH). Under abdomen/groin   oxcarbazepine (TRILEPTAL) 600 MG tablet Take 600 mg by mouth 2 (two) times daily. (0800 & 2000)   pantoprazole (PROTONIX) 40 MG tablet Take 1 tablet (40 mg total) by mouth daily at 12 noon.   senna (SENOKOT) 8.6 MG tablet Take 1 tablet by mouth at bedtime as needed for constipation.   tamsulosin (FLOMAX) 0.4 MG CAPS capsule Take 1 capsule (0.4 mg total) by mouth daily after supper.   Tiotropium Bromide Monohydrate 2.5 MCG/ACT AERS Inhale 1 puff into the lungs daily. (0800)   triamcinolone cream (KENALOG) 0.1 % Apply 1 application topically 2 (two) times daily as needed (until rash clears). (0800 & 2000)   ziprasidone (GEODON) 40 MG capsule Take 40 mg by mouth 2 (two) times daily with a meal. (0800 & 2000)   zolpidem (AMBIEN) 5 MG tablet Take 5 mg by mouth at bedtime.    Past Medical History:  Diagnosis Date   Anemia    Asthma    Bipolar disorder (HCC)    Charcot foot due to diabetes mellitus (HCC)  Depression    Dermatitis seborrheica    Diabetes mellitus without complication (HCC)    Eczema    Hyperlipemia    Hypertension    Hypothyroidism    Neuropathy    Psoriasis    Psychosis (HCC)    Respiratory failure (HCC)    Sleep apnea, obstructive    Stroke [facility])    left hand weakness    Past Surgical History:  Procedure Laterality Date   AMPUTATION Left 12/23/2020   Procedure: AMPUTATION BELOW KNEE;  Surgeon: Renford Dills, MD;  Location: [facility] ORS;  Service: Vascular;  Laterality: Left;   BONE EXCISION Left 09/21/2018   Procedure: Partial excision of metatarsal bone;  Surgeon: Gwyneth Revels, DPM;  Location: [facility] ORS;  Service: Podiatry;  Laterality: Left;   BONE  EXOSTOSIS EXCISION Left 05/11/2018   Procedure: Excisional Debridment ulcer left foot;  Surgeon: Gwyneth Revels, DPM;  Location: [facility] ORS;  Service: Podiatry;  Laterality: Left;   bone removed     bone removed in foot due to infection    TONSILLECTOMY      Social History Social History   Tobacco Use   Smoking status: Former    Packs/day: 0.25    Types: Cigarettes    Quit date: 03/03/2018    Years since quitting: 3.1   Smokeless tobacco: Never  Vaping Use   Vaping Use: Never used  Substance Use Topics   Alcohol use: No   Drug use: No    Family History Family History  Problem Relation Age of Onset   Heart failure Mother    Stroke Father     No Known Allergies   REVIEW OF SYSTEMS (Negative unless checked)  Constitutional: [] Weight loss  [] Fever  [] Chills Cardiac: [] Chest pain   [] Chest pressure   [] Palpitations   [] Shortness of breath when laying flat   [] Shortness of breath with exertion. Vascular:  [] Pain in legs with walking   [] Pain in legs at rest  [] History of DVT   [] Phlebitis   [] Swelling in legs   [] Varicose veins   [] Non-healing ulcers Pulmonary:   [] Uses home oxygen   [] Productive cough   [] Hemoptysis   [] Wheeze  [] COPD   [] Asthma Neurologic:  [] Dizziness   [] Seizures   [] History of stroke   [] History of TIA  [] Aphasia   [] Vissual changes   [] Weakness or numbness in arm   [] Weakness or numbness in leg Musculoskeletal:   [] Joint swelling   [] Joint pain   [] Low back pain Hematologic:  [] Easy bruising  [] Easy bleeding   [] Hypercoagulable state   [] Anemic Gastrointestinal:  [] Diarrhea   [] Vomiting  [] Gastroesophageal reflux/heartburn   [] Difficulty swallowing. Genitourinary:  [] Chronic kidney disease   [] Difficult urination  [] Frequent urination   [] Blood in urine Skin:  [] Rashes   [] Ulcers  Psychological:  [] History of anxiety   []  History of major depression.  Physical Examination  Vitals:   05/03/21 1531  BP: 134/88  Pulse: (!) 112  Resp: 18  Weight: 240 lb  (108.9 kg)  Height: 5\' 11"  (1.803 m)   Body mass index is 33.47 kg/m. Gen: WD/WN, NAD Head: [place]/AT, No temporalis wasting.  Ear/Nose/Throat: Hearing grossly intact, nares w/o erythema or drainage Eyes: PER, EOMI, sclera nonicteric.  Neck: Supple, no large masses.   Pulmonary:  Good air movement, no audible wheezing bilaterally, no use of accessory muscles.  Cardiac: RRR, no JVD Vascular:   Left below-knee amputation stump has severe venous stasis changes to the skin these are unchanged and were  present at the time of amputation.  There is certainly some excoriation and maceration of the skin consistent with prolonged exposure to excessive moisture.  There are is no purulent drainage identified.  The suture line is actually intact.  There is no erythema induration tenderness or fluctuance. Vessel Right Left  Radial Palpable Palpable  Gastrointestinal: Non-distended. No guarding/no peritoneal signs.  Musculoskeletal: M/S 5/5 throughout.  No deformity or atrophy.  Neurologic: CN 2-12 intact. Symmetrical.  Speech is fluent. Motor exam as listed above. Psychiatric: Judgment intact, Mood & affect appropriate for pt's clinical situation. Dermatologic: severe venous rashes no ulcers noted.  No changes consistent with cellulitis.   CBC Lab Results  Component Value Date   WBC 11.7 (H) 12/30/2020   HGB 7.7 (L) 12/30/2020   HCT 23.2 (L) 12/30/2020   MCV 90.6 12/30/2020   PLT 295 12/30/2020    BMET    Component Value Date/Time   NA 140 12/29/2020 0522   NA 143 04/26/2014 0309   K 3.5 12/29/2020 0522   K 4.1 04/26/2014 0309   CL 104 12/29/2020 0522   CL 106 04/26/2014 0309   CO2 26 12/29/2020 0522   CO2 27 04/26/2014 0309   GLUCOSE 108 (H) 12/29/2020 0522   GLUCOSE 100 (H) 04/26/2014 0309   BUN 33 (H) 12/29/2020 0522   BUN 11 04/26/2014 0309   CREATININE 0.88 12/29/2020 0522   CREATININE 1.08 04/26/2014 0309   CALCIUM 8.5 (L) 12/29/2020 0522   CALCIUM 8.5 04/26/2014 0309    GFRNONAA >60 12/29/2020 0522   GFRNONAA >60 04/26/2014 0309   GFRAA >60 09/19/2018 1156   GFRAA >60 04/26/2014 0309   CrCl cannot be calculated (Patient's most recent lab result is older than the maximum 21 days allowed.).  COAG Lab Results  Component Value Date   INR 1.1 12/18/2020   INR 0.97 03/06/2018   INR 1.40 10/09/2015    Radiology No results found.   Assessment/Plan 1. BKA stump complication (HCC) On inspection his stump appears to be more of a maceration secondary to exposure to excessive moisture than an overt infection.  We will dress his stump with a ABD and Curlex followed by an Ace wrap and I would recommend that he do this on a daily basis for the next 2 weeks.  I will see him back at that time.  We also discussed that the shrinker should go on in the early morning but be removed at suppertime and that it should not remain in place for days on end.  I will reevaluate in 2 weeks at which time he should be cleared to begin using his prosthesis.  2. Essential hypertension, benign Continue antihypertensive medications as already ordered, these medications have been reviewed and there are no changes at this time.   3. Moderate persistent chronic asthma without complication Continue pulmonary medications and aerosols as already ordered, these medications have been reviewed and there are no changes at this time.    4. Type 2 diabetes mellitus without complication, without long-term current use of insulin (HCC) Continue hypoglycemic medications as already ordered, these medications have been reviewed and there are no changes at this time.  Hgb A1C to be monitored as already arranged by primary service   5. Hyperlipidemia, unspecified hyperlipidemia type Continue statin as ordered and reviewed, no changes at this time     Levora Dredge, MD  05/05/2021 2:18 PM

## 2021-05-17 ENCOUNTER — Ambulatory Visit (INDEPENDENT_AMBULATORY_CARE_PROVIDER_SITE_OTHER): Payer: BLUE CROSS/BLUE SHIELD | Admitting: Vascular Surgery

## 2021-05-17 ENCOUNTER — Encounter (INDEPENDENT_AMBULATORY_CARE_PROVIDER_SITE_OTHER): Payer: Self-pay | Admitting: Vascular Surgery

## 2021-08-25 NOTE — Progress Notes (Deleted)
MRN : [id number]  Adrian [name]. is a 60 y.o. ([birth year]) male who presents with chief complaint of ***.  History of Present Illness: ***  No outpatient medications have been marked as taking for the 08/26/21 encounter (Appointment) with Delana Meyer, Dolores Lory, MD.    Past Medical History:  Diagnosis Date   Anemia    Asthma    Bipolar disorder ([place])    Charcot foot due to diabetes mellitus ([place])    Depression    Dermatitis seborrheica    Diabetes mellitus without complication ([place])    Eczema    Hyperlipemia    Hypertension    Hypothyroidism    Neuropathy    Psoriasis    Psychosis ([place])    Respiratory failure ([place])    Sleep apnea, obstructive    Stroke ([place])    left hand weakness    Past Surgical History:  Procedure Laterality Date   AMPUTATION Left 12/23/2020   Procedure: AMPUTATION BELOW KNEE;  Surgeon: Katha Cabal, MD;  Location: [facility] ORS;  Service: Vascular;  Laterality: Left;   BONE EXCISION Left 09/21/2018   Procedure: Partial excision of metatarsal bone;  Surgeon: Samara Deist, DPM;  Location: [facility] ORS;  Service: Podiatry;  Laterality: Left;   BONE EXOSTOSIS EXCISION Left 05/11/2018   Procedure: Excisional Debridment ulcer left foot;  Surgeon: Samara Deist, DPM;  Location: [facility] ORS;  Service: Podiatry;  Laterality: Left;   bone removed     bone removed in foot due to infection    TONSILLECTOMY      Social History Social History   Tobacco Use   Smoking status: Former    Packs/day: 0.25    Types: Cigarettes    Quit date: 03/03/2018    Years since quitting: 3.4   Smokeless tobacco: Never  Vaping Use   Vaping Use: Never used  Substance Use Topics   Alcohol use: No   Drug use: No    Family History Family History  Problem Relation Age of Onset   Heart failure Mother    Stroke Father     No Known Allergies   REVIEW OF SYSTEMS (Negative unless checked)  Constitutional: [] Weight loss  [] Fever  [] Chills Cardiac: [] Chest pain   [] Chest  pressure   [] Palpitations   [] Shortness of breath when laying flat   [] Shortness of breath with exertion. Vascular:  [] Pain in legs with walking   [] Pain in legs at rest  [] History of DVT   [] Phlebitis   [] Swelling in legs   [] Varicose veins   [] Non-healing ulcers Pulmonary:   [] Uses home oxygen   [] Productive cough   [] Hemoptysis   [] Wheeze  [] COPD   [] Asthma Neurologic:  [] Dizziness   [] Seizures   [] History of stroke   [] History of TIA  [] Aphasia   [] Vissual changes   [] Weakness or numbness in arm   [] Weakness or numbness in leg Musculoskeletal:   [] Joint swelling   [] Joint pain   [] Low back pain Hematologic:  [] Easy bruising  [] Easy bleeding   [] Hypercoagulable state   [] Anemic Gastrointestinal:  [] Diarrhea   [] Vomiting  [] Gastroesophageal reflux/heartburn   [] Difficulty swallowing. Genitourinary:  [] Chronic kidney disease   [] Difficult urination  [] Frequent urination   [] Blood in urine Skin:  [] Rashes   [] Ulcers  Psychological:  [] History of anxiety   []  History of major depression.  Physical Examination  There were no vitals filed for this visit. There is no height or weight on file to calculate BMI. Gen: WD/WN, NAD Head:  [place]/AT, No temporalis wasting.  Ear/Nose/Throat: Hearing grossly intact, nares w/o erythema or drainage Eyes: PER, EOMI, sclera nonicteric.  Neck: Supple, no masses.  No bruit or JVD.  Pulmonary:  Good air movement, no audible wheezing, no use of accessory muscles.  Cardiac: RRR, normal S1, S2, no Murmurs. Vascular:  *** Vessel Right Left  Radial Palpable Palpable  Carotid Palpable Palpable  PT Palpable Palpable  DP Palpable Palpable  Gastrointestinal: soft, non-distended. No guarding/no peritoneal signs.  Musculoskeletal: M/S 5/5 throughout.  No visible deformity.  Neurologic: CN 2-12 intact. Pain and light touch intact in extremities.  Symmetrical.  Speech is fluent. Motor exam as listed above. Psychiatric: Judgment intact, Mood & affect appropriate for pt's  clinical situation. Dermatologic: No rashes or ulcers noted.  No changes consistent with cellulitis.   CBC Lab Results  Component Value Date   WBC 11.7 (H) 12/30/2020   HGB 7.7 (L) 12/30/2020   HCT 23.2 (L) 12/30/2020   MCV 90.6 12/30/2020   PLT 295 12/30/2020    BMET    Component Value Date/Time   NA 140 12/29/2020 0522   NA 143 04/26/2014 0309   K 3.5 12/29/2020 0522   K 4.1 04/26/2014 0309   CL 104 12/29/2020 0522   CL 106 04/26/2014 0309   CO2 26 12/29/2020 0522   CO2 27 04/26/2014 0309   GLUCOSE 108 (H) 12/29/2020 0522   GLUCOSE 100 (H) 04/26/2014 0309   BUN 33 (H) 12/29/2020 0522   BUN 11 04/26/2014 0309   CREATININE 0.88 12/29/2020 0522   CREATININE 1.08 04/26/2014 0309   CALCIUM 8.5 (L) 12/29/2020 0522   CALCIUM 8.5 04/26/2014 0309   GFRNONAA >60 12/29/2020 0522   GFRNONAA >60 04/26/2014 0309   GFRAA >60 09/19/2018 1156   GFRAA >60 04/26/2014 0309   CrCl cannot be calculated (Patient's most recent lab result is older than the maximum 21 days allowed.).  COAG Lab Results  Component Value Date   INR 1.1 12/18/2020   INR 0.97 03/06/2018   INR 1.40 10/09/2015    Radiology No results found.   Assessment/Plan    Levora Dredge, MD  08/25/2021 9:53 AM

## 2021-08-26 ENCOUNTER — Ambulatory Visit (INDEPENDENT_AMBULATORY_CARE_PROVIDER_SITE_OTHER): Payer: BLUE CROSS/BLUE SHIELD | Admitting: Vascular Surgery

## 2021-08-26 DIAGNOSIS — I10 Essential (primary) hypertension: Secondary | ICD-10-CM

## 2021-08-26 DIAGNOSIS — J449 Chronic obstructive pulmonary disease, unspecified: Secondary | ICD-10-CM

## 2021-08-26 DIAGNOSIS — T879 Unspecified complications of amputation stump: Secondary | ICD-10-CM

## 2021-08-26 DIAGNOSIS — E119 Type 2 diabetes mellitus without complications: Secondary | ICD-10-CM

## 2021-08-30 ENCOUNTER — Other Ambulatory Visit: Payer: Self-pay

## 2021-08-30 ENCOUNTER — Ambulatory Visit (INDEPENDENT_AMBULATORY_CARE_PROVIDER_SITE_OTHER): Payer: Medicare Other | Admitting: Vascular Surgery

## 2021-08-30 ENCOUNTER — Encounter (INDEPENDENT_AMBULATORY_CARE_PROVIDER_SITE_OTHER): Payer: Self-pay | Admitting: Vascular Surgery

## 2021-08-30 VITALS — BP 137/93 | HR 102 | Ht 71.0 in | Wt 280.0 lb

## 2021-08-30 DIAGNOSIS — E119 Type 2 diabetes mellitus without complications: Secondary | ICD-10-CM

## 2021-08-30 DIAGNOSIS — Z89512 Acquired absence of left leg below knee: Secondary | ICD-10-CM | POA: Diagnosis not present

## 2021-08-30 DIAGNOSIS — E785 Hyperlipidemia, unspecified: Secondary | ICD-10-CM | POA: Diagnosis not present

## 2021-08-30 DIAGNOSIS — I10 Essential (primary) hypertension: Secondary | ICD-10-CM | POA: Diagnosis not present

## 2021-08-30 NOTE — Progress Notes (Signed)
MRN : [id number]  Adrian [name]. is a 60 y.o. (1961/02/17) male who presents with chief complaint of  Chief Complaint  Patient presents with   Follow-up    Dr.Menz / Dr. Gilda Crease - Stump check  .  History of Present Illness:   Patient returns to the office for follow-up evaluation of his right below-knee amputation.  He states that he is not having any significant pain.  He actually does not seem to be having much in the way of phantom pain either.  He is unaware of any issues or problems.  Current Meds  Medication Sig   acetaminophen (TYLENOL) 325 MG tablet Take 650 mg by mouth every 4 (four) hours as needed for mild pain or fever.    albuterol (PROVENTIL HFA;VENTOLIN HFA) 108 (90 Base) MCG/ACT inhaler Inhale 2 puffs into the lungs every 6 (six) hours as needed for wheezing or shortness of breath.   alum & mag hydroxide-simeth (MAALOX/MYLANTA) 200-200-20 MG/5ML suspension Take 30 mLs by mouth as needed for indigestion or heartburn.    amoxicillin-clavulanate (AUGMENTIN) 875-125 MG tablet Take 1 tablet by mouth every 12 (twelve) hours.   atorvastatin (LIPITOR) 40 MG tablet Take 40 mg by mouth at bedtime.    Azelastine HCl 137 MCG/SPRAY SOLN Place 1 spray into both nostrils 2 (two) times daily. (0800 & 2000) Use in each nostril as directed   bacitracin ointment Apply topically daily.   bismuth subsalicylate (PEPTO BISMOL) 262 MG/15ML suspension Take 30 mLs by mouth every 6 (six) hours as needed for indigestion.   cholecalciferol (VITAMIN D) 1000 units tablet Take 1,000 Units by mouth daily. (0800)   doxycycline (VIBRA-TABS) 100 MG tablet Take 1 tablet (100 mg total) by mouth every 12 (twelve) hours.   Ensure Max Protein (ENSURE MAX PROTEIN) LIQD Take 330 mLs (11 oz total) by mouth 2 (two) times daily between meals.   ferrous sulfate 325 (65 FE) MG tablet Take 325 mg by mouth 2 (two) times daily with a meal. (0800 & 2000)   fluticasone furoate-vilanterol (BREO ELLIPTA) 100-25  MCG/INH AEPB Inhale 1 puff into the lungs daily. (0800)   gabapentin (NEURONTIN) 100 MG capsule Take 1 capsule (100 mg total) by mouth 3 (three) times daily.   levothyroxine (SYNTHROID, LEVOTHROID) 75 MCG tablet Take 75 mcg by mouth daily before breakfast. (0700)   loperamide (IMODIUM A-D) 2 MG tablet Take 2-4 mg by mouth 4 (four) times daily as needed for diarrhea or loose stools.    magnesium hydroxide (MILK OF MAGNESIA) 400 MG/5ML suspension Take 15-30 mLs by mouth daily as needed for mild constipation.   montelukast (SINGULAIR) 10 MG tablet Take 10 mg by mouth daily. (0800)   Multiple Vitamin (THEREMS PO) Take 1 tablet by mouth daily. (0800)   nystatin (MYCOSTATIN/NYSTOP) powder Apply 1 g topically 2 (two) times daily as needed (FOR FUNGAL RASH). Under abdomen/groin   oxcarbazepine (TRILEPTAL) 600 MG tablet Take 600 mg by mouth 2 (two) times daily. (0800 & 2000)   oxyCODONE-acetaminophen (PERCOCET/ROXICET) 5-325 MG tablet Take 1-2 tablets by mouth every 4 (four) hours as needed for moderate pain or severe pain.   pantoprazole (PROTONIX) 40 MG tablet Take 1 tablet (40 mg total) by mouth daily at 12 noon.   senna (SENOKOT) 8.6 MG tablet Take 1 tablet by mouth at bedtime as needed for constipation.   tamsulosin (FLOMAX) 0.4 MG CAPS capsule Take 1 capsule (0.4 mg total) by mouth daily after supper.   Tiotropium Bromide Monohydrate  2.5 MCG/ACT AERS Inhale 1 puff into the lungs daily. (0800)   triamcinolone cream (KENALOG) 0.1 % Apply 1 application topically 2 (two) times daily as needed (until rash clears). (0800 & 2000)   ziprasidone (GEODON) 40 MG capsule Take 40 mg by mouth 2 (two) times daily with a meal. (0800 & 2000)   zolpidem (AMBIEN) 5 MG tablet Take 5 mg by mouth at bedtime.    Past Medical History:  Diagnosis Date   Anemia    Asthma    Bipolar disorder ([place])    Charcot foot due to diabetes mellitus ([place])    Depression    Dermatitis seborrheica    Diabetes mellitus without  complication ([place])    Eczema    Hyperlipemia    Hypertension    Hypothyroidism    Neuropathy    Psoriasis    Psychosis ([place])    Respiratory failure ([place])    Sleep apnea, obstructive    Stroke [facility])    left hand weakness    Past Surgical History:  Procedure Laterality Date   AMPUTATION Left 12/23/2020   Procedure: AMPUTATION BELOW KNEE;  Surgeon: Katha Cabal, MD;  Location: [facility] ORS;  Service: Vascular;  Laterality: Left;   BONE EXCISION Left 09/21/2018   Procedure: Partial excision of metatarsal bone;  Surgeon: Samara Deist, DPM;  Location: [facility] ORS;  Service: Podiatry;  Laterality: Left;   BONE EXOSTOSIS EXCISION Left 05/11/2018   Procedure: Excisional Debridment ulcer left foot;  Surgeon: Samara Deist, DPM;  Location: [facility] ORS;  Service: Podiatry;  Laterality: Left;   bone removed     bone removed in foot due to infection    TONSILLECTOMY      Social History Social History   Tobacco Use   Smoking status: Former    Packs/day: 0.25    Types: Cigarettes    Quit date: 03/03/2018    Years since quitting: 3.4   Smokeless tobacco: Never  Vaping Use   Vaping Use: Never used  Substance Use Topics   Alcohol use: No   Drug use: No    Family History Family History  Problem Relation Age of Onset   Heart failure Mother    Stroke Father     No Known Allergies   REVIEW OF SYSTEMS (Negative unless checked)  Constitutional: [] Weight loss  [] Fever  [] Chills Cardiac: [] Chest pain   [] Chest pressure   [] Palpitations   [] Shortness of breath when laying flat   [] Shortness of breath with exertion. Vascular:  [] Pain in legs with walking   [] Pain in legs at rest  [] History of DVT   [] Phlebitis   [] Swelling in legs   [] Varicose veins   [] Non-healing ulcers Pulmonary:   [] Uses home oxygen   [] Productive cough   [] Hemoptysis   [] Wheeze  [] COPD   [] Asthma Neurologic:  [] Dizziness   [] Seizures   [] History of stroke   [] History of TIA  [] Aphasia   [] Vissual changes   [] Weakness or  numbness in arm   [] Weakness or numbness in leg Musculoskeletal:   [] Joint swelling   [] Joint pain   [] Low back pain Hematologic:  [] Easy bruising  [] Easy bleeding   [] Hypercoagulable state   [] Anemic Gastrointestinal:  [] Diarrhea   [] Vomiting  [] Gastroesophageal reflux/heartburn   [] Difficulty swallowing. Genitourinary:  [] Chronic kidney disease   [] Difficult urination  [] Frequent urination   [] Blood in urine Skin:  [] Rashes   [] Ulcers  Psychological:  [] History of anxiety   []  History of major depression.  Physical Examination  Vitals:   08/30/21 1050  BP: (!) 137/93  Pulse: (!) 102  Weight: 280 lb (127 kg)  Height: 5\' 11"  (1.803 m)   Body mass index is 39.05 kg/m. Gen: WD/WN, NAD Head: [place]/AT, No temporalis wasting.  Ear/Nose/Throat: Hearing grossly intact, nares w/o erythema or drainage Eyes: PER, EOMI, sclera nonicteric.  Neck: Supple, no large masses.   Pulmonary:  Good air movement, no audible wheezing bilaterally, no use of accessory muscles.  Cardiac: RRR, no JVD Vascular: Left below-knee amputation well-healed skin is just a little dry otherwise the soft tissues are supple incisional scar looks just fine Vessel Right Left  Radial Palpable Palpable  Gastrointestinal: Non-distended. No guarding/no peritoneal signs.  Musculoskeletal: M/S 5/5 throughout.  No deformity or atrophy.  Neurologic: CN 2-12 intact. Symmetrical.  Speech is fluent. Motor exam as listed above. Psychiatric: Judgment intact, Mood & affect appropriate for pt's clinical situation. Dermatologic: No rashes or ulcers noted.  No changes consistent with cellulitis. Lymph : No lichenification or skin changes of chronic lymphedema.  CBC Lab Results  Component Value Date   WBC 11.7 (H) 12/30/2020   HGB 7.7 (L) 12/30/2020   HCT 23.2 (L) 12/30/2020   MCV 90.6 12/30/2020   PLT 295 12/30/2020    BMET    Component Value Date/Time   NA 140 12/29/2020 0522   NA 143 04/26/2014 0309   K 3.5 12/29/2020 0522    K 4.1 04/26/2014 0309   CL 104 12/29/2020 0522   CL 106 04/26/2014 0309   CO2 26 12/29/2020 0522   CO2 27 04/26/2014 0309   GLUCOSE 108 (H) 12/29/2020 0522   GLUCOSE 100 (H) 04/26/2014 0309   BUN 33 (H) 12/29/2020 0522   BUN 11 04/26/2014 0309   CREATININE 0.88 12/29/2020 0522   CREATININE 1.08 04/26/2014 0309   CALCIUM 8.5 (L) 12/29/2020 0522   CALCIUM 8.5 04/26/2014 0309   GFRNONAA >60 12/29/2020 0522   GFRNONAA >60 04/26/2014 0309   GFRAA >60 09/19/2018 1156   GFRAA >60 04/26/2014 0309   CrCl cannot be calculated (Patient's most recent lab result is older than the maximum 21 days allowed.).  COAG Lab Results  Component Value Date   INR 1.1 12/18/2020   INR 0.97 03/06/2018   INR 1.40 10/09/2015     Assessment/Plan: 1. S/P BKA (below knee amputation) unilateral, left [facility]) Surgical site is well-healed skin is supple.  We we will move forward with a prosthesis.  I will contact Staci Righter at [facility].  With respect to the dry skin he can use lotion on a routine basis.  I have also recommended he continue to use the shrinker appliance on a daily basis.  2. Hyperlipidemia, unspecified hyperlipidemia type Continue statin as ordered and reviewed, no changes at this time   3. Type 2 diabetes mellitus without complication, without long-term current use of insulin (HCC) Continue hypoglycemic medications as already ordered, these medications have been reviewed and there are no changes at this time.  Hgb A1C to be monitored as already arranged by primary service   4. Primary hypertension Continue antihypertensive medications as already ordered, these medications have been reviewed and there are no changes at this time.     Hortencia Pilar, MD  08/30/2021 11:15 AM

## 2022-04-06 DIAGNOSIS — Z23 Encounter for immunization: Secondary | ICD-10-CM
